# Patient Record
Sex: Male | Born: 1950 | Race: White | Hispanic: No | Smoking: Current every day smoker
Health system: Southern US, Community
[De-identification: ages and names within clinical notes are randomized; demographics above are authoritative.]

## PROBLEM LIST (undated history)

## (undated) DIAGNOSIS — R569 Unspecified convulsions: Secondary | ICD-10-CM

## (undated) DIAGNOSIS — I639 Cerebral infarction, unspecified: Secondary | ICD-10-CM

## (undated) DIAGNOSIS — I10 Essential (primary) hypertension: Secondary | ICD-10-CM

## (undated) DIAGNOSIS — Z9889 Other specified postprocedural states: Secondary | ICD-10-CM

## (undated) DIAGNOSIS — Z87442 Personal history of urinary calculi: Secondary | ICD-10-CM

## (undated) DIAGNOSIS — E119 Type 2 diabetes mellitus without complications: Secondary | ICD-10-CM

## (undated) DIAGNOSIS — E039 Hypothyroidism, unspecified: Secondary | ICD-10-CM

## (undated) DIAGNOSIS — A419 Sepsis, unspecified organism: Secondary | ICD-10-CM

## (undated) DIAGNOSIS — C801 Malignant (primary) neoplasm, unspecified: Secondary | ICD-10-CM

## (undated) DIAGNOSIS — J189 Pneumonia, unspecified organism: Secondary | ICD-10-CM

## (undated) DIAGNOSIS — E7800 Pure hypercholesterolemia, unspecified: Secondary | ICD-10-CM

## (undated) DIAGNOSIS — J449 Chronic obstructive pulmonary disease, unspecified: Secondary | ICD-10-CM

## (undated) DIAGNOSIS — K219 Gastro-esophageal reflux disease without esophagitis: Secondary | ICD-10-CM

## (undated) DIAGNOSIS — Z6841 Body Mass Index (BMI) 40.0 and over, adult: Secondary | ICD-10-CM

## (undated) DIAGNOSIS — F419 Anxiety disorder, unspecified: Secondary | ICD-10-CM

## (undated) HISTORY — PX: TONSILLECTOMY: SUR1361

## (undated) HISTORY — PX: TRACHEOSTOMY: SUR1362

## (undated) HISTORY — PX: GASTROSTOMY TUBE PLACEMENT: SHX655

## (undated) HISTORY — DX: Chronic obstructive pulmonary disease, unspecified: J44.9

## (undated) HISTORY — PX: REMOVAL OF GASTROSTOMY TUBE: SHX6058

## (undated) MED FILL — Dexamethasone Sodium Phosphate Inj 100 MG/10ML: INTRAMUSCULAR | Qty: 1 | Status: AC

---

## 2004-08-10 ENCOUNTER — Emergency Department: Payer: Self-pay | Admitting: Emergency Medicine

## 2004-08-11 ENCOUNTER — Ambulatory Visit: Payer: Self-pay | Admitting: Emergency Medicine

## 2005-05-02 DIAGNOSIS — I639 Cerebral infarction, unspecified: Secondary | ICD-10-CM

## 2005-05-02 HISTORY — DX: Cerebral infarction, unspecified: I63.9

## 2007-03-24 ENCOUNTER — Other Ambulatory Visit: Payer: Self-pay

## 2007-03-24 ENCOUNTER — Emergency Department: Payer: Self-pay | Admitting: Emergency Medicine

## 2009-07-19 ENCOUNTER — Inpatient Hospital Stay: Payer: Self-pay | Admitting: Internal Medicine

## 2013-05-02 DIAGNOSIS — C801 Malignant (primary) neoplasm, unspecified: Secondary | ICD-10-CM

## 2013-05-02 DIAGNOSIS — R569 Unspecified convulsions: Secondary | ICD-10-CM

## 2013-05-02 HISTORY — DX: Malignant (primary) neoplasm, unspecified: C80.1

## 2013-05-02 HISTORY — DX: Unspecified convulsions: R56.9

## 2013-05-02 HISTORY — PX: TONSILLECTOMY: SUR1361

## 2013-12-05 ENCOUNTER — Emergency Department: Payer: Self-pay | Admitting: Emergency Medicine

## 2013-12-05 LAB — CBC
HCT: 46.5 % (ref 40.0–52.0)
HGB: 15.2 g/dL (ref 13.0–18.0)
MCH: 31.6 pg (ref 26.0–34.0)
MCHC: 32.7 g/dL (ref 32.0–36.0)
MCV: 97 fL (ref 80–100)
Platelet: 208 10*3/uL (ref 150–440)
RBC: 4.82 10*6/uL (ref 4.40–5.90)
RDW: 13.4 % (ref 11.5–14.5)
WBC: 14.6 10*3/uL — ABNORMAL HIGH (ref 3.8–10.6)

## 2013-12-05 LAB — COMPREHENSIVE METABOLIC PANEL
Albumin: 3.4 g/dL (ref 3.4–5.0)
Alkaline Phosphatase: 80 U/L
Anion Gap: 4 — ABNORMAL LOW (ref 7–16)
BUN: 18 mg/dL (ref 7–18)
Bilirubin,Total: 0.5 mg/dL (ref 0.2–1.0)
Calcium, Total: 8.9 mg/dL (ref 8.5–10.1)
Chloride: 107 mmol/L (ref 98–107)
Co2: 28 mmol/L (ref 21–32)
Creatinine: 1.32 mg/dL — ABNORMAL HIGH (ref 0.60–1.30)
EGFR (African American): >60
EGFR (Non-African Amer.): 57 — ABNORMAL LOW
Glucose: 98 mg/dL (ref 65–99)
Osmolality: 279 (ref 275–301)
Potassium: 4.3 mmol/L (ref 3.5–5.1)
SGOT(AST): 24 U/L (ref 15–37)
SGPT (ALT): 27 U/L
Sodium: 139 mmol/L (ref 136–145)
Total Protein: 7.4 g/dL (ref 6.4–8.2)

## 2013-12-05 LAB — TROPONIN I: Troponin-I: <0.02 ng/mL

## 2014-03-14 ENCOUNTER — Ambulatory Visit: Payer: Self-pay | Admitting: Otolaryngology

## 2014-03-14 LAB — CREATININE, SERUM
Creatinine: 1.38 mg/dL — ABNORMAL HIGH (ref 0.60–1.30)
EGFR (African American): >60
EGFR (Non-African Amer.): 55 — ABNORMAL LOW

## 2014-03-21 ENCOUNTER — Ambulatory Visit: Payer: Self-pay | Admitting: Oncology

## 2014-04-01 ENCOUNTER — Ambulatory Visit: Payer: Self-pay | Admitting: Oncology

## 2014-04-07 ENCOUNTER — Ambulatory Visit: Payer: Self-pay | Admitting: Otolaryngology

## 2014-04-07 LAB — CBC WITH DIFFERENTIAL/PLATELET
Basophil #: 0 10*3/uL (ref 0.0–0.1)
Basophil %: 0.5 %
Eosinophil #: 0.3 10*3/uL (ref 0.0–0.7)
Eosinophil %: 3.2 %
HCT: 47.2 % (ref 40.0–52.0)
HGB: 16.1 g/dL (ref 13.0–18.0)
Lymphocyte #: 2.4 10*3/uL (ref 1.0–3.6)
Lymphocyte %: 23.4 %
MCH: 32.7 pg (ref 26.0–34.0)
MCHC: 34.1 g/dL (ref 32.0–36.0)
MCV: 96 fL (ref 80–100)
Monocyte #: 0.7 x10 3/mm (ref 0.2–1.0)
Monocyte %: 7 %
Neutrophil #: 6.7 10*3/uL — ABNORMAL HIGH (ref 1.4–6.5)
Neutrophil %: 65.9 %
Platelet: 210 10*3/uL (ref 150–440)
RBC: 4.91 10*6/uL (ref 4.40–5.90)
RDW: 13.4 % (ref 11.5–14.5)
WBC: 10.2 10*3/uL (ref 3.8–10.6)

## 2014-04-07 LAB — BASIC METABOLIC PANEL
Anion Gap: 8 (ref 7–16)
BUN: 17 mg/dL (ref 7–18)
Calcium, Total: 8.9 mg/dL (ref 8.5–10.1)
Chloride: 107 mmol/L (ref 98–107)
Co2: 27 mmol/L (ref 21–32)
Creatinine: 1.36 mg/dL — ABNORMAL HIGH (ref 0.60–1.30)
EGFR (African American): >60
EGFR (Non-African Amer.): 56 — ABNORMAL LOW
Glucose: 122 mg/dL — ABNORMAL HIGH (ref 65–99)
Osmolality: 286 (ref 275–301)
Potassium: 3.9 mmol/L (ref 3.5–5.1)
Sodium: 142 mmol/L (ref 136–145)

## 2014-04-09 ENCOUNTER — Ambulatory Visit: Payer: Self-pay | Admitting: Otolaryngology

## 2014-05-02 ENCOUNTER — Ambulatory Visit: Payer: Self-pay | Admitting: Oncology

## 2014-06-02 ENCOUNTER — Ambulatory Visit: Payer: Self-pay | Admitting: Oncology

## 2014-07-01 ENCOUNTER — Ambulatory Visit
Admit: 2014-07-01 | Disposition: A | Discharge status: Home / Self Care | Payer: Self-pay | Attending: Oncology | Admitting: Oncology

## 2014-07-24 LAB — COMPREHENSIVE METABOLIC PANEL
Albumin: 4 g/dL
Alkaline Phosphatase: 79 U/L
Anion Gap: 6 — ABNORMAL LOW (ref 7–16)
BUN: 14 mg/dL
Bilirubin,Total: 0.9 mg/dL
Calcium, Total: 9.2 mg/dL
Chloride: 102 mmol/L
Co2: 28 mmol/L
Creatinine: 1.1 mg/dL
EGFR (African American): >60
EGFR (Non-African Amer.): >60
Glucose: 111 mg/dL — ABNORMAL HIGH
Potassium: 4.3 mmol/L
SGOT(AST): 18 U/L
SGPT (ALT): 19 U/L
Sodium: 136 mmol/L
Total Protein: 6.9 g/dL

## 2014-07-24 LAB — CBC CANCER CENTER
Basophil #: 0.1 x10 3/mm (ref 0.0–0.1)
Basophil %: 0.6 %
Eosinophil #: 0.2 x10 3/mm (ref 0.0–0.7)
Eosinophil %: 2 %
HCT: 44.1 % (ref 40.0–52.0)
HGB: 15.2 g/dL (ref 13.0–18.0)
Lymphocyte #: 1 x10 3/mm (ref 1.0–3.6)
Lymphocyte %: 8 %
MCH: 32.3 pg (ref 26.0–34.0)
MCHC: 34.4 g/dL (ref 32.0–36.0)
MCV: 94 fL (ref 80–100)
Monocyte #: 1.2 x10 3/mm — ABNORMAL HIGH (ref 0.2–1.0)
Monocyte %: 9.6 %
Neutrophil #: 9.6 x10 3/mm — ABNORMAL HIGH (ref 1.4–6.5)
Neutrophil %: 79.8 %
Platelet: 181 x10 3/mm (ref 150–440)
RBC: 4.7 10*6/uL (ref 4.40–5.90)
RDW: 13.8 % (ref 11.5–14.5)
WBC: 12 x10 3/mm — ABNORMAL HIGH (ref 3.8–10.6)

## 2014-07-31 LAB — CBC CANCER CENTER
Basophil #: 0.1 x10 3/mm (ref 0.0–0.1)
Basophil %: 0.6 %
Eosinophil #: 0.2 x10 3/mm (ref 0.0–0.7)
Eosinophil %: 1.8 %
HCT: 43.2 % (ref 40.0–52.0)
HGB: 14.9 g/dL (ref 13.0–18.0)
Lymphocyte #: 0.9 x10 3/mm — ABNORMAL LOW (ref 1.0–3.6)
Lymphocyte %: 8.2 %
MCH: 32.8 pg (ref 26.0–34.0)
MCHC: 34.6 g/dL (ref 32.0–36.0)
MCV: 95 fL (ref 80–100)
Monocyte #: 1 x10 3/mm (ref 0.2–1.0)
Monocyte %: 9.1 %
Neutrophil #: 8.9 x10 3/mm — ABNORMAL HIGH (ref 1.4–6.5)
Neutrophil %: 80.3 %
Platelet: 159 x10 3/mm (ref 150–440)
RBC: 4.55 10*6/uL (ref 4.40–5.90)
RDW: 14.9 % — ABNORMAL HIGH (ref 11.5–14.5)
WBC: 11.1 x10 3/mm — ABNORMAL HIGH (ref 3.8–10.6)

## 2014-07-31 LAB — COMPREHENSIVE METABOLIC PANEL
Albumin: 3.7 g/dL
Alkaline Phosphatase: 78 U/L
Anion Gap: 5 — ABNORMAL LOW (ref 7–16)
BUN: 18 mg/dL
Bilirubin,Total: 0.9 mg/dL
Calcium, Total: 8.9 mg/dL
Chloride: 102 mmol/L
Co2: 28 mmol/L
Creatinine: 1.1 mg/dL
EGFR (African American): >60
EGFR (Non-African Amer.): >60
Glucose: 117 mg/dL — ABNORMAL HIGH
Potassium: 4.1 mmol/L
SGOT(AST): 16 U/L
SGPT (ALT): 17 U/L
Sodium: 135 mmol/L
Total Protein: 6.6 g/dL

## 2014-07-31 LAB — CREATININE, SERUM: Creatine, Serum: 1.1

## 2014-08-01 ENCOUNTER — Ambulatory Visit
Admit: 2014-08-01 | Disposition: A | Discharge status: Home / Self Care | Payer: Self-pay | Attending: Oncology | Admitting: Oncology

## 2014-08-05 DIAGNOSIS — I159 Secondary hypertension, unspecified: Secondary | ICD-10-CM

## 2014-08-05 DIAGNOSIS — E785 Hyperlipidemia, unspecified: Secondary | ICD-10-CM | POA: Insufficient documentation

## 2014-08-05 DIAGNOSIS — J449 Chronic obstructive pulmonary disease, unspecified: Secondary | ICD-10-CM

## 2014-08-05 DIAGNOSIS — I10 Essential (primary) hypertension: Secondary | ICD-10-CM | POA: Insufficient documentation

## 2014-08-05 DIAGNOSIS — C099 Malignant neoplasm of tonsil, unspecified: Secondary | ICD-10-CM

## 2014-08-07 LAB — COMPREHENSIVE METABOLIC PANEL
Albumin: 3.7 g/dL
Alkaline Phosphatase: 83 U/L
Anion Gap: 8 (ref 7–16)
BUN: 18 mg/dL
Bilirubin,Total: 0.8 mg/dL
Calcium, Total: 9.1 mg/dL
Chloride: 102 mmol/L
Co2: 26 mmol/L
Creatinine: 1.09 mg/dL
EGFR (African American): >60
EGFR (Non-African Amer.): >60
Glucose: 105 mg/dL — ABNORMAL HIGH
Potassium: 4.5 mmol/L
SGOT(AST): 17 U/L
SGPT (ALT): 16 U/L — ABNORMAL LOW
Sodium: 136 mmol/L
Total Protein: 6.9 g/dL

## 2014-08-07 LAB — CBC CANCER CENTER
Basophil #: 0 x10 3/mm (ref 0.0–0.1)
Basophil %: 0.5 %
Eosinophil #: 0.1 x10 3/mm (ref 0.0–0.7)
Eosinophil %: 1.2 %
HCT: 42.4 % (ref 40.0–52.0)
HGB: 14.9 g/dL (ref 13.0–18.0)
Lymphocyte #: 0.8 x10 3/mm — ABNORMAL LOW (ref 1.0–3.6)
Lymphocyte %: 8.2 %
MCH: 33.2 pg (ref 26.0–34.0)
MCHC: 35.2 g/dL (ref 32.0–36.0)
MCV: 95 fL (ref 80–100)
Monocyte #: 0.9 x10 3/mm (ref 0.2–1.0)
Monocyte %: 9.2 %
Neutrophil #: 7.8 x10 3/mm — ABNORMAL HIGH (ref 1.4–6.5)
Neutrophil %: 80.9 %
Platelet: 157 x10 3/mm (ref 150–440)
RBC: 4.49 10*6/uL (ref 4.40–5.90)
RDW: 15.4 % — ABNORMAL HIGH (ref 11.5–14.5)
WBC: 9.7 x10 3/mm (ref 3.8–10.6)

## 2014-08-14 LAB — COMPREHENSIVE METABOLIC PANEL
Albumin: 4 g/dL
Alkaline Phosphatase: 93 U/L
Anion Gap: 7 (ref 7–16)
BUN: 16 mg/dL
Bilirubin,Total: 1.1 mg/dL
Calcium, Total: 9.3 mg/dL
Chloride: 101 mmol/L
Co2: 29 mmol/L
Creatinine: 1.05 mg/dL
EGFR (African American): >60
EGFR (Non-African Amer.): >60
Glucose: 114 mg/dL — ABNORMAL HIGH
Potassium: 4.1 mmol/L
SGOT(AST): 19 U/L
SGPT (ALT): 16 U/L — ABNORMAL LOW
Sodium: 137 mmol/L
Total Protein: 7.1 g/dL

## 2014-08-14 LAB — CBC CANCER CENTER
Basophil #: 0 x10 3/mm (ref 0.0–0.1)
Basophil %: 0.4 %
Eosinophil #: 0.1 x10 3/mm (ref 0.0–0.7)
Eosinophil %: 1.6 %
HCT: 44.7 % (ref 40.0–52.0)
HGB: 15.6 g/dL (ref 13.0–18.0)
Lymphocyte #: 0.6 x10 3/mm — ABNORMAL LOW (ref 1.0–3.6)
Lymphocyte %: 8.7 %
MCH: 33.7 pg (ref 26.0–34.0)
MCHC: 35 g/dL (ref 32.0–36.0)
MCV: 96 fL (ref 80–100)
Monocyte #: 0.6 x10 3/mm (ref 0.2–1.0)
Monocyte %: 7.8 %
Neutrophil #: 6 x10 3/mm (ref 1.4–6.5)
Neutrophil %: 81.5 %
Platelet: 127 x10 3/mm — ABNORMAL LOW (ref 150–440)
RBC: 4.63 10*6/uL (ref 4.40–5.90)
RDW: 16.4 % — ABNORMAL HIGH (ref 11.5–14.5)
WBC: 7.3 x10 3/mm (ref 3.8–10.6)

## 2014-08-21 LAB — CBC CANCER CENTER
Basophil #: 0 x10 3/mm (ref 0.0–0.1)
Basophil %: 0.6 %
Eosinophil #: 0.1 x10 3/mm (ref 0.0–0.7)
Eosinophil %: 2.1 %
HCT: 42.1 % (ref 40.0–52.0)
HGB: 14.7 g/dL (ref 13.0–18.0)
Lymphocyte #: 0.6 x10 3/mm — ABNORMAL LOW (ref 1.0–3.6)
Lymphocyte %: 8.4 %
MCH: 33.7 pg (ref 26.0–34.0)
MCHC: 35 g/dL (ref 32.0–36.0)
MCV: 96 fL (ref 80–100)
Monocyte #: 0.5 x10 3/mm (ref 0.2–1.0)
Monocyte %: 6.8 %
Neutrophil #: 5.5 x10 3/mm (ref 1.4–6.5)
Neutrophil %: 82.1 %
Platelet: 130 x10 3/mm — ABNORMAL LOW (ref 150–440)
RBC: 4.37 10*6/uL — ABNORMAL LOW (ref 4.40–5.90)
RDW: 17 % — ABNORMAL HIGH (ref 11.5–14.5)
WBC: 6.7 x10 3/mm (ref 3.8–10.6)

## 2014-08-21 LAB — COMPREHENSIVE METABOLIC PANEL
Albumin: 3.7 g/dL
Alkaline Phosphatase: 79 U/L
Anion Gap: 8 (ref 7–16)
BUN: 17 mg/dL
Bilirubin,Total: 1 mg/dL
Calcium, Total: 9.5 mg/dL
Chloride: 102 mmol/L
Co2: 27 mmol/L
Creatinine: 1.07 mg/dL
EGFR (African American): >60
EGFR (Non-African Amer.): >60
Glucose: 153 mg/dL — ABNORMAL HIGH
Potassium: 3.8 mmol/L
SGOT(AST): 20 U/L
SGPT (ALT): 16 U/L — ABNORMAL LOW
Sodium: 137 mmol/L
Total Protein: 6.9 g/dL

## 2014-08-23 NOTE — Op Note (Signed)
PATIENT NAME:  Shawn Lindsey, Shawn Lindsey MR#:  [id number] DATE OF BIRTH:  1950-10-11  DATE OF PROCEDURE:  04/09/2014  PREOPERATIVE DIAGNOSIS: Left tonsillar neoplasm.   POSTOPERATIVE DIAGNOSIS:  Left tonsillar neoplasm.  PROCEDURE: Left tonsillectomy.   SURGEON: Malon Kindle, M.D.   ANESTHESIA: General endotracheal.   INDICATIONS: A 64 year old male with an exophytic mass involving the left tonsillar region.   FINDINGS: This was a nearly 3 cm exophytic papillary-appearing mass involving the left anterior tonsillar pillar extending down towards the base of the tongue with possible involvement of the associated tonsil itself. The lesion was excised with a cuff of anterior tonsillar pillar musculature. The tonsil was excised separately. No other abnormalities were seen. He had some  enhancement in the right tonsillar region on PET scan, but the tonsil was normal on that side, just evidence of tonsillolithiasis and cryptic tonsils.   COMPLICATIONS: None.   DESCRIPTION OF PROCEDURE: After obtaining informed consent, the patient was taken to the operating room and placed in the supine position. After induction of general endotracheal anesthesia, the patient was turned 90 degrees. The head was draped with the eyes protected. A McIvor retractor was used to open the mouth. The oropharynx was inspected. The patient had an obvious lesion involving predominantly left anterior tonsillar pillar, although it extended down to the left tongue base region. The tonsils themselves were cryptic with some tonsillolith debris noted but no mucosal lesions were seen on the right.   The lesion was grasped and actually excised away from the tonsil itself separately as it appeared to be attached more to the anterior tonsillar pillar, although it did have some attachment inferiorly down towards the base of the tongue, which was fairly vascular. Bleeding was stopped with the suction cautery. This lesion of the anterior tonsillar  pillar was sent separately. The left tonsil was then resected with the Bovie with minimal bleeding encountered and sent as a separate specimen.   The throat was suctioned to remove any blood clot. Marcaine 0.25% with epinephrine 1:200,000 was injected into the left tonsillar fossa. The patient was then returned to the anesthesiologist for awakening. He was awakened and taken to the recovery room in good condition postoperatively.     ____________________________ Sammuel Hines. Richardson Landry, MD psb:kl D: 04/09/2014 11:13:42 ET T: 04/09/2014 13:33:04 ET JOB#: [id number]  cc: Sammuel Hines. Richardson Landry, MD, <Dictator> Riley Nearing MD ELECTRONICALLY SIGNED 04/20/2014 13:57

## 2014-08-25 LAB — SURGICAL PATHOLOGY

## 2014-08-28 ENCOUNTER — Other Ambulatory Visit: Payer: Self-pay | Admitting: Oncology

## 2014-08-28 DIAGNOSIS — C099 Malignant neoplasm of tonsil, unspecified: Secondary | ICD-10-CM

## 2014-08-28 LAB — COMPREHENSIVE METABOLIC PANEL
Albumin: 4 g/dL
Alkaline Phosphatase: 80 U/L
Anion Gap: 8 (ref 7–16)
BUN: 21 mg/dL — ABNORMAL HIGH
Bilirubin,Total: 0.9 mg/dL
Calcium, Total: 9.6 mg/dL
Chloride: 101 mmol/L
Co2: 28 mmol/L
Creatinine: 1.3 mg/dL — ABNORMAL HIGH
EGFR (African American): >60
EGFR (Non-African Amer.): 58 — ABNORMAL LOW
Glucose: 121 mg/dL — ABNORMAL HIGH
Potassium: 3.5 mmol/L
SGOT(AST): 18 U/L
SGPT (ALT): 17 U/L
Sodium: 137 mmol/L
Total Protein: 7.1 g/dL

## 2014-08-28 LAB — CBC CANCER CENTER
Basophil #: 0 x10 3/mm (ref 0.0–0.1)
Basophil %: 0.3 %
Eosinophil #: 0.1 x10 3/mm (ref 0.0–0.7)
Eosinophil %: 1.6 %
HCT: 42.3 % (ref 40.0–52.0)
HGB: 14.9 g/dL (ref 13.0–18.0)
Lymphocyte #: 0.8 x10 3/mm — ABNORMAL LOW (ref 1.0–3.6)
Lymphocyte %: 11.7 %
MCH: 34.2 pg — ABNORMAL HIGH (ref 26.0–34.0)
MCHC: 35.3 g/dL (ref 32.0–36.0)
MCV: 97 fL (ref 80–100)
Monocyte #: 0.6 x10 3/mm (ref 0.2–1.0)
Monocyte %: 8.8 %
Neutrophil #: 5.4 x10 3/mm (ref 1.4–6.5)
Neutrophil %: 77.6 %
Platelet: 141 x10 3/mm — ABNORMAL LOW (ref 150–440)
RBC: 4.36 10*6/uL — ABNORMAL LOW (ref 4.40–5.90)
RDW: 17.4 % — ABNORMAL HIGH (ref 11.5–14.5)
WBC: 7 x10 3/mm (ref 3.8–10.6)

## 2014-08-31 NOTE — Consult Note (Signed)
Reason for Visit: This 64 year old Male patient presents to the clinic for initial evaluation of  head and neck cancer .   Referred by Dr. Grayland Ormond.  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  Pathology Report pathology report reviewed   Imaging Report CT scans and PET/CT scans reviewed   Referral Report clinical notes reviewed   Planned Treatment Regimen concurrent chemoradiation   HPI   patient is a 64 year old male noted to have a sore throat and enlarging left tonsillar lesion over the past several months. He had a biopsy which showed dysplasia and no malignancy. Part of his workup included a CT scan showing a right lower lobe lung nodule suspicious. He eventually underwent a left tonsillectomy showing papillary squamous cell carcinoma with mucosal margin positive with noninvasive component deep margin negative. PET/CT scan demonstrated asymmetric hypermetabolic activity in both tonsillar pillars left greater than the right with several hypermetabolic lymph nodes at level II measuring up to 1 cm with SUV maximum of 7.9. Patient also had hypermetabolic activity in the right lower lobe lung lesion.after antibiotic therapy and repeat CT scan there was marked resolution of the right lower lobe lung lesion. Patient has been seen by medical oncology and recommendation for concurrent chemoradiation based on stage II disease was made. He is seen today for radiation oncology evaluation. He is having no head and neck pain or dysphagia at this time.  Past Hx:    high cholesterol:    htn:    denies:   Past, Family and Social History:  Past Medical History positive   Cardiovascular hyperlipidemia; hypertension   Respiratory COPD   Family History positive   Family History Comments family history  positive for congestive heart failure, hypertension and hyperlipidemia   Social History positive   Social History Comments greater than 50-pack-year smoking history quit in August 2015 note EtOH use history   Additional Past Medical and Surgical History accompanied by family member today   Allergies:   No Known Allergies:   Home Meds:  Home Medications: Medication Instructions Status  omeprazole 20 mg oral delayed release capsule 1 cap(s) orally once a day Active  Synthroid 125 mcg (0.125 mg) oral tablet 1 tab(s) orally once a day Active  Spiriva 18 mcg inhalation capsule 1 each inhaled once a day Active  Keppra 500 mg oral tablet 1 tab(s) orally 2 times a day Active  amLODIPine 10 mg oral tablet 0.5 tab(s) orally once a day Active  losartan 100 mg oral tablet 1 tab(s) orally once a day (at bedtime) Active  Aspirin Low Dose 81 mg oral delayed release tablet 1 tab(s) orally once a day Active  ibuprofen 200 mg oral tablet 2 tab(s) orally once a day (in the morning) Active  magnesium oxide 1 tab(s) orally 2 times a day Active  Vitamin B-12 1 tab(s) orally once a day Active  gabapentin 100 mg oral capsule 4 cap(s) orally once a day (in the evening) Active  Lipitor 80 mg oral tablet 1 tab(s) orally once a day (in the morning) Active  cholecalciferol 2000 intl units oral capsule 1 cap(s) orally once a day Active  FLUoxetine 10 mg oral capsule 1 cap(s) orally once a day Active   Review of Systems:  General negative   Performance Status (ECOG) 0   Skin negative   Breast negative  Ophthalmologic negative   ENMT see HPI   Respiratory and Thorax negative   Cardiovascular negative   Gastrointestinal negative   Genitourinary negative   Musculoskeletal negative   Neurological negative   Psychiatric negative   Hematology/Lymphatics negative   Endocrine negative   Allergic/Immunologic negative   Review of Systems   denies any weight loss, fatigue, weakness, fever,  chills or night sweats. Patient denies any loss of vision, blurred vision. Patient denies any ringing  of the ears or hearing loss. No irregular heartbeat. Patient denies heart murmur or history of fainting. Patient denies any chest pain or pain radiating to her upper extremities. Patient denies any shortness of breath, difficulty breathing at night, cough or hemoptysis. Patient denies any swelling in the lower legs. Patient denies any nausea vomiting, vomiting of blood, or coffee ground material in the vomitus. Patient denies any stomach pain. Patient states has had normal bowel movements no significant constipation or diarrhea. Patient denies any dysuria, hematuria or significant nocturia. Patient denies any problems walking, swelling in the joints or loss of balance. Patient denies any skin changes, loss of hair or loss of weight. Patient denies any excessive worrying or anxiety or significant depression. Patient denies any problems with insomnia. Patient denies excessive thirst, polyuria, polydipsia. Patient denies any swollen glands, patient denies easy bruising or easy bleeding. Patient denies any recent infections, allergies or URI. Patient "s visual fields have not changed significantly in recent time.   Nursing Notes:  Nursing Vital Signs and Chemo Nursing Nursing Notes: *CC Vital Signs Flowsheet:   20-Jan-16 14:21  Temp Temperature 96.9  Pulse Pulse 68  Respirations Respirations 18  SBP SBP 130  DBP DBP 81  Pain Scale (0-10)  0  Current Weight (kg) (kg) 129.8  Height (cm) centimeters 174  BSA (m2) 2.3   Physical Exam:  General/Skin/HEENT:  General normal   Skin normal   Eyes normal   Additional PE well-developed obese male uses a walker for assistance. Teeth are in a poor state of repair. Oral cavity shows recent left tonsillectomy healing well. No oral mucosal lesions are identified. Indirect mirror examination shows upper airway clear vallecula and base of tongue within normal  limits. Neck is clear without evidence of sub-digastric cervical or supraclavicular adenopathy. Lungs are clear to A&P cardiac examination shows regular rate and rhythm.   Breasts/Resp/CV/GI/GU:  Respiratory and Thorax normal   Cardiovascular normal   Gastrointestinal normal   Genitourinary normal   MS/Neuro/Psych/Lymph:  Musculoskeletal normal   Neurological normal   Lymphatics normal   Other Results:  Radiology Results: CT:    11-Jan-16 09:27, CT Chest With Contrast  CT Chest With Contrast   REASON FOR EXAM:    LABS FIRST   Assess Interval Change of Pulmonary   Nodule  COMMENTS:       PROCEDURE: KCT - KCT CHEST WITH CONTRAST  - May 12 2014  9:27AM     CLINICAL DATA:  Left tonsillar cancer with resection in December,  2015. Tobacco use. There is a right upper lobe opacity on prior  imaging, thought to potentially be inflammatory or neoplastic, for  which short-term followup imaging was suggested.    EXAM:  CT CHEST WITH CONTRAST    TECHNIQUE:  Multidetector CT imaging of the chest wasperformed during  intravenous contrast administration.    CONTRAST:  75 cc Isovue 300    COMPARISON:  04/01/2014    FINDINGS:  Atherosclerosis of the aortic arch and branch vessels. Coronary  atherosclerosis.  No cardiomegaly.    Small type 1 hiatal hernia. Hepatic steatosis. 1.2 cm gallstone in  the gallbladder.    Severe emphysema.  Posteriorly in the left upper lobe, the prior airspace opacities  considerably improved, with interval re-aeration of a significant  part of the previously opacifiedregion. The continues to be  irregular bandlike opacity in this vicinity with some areas of  nodularity including a 0.8 by 1.1 cm area of nodularity superiorly  and a 0.7 cm wide region of nodularity along the lower portion which  was previously about 1.3 cm in thickness.    The left lung appears clear.    Thoracic spondylosis. Trace fluid in the superior pericardial  recesses.      IMPRESSION:  1. Improved but not resolved right upper lobe opacity with residual  irregular bandlike opacity and some residual nodularity. This may  reflect underlying neoplasm with prior postobstructive pneumonitis  which has cleared, or simply residual scarring/inflammation which  might be slow to clear due to the underlying severe emphysema.  Region was previously hypermetabolic, and there has been abnormality  in this vicinity for at least 2 months. Despite the increased risk  of pneumothorax related to the emphysema, biopsy may be required for  definitive characterization.  2. Chololithiasis.  3. Atherosclerosis.  4. Severe emphysema.      Electronically Signed    By: Sherryl Barters M.D.    On: 05/12/2014 09:45         Verified By: Carron Curie, M.D.,  Nuclear Med:    01-Dec-15 11:05, PET/CT Scan Head/Neck CA Diagnosis  PET/CT Scan Head/Neck CA Diagnosis   REASON FOR EXAM:    Suspicious pulmonary nodules LT tonsillar lesion  COMMENTS:       PROCEDURE: PET - PET/CT DX HEAD/NECK CA  - Apr 01 2014 11:05AM     CLINICAL DATA:  Initial treatment strategy for left tonsillar lesion  and right lung nodule.    EXAM:  NUCLEAR MEDICINE PET SKULL BASE TO THIGH    TECHNIQUE:  12.3 mCi F-18 FDG was injected intravenously. Full-ring PET imaging  was performed from the skull base to thigh after the radiotracer. CT  data was obtained and used for attenuation correction and anatomic  localization.    FASTING BLOOD GLUCOSE:  Value: 103 mg/dl    COMPARISON:  Neck CT on 03/14/2014    FINDINGS:  NECK    Asymmetric hypermetabolic activity is seen in both palatine tonsils,  with SUV max of 9.8 on the left and 6.3 on the right. This  Corresponds with asymmetric soft tissue prominence in the left  tonsillar region on CT.    Several left-sided level 2 cervical lymph nodes measuring up to 10  mm are seen which are hypermetabolic, with SUV max of 7.9. No  other  hypermetabolic cervical lymph nodes identified.    CHEST    No hypermetabolic mediastinal or hilar nodes.    A bilobed subpleural masslike opacity with irregular margins is seen  in the posterior right upper lobe, which contains a few central  cystic foci, and measures 4.1 cm in maximum diameter on image 124 of  series 4. This is also hypermetabolic with SUV max of 8.5. This is  indeterminate and could be inflammatory or neoplastic in etiology.  Mild emphysema again noted.    ABDOMEN/PELVIS  No abnormal hypermetabolic activity within the liver, pancreas,  adrenal glands, or spleen. No hypermetabolic lymph nodes in the  abdomen or pelvis. Small calcified  gallstone incidentally noted.    SKELETON    No focal hypermetabolic activity to suggest skeletal metastasis.     IMPRESSION:  Asymmetric hypermetabolic activity in both tonsillar pillars, left  side greater than right, corresponding with asymmetric soft tissue  prominence. Direct visualization is recommended.    Several hypermetabolic left level 2cervical lymph nodes measuring  up to 10 mm. This is also nonspecific and could be inflammatory or  metastatic in etiology.    4 cm subpleural masslike opacity in the posterior right upper lobe,  which is hypermetabolic. This is indeterminate and could be  inflammatory or neoplastic in etiology. Consider percutaneous needle  biopsy versus short-term followup by CT.    No evidence of malignancy within the abdomen or pelvis.    Cholelithiasis incidentally noted.      Electronically Signed    By: Earle Gell M.D.    On: 04/01/2014 13:10     Verified By: Marlaine Hind, M.D.,   Relevent Results:   Relevant Scans and Labs PET/CT scans and CT scans reviewed   Assessment and Plan: Impression:   stage II (T2 N1 M0), cell carcinoma left tonsillar pillar status post resection in 64 year old male Plan:   this time like to present his case at her weekly tumor conference.  Certainly the initial pathology of his tonsillar lesion would be unusual for a neck node metastasis although it is pretty convincing by his PET CT scan he does have hypermetabolic most likely positive cervical nodes. I believe we should start planning forward for concurrent chemoradiation with curative intent. I would like him to see 8 oral surgeon prior to treatment for teeth extraction based on her poor condition. I will then plan on delivering 7000 cGy using IM RT treatment planning and delivery to his head and neck region treating the remainder of his neck nodes to 5400 cGy using IM RT dose painting technique. Risks and benefits of treatment including possible xerostomia, dysphasia, alteration of blood counts, oral mucositis, skin reaction, alteration of blood counts, all were discussed in detail with the patient. He seems to copperhead my treatment plan well.  I would like to take this opportunity for allowing me to participate in the care of your patient..  Fax to Physician:  Physicians To Recieve Fax: Riley Nearing, MD - [phone].  Electronic Signatures: Armstead Peaks (MD)  (Signed 20-Jan-16 15:24)  Authored: HPI, Diagnosis, Past Hx, PFSH, Allergies, Home Meds, ROS, Nursing Notes, Physical Exam, Other Results, Relevent Results, Encounter Assessment and Plan, Fax to Physician   Last Updated: 20-Jan-16 15:24 by Armstead Peaks (MD)

## 2014-10-08 ENCOUNTER — Encounter: Payer: Self-pay | Admitting: Radiation Oncology

## 2014-10-08 ENCOUNTER — Ambulatory Visit
Admission: RE | Admit: 2014-10-08 | Discharge: 2014-10-08 | Disposition: A | Discharge status: Home / Self Care | Payer: Medicare HMO | Source: Ambulatory Visit | Attending: Radiation Oncology | Admitting: Radiation Oncology

## 2014-10-08 ENCOUNTER — Inpatient Hospital Stay: Payer: Medicare HMO | Attending: Oncology

## 2014-10-08 VITALS — BP 123/82 | HR 79 | Temp 98.0°F | Resp 18 | Wt 267.7 lb

## 2014-10-08 DIAGNOSIS — C099 Malignant neoplasm of tonsil, unspecified: Secondary | ICD-10-CM

## 2014-10-08 DIAGNOSIS — Z8589 Personal history of malignant neoplasm of other organs and systems: Secondary | ICD-10-CM | POA: Insufficient documentation

## 2014-10-08 NOTE — Progress Notes (Signed)
Radiation Oncology Follow up Note  Name: Shawn Lindsey   Date:   10/08/2014 MRN:  [id number] DOB: Oct 25, 1950    This 64 y.o. male presents to the clinic today for follow-up head and neck cancer stage II (T2 N1 M0) squamous cell carcinoma the left tonsillar fossa.  REFERRING PROVIDER: Dr. Richardson Landry HPI: Patient is a 64 year old male now out over 6 months having completed combined modality treatment for a stage II squamous cell carcinoma of the left tonsillar fossa who underwent left tonsillectomy showing papillary squamous cell carcinoma with mucosal margin positive. PET CT demonstrated hypermetabolic activity in both tonsillar pillars also had hypermetabolic activity in the right lower lobe lung lesion with resolution of that lesion after antibiotically therapy. Also had cervical hypermetabolic nodes on PET CT criteria. He was treated with concurrent chemoradiation is now seen out 6 months and is doing extremely well. He specifically denies dysphagia head and neck pain or any new adenopathy in the head and neck region..  COMPLICATIONS OF TREATMENT: none  FOLLOW UP COMPLIANCE: keeps appointments   PHYSICAL EXAM:  BP 123/82 mmHg  Pulse 79  Temp(Src) 98 F (36.7 C)  Resp 18  Wt 267 lb 12 oz (121.45 kg) Oral cavity is clear with no oral mucosal lesions noted. He status post tonsillectomy. Indirect mirror examination shows vallecula and base of tongue within normal limits upper airway clear. Neck is clear without evidence of subject gastric cervical or supraclavicular adenopathy. Well-developed well-nourished patient in NAD. HEENT reveals PERLA, EOMI, discs not visualized.  Oral cavity is clear. No oral mucosal lesions are identified. Neck is clear without evidence of cervical or supraclavicular adenopathy. Lungs are clear to A&P. Cardiac examination is essentially unremarkable with regular rate and rhythm without murmur rub or thrill. Abdomen is benign with no organomegaly or masses noted. Motor  sensory and DTR levels are equal and symmetric in the upper and lower extremities. Cranial nerves II through XII are grossly intact. Proprioception is intact. No peripheral adenopathy or edema is identified. No motor or sensory levels are noted. Crude visual fields are within normal range.   RADIOLOGY RESULTS: Patient is scheduled for a PET CT scan later this month which I will review when available.  PLAN: At the present time he continues to do well with no evidence of disease. I've asked him to make a follow-up appointment with ENT for continued follow-up care. I have asked to see him back in 6 months for follow-up. He continues close follow-up care with medical oncology. Patient knows to call with any concerns.  I would like to take this opportunity for allowing me to participate in the care of your patient.Armstead Peaks., MD

## 2014-10-15 ENCOUNTER — Other Ambulatory Visit: Payer: Self-pay | Admitting: *Deleted

## 2014-10-15 DIAGNOSIS — C099 Malignant neoplasm of tonsil, unspecified: Secondary | ICD-10-CM

## 2014-10-21 ENCOUNTER — Ambulatory Visit
Admission: RE | Admit: 2014-10-21 | Discharge: 2014-10-21 | Disposition: A | Discharge status: Home / Self Care | Payer: Medicare HMO | Source: Ambulatory Visit | Attending: Oncology | Admitting: Oncology

## 2014-10-21 ENCOUNTER — Inpatient Hospital Stay: Payer: Medicare HMO

## 2014-10-21 DIAGNOSIS — Z8589 Personal history of malignant neoplasm of other organs and systems: Secondary | ICD-10-CM | POA: Diagnosis not present

## 2014-10-21 DIAGNOSIS — C099 Malignant neoplasm of tonsil, unspecified: Secondary | ICD-10-CM

## 2014-10-21 HISTORY — DX: Malignant (primary) neoplasm, unspecified: C80.1

## 2014-10-21 HISTORY — DX: Type 2 diabetes mellitus without complications: E11.9

## 2014-10-21 LAB — CBC WITH DIFFERENTIAL/PLATELET
Basophils Absolute: 0.1 10*3/uL (ref 0–0.1)
Basophils Relative: 1 %
Eosinophils Absolute: 0.2 10*3/uL (ref 0–0.7)
Eosinophils Relative: 2 %
HCT: 43.4 % (ref 40.0–52.0)
Hemoglobin: 15 g/dL (ref 13.0–18.0)
Lymphocytes Relative: 7 %
Lymphs Abs: 0.9 10*3/uL — ABNORMAL LOW (ref 1.0–3.6)
MCH: 34.2 pg — ABNORMAL HIGH (ref 26.0–34.0)
MCHC: 34.6 g/dL (ref 32.0–36.0)
MCV: 98.8 fL (ref 80.0–100.0)
Monocytes Absolute: 1 10*3/uL (ref 0.2–1.0)
Monocytes Relative: 9 %
Neutro Abs: 9.4 10*3/uL — ABNORMAL HIGH (ref 1.4–6.5)
Neutrophils Relative %: 81 %
Platelets: 164 10*3/uL (ref 150–440)
RBC: 4.4 MIL/uL (ref 4.40–5.90)
RDW: 13.5 % (ref 11.5–14.5)
WBC: 11.5 10*3/uL — ABNORMAL HIGH (ref 3.8–10.6)

## 2014-10-21 LAB — COMPREHENSIVE METABOLIC PANEL
ALT: 18 U/L (ref 17–63)
AST: 19 U/L (ref 15–41)
Albumin: 4 g/dL (ref 3.5–5.0)
Alkaline Phosphatase: 90 U/L (ref 38–126)
Anion gap: 9 (ref 5–15)
BUN: 21 mg/dL — ABNORMAL HIGH (ref 6–20)
CO2: 23 mmol/L (ref 22–32)
Calcium: 9.1 mg/dL (ref 8.9–10.3)
Chloride: 102 mmol/L (ref 101–111)
Creatinine, Ser: 1.13 mg/dL (ref 0.61–1.24)
GFR calc Af Amer: >60 mL/min (ref >60)
GFR calc non Af Amer: >60 mL/min (ref >60)
Glucose, Bld: 107 mg/dL — ABNORMAL HIGH (ref 65–99)
Potassium: 4.1 mmol/L (ref 3.5–5.1)
Sodium: 134 mmol/L — ABNORMAL LOW (ref 135–145)
Total Bilirubin: 0.5 mg/dL (ref 0.3–1.2)
Total Protein: 7.1 g/dL (ref 6.5–8.1)

## 2014-10-21 LAB — GLUCOSE, CAPILLARY: Glucose-Capillary: 102 mg/dL — ABNORMAL HIGH (ref 65–99)

## 2014-10-21 MED ORDER — FLUDEOXYGLUCOSE F - 18 (FDG) INJECTION
12.1900 | Freq: Once | INTRAVENOUS | Status: AC | PRN
Start: 2014-10-21 — End: 2014-10-21
  Administered 2014-10-21: 12.19 via INTRAVENOUS

## 2014-10-23 ENCOUNTER — Ambulatory Visit: Payer: Self-pay

## 2014-10-23 ENCOUNTER — Ambulatory Visit: Payer: Self-pay | Admitting: Oncology

## 2014-10-28 ENCOUNTER — Inpatient Hospital Stay (HOSPITAL_BASED_OUTPATIENT_CLINIC_OR_DEPARTMENT_OTHER): Payer: Medicare HMO | Admitting: Oncology

## 2014-10-28 VITALS — BP 111/75 | HR 81 | Temp 97.2°F | Resp 20 | Wt 263.7 lb

## 2014-10-28 DIAGNOSIS — C760 Malignant neoplasm of head, face and neck: Secondary | ICD-10-CM

## 2014-10-28 DIAGNOSIS — Z8589 Personal history of malignant neoplasm of other organs and systems: Secondary | ICD-10-CM

## 2014-10-28 NOTE — Progress Notes (Signed)
Patient here today for follow up regarding PET results.

## 2014-11-03 NOTE — Progress Notes (Signed)
[place]  [phone] [phone]  ID: Shawn Lindsey OB: Sep 26, 1950  MR#: [id number]  IRW#:[id number]  Patient Care Team: Pcp Not In System as PCP - General  CHIEF COMPLAINT:  Chief Complaint  Patient presents with  . Follow-up    PET results    INTERVAL HISTORY: Patient returns to clinic today for further evaluation and discussion of his imaging results. He currently feels well and is asymptomatic.  He denies any fevers. He has no neurologic complaints. He denies any chest pain or shortness of breath. He denies any nausea, vomiting, constipation, or diarrhea. He has no urinary complaints. Patient offers no specific complaints today.  REVIEW OF SYSTEMS:   Review of Systems  Constitutional: Negative.   HENT:       Patient denies dysphagia.  Respiratory: Negative.     As per HPI. Otherwise, a complete review of systems is negatve.  PAST MEDICAL HISTORY: Past Medical History  Diagnosis Date  . Cancer     tonsil  . Diabetes mellitus without complication     PAST SURGICAL HISTORY: No past surgical history on file.  FAMILY HISTORY No family history on file.     ADVANCED DIRECTIVES:    HEALTH MAINTENANCE: History  Substance Use Topics  . Smoking status: Not on file  . Smokeless tobacco: Not on file  . Alcohol Use: Not on file     Colonoscopy:  PAP:  Bone density:  Lipid panel:  Allergies  Allergen Reactions  . No Known Allergies     Current Outpatient Prescriptions  Medication Sig Dispense Refill  . amLODipine (NORVASC) 10 MG tablet Take 10 mg by mouth daily.    Marland Kitchen aspirin 81 MG tablet Take 81 mg by mouth daily.    Marland Kitchen atorvastatin (LIPITOR) 80 MG tablet Take 80 mg by mouth daily. In the morning    . Cholecalciferol 2000 UNITS CAPS Take by mouth daily.    Marland Kitchen FLUoxetine (PROZAC) 10 MG capsule Take 10 mg by mouth daily.    Marland Kitchen gabapentin (NEURONTIN) 100 MG capsule Take 400 mg by mouth daily. In the evening    .  ibuprofen (ADVIL,MOTRIN) 200 MG tablet Take 400 mg by mouth daily. In the morning    . levETIRAcetam (KEPPRA) 500 MG tablet Take 500 mg by mouth 2 (two) times daily.    Marland Kitchen levothyroxine (SYNTHROID, LEVOTHROID) 125 MCG tablet Take 125 mcg by mouth daily.    Marland Kitchen losartan (COZAAR) 100 MG tablet Take 100 mg by mouth at bedtime.    . magnesium oxide (MAG-OX) 400 MG tablet Take 400 mg by mouth 2 (two) times daily.    Marland Kitchen omeprazole (PRILOSEC) 20 MG capsule Take 20 mg by mouth daily.    . prochlorperazine (COMPAZINE) 10 MG tablet Take 10 mg by mouth every 6 (six) hours as needed for nausea or vomiting.    . tiotropium (SPIRIVA) 18 MCG inhalation capsule Place 18 mcg into inhaler and inhale daily.     No current facility-administered medications for this visit.    OBJECTIVE: Filed Vitals:   10/28/14 0920  BP: 111/75  Pulse: 81  Temp: 97.2 F (36.2 C)  Resp: 20     Body mass index is 39.5 kg/(m^2).    ECOG FS:0 - Asymptomatic  General: Well-developed, well-nourished, no acute distress. Eyes: Anicteric sclera. HEENT: Normocephalic, moist mucous membranes, clear oropharnyx. No palpable lymphadenopathy, thyroid midline without nodules. Lungs: Clear to auscultation bilaterally. Heart: Regular rate and rhythm. No rubs, murmurs, or  gallops. Abdomen: Soft, nontender, nondistended. No organomegaly noted, normoactive bowel sounds. Musculoskeletal: No edema, cyanosis, or clubbing. Neuro: Alert, answering all questions appropriately. Cranial nerves grossly intact. Skin: No rashes or petechiae noted. Psych: Normal affect.   LAB RESULTS:  Lab Results  Component Value Date   NA 134* 10/21/2014   K 4.1 10/21/2014   CL 102 10/21/2014   CO2 23 10/21/2014   GLUCOSE 107* 10/21/2014   BUN 21* 10/21/2014   CREATININE 1.13 10/21/2014   CALCIUM 9.1 10/21/2014   PROT 7.1 10/21/2014   ALBUMIN 4.0 10/21/2014   AST 19 10/21/2014   ALT 18 10/21/2014   ALKPHOS 90 10/21/2014   BILITOT 0.5 10/21/2014    GFRNONAA >60 10/21/2014   GFRAA >60 10/21/2014    Lab Results  Component Value Date   WBC 11.5* 10/21/2014   NEUTROABS 9.4* 10/21/2014   HGB 15.0 10/21/2014   HCT 43.4 10/21/2014   MCV 98.8 10/21/2014   PLT 164 10/21/2014     STUDIES: Nm Pet Image Restag (ps) Skull Base To Thigh  10/21/2014   CLINICAL DATA:  Subsequent treatment strategy for tonsillar cancer. Chemotherapy and radiation therapy completed in May 2016.  EXAM: NUCLEAR MEDICINE PET SKULL BASE TO THIGH  TECHNIQUE: 12.19 MCi F-18 FDG was injected intravenously. Full-ring PET imaging was performed from the skull base to thigh after the radiotracer. CT data was obtained and used for attenuation correction and anatomic localization.  FASTING BLOOD GLUCOSE:  Value: 102 mg/dl  COMPARISON:  PET-CT 04/01/2014.  FINDINGS: NECK  Previously noted hypermetabolism in the region of the tonsillar pillars is no longer identified. However, there is an 8 mm short axis level IIa lymph node on the left (image 69 of series 3) which is hypermetabolic (SUVmax = 5.6), concerning for local nodal metastases. Asymmetric volume loss in the right posterior temporal and occipital regions, likely related to prior cerebral infarction. Hypermetabolism diffusely throughout the thyroid gland (SUVmax = 5.9- 9.1), without dominant nodule.  CHEST  Previously described band-like area of architectural distortion in the posterior aspect of the right upper lobe appears very similar to the most recent prior examination, and demonstrates a central area of low-level hypermetabolism (SUVmax = 3.2). No other new suspicious appearing hypermetabolic pulmonary nodules or masses are noted. No hypermetabolic mediastinal or hilar nodes. Mild diffuse bronchial wall thickening with moderate centrilobular and mild paraseptal emphysema. Heart size is normal. There is no significant pericardial fluid, thickening or pericardial calcification. There is atherosclerosis of the thoracic aorta, the  great vessels of the mediastinum and the coronary arteries, including calcified atherosclerotic plaque in the left main, left anterior descending and right coronary arteries. No pathologically enlarged mediastinal or hilar lymph nodes. Please note that accurate exclusion of hilar adenopathy is limited on noncontrast CT scans. Esophagus is unremarkable in appearance.  ABDOMEN/PELVIS  No abnormal hypermetabolic activity within the liver, pancreas, adrenal glands, or spleen. No hypermetabolic lymph nodes in the abdomen or pelvis. Tiny focus of hypermetabolism (SUVmax = 5.9) at the anorectal junction. 1.3 cm calcified gallstone lying dependently in the gallbladder. No current findings to suggest an acute cholecystitis at this time. Atherosclerosis throughout the abdominal and pelvic vasculature, without evidence of aneurysm.  SKELETON  No focal hypermetabolic activity to suggest skeletal metastasis.  IMPRESSION: 1. Previously noted tonsillar hypermetabolism is no longer identified. However, there is a hypermetabolic borderline enlarged left level II lymph node, suspicious for a local nodal metastasis. 2. Band-like area of architectural distortion in the right upper lobe posteriorly again  noted, with some internal nodular hypermetabolism. Given the partial regression of this region following antibiotic therapy, this is favored to reflect an area of resolving infection, however, continued attention on followup studies is recommended to ensure complete resolution. 3. Nonspecific hypermetabolism at the anorectal junction. This could simply reflect proctitis or could be physiologic, however, correlation with physical examination and/or sigmoidoscopy is suggested in the near future to exclude the possibility of neoplasm. 4. Diffuse hypermetabolism throughout the thyroid gland. This could suggest underlying thyroiditis. 5. Additional incidental findings, as above.   Electronically Signed   By: Vinnie Langton M.D.   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 Head and neck cancer:  Imaging results as above reviewed independently and no obvious evidence of recurrence. Patient does have a new 8 mm lymph node on the left. After discussion at cancer conference, it was determined to monitor and repeat imaging in 3 months. Return to clinic in 3 months with laboratory work, repeat imaging, and further evaluation. 2. Dysphasia: Resolved. Continue Carafate as needed. 3. Pulmonary nodule: Imaging as above with improvement. Continue to monitor closely and repeat imaging in 4-6 months.  4. Thrombocytopenia: Resolved. 5. Elevated creatinine: Patient's creatinine is now within normal limits.    Patient expressed understanding and was in agreement with this plan. He also understands that He can call clinic at any time with any questions, concerns, or complaints.   Cancer of tonsil   Staging form: Pharynx - Oropharynx, AJCC 7th Edition     Clinical stage from 11/03/2014: Stage II (T2, N0, M0) - Signed by Lloyd Huger, MD on 11/03/2014   Lloyd Huger, MD   11/03/2014 3:07 PM

## 2014-11-12 ENCOUNTER — Inpatient Hospital Stay: Payer: Medicare HMO

## 2014-11-13 ENCOUNTER — Inpatient Hospital Stay: Payer: Medicare HMO | Attending: Oncology | Admitting: Oncology

## 2014-11-13 ENCOUNTER — Ambulatory Visit: Payer: Medicare HMO

## 2014-11-13 ENCOUNTER — Inpatient Hospital Stay: Payer: Medicare HMO | Admitting: Oncology

## 2014-11-13 VITALS — BP 104/72 | HR 83 | Temp 97.1°F | Resp 16 | Wt 263.0 lb

## 2014-11-13 DIAGNOSIS — Z79899 Other long term (current) drug therapy: Secondary | ICD-10-CM

## 2014-11-13 DIAGNOSIS — C760 Malignant neoplasm of head, face and neck: Secondary | ICD-10-CM

## 2014-11-13 DIAGNOSIS — E119 Type 2 diabetes mellitus without complications: Secondary | ICD-10-CM | POA: Diagnosis not present

## 2014-11-13 DIAGNOSIS — R911 Solitary pulmonary nodule: Secondary | ICD-10-CM | POA: Insufficient documentation

## 2014-11-13 DIAGNOSIS — C099 Malignant neoplasm of tonsil, unspecified: Secondary | ICD-10-CM | POA: Insufficient documentation

## 2014-11-13 NOTE — Progress Notes (Unsigned)
11/13/14- Survivorship visit completed.  Survivorship Care Plan reviewed and given to patient.  ASCO questions and Survivorship reviewed and given to patient along with resources offered at [facility] and encourage patient to utilized these services.  Patient verbalized understanding of SCP.

## 2014-11-29 NOTE — Progress Notes (Signed)
[place]  Telephone:(336) [phone] Fax:(336) [phone]  ID: Shawn Lindsey OB: 09/29/1950  MR#: [id number]  OEU#:[id number]  Patient Care Team: Pcp Not In System as PCP - General  CHIEF COMPLAINT:  Chief Complaint  Patient presents with  . Follow-up    survivorship     INTERVAL HISTORY: Patient returns to clinic today for further evaluation and his survivorship visit.  He currently feels well and is asymptomatic.  He denies any fevers. He has no neurologic complaints. He denies any chest pain or shortness of breath. He denies any nausea, vomiting, constipation, or diarrhea. He has no urinary complaints. Patient offers no specific complaints today.  REVIEW OF SYSTEMS:   Review of Systems  Constitutional: Negative.   HENT:       Patient denies dysphagia.  Respiratory: Negative.     As per HPI. Otherwise, a complete review of systems is negatve.  PAST MEDICAL HISTORY: Past Medical History  Diagnosis Date  . Cancer     tonsil  . Diabetes mellitus without complication     PAST SURGICAL HISTORY: No past surgical history on file.  FAMILY HISTORY No family history on file.     ADVANCED DIRECTIVES:    HEALTH MAINTENANCE: History  Substance Use Topics  . Smoking status: Not on file  . Smokeless tobacco: Not on file  . Alcohol Use: Not on file     Colonoscopy:  PAP:  Bone density:  Lipid panel:  Allergies  Allergen Reactions  . No Known Allergies     Current Outpatient Prescriptions  Medication Sig Dispense Refill  . amLODipine (NORVASC) 10 MG tablet Take 10 mg by mouth daily.    Marland Kitchen aspirin 81 MG tablet Take 81 mg by mouth daily.    Marland Kitchen atorvastatin (LIPITOR) 80 MG tablet Take 80 mg by mouth daily. In the morning    . Cholecalciferol 2000 UNITS CAPS Take by mouth daily.    Marland Kitchen FLUoxetine (PROZAC) 10 MG capsule Take 10 mg by mouth daily.    Marland Kitchen gabapentin (NEURONTIN) 100 MG capsule Take 400 mg by mouth daily. In the evening    . ibuprofen  (ADVIL,MOTRIN) 200 MG tablet Take 400 mg by mouth daily. In the morning    . levETIRAcetam (KEPPRA) 500 MG tablet Take 500 mg by mouth 2 (two) times daily.    Marland Kitchen levothyroxine (SYNTHROID, LEVOTHROID) 125 MCG tablet Take 125 mcg by mouth daily.    Marland Kitchen losartan (COZAAR) 100 MG tablet Take 100 mg by mouth at bedtime.    . magnesium oxide (MAG-OX) 400 MG tablet Take 400 mg by mouth 2 (two) times daily.    Marland Kitchen omeprazole (PRILOSEC) 20 MG capsule Take 20 mg by mouth daily.    . prochlorperazine (COMPAZINE) 10 MG tablet Take 10 mg by mouth every 6 (six) hours as needed for nausea or vomiting.    . tiotropium (SPIRIVA) 18 MCG inhalation capsule Place 18 mcg into inhaler and inhale daily.     No current facility-administered medications for this visit.    OBJECTIVE: Filed Vitals:   11/13/14 1734  BP: 104/72  Pulse: 83  Temp: 97.1 F (36.2 C)  Resp: 16     Body mass index is 39.4 kg/(m^2).    ECOG FS:0 - Asymptomatic  General: Well-developed, well-nourished, no acute distress. Eyes: Anicteric sclera. HEENT: Normocephalic, moist mucous membranes, clear oropharnyx. No palpable lymphadenopathy, thyroid midline without nodules. Lungs: Clear to auscultation bilaterally. Heart: Regular rate and rhythm. No rubs, murmurs, or gallops.  Abdomen: Soft, nontender, nondistended. No organomegaly noted, normoactive bowel sounds. Musculoskeletal: No edema, cyanosis, or clubbing. Neuro: Alert, answering all questions appropriately. Cranial nerves grossly intact. Skin: No rashes or petechiae noted. Psych: Normal affect.   LAB RESULTS:  Lab Results  Component Value Date   NA 134* 10/21/2014   K 4.1 10/21/2014   CL 102 10/21/2014   CO2 23 10/21/2014   GLUCOSE 107* 10/21/2014   BUN 21* 10/21/2014   CREATININE 1.13 10/21/2014   CALCIUM 9.1 10/21/2014   PROT 7.1 10/21/2014   ALBUMIN 4.0 10/21/2014   AST 19 10/21/2014   ALT 18 10/21/2014   ALKPHOS 90 10/21/2014   BILITOT 0.5 10/21/2014   GFRNONAA >60  10/21/2014   GFRAA >60 10/21/2014    Lab Results  Component Value Date   WBC 11.5* 10/21/2014   NEUTROABS 9.4* 10/21/2014   HGB 15.0 10/21/2014   HCT 43.4 10/21/2014   MCV 98.8 10/21/2014   PLT 164 10/21/2014     STUDIES: No results found.  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 Head and neck cancer:  Imaging results previously reviewed independently and no obvious evidence of recurrence. Patient does have a new 8 mm lymph node on the left. After discussion at cancer conference, it was determined to monitor and repeat imaging in 3 months. Return to clinic in as previously scheduled for laboratory work, repeat imaging, and further evaluation. 2. Dysphasia: Resolved. Continue Carafate as needed. 3. Pulmonary nodule: Imaging as above with improvement. Continue to monitor closely and repeat imaging in 4-6 months.  4.  Thrombocytopenia: Resolved. 5.  Elevated creatinine: Patient's creatinine is now within normal limits. 6.  Survivorship: Patient's long-term followup, potential side effects, as well as laboratory work and chemotherapy were all discussed at length and in detail with the patient.   Patient expressed understanding and was in agreement with this plan. He also understands that He can call clinic at any time with any questions, concerns, or complaints.   Cancer of tonsil   Staging form: Pharynx - Oropharynx, AJCC 7th Edition     Clinical stage from 11/03/2014: Stage II (T2, N0, M0) - Signed by Lloyd Huger, MD on 11/03/2014   Lloyd Huger, MD   11/29/2014 3:16 PM

## 2015-01-29 ENCOUNTER — Inpatient Hospital Stay: Payer: Medicare HMO

## 2015-01-29 ENCOUNTER — Ambulatory Visit: Payer: Medicare HMO

## 2015-02-03 ENCOUNTER — Ambulatory Visit: Payer: Medicare HMO | Admitting: Oncology

## 2015-03-10 ENCOUNTER — Other Ambulatory Visit: Payer: Self-pay | Admitting: Oncology

## 2015-03-12 ENCOUNTER — Ambulatory Visit
Admission: RE | Admit: 2015-03-12 | Discharge: 2015-03-12 | Disposition: A | Discharge status: Home / Self Care | Payer: Medicare HMO | Source: Ambulatory Visit | Attending: Oncology | Admitting: Oncology

## 2015-03-12 ENCOUNTER — Inpatient Hospital Stay: Payer: Medicare HMO | Attending: Oncology

## 2015-03-12 DIAGNOSIS — R918 Other nonspecific abnormal finding of lung field: Secondary | ICD-10-CM | POA: Insufficient documentation

## 2015-03-12 DIAGNOSIS — Z7982 Long term (current) use of aspirin: Secondary | ICD-10-CM | POA: Diagnosis not present

## 2015-03-12 DIAGNOSIS — C099 Malignant neoplasm of tonsil, unspecified: Secondary | ICD-10-CM | POA: Diagnosis not present

## 2015-03-12 DIAGNOSIS — I700 Atherosclerosis of aorta: Secondary | ICD-10-CM | POA: Insufficient documentation

## 2015-03-12 DIAGNOSIS — Z79899 Other long term (current) drug therapy: Secondary | ICD-10-CM | POA: Insufficient documentation

## 2015-03-12 DIAGNOSIS — R590 Localized enlarged lymph nodes: Secondary | ICD-10-CM | POA: Diagnosis not present

## 2015-03-12 DIAGNOSIS — J432 Centrilobular emphysema: Secondary | ICD-10-CM | POA: Insufficient documentation

## 2015-03-12 DIAGNOSIS — I2510 Atherosclerotic heart disease of native coronary artery without angina pectoris: Secondary | ICD-10-CM | POA: Insufficient documentation

## 2015-03-12 DIAGNOSIS — C760 Malignant neoplasm of head, face and neck: Secondary | ICD-10-CM | POA: Diagnosis present

## 2015-03-12 DIAGNOSIS — E119 Type 2 diabetes mellitus without complications: Secondary | ICD-10-CM | POA: Diagnosis not present

## 2015-03-12 DIAGNOSIS — I10 Essential (primary) hypertension: Secondary | ICD-10-CM | POA: Insufficient documentation

## 2015-03-12 DIAGNOSIS — R911 Solitary pulmonary nodule: Secondary | ICD-10-CM | POA: Diagnosis not present

## 2015-03-12 DIAGNOSIS — Z08 Encounter for follow-up examination after completed treatment for malignant neoplasm: Secondary | ICD-10-CM | POA: Diagnosis not present

## 2015-03-12 HISTORY — DX: Essential (primary) hypertension: I10

## 2015-03-12 LAB — CBC WITH DIFFERENTIAL/PLATELET
Basophils Absolute: 0.1 10*3/uL (ref 0–0.1)
Basophils Relative: 1 %
Eosinophils Absolute: 0.2 10*3/uL (ref 0–0.7)
Eosinophils Relative: 3 %
HCT: 44.3 % (ref 40.0–52.0)
Hemoglobin: 15.5 g/dL (ref 13.0–18.0)
Lymphocytes Relative: 12 %
Lymphs Abs: 0.9 10*3/uL — ABNORMAL LOW (ref 1.0–3.6)
MCH: 33.1 pg (ref 26.0–34.0)
MCHC: 35.1 g/dL (ref 32.0–36.0)
MCV: 94.4 fL (ref 80.0–100.0)
Monocytes Absolute: 0.7 10*3/uL (ref 0.2–1.0)
Monocytes Relative: 8 %
Neutro Abs: 6.3 10*3/uL (ref 1.4–6.5)
Neutrophils Relative %: 76 %
Platelets: 166 10*3/uL (ref 150–440)
RBC: 4.69 MIL/uL (ref 4.40–5.90)
RDW: 12.6 % (ref 11.5–14.5)
WBC: 8.2 10*3/uL (ref 3.8–10.6)

## 2015-03-12 LAB — BASIC METABOLIC PANEL
Anion gap: 10 (ref 5–15)
BUN: 13 mg/dL (ref 6–20)
CO2: 25 mmol/L (ref 22–32)
Calcium: 9.4 mg/dL (ref 8.9–10.3)
Chloride: 102 mmol/L (ref 101–111)
Creatinine, Ser: 1.21 mg/dL (ref 0.61–1.24)
GFR calc Af Amer: >60 mL/min (ref >60)
GFR calc non Af Amer: >60 mL/min (ref >60)
Glucose, Bld: 150 mg/dL — ABNORMAL HIGH (ref 65–99)
Potassium: 3.8 mmol/L (ref 3.5–5.1)
Sodium: 137 mmol/L (ref 135–145)

## 2015-03-12 LAB — TSH: TSH: 3.551 u[IU]/mL (ref 0.350–4.500)

## 2015-03-12 MED ORDER — IOHEXOL 300 MG/ML  SOLN
85.0000 mL | Freq: Once | INTRAMUSCULAR | Status: AC | PRN
  Administered 2015-03-12: 85 mL via INTRAVENOUS

## 2015-03-13 LAB — T4: T4, Total: 9.4 ug/dL (ref 4.5–12.0)

## 2015-03-18 ENCOUNTER — Encounter: Payer: Self-pay | Admitting: Radiation Oncology

## 2015-03-18 ENCOUNTER — Inpatient Hospital Stay (HOSPITAL_BASED_OUTPATIENT_CLINIC_OR_DEPARTMENT_OTHER): Payer: Medicare HMO | Admitting: Oncology

## 2015-03-18 ENCOUNTER — Other Ambulatory Visit: Payer: Self-pay | Admitting: *Deleted

## 2015-03-18 ENCOUNTER — Ambulatory Visit
Admission: RE | Admit: 2015-03-18 | Discharge: 2015-03-18 | Disposition: A | Discharge status: Home / Self Care | Payer: Medicare HMO | Source: Ambulatory Visit | Attending: Radiation Oncology | Admitting: Radiation Oncology

## 2015-03-18 VITALS — BP 141/87 | HR 69 | Temp 96.3°F | Resp 18 | Wt 269.8 lb

## 2015-03-18 DIAGNOSIS — Z79899 Other long term (current) drug therapy: Secondary | ICD-10-CM

## 2015-03-18 DIAGNOSIS — C099 Malignant neoplasm of tonsil, unspecified: Secondary | ICD-10-CM

## 2015-03-18 DIAGNOSIS — R911 Solitary pulmonary nodule: Secondary | ICD-10-CM | POA: Diagnosis not present

## 2015-03-18 DIAGNOSIS — R9389 Abnormal findings on diagnostic imaging of other specified body structures: Secondary | ICD-10-CM

## 2015-03-18 DIAGNOSIS — C760 Malignant neoplasm of head, face and neck: Secondary | ICD-10-CM

## 2015-03-18 NOTE — Progress Notes (Signed)
[place]  [phone] [phone]  ID: Shawn Lindsey OB: Sep 03, 1950  MR#: [id number]  AOZ#:[id number]  Patient Care Team: Pcp Not In System as PCP - General  CHIEF COMPLAINT:  No chief complaint on file.   INTERVAL HISTORY: Patient returns to clinic today for further evaluation and discussion of his imaging results.  He continues to feel well and is asymptomatic. He denies any fevers. He has no neurologic complaints. He denies any chest pain or shortness of breath. He denies any nausea, vomiting, constipation, or diarrhea. He has no urinary complaints. Patient offers no specific complaints today.  REVIEW OF SYSTEMS:   Review of Systems  Constitutional: Negative.   HENT: Negative.        Patient denies dysphagia.  Respiratory: Negative.   Cardiovascular: Negative.   Gastrointestinal: Negative.   Musculoskeletal: Negative.   Neurological: Negative.     As per HPI. Otherwise, a complete review of systems is negatve.  PAST MEDICAL HISTORY: Past Medical History  Diagnosis Date  . Cancer (HCC)     tonsil  . Diabetes mellitus without complication ([place])   . Hypertension     PAST SURGICAL HISTORY: No past surgical history on file.  FAMILY HISTORY: Reviewed and unchanged.  No reported history of malignancy or chronic disease.     ADVANCED DIRECTIVES:    HEALTH MAINTENANCE: Social History  Substance Use Topics  . Smoking status: Not on file  . Smokeless tobacco: Not on file  . Alcohol Use: Not on file     Colonoscopy:  PAP:  Bone density:  Lipid panel:  Allergies  Allergen Reactions  . No Known Allergies     Current Outpatient Prescriptions  Medication Sig Dispense Refill  . amLODipine (NORVASC) 10 MG tablet Take 10 mg by mouth daily.    Marland Kitchen aspirin 81 MG tablet Take 81 mg by mouth daily.    Marland Kitchen atorvastatin (LIPITOR) 80 MG tablet Take 80 mg by mouth daily. In the morning    . Cholecalciferol 2000 UNITS CAPS Take by  mouth daily.    Marland Kitchen FLUoxetine (PROZAC) 10 MG capsule Take 10 mg by mouth daily.    Marland Kitchen gabapentin (NEURONTIN) 100 MG capsule Take 400 mg by mouth daily. In the evening    . ibuprofen (ADVIL,MOTRIN) 200 MG tablet Take 400 mg by mouth daily. In the morning    . levETIRAcetam (KEPPRA) 500 MG tablet Take 500 mg by mouth 2 (two) times daily.    Marland Kitchen levothyroxine (SYNTHROID, LEVOTHROID) 125 MCG tablet Take 125 mcg by mouth daily.    Marland Kitchen losartan (COZAAR) 100 MG tablet Take 100 mg by mouth at bedtime.    . magnesium oxide (MAG-OX) 400 MG tablet Take 400 mg by mouth 2 (two) times daily.    Marland Kitchen omeprazole (PRILOSEC) 20 MG capsule Take 20 mg by mouth daily.    . prochlorperazine (COMPAZINE) 10 MG tablet Take 10 mg by mouth every 6 (six) hours as needed for nausea or vomiting.    . ranitidine (ZANTAC) 150 MG tablet take 1 tablet by mouth twice a day    . tiotropium (SPIRIVA) 18 MCG inhalation capsule Place 18 mcg into inhaler and inhale daily.     No current facility-administered medications for this visit.    OBJECTIVE: There were no vitals filed for this visit.   There is no weight on file to calculate BMI.    ECOG FS:0 - Asymptomatic  General: Well-developed, well-nourished, no acute distress. Eyes: Anicteric sclera.  HEENT: Normocephalic, moist mucous membranes, clear oropharnyx. No palpable lymphadenopathy, thyroid midline without nodules. Lungs: Clear to auscultation bilaterally. Heart: Regular rate and rhythm. No rubs, murmurs, or gallops. Abdomen: Soft, nontender, nondistended. No organomegaly noted, normoactive bowel sounds. Musculoskeletal: No edema, cyanosis, or clubbing. Neuro: Alert, answering all questions appropriately. Cranial nerves grossly intact. Skin: No rashes or petechiae noted. Psych: Normal affect.   LAB RESULTS:  Lab Results  Component Value Date   NA 137 03/12/2015   K 3.8 03/12/2015   CL 102 03/12/2015   CO2 25 03/12/2015   GLUCOSE 150* 03/12/2015   BUN 13 03/12/2015    CREATININE 1.21 03/12/2015   CALCIUM 9.4 03/12/2015   PROT 7.1 10/21/2014   ALBUMIN 4.0 10/21/2014   AST 19 10/21/2014   ALT 18 10/21/2014   ALKPHOS 90 10/21/2014   BILITOT 0.5 10/21/2014   GFRNONAA >60 03/12/2015   GFRAA >60 03/12/2015    Lab Results  Component Value Date   WBC 8.2 03/12/2015   NEUTROABS 6.3 03/12/2015   HGB 15.5 03/12/2015   HCT 44.3 03/12/2015   MCV 94.4 03/12/2015   PLT 166 03/12/2015     STUDIES: Ct Soft Tissue Neck W Contrast  03/12/2015  CLINICAL DATA:  History of left tonsillar cancer status post surgery, radiation, and chemotherapy. Restaging. EXAM: CT NECK WITH CONTRAST TECHNIQUE: Multidetector CT imaging of the neck was performed using the standard protocol following the bolus administration of intravenous contrast. CONTRAST:  14m OMNIPAQUE IOHEXOL 300 MG/ML  SOLN COMPARISON:  PET-CT 10/21/2014.  Neck CT 03/14/2014. FINDINGS: Pharynx and larynx: Slight asymmetry of the posterior nasopharyngeal soft tissues on the left without a discrete mass. No residual left palatine tonsillar mass is identified. Larynx is unremarkable. Salivary glands: Small enhancing soft tissue nodule at the inferior aspect of the left parotid gland has decreased in size from the prior neck CT, now measuring 9 x 8 mm (series 2, image 45, previously 13 x 9 mm). This also appears minimally smaller than on the more recent PET-CT. Submandibular and parotid glands are otherwise unremarkable. Thyroid: 14 mm calcified nodule in the region of the thyroid isthmus is unchanged. Lymph nodes: An 8 mm short axis left level II lymph node is smaller than on the prior neck CT (series 2, image 55, previously 10 mm) and was not hypermetabolic on the most recent PET-CT. There is also a 4 mm short axis left level III lymph node which is smaller than on the prior neck CT (series 2, image 67, previously 6 mm). Vascular: Prominent atherosclerotic calcification at the carotid bifurcations and involving the  visualized thoracic aorta as well as vertebral arteries. 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 Visualized orbits: Unremarkable. Mastoids and visualized paranasal sinuses: Prominent circumferential left maxillary sinus mucosal thickening. Clear mastoid air cells. Skeleton: Multilevel cervical disc and facet degeneration. No suspicious lytic or blastic osseous lesions are identified. Upper chest: Evaluated on concurrent dedicated chest CT. IMPRESSION: 1. 9 mm soft tissue nodule/lymph node at the inferior aspect of the left parotid gland, unchanged from prior PET-CT and mildly decreased in size from the 2015 neck CT. 2. Small left level II and III lymph nodes have decreased from the prior neck CT and were not hypermetabolic on the interval PET-CT. 3. No evidence of recurrent tonsillar mass. Electronically Signed   By: ALogan BoresM.D.   On: 03/12/2015 12:15   Ct Chest W Contrast  03/12/2015  CLINICAL DATA:  Restaging head neck carcinoma  EXAM: CT CHEST WITH CONTRAST TECHNIQUE: Multidetector CT imaging of the chest was performed during intravenous contrast administration. CONTRAST:  75m OMNIPAQUE IOHEXOL 300 MG/ML  SOLN COMPARISON:  08/13/2014 FINDINGS: Mediastinum: Heart size is normal. There is no pericardial effusion. Aortic atherosclerosis noted. Calcification within the LAD and left circumflex coronary artery identified. The trachea appears patent and is midline. Normal appearance of the esophagus. No mediastinal adenopathy. No hilar adenopathy identified. No axillary or supraclavicular adenopathy. Lungs/Pleura: No pleural effusions. Moderate to advanced changes of centrilobular emphysema identified. Cavitary process within the right upper lobe is again identified. On today's examination this measures 5.0 x 2.2 x 3.6 cm. Previously 5.4 x 2.5 x 3.5 cm. New adjacent nodular density measures 9 mm, image 15 of series 3. There is also a new solid nodule measuring  5 mm, image 26/series 3. The left upper lobe lung nodule measure 4 mm, image 34 of series 3. This is new previous exam. Adjacent solid nodule is also new from previous exam measuring 4 mm, image number 36 of series 3. Upper Abdomen: There is no suspicious liver abnormality. Stone is identified within the gallbladder measuring 1.2 cm. The adrenal glands are unremarkable. Musculoskeletal: Multi level spondylosis identified within the thoracic spine. No aggressive lytic or sclerotic bone lesion. IMPRESSION: 1. Results of today's findings are suspicious. There is a cavitary process within the right upper lobe which demonstrates mild increase in size in the interval. Additionally, there are several new nodular densities within the right upper lobe and left upper lobe. Metastatic disease or primary pulmonary neoplasm cannot be excluded. Consider further evaluation with biopsy or surgical resection. 2. Aortic atherosclerosis including coronary artery calcification. Electronically Signed   By: TKerby MoorsM.D.   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 Head and neck cancer:  Ct scan results reviewed independently with no obvious evidence of recurrence. Previously noted lymph node on the left is improving. No intervention is needed at this time.  Continue follow up with ENT as scheduled. Will reimage in 6 months.  If there is continue improved of lymphadenopathy, can discontinue imaging at that time. Return to clinic in 3 months for repeat laboratory work and further evaluation. 2. Dysphasia: Resolved. Continue Carafate as needed. 3. Pulmonary nodule: Imaging as above with slight worsening of disease.  Will send a referral to pulmonology for further evaluation and consideration of navigational biopsy.  4.  Thrombocytopenia: Resolved. 5.  Elevated creatinine: Patient's creatinine is now within normal limits.    Patient expressed understanding and  was in agreement with this plan. He also understands that He can call clinic at any time with any questions, concerns, or complaints.   Cancer of tonsil   Staging form: Pharynx - Oropharynx, AJCC 7th Edition     Clinical stage from 11/03/2014: Stage II (T2, N0, M0) - Signed by TLloyd Huger MD on 11/03/2014   TLloyd Huger MD   03/18/2015 11:25 PM

## 2015-03-18 NOTE — Progress Notes (Signed)
Radiation Oncology Follow up Note  Name: Shawn Lindsey   Date:   03/18/2015 MRN:  [id number] DOB: 1950-10-05    This 64 y.o. male presents to the clinic today for follow-up for stage II (T2 N1 M0 screw cell carcinoma the tonsillar fossa status post tonsillar resection and adjuvant chemoradiation.  REFERRING PROVIDER: No ref. provider found  HPI: Patient is a 64 year old male now out 6 months having completed combined without modality treatment with chemotherapy and radiation for stage II squamous cell carcinoma of the tonsillar fossa. He is seen today in routine follow-up and is doing well. He specifically denies head and neck pain or dysphagia. Recently had a CT scan. Showing no evidence of recurrent tonsillar mass does have some small level II and 3 nodes unchanged from prior examination. Patient also had last week CT scan of his chest showing cavitary process in the right upper lobe with mild increase in size over the past 6 months. Results of several new nodular densities in the right upper lobe and left upper lobe with metastatic disease cannot be excluded. He is really having no pulmonary symptoms he's had multiple episodes of pneumonia in the past. Have reviewed those CT scans at this time.  COMPLICATIONS OF TREATMENT: none  FOLLOW UP COMPLIANCE: keeps appointments   PHYSICAL EXAM:  BP 141/87 mmHg  Pulse 69  Temp(Src) 96.3 F (35.7 C)  Resp 18  Wt 269 lb 13.5 oz (122.4 kg) Oral cavity is clear no oral mucosal lesions are identified tonsillar fossae laterally are clear without evidence of nodularity or mass. Indirect mirror examination shows upper airway clear vallecula and base of tongue within normal limits. Neck is clear without evidence of subject gastric cervical or supraclavicular adenopathy. Well-developed well-nourished patient in NAD. HEENT reveals PERLA, EOMI, discs not visualized.  Oral cavity is clear. No oral mucosal lesions are identified. Neck is clear without  evidence of cervical or supraclavicular adenopathy. Lungs are clear to A&P. Cardiac examination is essentially unremarkable with regular rate and rhythm without murmur rub or thrill. Abdomen is benign with no organomegaly or masses noted. Motor sensory and DTR levels are equal and symmetric in the upper and lower extremities. Cranial nerves II through XII are grossly intact. Proprioception is intact. No peripheral adenopathy or edema is identified. No motor or sensory levels are noted. Crude visual fields are within normal range.  RADIOLOGY RESULTS: CT scans of chest and head and neck region are reviewed and compatible with the above-stated findings  PLAN: Present time I'll will discuss the case with medical oncology. I believe we can reevaluate his CT scan in 6 months time. I compared those lesions to his prior PET/CT they had mild hypermetabolic activity it may be a resolving pneumonia. Other options would be to repeat PET repeat PET CT scan. We'll try to present his case at our weekly tumor conference and discuss the case personally with medical oncology. Otherwise I've asked to see the patient back in 6 months for follow-up.  I would like to take this opportunity for allowing me to participate in the care of your patient.Armstead Peaks., MD

## 2015-04-06 ENCOUNTER — Ambulatory Visit (INDEPENDENT_AMBULATORY_CARE_PROVIDER_SITE_OTHER): Payer: Medicare HMO | Admitting: Internal Medicine

## 2015-04-06 ENCOUNTER — Encounter: Payer: Self-pay | Admitting: Internal Medicine

## 2015-04-06 VITALS — BP 132/74 | HR 59 | Ht 70.0 in | Wt 269.2 lb

## 2015-04-06 DIAGNOSIS — R918 Other nonspecific abnormal finding of lung field: Secondary | ICD-10-CM | POA: Diagnosis not present

## 2015-04-06 MED ORDER — UMECLIDINIUM BROMIDE 62.5 MCG/INH IN AEPB: 1.0000 | INHALATION_SPRAY | Freq: Every day | RESPIRATORY_TRACT | Status: AC

## 2015-04-06 MED ORDER — UMECLIDINIUM BROMIDE 62.5 MCG/INH IN AEPB: 1.0000 | INHALATION_SPRAY | Freq: Every day | RESPIRATORY_TRACT | Status: DC

## 2015-04-06 NOTE — Progress Notes (Signed)
Shawn [place]      Date: 04/06/2015,   MRN# [id number] Shawn [place] Aug 06, 1950 Code Status:  Hosp day:'@LENGTHOFSTAYDAYS'$ @ Referring MD: '@ATDPROV'$ @     PCP:      AdmissionWeight: 269 lb 3.2 oz (122.108 kg)                 CurrentWeight: 269 lb 3.2 oz (122.108 kg) Shawn [place] is a 64 y.o. old male seen in [place] for RUL nodule and opacity with COPD at the request of Dr. Alyssa Grove.     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 Grayland Ormond for cancer care  Patient has had no significant weight loss, denies fevers, chills night sweats Patient has COPD and seems to be doing Ok on spiriva-still with occassional wheezing Patient stopped advair 8 months ago and states that not helping Patient has no active wheezing today, denies CP,SOB today. He has no leg swelling Patient still continues to smoke 1 ppd Has 40 pack year tobacco use  RUL opacity seen over the past 1 year Most recent CT chest as noted below  CT chest on 03/12/15 reviewed with patient and family on 04/06/2015 Lungs/Pleura: No pleural effusions. Moderate to advanced changes of centrilobular emphysema identified. Cavitary process within the right upper lobe is again identified. On today's examination this measures 5.0 x 2.2 x 3.6 cm. Previously 5.4 x 2.5 x 3.5 cm.  This is approx the same size over past 1 year      PAST MEDICAL HISTORY   Past Medical History  Diagnosis Date  . Cancer (HCC)     tonsil  . Diabetes mellitus without  complication ([place])   . Hypertension   . COPD (chronic obstructive pulmonary disease) ([place])      SURGICAL HISTORY   Past Surgical History  Procedure Laterality Date  . Tonsillectomy    . Tracheostomy       FAMILY HISTORY   Family History  Problem Relation Age of Onset  . Diabetes Brother   . Dementia Mother      SOCIAL HISTORY   Social History  Substance Use Topics  . Smoking status: Current Every Day Smoker -- 1.00 packs/day for 40 years  . Smokeless tobacco: None  . Alcohol Use: No     MEDICATIONS    Home Medication:  Current Outpatient Rx  Name  Route  Sig  Dispense  Refill  . amLODipine (NORVASC) 10 MG tablet   Oral   Take 10 mg by mouth daily.         Marland Kitchen aspirin 81 MG tablet   Oral   Take 81 mg by mouth daily.         Marland Kitchen atorvastatin (LIPITOR) 80 MG tablet   Oral   Take 80 mg by mouth daily. In the morning         . Cholecalciferol 2000 UNITS CAPS   Oral   Take by mouth daily.         Marland Kitchen FLUoxetine (PROZAC) 10 MG capsule  Oral   Take 10 mg by mouth daily.         Marland Kitchen gabapentin (NEURONTIN) 100 MG capsule   Oral   Take 400 mg by mouth daily. In the evening         . ibuprofen (ADVIL,MOTRIN) 200 MG tablet   Oral   Take 400 mg by mouth daily. In the morning         . levETIRAcetam (KEPPRA) 500 MG tablet   Oral   Take 500 mg by mouth 2 (two) times daily.         Marland Kitchen levothyroxine (SYNTHROID, LEVOTHROID) 125 MCG tablet   Oral   Take 125 mcg by mouth daily.         Marland Kitchen losartan (COZAAR) 100 MG tablet   Oral   Take 100 mg by mouth at bedtime.         . magnesium oxide (MAG-OX) 400 MG tablet   Oral   Take 400 mg by mouth 2 (two) times daily.         Marland Kitchen omeprazole (PRILOSEC) 20 MG capsule   Oral   Take 20 mg by mouth daily.         . prochlorperazine (COMPAZINE) 10 MG tablet   Oral   Take 10 mg by mouth every 6 (six) hours as needed for nausea or vomiting.         . ranitidine (ZANTAC) 150 MG tablet      take 1  tablet by mouth twice a day         . tiotropium (SPIRIVA) 18 MCG inhalation capsule   Inhalation   Place 18 mcg into inhaler and inhale daily.           Current Medication:  Current outpatient prescriptions:  .  amLODipine (NORVASC) 10 MG tablet, Take 10 mg by mouth daily., Disp: , Rfl:  .  aspirin 81 MG tablet, Take 81 mg by mouth daily., Disp: , Rfl:  .  atorvastatin (LIPITOR) 80 MG tablet, Take 80 mg by mouth daily. In the morning, Disp: , Rfl:  .  Cholecalciferol 2000 UNITS CAPS, Take by mouth daily., Disp: , Rfl:  .  FLUoxetine (PROZAC) 10 MG capsule, Take 10 mg by mouth daily., Disp: , Rfl:  .  gabapentin (NEURONTIN) 100 MG capsule, Take 400 mg by mouth daily. In the evening, Disp: , Rfl:  .  ibuprofen (ADVIL,MOTRIN) 200 MG tablet, Take 400 mg by mouth daily. In the morning, Disp: , Rfl:  .  levETIRAcetam (KEPPRA) 500 MG tablet, Take 500 mg by mouth 2 (two) times daily., Disp: , Rfl:  .  levothyroxine (SYNTHROID, LEVOTHROID) 125 MCG tablet, Take 125 mcg by mouth daily., Disp: , Rfl:  .  losartan (COZAAR) 100 MG tablet, Take 100 mg by mouth at bedtime., Disp: , Rfl:  .  magnesium oxide (MAG-OX) 400 MG tablet, Take 400 mg by mouth 2 (two) times daily., Disp: , Rfl:  .  omeprazole (PRILOSEC) 20 MG capsule, Take 20 mg by mouth daily., Disp: , Rfl:  .  prochlorperazine (COMPAZINE) 10 MG tablet, Take 10 mg by mouth every 6 (six) hours as needed for nausea or vomiting., Disp: , Rfl:  .  ranitidine (ZANTAC) 150 MG tablet, take 1 tablet by mouth twice a day, Disp: , Rfl:  .  tiotropium (SPIRIVA) 18 MCG inhalation capsule, Place 18 mcg into inhaler and inhale daily., Disp: , Rfl:     ALLERGIES   No known allergies  REVIEW OF SYSTEMS   Review of Systems  Constitutional: Negative for fever, chills, weight loss, malaise/fatigue and diaphoresis.  HENT: Negative.  Negative for congestion.   Eyes: Negative for blurred vision and double vision.  Respiratory: Positive for  wheezing. Negative for cough, hemoptysis, sputum production and shortness of breath.   Cardiovascular: Negative for chest pain, palpitations and orthopnea.  Gastrointestinal: Negative for heartburn, nausea, vomiting, abdominal pain, diarrhea, constipation and blood in stool.  Genitourinary: Negative for dysuria and urgency.  Musculoskeletal: Negative for myalgias, back pain and neck pain.  Skin: Negative for rash.  Neurological: Negative for dizziness, tingling, tremors and weakness.  Endo/Heme/Allergies: Does not bruise/bleed easily.  Psychiatric/Behavioral: The patient is not nervous/anxious.   All other systems reviewed and are negative.    VS: BP 132/74 mmHg  Pulse 59  Ht '5\' 10"'$  (1.778 m)  Wt 269 lb 3.2 oz (122.108 kg)  BMI 38.63 kg/m2  SpO2 98%     PHYSICAL EXAM  Physical Exam  Constitutional: He is oriented to person, place, and time. He appears well-developed and well-nourished. No distress.  HENT:  Head: Normocephalic and atraumatic.  Mouth/Throat: No oropharyngeal exudate.  Eyes: EOM are normal. Pupils are equal, round, and reactive to light.  Neck: Normal range of motion. Neck supple.  Cardiovascular: Normal rate, regular rhythm and normal heart sounds.   No murmur heard. Pulmonary/Chest: No stridor. No respiratory distress. He has no wheezes.  Abdominal: Soft. Bowel sounds are normal. He exhibits no distension.  Musculoskeletal: Normal range of motion. He exhibits no edema.  Neurological: He is alert and oriented to person, place, and time. No cranial nerve deficit.  Skin: Skin is warm. He is not diaphoretic.  Psychiatric: He has a normal mood and affect.        LABS    No results for input(s): HGB, HCT, MCV, WBC, POTASSIUM, CHLORIDE, BUN, CREATININE, GLUCOSE, CALCIUM, INR, PTT in the last 72 hours.  Invalid input(s): PLATELET, BANDS, NEUTROPHIL, LYMPHOCYTE, MONOCYTE, EOSINOPHILS, BASOPHIL, SODIUM, BICARBONATE, MAGNESIUM, PHOSPHORUS, PT, SGPT, SGOT,    No  results for input(s): PH in the last 72 hours.  Invalid input(s): PCO2, PO2, BASEEXCESS, BASEDEFICITE, TFT    CULTURE RESULTS   No results found for this or any previous visit (from the past 240 hour(s)).        IMAGING    Ct Soft Tissue Neck W Contrast  03/12/2015  CLINICAL DATA:  History of left tonsillar cancer status post surgery, radiation, and chemotherapy. Restaging. EXAM: CT NECK WITH CONTRAST TECHNIQUE: Multidetector CT imaging of the neck was performed using the standard protocol following the bolus administration of intravenous contrast. CONTRAST:  52m OMNIPAQUE IOHEXOL 300 MG/ML  SOLN COMPARISON:  PET-CT 10/21/2014.  Neck CT 03/14/2014. FINDINGS: Pharynx and larynx: Slight asymmetry of the posterior nasopharyngeal soft tissues on the left without a discrete mass. No residual left palatine tonsillar mass is identified. Larynx is unremarkable. Salivary glands: Small enhancing soft tissue nodule at the inferior aspect of the left parotid gland has decreased in size from the prior neck CT, now measuring 9 x 8 mm (series 2, image 45, previously 13 x 9 mm). This also appears minimally smaller than on the more recent PET-CT. Submandibular and parotid glands are otherwise unremarkable. Thyroid: 14 mm calcified nodule in the region of the thyroid isthmus is unchanged. Lymph nodes: An 8 mm short axis left level II lymph node is smaller than on the prior neck CT (series 2, image 55, previously 10 mm) and was  not hypermetabolic on the most recent PET-CT. There is also a 4 mm short axis left level III lymph node which is smaller than on the prior neck CT (series 2, image 67, previously 6 mm). Vascular: Prominent atherosclerotic calcification at the carotid bifurcations and involving the visualized thoracic aorta as well as vertebral arteries. 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 Visualized orbits: Unremarkable. Mastoids and visualized paranasal  sinuses: Prominent circumferential left maxillary sinus mucosal thickening. Clear mastoid air cells. Skeleton: Multilevel cervical disc and facet degeneration. No suspicious lytic or blastic osseous lesions are identified. Upper chest: Evaluated on concurrent dedicated chest CT. IMPRESSION: 1. 9 mm soft tissue nodule/lymph node at the inferior aspect of the left parotid gland, unchanged from prior PET-CT and mildly decreased in size from the 2015 neck CT. 2. Small left level II and III lymph nodes have decreased from the prior neck CT and were not hypermetabolic on the interval PET-CT. 3. No evidence of recurrent tonsillar mass. Electronically Signed   By: Logan Bores M.D.   On: 03/12/2015 12:15   Ct Chest W Contrast  03/12/2015  CLINICAL DATA:  Restaging head neck carcinoma EXAM: CT CHEST WITH CONTRAST TECHNIQUE: Multidetector CT imaging of the chest was performed during intravenous contrast administration. CONTRAST:  82m OMNIPAQUE IOHEXOL 300 MG/ML  SOLN COMPARISON:  08/13/2014 FINDINGS: Mediastinum: Heart size is normal. There is no pericardial effusion. Aortic atherosclerosis noted. Calcification within the LAD and left circumflex coronary artery identified. The trachea appears patent and is midline. Normal appearance of the esophagus. No mediastinal adenopathy. No hilar adenopathy identified. No axillary or supraclavicular adenopathy. Lungs/Pleura: No pleural effusions. Moderate to advanced changes of centrilobular emphysema identified. Cavitary process within the right upper lobe is again identified. On today's examination this measures 5.0 x 2.2 x 3.6 cm. Previously 5.4 x 2.5 x 3.5 cm. New adjacent nodular density measures 9 mm, image 15 of series 3. There is also a new solid nodule measuring 5 mm, image 26/series 3. The left upper lobe lung nodule measure 4 mm, image 34 of series 3. This is new previous exam. Adjacent solid nodule is also new from previous exam measuring 4 mm, image number 36 of series  3. Upper Abdomen: There is no suspicious liver abnormality. Stone is identified within the gallbladder measuring 1.2 cm. The adrenal glands are unremarkable. Musculoskeletal: Multi level spondylosis identified within the thoracic spine. No aggressive lytic or sclerotic bone lesion. IMPRESSION: 1. Results of today's findings are suspicious. There is a cavitary process within the right upper lobe which demonstrates mild increase in size in the interval. Additionally, there are several new nodular densities within the right upper lobe and left upper lobe. Metastatic disease or primary pulmonary neoplasm cannot be excluded. Consider further evaluation with biopsy or surgical resection. 2. Aortic atherosclerosis including coronary artery calcification. Electronically Signed   By: TKerby MoorsM.D.   On: 03/12/2015 11:59        ASSESSMENT/PLAN   64yo white male with COPD Stage A with persistent RUL nodular opacity  1.COPD -stop spiriva and start Incruse -albuterol as needed  2.Will repeat CT chest in 3 months and will assess for Bronch and ENB if needed  The Risks and Benefits of the Bronchoscopy with ENB were explained to patient/family and I have discussed the risk for acute bleeding, increased chance of infection, increased chance of respiratory failure and cardiac arrest and death. The patient/family understand the risks and benefits. Will re-evaluate this  procedure after assessment at next visit.  Follow up in 3 months after Ct chest  The Patient requires high complexity decision making for assessment and support, frequent evaluation and titration of therapies, application of advanced monitoring technologies and extensive interpretation of multiple databases.  Patient/Family are satisfied with Plan of action and management. All questions answered  Corrin Parker, M.D.  [facility]  Medical Director [place] Director [facility]  Cardio-Pulmonary Department

## 2015-04-06 NOTE — Patient Instructions (Signed)
Chronic Obstructive Pulmonary Disease Chronic obstructive pulmonary disease (COPD) is a common lung condition in which airflow from the lungs is limited. COPD is a general term that can be used to describe many different lung problems that limit airflow, including both chronic bronchitis and emphysema. If you have COPD, your lung function will probably never return to normal, but there are measures you can take to improve lung function and make yourself feel better. CAUSES   Smoking (common).  Exposure to secondhand smoke.  Genetic problems.  Chronic inflammatory lung diseases or recurrent infections. SYMPTOMS  Shortness of breath, especially with physical activity.  Deep, persistent (chronic) cough with a large amount of thick mucus.  Wheezing.  Rapid breaths (tachypnea).  Gray or bluish discoloration (cyanosis) of the skin, especially in your fingers, toes, or lips.  Fatigue.  Weight loss.  Frequent infections or episodes when breathing symptoms become much worse (exacerbations).  Chest tightness. DIAGNOSIS Your health care provider will take a medical history and perform a physical examination to diagnose COPD. Additional tests for COPD may include:  Lung (pulmonary) function tests.  Chest X-ray.  CT scan.  Blood tests. TREATMENT  Treatment for COPD may include:  Inhaler and nebulizer medicines. These help manage the symptoms of COPD and make your breathing more comfortable.  Supplemental oxygen. Supplemental oxygen is only helpful if you have a low oxygen level in your blood.  Exercise and physical activity. These are beneficial for nearly all people with COPD.  Lung surgery or transplant.  Nutrition therapy to gain weight, if you are underweight.  Pulmonary rehabilitation. This may involve working with a team of health care providers and specialists, such as respiratory, occupational, and physical therapists. HOME CARE INSTRUCTIONS  Take all medicines  (inhaled or pills) as directed by your health care provider.  Avoid over-the-counter medicines or cough syrups that dry up your airway (such as antihistamines) and slow down the elimination of secretions unless instructed otherwise by your health care provider.  If you are a smoker, the most important thing that you can do is stop smoking. Continuing to smoke will cause further lung damage and breathing trouble. Ask your health care provider for help with quitting smoking. He or she can direct you to community resources or hospitals that provide support.  Avoid exposure to irritants such as smoke, chemicals, and fumes that aggravate your breathing.  Use oxygen therapy and pulmonary rehabilitation if directed by your health care provider. If you require home oxygen therapy, ask your health care provider whether you should purchase a pulse oximeter to measure your oxygen level at home.  Avoid contact with individuals who have a contagious illness.  Avoid extreme temperature and humidity changes.  Eat healthy foods. Eating smaller, more frequent meals and resting before meals may help you maintain your strength.  Stay active, but balance activity with periods of rest. Exercise and physical activity will help you maintain your ability to do things you want to do.  Preventing infection and hospitalization is very important when you have COPD. Make sure to receive all the vaccines your health care provider recommends, especially the pneumococcal and influenza vaccines. Ask your health care provider whether you need a pneumonia vaccine.  Learn and use relaxation techniques to manage stress.  Learn and use controlled breathing techniques as directed by your health care provider. Controlled breathing techniques include:  Pursed lip breathing. Start by breathing in (inhaling) through your nose for 1 second. Then, purse your lips as if you were   going to whistle and breathe out (exhale) through the  pursed lips for 2 seconds.  Diaphragmatic breathing. Start by putting one hand on your abdomen just above your waist. Inhale slowly through your nose. The hand on your abdomen should move out. Then purse your lips and exhale slowly. You should be able to feel the hand on your abdomen moving in as you exhale.  Learn and use controlled coughing to clear mucus from your lungs. Controlled coughing is a series of short, progressive coughs. The steps of controlled coughing are: 1. Lean your head slightly forward. 2. Breathe in deeply using diaphragmatic breathing. 3. Try to hold your breath for 3 seconds. 4. Keep your mouth slightly open while coughing twice. 5. Spit any mucus out into a tissue. 6. Rest and repeat the steps once or twice as needed. SEEK MEDICAL CARE IF:  You are coughing up more mucus than usual.  There is a change in the color or thickness of your mucus.  Your breathing is more labored than usual.  Your breathing is faster than usual. SEEK IMMEDIATE MEDICAL CARE IF:  You have shortness of breath while you are resting.  You have shortness of breath that prevents you from:  Being able to talk.  Performing your usual physical activities.  You have chest pain lasting longer than 5 minutes.  Your skin color is more cyanotic than usual.  You measure low oxygen saturations for longer than 5 minutes with a pulse oximeter. MAKE SURE YOU:  Understand these instructions.  Will watch your condition.  Will get help right away if you are not doing well or get worse.   This information is not intended to replace advice given to you by your health care provider. Make sure you discuss any questions you have with your health care provider.   Document Released: 01/26/2005 Document Revised: 05/09/2014 Document Reviewed: 12/13/2012 [employer] Interactive Patient Education 2016 [employer]  

## 2015-06-18 ENCOUNTER — Inpatient Hospital Stay: Payer: Medicare HMO | Attending: Oncology

## 2015-06-18 ENCOUNTER — Inpatient Hospital Stay (HOSPITAL_BASED_OUTPATIENT_CLINIC_OR_DEPARTMENT_OTHER): Payer: Medicare HMO | Admitting: Oncology

## 2015-06-18 VITALS — BP 114/75 | HR 57 | Temp 97.6°F | Resp 18 | Wt 276.9 lb

## 2015-06-18 DIAGNOSIS — Z85818 Personal history of malignant neoplasm of other sites of lip, oral cavity, and pharynx: Secondary | ICD-10-CM

## 2015-06-18 DIAGNOSIS — R911 Solitary pulmonary nodule: Secondary | ICD-10-CM | POA: Diagnosis not present

## 2015-06-18 DIAGNOSIS — Z7982 Long term (current) use of aspirin: Secondary | ICD-10-CM | POA: Insufficient documentation

## 2015-06-18 DIAGNOSIS — I10 Essential (primary) hypertension: Secondary | ICD-10-CM | POA: Insufficient documentation

## 2015-06-18 DIAGNOSIS — E119 Type 2 diabetes mellitus without complications: Secondary | ICD-10-CM | POA: Diagnosis not present

## 2015-06-18 DIAGNOSIS — F17210 Nicotine dependence, cigarettes, uncomplicated: Secondary | ICD-10-CM | POA: Insufficient documentation

## 2015-06-18 DIAGNOSIS — Z79899 Other long term (current) drug therapy: Secondary | ICD-10-CM | POA: Insufficient documentation

## 2015-06-18 DIAGNOSIS — J449 Chronic obstructive pulmonary disease, unspecified: Secondary | ICD-10-CM | POA: Insufficient documentation

## 2015-06-18 DIAGNOSIS — R944 Abnormal results of kidney function studies: Secondary | ICD-10-CM | POA: Diagnosis not present

## 2015-06-18 DIAGNOSIS — C760 Malignant neoplasm of head, face and neck: Secondary | ICD-10-CM

## 2015-06-18 DIAGNOSIS — C099 Malignant neoplasm of tonsil, unspecified: Secondary | ICD-10-CM

## 2015-06-18 LAB — COMPREHENSIVE METABOLIC PANEL
ALT: 21 U/L (ref 17–63)
AST: 19 U/L (ref 15–41)
Albumin: 4.3 g/dL (ref 3.5–5.0)
Alkaline Phosphatase: 78 U/L (ref 38–126)
Anion gap: 8 (ref 5–15)
BUN: 18 mg/dL (ref 6–20)
CO2: 24 mmol/L (ref 22–32)
Calcium: 9 mg/dL (ref 8.9–10.3)
Chloride: 102 mmol/L (ref 101–111)
Creatinine, Ser: 1.31 mg/dL — ABNORMAL HIGH (ref 0.61–1.24)
GFR calc Af Amer: >60 mL/min (ref >60)
GFR calc non Af Amer: 56 mL/min — ABNORMAL LOW (ref >60)
Glucose, Bld: 137 mg/dL — ABNORMAL HIGH (ref 65–99)
Potassium: 3.8 mmol/L (ref 3.5–5.1)
Sodium: 134 mmol/L — ABNORMAL LOW (ref 135–145)
Total Bilirubin: 0.8 mg/dL (ref 0.3–1.2)
Total Protein: 7.4 g/dL (ref 6.5–8.1)

## 2015-06-18 LAB — CBC WITH DIFFERENTIAL/PLATELET
Basophils Absolute: 0.1 10*3/uL (ref 0–0.1)
Basophils Relative: 1 %
Eosinophils Absolute: 0.2 10*3/uL (ref 0–0.7)
Eosinophils Relative: 2 %
HCT: 47.3 % (ref 40.0–52.0)
Hemoglobin: 16.7 g/dL (ref 13.0–18.0)
Lymphocytes Relative: 10 %
Lymphs Abs: 0.9 10*3/uL — ABNORMAL LOW (ref 1.0–3.6)
MCH: 33.3 pg (ref 26.0–34.0)
MCHC: 35.3 g/dL (ref 32.0–36.0)
MCV: 94.3 fL (ref 80.0–100.0)
Monocytes Absolute: 0.5 10*3/uL (ref 0.2–1.0)
Monocytes Relative: 6 %
Neutro Abs: 6.8 10*3/uL — ABNORMAL HIGH (ref 1.4–6.5)
Neutrophils Relative %: 81 %
Platelets: 155 10*3/uL (ref 150–440)
RBC: 5.01 MIL/uL (ref 4.40–5.90)
RDW: 13.3 % (ref 11.5–14.5)
WBC: 8.4 10*3/uL (ref 3.8–10.6)

## 2015-06-18 NOTE — Progress Notes (Signed)
[facility]  [phone]) [phone] Fax:(336) [phone]  ID: Shawn Lindsey OB: 08-06-1950  MR#: [id number]  JGG#:[id number]  Patient Care Team: Laurell Josephs, MD as PCP - General (Family Medicine)  CHIEF COMPLAINT:  Chief Complaint  Patient presents with  . head and neck cancer    INTERVAL HISTORY: Patient returns to clinic today for routine 3 month evaluation.  He continues to feel well and is asymptomatic. He denies any fevers. He has no neurologic complaints. He denies any chest pain or shortness of breath. He denies any nausea, vomiting, constipation, or diarrhea. He has no urinary complaints. Patient offers no specific complaints today.  REVIEW OF SYSTEMS:   Review of Systems  Constitutional: Negative.   HENT: Negative.        Patient denies dysphagia.  Respiratory: Negative.   Cardiovascular: Negative.   Gastrointestinal: Negative.   Musculoskeletal: Negative.   Neurological: Negative.     As per HPI. Otherwise, a complete review of systems is negatve.  PAST MEDICAL HISTORY: Past Medical History  Diagnosis Date  . Cancer (HCC)     tonsil  . Diabetes mellitus without complication ([place])   . Hypertension   . COPD (chronic obstructive pulmonary disease) ([place])     PAST SURGICAL HISTORY: Past Surgical History  Procedure Laterality Date  . Tonsillectomy    . Tracheostomy      FAMILY HISTORY: Reviewed and unchanged.  No reported history of malignancy or chronic disease.     ADVANCED DIRECTIVES:    HEALTH MAINTENANCE: Social History  Substance Use Topics  . Smoking status: Current Every Day Smoker -- 1.00 packs/day for 40 years  . Smokeless tobacco: Not on file  . Alcohol Use: No     Colonoscopy:  PAP:  Bone density:  Lipid panel:  Allergies  Allergen Reactions  . No Known Allergies     Current Outpatient Prescriptions  Medication Sig Dispense Refill  . amLODipine (NORVASC) 10 MG tablet Take 10 mg by mouth daily.    Marland Kitchen  aspirin 81 MG tablet Take 81 mg by mouth daily.    Marland Kitchen atorvastatin (LIPITOR) 80 MG tablet Take 80 mg by mouth daily. In the morning    . Cholecalciferol 2000 UNITS CAPS Take by mouth daily.    Marland Kitchen FLUoxetine (PROZAC) 10 MG capsule Take 10 mg by mouth daily.    Marland Kitchen gabapentin (NEURONTIN) 100 MG capsule Take 400 mg by mouth daily. In the evening    . ibuprofen (ADVIL,MOTRIN) 200 MG tablet Take 400 mg by mouth daily. In the morning    . levETIRAcetam (KEPPRA) 500 MG tablet Take 500 mg by mouth 2 (two) times daily.    Marland Kitchen levothyroxine (SYNTHROID, LEVOTHROID) 125 MCG tablet Take 125 mcg by mouth daily.    Marland Kitchen losartan (COZAAR) 100 MG tablet Take 100 mg by mouth at bedtime.    . magnesium oxide (MAG-OX) 400 MG tablet Take 400 mg by mouth 2 (two) times daily.    Marland Kitchen omeprazole (PRILOSEC) 20 MG capsule Take 20 mg by mouth daily.    . prochlorperazine (COMPAZINE) 10 MG tablet Take 10 mg by mouth every 6 (six) hours as needed for nausea or vomiting.    . ranitidine (ZANTAC) 150 MG tablet take 1 tablet by mouth twice a day    . Umeclidinium Bromide (INCRUSE ELLIPTA) 62.5 MCG/INH AEPB Inhale 1 puff into the lungs daily. 30 each 5   No current facility-administered medications for this visit.    OBJECTIVE: Filed Vitals:  06/18/15 1210  BP: 114/75  Pulse: 57  Temp: 97.6 F (36.4 C)  Resp: 18     Body mass index is 39.73 kg/(m^2).    ECOG FS:0 - Asymptomatic  General: Well-developed, well-nourished, no acute distress. Eyes: Anicteric sclera. HEENT: Normocephalic, moist mucous membranes, clear oropharnyx. No palpable lymphadenopathy, thyroid midline without nodules. Lungs: Clear to auscultation bilaterally. Heart: Regular rate and rhythm. No rubs, murmurs, or gallops. Abdomen: Soft, nontender, nondistended. No organomegaly noted, normoactive bowel sounds. Musculoskeletal: No edema, cyanosis, or clubbing. Neuro: Alert, answering all questions appropriately. Cranial nerves grossly intact. Skin: No rashes or  petechiae noted. Psych: Normal affect.   LAB RESULTS:  Lab Results  Component Value Date   NA 134* 06/18/2015   K 3.8 06/18/2015   CL 102 06/18/2015   CO2 24 06/18/2015   GLUCOSE 137* 06/18/2015   BUN 18 06/18/2015   CREATININE 1.31* 06/18/2015   CALCIUM 9.0 06/18/2015   PROT 7.4 06/18/2015   ALBUMIN 4.3 06/18/2015   AST 19 06/18/2015   ALT 21 06/18/2015   ALKPHOS 78 06/18/2015   BILITOT 0.8 06/18/2015   GFRNONAA 56* 06/18/2015   GFRAA >60 06/18/2015    Lab Results  Component Value Date   WBC 8.4 06/18/2015   NEUTROABS 6.8* 06/18/2015   HGB 16.7 06/18/2015   HCT 47.3 06/18/2015   MCV 94.3 06/18/2015   PLT 155 06/18/2015     STUDIES: No results found.  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 Head and neck cancer:  Ct scan results reviewed independently with no obvious evidence of recurrence. Previously noted lymph node on the left is improving. No intervention is needed at this time.  Continue follow up with ENT as scheduled.  Return to clinic in 3 months for repeat laboratory work and further evaluation. Will set up a neck CT at that time. If there is continue improved of lymphadenopathy, can discontinue imaging at that time. 2. Dysphasia: Resolved. Continue Carafate as needed. 3. Pulmonary nodule: Per pulmonology, scan 3 months from last scan. Scan scheduled for the first week of March. Patient has appt with pulmonology on July 20, 2015. 4.  Thrombocytopenia: Resolved. 5.  Elevated creatinine: Mild. Monitor.   Patient expressed understanding and was in agreement with this plan. He also understands that He can call clinic at any time with any questions, concerns, or complaints.   Cancer of tonsil   Staging form: Pharynx - Oropharynx, AJCC 7th Edition     Clinical stage from 11/03/2014: Stage II (T2, N0, M0) - Signed by Lloyd Huger, MD on 11/03/2014   Mayra Reel, NP   06/18/2015 2:04 PM   Patient seen and  evaluated independently and I agree with the assessment and plan as dictated above.  Lloyd Huger, MD 06/21/2015 8:03 AM

## 2015-06-18 NOTE — Progress Notes (Signed)
Patient does not offer any problems today.  

## 2015-07-02 ENCOUNTER — Ambulatory Visit
Admission: RE | Admit: 2015-07-02 | Discharge: 2015-07-02 | Disposition: A | Discharge status: Home / Self Care | Payer: Medicare HMO | Source: Ambulatory Visit | Attending: Oncology | Admitting: Oncology

## 2015-07-02 DIAGNOSIS — I2510 Atherosclerotic heart disease of native coronary artery without angina pectoris: Secondary | ICD-10-CM | POA: Diagnosis not present

## 2015-07-02 DIAGNOSIS — C099 Malignant neoplasm of tonsil, unspecified: Secondary | ICD-10-CM | POA: Diagnosis present

## 2015-07-02 DIAGNOSIS — J432 Centrilobular emphysema: Secondary | ICD-10-CM | POA: Diagnosis not present

## 2015-07-02 DIAGNOSIS — K8020 Calculus of gallbladder without cholecystitis without obstruction: Secondary | ICD-10-CM | POA: Insufficient documentation

## 2015-07-02 MED ORDER — IOHEXOL 300 MG/ML  SOLN
75.0000 mL | Freq: Once | INTRAMUSCULAR | Status: AC | PRN
  Administered 2015-07-02: 75 mL via INTRAVENOUS

## 2015-07-20 ENCOUNTER — Encounter: Payer: Self-pay | Admitting: Internal Medicine

## 2015-07-20 ENCOUNTER — Ambulatory Visit (INDEPENDENT_AMBULATORY_CARE_PROVIDER_SITE_OTHER): Payer: Medicare HMO | Admitting: Internal Medicine

## 2015-07-20 VITALS — BP 120/64 | HR 74 | Ht 70.0 in | Wt 282.0 lb

## 2015-07-20 DIAGNOSIS — R911 Solitary pulmonary nodule: Secondary | ICD-10-CM

## 2015-07-20 NOTE — Progress Notes (Signed)
[facility] Consultation      Date: 07/20/2015,   MRN# [id number] [facility] April 09, 1951 Code Status:  Hosp day:'@LENGTHOFSTAYDAYS'$ @ Referring MD: '@ATDPROV'$ @     PCP:      AdmissionWeight: 282 lb (127.914 kg)                 CurrentWeight: 282 lb (127.914 kg) Shawn Lindsey is a 65 y.o. old male seen in consultation for RUL nodule and opacity with COPD at the request of Dr. Alyssa Grove.     CHIEF COMPLAINT:   I have abnormal CT scans   HISTORY OF PRESENT ILLNESS   65 yo white male seen today for h/o RUL nodular opacity previos opacity seen approx 1 year ago on CT chest Repeat CT chest on 07/02/2015  Previous history entails Patient has been hospitalized approx 5  Years ago for pneumonia and resp failure requiring ICU care s/p trach Patient had full recovery since then Patient subsequently had dx of stage 2 tonsillar cancer s/p tonsillectomy and s/p chemo and RXT this past year.patient sees Dr. Grayland Ormond for cancer care  Patient has had no significant weight loss, denies fevers, chills night sweats Patient has COPD and seems to be doing Ok on incruse Patient has no active wheezing today, denies CP,SOB today. Patient still continues to smoke 1 ppd Has 40 pack year tobacco use  RUL opacity seen over the past 1 year Most recent CT chest as noted below  CT chest on 03/12/15 reviewed with patient and family on 07/20/2015 Lungs/Pleura: No pleural effusions. Moderate to advanced changes of centrilobular emphysema identified. Cavitary process within the right upper lobe is again identified. On today's examination this measures 5.0 x 2.2 x 3.6 cm. Previously 5.4 x 2.5 x 3.5 cm.  This is approx the same size over past 1 year  Ct Chest W Contrast  07/02/2015   IMPRESSION: 1. Further enlargement of the cavitary process within the posterior right upper lobe. Increase in size of adjacent noncalcified nodules. Although this lesion could be neoplastic in origin,  I would favor an infectious or inflammatory process. Bronchoscopy may be helpful. 2. Diffuse centrilobular emphysema. 3. Diffuse coronary artery calcifications. 4. Incidental 14 mm gallstone within the gallbladder. \   Current Medication:  Current outpatient prescriptions:  .  amLODipine (NORVASC) 10 MG tablet, Take 10 mg by mouth daily., Disp: , Rfl:  .  aspirin 81 MG tablet, Take 81 mg by mouth daily., Disp: , Rfl:  .  atorvastatin (LIPITOR) 80 MG tablet, Take 80 mg by mouth daily. In the morning, Disp: , Rfl:  .  Cholecalciferol 2000 UNITS CAPS, Take by mouth daily., Disp: , Rfl:  .  FLUoxetine (PROZAC) 10 MG capsule, Take 10 mg by mouth daily., Disp: , Rfl:  .  gabapentin (NEURONTIN) 100 MG capsule, Take 400 mg by mouth daily. In the evening, Disp: , Rfl:  .  ibuprofen (ADVIL,MOTRIN) 200 MG tablet, Take 400 mg by mouth daily. In the morning, Disp: , Rfl:  .  levETIRAcetam (KEPPRA) 500 MG tablet, Take 500 mg by mouth 2 (two) times daily., Disp: , Rfl:  .  levothyroxine (SYNTHROID, LEVOTHROID) 125 MCG tablet, Take 125 mcg by mouth daily., Disp: , Rfl:  .  losartan (COZAAR) 100 MG tablet, Take 100 mg by mouth at bedtime., Disp: , Rfl:  .  magnesium oxide (MAG-OX) 400 MG tablet, Take 400 mg by mouth 2 (two) times daily., Disp: , Rfl:  .  omeprazole (PRILOSEC) 20  MG capsule, Take 20 mg by mouth daily., Disp: , Rfl:  .  prochlorperazine (COMPAZINE) 10 MG tablet, Take 10 mg by mouth every 6 (six) hours as needed for nausea or vomiting., Disp: , Rfl:  .  ranitidine (ZANTAC) 150 MG tablet, take 1 tablet by mouth twice a day, Disp: , Rfl:  .  Umeclidinium Bromide (INCRUSE ELLIPTA) 62.5 MCG/INH AEPB, Inhale 1 puff into the lungs daily., Disp: 30 each, Rfl: 5 .  VENTOLIN HFA 108 (90 Base) MCG/ACT inhaler, , Disp: , Rfl: 1    ALLERGIES   No known allergies     REVIEW OF SYSTEMS   Review of Systems  Constitutional: Negative for fever, chills and weight loss.  HENT: Negative.  Negative for  congestion.   Respiratory: Negative for cough, hemoptysis, sputum production, shortness of breath and wheezing.   Cardiovascular: Negative for chest pain, palpitations and orthopnea.  Gastrointestinal: Negative for heartburn, nausea and vomiting.  Skin: Negative for rash.  Psychiatric/Behavioral: The patient is not nervous/anxious.   All other systems reviewed and are negative.    VS: BP 120/64 mmHg  Pulse 74  Ht '5\' 10"'$  (1.778 m)  Wt 282 lb (127.914 kg)  BMI 40.46 kg/m2  SpO2 96%     PHYSICAL EXAM  Physical Exam  Constitutional: He is oriented to person, place, and time. He appears well-developed and well-nourished. No distress.  Eyes: EOM are normal. Pupils are equal, round, and reactive to light.  Neck: Neck supple.  Cardiovascular: Normal rate, regular rhythm and normal heart sounds.   No murmur heard. Pulmonary/Chest: Effort normal and breath sounds normal. No stridor. No respiratory distress. He has no wheezes.  Musculoskeletal: Normal range of motion. He exhibits no edema.  Neurological: He is alert and oriented to person, place, and time.  Skin: Skin is warm. He is not diaphoretic.  Psychiatric: He has a normal mood and affect.            IMAGING    Ct Chest W Contrast  07/02/2015  CLINICAL DATA:  Followup lung nodule, history of left tonsillar carcinoma with resection in December of 2015, tobacco use EXAM: CT CHEST WITH CONTRAST TECHNIQUE: Multidetector CT imaging of the chest was performed during intravenous contrast administration. CONTRAST:  58m OMNIPAQUE IOHEXOL 300 MG/ML  SOLN COMPARISON:  CT chest of 03/12/2015 and 05/12/2014 FINDINGS: Diffuse changes of centrilobular emphysema again are noted. The cavitary process within the periphery of the right upper lobe is now slightly more extensive with more cavitary portions noted inferiorly and medially. No soft tissue mass is associated with this cavitary lesion, and therefore an infectious or inflammatory etiology  would be the primary consideration. There is a nodular lesion anterior to this cavitary process measuring 8 mm in diameter on image 26, previously measuring 5 mm on CT of 03/12/2015. A vague nodular opacity peripherally in the right upper lobe on image 16 appears more prominent measuring 16 mm compared to 9 mm previously. In view of the lack of a solid lesion, bronchoscopy may be helpful for further assessment. On the left, the previously noted 4 mm nodule within the anterior left lower lobe is not as well seen on the current study. No pleural effusion is seen. The central airway is patent. On soft tissue window images, the thyroid gland is unremarkable. The thoracic aorta opacifies and there is moderate atherosclerotic change of the aortic arch and descending thoracic aorta. Diffuse coronary artery calcifications are noted. No mediastinal or hilar adenopathy is seen with  only small mediastinal lymph nodes remaining stable. Imaging of the upper abdomen shows incidental gallstone of 14 mm in diameter there are diffuse degenerative changes throughout the thoracic spine. IMPRESSION: 1. Further enlargement of the cavitary process within the posterior right upper lobe. Increase in size of adjacent noncalcified nodules. Although this lesion could be neoplastic in origin, I would favor an infectious or inflammatory process. Bronchoscopy may be helpful. 2. Diffuse centrilobular emphysema. 3. Diffuse coronary artery calcifications. 4. Incidental 14 mm gallstone within the gallbladder. Electronically Signed   By: Ivar Drape M.D.   On: 07/02/2015 13:23        ASSESSMENT/PLAN   65 yo white male with COPD Stage A with persistent RUL nodular opacity  1.COPD -will hold incurse and assess resp statsu, will need PFT's at next visit -albuterol as needed -smoking cessation strongly advised  2.Will repeat CT chest in 2 months and will assess for Bronch and ENB if needed, Patient was satisfied with this plan  The  Risks and Benefits of the Bronchoscopy with ENB were explained to patient/family and I have discussed the risk for acute bleeding, increased chance of infection, increased chance of respiratory failure and cardiac arrest and death. The patient/family understand the risks and benefits. Will re-evaluate this procedure after assessment at next visit.  Follow up in 2 months after Ct chest  The Patient requires high complexity decision making for assessment and support, frequent evaluation and titration of therapies, application of advanced monitoring technologies and extensive interpretation of multiple databases.  Patient/Family are satisfied with Plan of action and management. All questions answered  Corrin Parker, M.D.  [facility]  Medical Director [place] Director [facility] Cardio-Pulmonary Department

## 2015-07-20 NOTE — Patient Instructions (Signed)
Pulmonary Nodule A pulmonary nodule is a small, round growth of tissue in the lung. Pulmonary nodules can range in size from less than 1/5 inch (4 mm) to a little bigger than an inch (25 mm). Most pulmonary nodules are detected when imaging tests of the lung are being performed for a different problem. Pulmonary nodules are usually not cancerous (benign). However, some pulmonary nodules are cancerous (malignant). Follow-up treatment or testing is based on the size of the pulmonary nodule and your risk of getting lung cancer.  CAUSES Benign pulmonary nodules can be caused by various things. Some of the causes include:   Bacterial, fungal, or viral infections. This is usually an old infection that is no longer active, but it can sometimes be a current, active infection.  A benign mass of tissue.  Inflammation from conditions such as rheumatoid arthritis.   Abnormal blood vessels in the lungs. Malignant pulmonary nodules can result from lung cancer or from cancers that spread to the lung from other places in the body. SIGNS AND SYMPTOMS Pulmonary nodules usually do not cause symptoms. DIAGNOSIS Most often, pulmonary nodules are found incidentally when an X-ray or CT scan is performed to look for some other problem in the lung area. To help determine whether a pulmonary nodule is benign or malignant, your health care provider will take a medical history and order a variety of tests. Tests done may include:   Blood tests.  A skin test called a tuberculin test. This test is used to determine if you have been exposed to the germ that causes tuberculosis.   Chest X-rays. If possible, a new X-ray may be compared with X-rays you have had in the past.   CT scan. This test shows smaller pulmonary nodules more clearly than an X-ray.   Positron emission tomography (PET) scan. In this test, a safe amount of a radioactive substance is injected into the bloodstream. Then, the scan takes a picture of  the pulmonary nodule. The radioactive substance is eliminated from your body in your urine.   Biopsy. A tiny piece of the pulmonary nodule is removed so it can be checked under a microscope. TREATMENT  Pulmonary nodules that are benign normally do not require any treatment because they usually do not cause symptoms or breathing problems. Your health care provider may want to monitor the pulmonary nodule through follow-up CT scans. The frequency of these CT scans will vary based on the size of the nodule and the risk factors for lung cancer. For example, CT scans will need to be done more frequently if the pulmonary nodule is larger and if you have a history of smoking and a family history of cancer. Further testing or biopsies may be done if any follow-up CT scan shows that the size of the pulmonary nodule has increased. HOME CARE INSTRUCTIONS  Only take over-the-counter or prescription medicines as directed by your health care provider.  Keep all follow-up appointments with your health care provider. SEEK MEDICAL CARE IF:  You have trouble breathing when you are active.   You feel sick or unusually tired.   You do not feel like eating.   You lose weight without trying to.   You develop chills or night sweats.  SEEK IMMEDIATE MEDICAL CARE IF:  You cannot catch your breath, or you begin wheezing.   You cannot stop coughing.   You cough up blood.   You become dizzy or feel like you are going to pass out.   You   have sudden chest pain.   You have a fever or persistent symptoms for more than 2-3 days.   You have a fever and your symptoms suddenly get worse. MAKE SURE YOU:  Understand these instructions.  Will watch your condition.  Will get help right away if you are not doing well or get worse.   This information is not intended to replace advice given to you by your health care provider. Make sure you discuss any questions you have with your health care  provider.   Document Released: 02/13/2009 Document Revised: 12/19/2012 Document Reviewed: 10/08/2012 Elsevier Interactive Patient Education 2016 [employer]  

## 2015-09-17 ENCOUNTER — Ambulatory Visit: Payer: Medicare HMO | Admitting: Family Medicine

## 2015-09-17 ENCOUNTER — Other Ambulatory Visit: Payer: Medicare HMO

## 2015-09-17 ENCOUNTER — Ambulatory Visit: Payer: Medicare HMO | Admitting: Oncology

## 2015-09-17 ENCOUNTER — Ambulatory Visit
Admission: RE | Admit: 2015-09-17 | Discharge: 2015-09-17 | Disposition: A | Discharge status: Home / Self Care | Payer: Medicare HMO | Source: Ambulatory Visit | Attending: Radiation Oncology | Admitting: Radiation Oncology

## 2015-09-17 ENCOUNTER — Encounter: Payer: Self-pay | Admitting: Radiation Oncology

## 2015-09-17 VITALS — BP 138/78 | HR 65 | Temp 96.4°F | Ht 70.0 in | Wt 286.0 lb

## 2015-09-17 DIAGNOSIS — C099 Malignant neoplasm of tonsil, unspecified: Secondary | ICD-10-CM

## 2015-09-17 NOTE — Progress Notes (Signed)
Radiation Oncology Follow up Note  Name: Shawn Lindsey   Date:   09/17/2015 MRN:  [id number] DOB: [birth year]    This 65 y.o. male presents to the clinic today for follow-up for stage II (T2 N1 M0) squamous cell carcinoma the tonsillar fossa status post resection and adjuvant chemoradiation.  REFERRING PROVIDER: No ref. provider found  HPI: Patient is a 65 year old male now out 1 year having completed combined modality treatment with chemoradiation for stage II (T2 N1 M0) squamous cell carcinoma the tonsillar fossa status post resection. Seen today in routine follow-up he is doing well. Specifically denies any headache neck pain or dysphagia. Has not really been regularly followed with ENT not sure the reason for that.Marland Kitchen He is being followed by pulmonology for enlargement of the cavitary process in the posterior right upper lobe. This appears to be inflammatory in nature although repeat CT scan is scheduled for the end of this month. He specifically denies cough hemoptysis or chest tightness. Patient had a head and neck CT scan performed back in November 2016 sewing no evidence of disease  COMPLICATIONS OF TREATMENT: none  FOLLOW UP COMPLIANCE: keeps appointments   PHYSICAL EXAM:  BP 138/78 mmHg  Pulse 65  Temp(Src) 96.4 F (35.8 C) (Tympanic)  Ht '5\' 10"'$  (1.778 m)  Wt 286 lb 0.8 oz (129.75 kg)  BMI 41.04 kg/m2 Well-developed obese male in NAD oral cavity is clear no oral mucosal lesions are identified tonsillar fossa was are within normal limits. Indirect mirror examination shows upper airway clear vallecula and base of tongue within normal limits. Neck is clear without evidence of subject gastric cervical or supraclavicular adenopathy. Well-developed well-nourished patient in NAD. HEENT reveals PERLA, EOMI, discs not visualized.  Oral cavity is clear. No oral mucosal lesions are identified. Neck is clear without evidence of cervical or supraclavicular adenopathy. Lungs are clear to A&P.  Cardiac examination is essentially unremarkable with regular rate and rhythm without murmur rub or thrill. Abdomen is benign with no organomegaly or masses noted. Motor sensory and DTR levels are equal and symmetric in the upper and lower extremities. Cranial nerves II through XII are grossly intact. Proprioception is intact. No peripheral adenopathy or edema is identified. No motor or sensory levels are noted. Crude visual fields are within normal range.  RADIOLOGY RESULTS: Previous CT scan of chest is reviewed will review his CT scan being performed at the end of this month.  PLAN: Present time from head and neck standpoint he is doing well with no evidence of disease. Will follow-up and review his next CT scan of the chest for this right upper lobe lesion which is being followed by pulmonology. I did discuss with the patient if this turns out to be a early-stage lung cancer it is possible to do SB RT. I've asked to see him back in 6 months for follow-up. Patient knows to call sooner with any concerns.  I would like to take this opportunity for allowing me to participate in the care of your patient.Armstead Peaks., MD

## 2015-09-17 NOTE — Progress Notes (Signed)
Patient here for follow up no complaints today.

## 2015-09-25 ENCOUNTER — Ambulatory Visit
Admission: RE | Admit: 2015-09-25 | Discharge: 2015-09-25 | Disposition: A | Discharge status: Home / Self Care | Payer: Medicare HMO | Source: Ambulatory Visit | Attending: Internal Medicine | Admitting: Internal Medicine

## 2015-09-25 DIAGNOSIS — R918 Other nonspecific abnormal finding of lung field: Secondary | ICD-10-CM | POA: Diagnosis not present

## 2015-09-25 DIAGNOSIS — R911 Solitary pulmonary nodule: Secondary | ICD-10-CM

## 2015-09-29 ENCOUNTER — Ambulatory Visit (INDEPENDENT_AMBULATORY_CARE_PROVIDER_SITE_OTHER): Payer: Medicare HMO | Admitting: Internal Medicine

## 2015-09-29 ENCOUNTER — Encounter: Payer: Self-pay | Admitting: Internal Medicine

## 2015-09-29 ENCOUNTER — Telehealth: Payer: Self-pay | Admitting: Internal Medicine

## 2015-09-29 VITALS — BP 138/86 | HR 62 | Ht 70.0 in | Wt 287.0 lb

## 2015-09-29 DIAGNOSIS — J449 Chronic obstructive pulmonary disease, unspecified: Secondary | ICD-10-CM

## 2015-09-29 DIAGNOSIS — J984 Other disorders of lung: Secondary | ICD-10-CM

## 2015-09-29 DIAGNOSIS — R918 Other nonspecific abnormal finding of lung field: Secondary | ICD-10-CM | POA: Insufficient documentation

## 2015-09-29 NOTE — Progress Notes (Signed)
[facility] Consultation      Date: 09/29/2015,   MRN# [id number] Shawn Lindsey [birth year] Code Status:  Hosp day:'@LENGTHOFSTAYDAYS'$ @ Referring MD: '@ATDPROV'$ @     PCP:      Admission                  Current  Shawn Lindsey is a 65 y.o. old male seen in consultation for RUL nodule and opacity with COPD at the request of Dr. Alyssa Grove.     CHIEF COMPLAINT:   I have abnormal CT scans   HISTORY OF PRESENT ILLNESS   65 yo white male seen today for h/o RUL nodular opacity previos opacity seen approx 1 year ago on CT chest Repeat CT chest on 07/02/2015 RUL cavitary lesions measures 5.0 x 2.2 x 3.6 cm. Previously 5.4 x 2.5 x 3.5 cm. Repeat CT chest 09/25/2015 Cavitary process within the right upper lobe is again identified. 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 patient sees Dr. Grayland Ormond for cancer care  Patient has had no significant weight loss, denies fevers, chills night sweats Patient has COPD and seems to be doing Ok on incruse Patient has no active wheezing today, denies CP,SOB today.   CT chest on 03/12/15 reviewed with patient and family Lungs/Pleura: No pleural effusions. Moderate to advanced changes of centrilobular emphysema identified. Cavitary process within the right upper lobe is again identified.  measures 5.0 x 2.2 x 3.6 cm. Previously 5.4 x 2.5 x 3.5 cm.  This is approx the same size over past 1 year  CT chest 09/25/2015 Cavitary process within the right upper lobe is again identified. Is has increased in size to 7.2 x 2.6 cm       Current Medication:  Current outpatient prescriptions:  .  amLODipine (NORVASC) 10 MG tablet, Take 10 mg by mouth daily., Disp: , Rfl:  .   aspirin 81 MG tablet, Take 81 mg by mouth daily., Disp: , Rfl:  .  atorvastatin (LIPITOR) 80 MG tablet, Take 80 mg by mouth daily. In the morning, Disp: , Rfl:  .  Cholecalciferol 2000 UNITS CAPS, Take by mouth daily., Disp: , Rfl:  .  FLUoxetine (PROZAC) 10 MG capsule, Take 10 mg by mouth daily., Disp: , Rfl:  .  gabapentin (NEURONTIN) 100 MG capsule, Take 400 mg by mouth daily. In the evening, Disp: , Rfl:  .  ibuprofen (ADVIL,MOTRIN) 200 MG tablet, Take 400 mg by mouth daily. In the morning, Disp: , Rfl:  .  levETIRAcetam (KEPPRA) 500 MG tablet, Take 500 mg by mouth 2 (two) times daily., Disp: , Rfl:  .  levothyroxine (SYNTHROID, LEVOTHROID) 125 MCG tablet, Take 125 mcg by mouth daily., Disp: , Rfl:  .  losartan (COZAAR) 100 MG tablet, Take 100 mg by mouth at bedtime., Disp: , Rfl:  .  magnesium oxide (MAG-OX) 400 MG tablet, Take 400 mg by mouth 2 (two) times daily., Disp: , Rfl:  .  omeprazole (PRILOSEC) 20 MG capsule, Take 20 mg by mouth daily., Disp: , Rfl:  .  prochlorperazine (COMPAZINE) 10 MG tablet, Take 10 mg by mouth every 6 (six) hours as needed for nausea or vomiting., Disp: , Rfl:  .  Umeclidinium  Bromide (INCRUSE ELLIPTA) 62.5 MCG/INH AEPB, Inhale 1 puff into the lungs daily., Disp: 30 each, Rfl: 5 .  VENTOLIN HFA 108 (90 Base) MCG/ACT inhaler, , Disp: , Rfl: 1    ALLERGIES   No known allergies     REVIEW OF SYSTEMS   Review of Systems  Constitutional: Negative for fever, chills and weight loss.  HENT: Negative.  Negative for congestion.   Respiratory: Negative for cough, hemoptysis, sputum production, shortness of breath and wheezing.   Cardiovascular: Negative for chest pain, palpitations and orthopnea.  Gastrointestinal: Negative for heartburn, nausea and vomiting.  Skin: Negative for rash.  Psychiatric/Behavioral: The patient is not nervous/anxious.   All other systems reviewed and are negative.   BP 138/86 mmHg  Pulse 62  Ht '5\' 10"'$  (1.778 m)  Wt 287 lb  (130.182 kg)  BMI 41.18 kg/m2  SpO2 96%     PHYSICAL EXAM  Physical Exam  Constitutional: He is oriented to person, place, and time. He appears well-developed and well-nourished. No distress.  Eyes: EOM are normal. Pupils are equal, round, and reactive to light.  Neck: Neck supple.  Cardiovascular: Normal rate, regular rhythm and normal heart sounds.   No murmur heard. Pulmonary/Chest: Effort normal and breath sounds normal. No stridor. No respiratory distress. He has no wheezes.  Musculoskeletal: Normal range of motion. He exhibits no edema.  Neurological: He is alert and oriented to person, place, and time.  Skin: Skin is warm. He is not diaphoretic.  Psychiatric: He has a normal mood and affect.            IMAGING    Ct Chest Wo Contrast  09/25/2015  CLINICAL DATA:  Follow-up pulmonary nodule. History of tonsillar carcinoma with resection 04/20/2014. EXAM: CT CHEST WITHOUT CONTRAST TECHNIQUE: Multidetector CT imaging of the chest was performed following the standard protocol without IV contrast. COMPARISON:  Chest CT 07/02/2015.  Chest CT 05/12/2014 FINDINGS: Mediastinum/Nodes: Normal heart size. No pericardial effusion. Aorta main pulmonary artery normal in caliber. Dense coronary arterial calcifications. Small hiatal hernia. Lungs/Pleura: Central airways are patent. Re- demonstrated extensive centrilobular and paraseptal emphysematous change. Interval increase in size of irregular cavitary consolidative opacity within the subpleural right upper lobe of measuring 7.2 x 2.6 cm (image 47; series 3). Additional irregular cavitary nodular opacity within the right upper lobe (image 57; series 3) measures 10 mm, slightly increased from prior were it measured 8 mm. Grossly unchanged 1.6 cm irregular cavitary nodular opacity within the subpleural right upper lobe (image 34; series 3). Dependent ground-glass opacities within the bilateral lower lobes. No pleural effusion or pneumothorax.  Persistent 7 mm left upper lobe nodule (image 63; series 5). Upper abdomen: Liver is low in attenuation compatible with steatosis. Small stone within the gallbladder lumen. Musculoskeletal: Thoracic spine degenerative changes. No aggressive or acute appearing osseous lesions. IMPRESSION: Interval increase in size of cavitary opacities within the right upper lobe which are worrisome for malignancy (potentially metastatic disease) given the interval progression and central cavitation. Additionally there is a persistent 7 mm subpleural left upper lobe nodule which may be infectious/inflammatory or neoplastic in etiology. These results will be called to the ordering clinician or representative by the Radiologist Assistant, and communication documented in the PACS or zVision Dashboard. Electronically Signed   By: Lovey Newcomer M.D.   On: 09/25/2015 15:35    Images reviewed 09/29/2015     ASSESSMENT/PLAN   65 yo white male with COPD Stage A with persistent RUL nodular cavitary opacity that  seems to have increased in size Will need to discuss his case and review findings in Thoracic Oncology Conference on Thursday afternoon and will contact patient regarging plan of care and management  1.COPD --continue incruse -albuterol as needed -smoking cessation strongly advised -check PFT's and 6MWT -check overnight pulse oximetry  2. RUL cavitary lesion -will discuss further plan of action with Thoracic Oncology Team   Will need to discuss and review case with Oncologist, [place], CT surgeon and Radiologist  Will plan for ENB or PET scan.  The Patient requires high complexity decision making for assessment and support, frequent evaluation and titration of therapies, application of advanced monitoring technologies and extensive interpretation of multiple databases.  Patient/Family are satisfied with Plan of action and management. All questions answered  Corrin Parker, M.D.  [facility]  Medical Director [place] Director [facility] Cardio-Pulmonary Department

## 2015-09-29 NOTE — Telephone Encounter (Signed)
Called the number listed and that is [facility] Radiology. Shawn Lindsey wants to make sure DK sees ct results. Pt has appt this am. Nothing further needed.

## 2015-09-29 NOTE — Patient Instructions (Signed)
Chronic Obstructive Pulmonary Disease Chronic obstructive pulmonary disease (COPD) is a common lung condition in which airflow from the lungs is limited. COPD is a general term that can be used to describe many different lung problems that limit airflow, including both chronic bronchitis and emphysema. If you have COPD, your lung function will probably never return to normal, but there are measures you can take to improve lung function and make yourself feel better. CAUSES   Smoking (common).  Exposure to secondhand smoke.  Genetic problems.  Chronic inflammatory lung diseases or recurrent infections. SYMPTOMS  Shortness of breath, especially with physical activity.  Deep, persistent (chronic) cough with a large amount of thick mucus.  Wheezing.  Rapid breaths (tachypnea).  Gray or bluish discoloration (cyanosis) of the skin, especially in your fingers, toes, or lips.  Fatigue.  Weight loss.  Frequent infections or episodes when breathing symptoms become much worse (exacerbations).  Chest tightness. DIAGNOSIS Your health care provider will take a medical history and perform a physical examination to diagnose COPD. Additional tests for COPD may include:  Lung (pulmonary) function tests.  Chest X-ray.  CT scan.  Blood tests. TREATMENT  Treatment for COPD may include:  Inhaler and nebulizer medicines. These help manage the symptoms of COPD and make your breathing more comfortable.  Supplemental oxygen. Supplemental oxygen is only helpful if you have a low oxygen level in your blood.  Exercise and physical activity. These are beneficial for nearly all people with COPD.  Lung surgery or transplant.  Nutrition therapy to gain weight, if you are underweight.  Pulmonary rehabilitation. This may involve working with a team of health care providers and specialists, such as respiratory, occupational, and physical therapists. HOME CARE INSTRUCTIONS  Take all medicines  (inhaled or pills) as directed by your health care provider.  Avoid over-the-counter medicines or cough syrups that dry up your airway (such as antihistamines) and slow down the elimination of secretions unless instructed otherwise by your health care provider.  If you are a smoker, the most important thing that you can do is stop smoking. Continuing to smoke will cause further lung damage and breathing trouble. Ask your health care provider for help with quitting smoking. He or she can direct you to community resources or hospitals that provide support.  Avoid exposure to irritants such as smoke, chemicals, and fumes that aggravate your breathing.  Use oxygen therapy and pulmonary rehabilitation if directed by your health care provider. If you require home oxygen therapy, ask your health care provider whether you should purchase a pulse oximeter to measure your oxygen level at home.  Avoid contact with individuals who have a contagious illness.  Avoid extreme temperature and humidity changes.  Eat healthy foods. Eating smaller, more frequent meals and resting before meals may help you maintain your strength.  Stay active, but balance activity with periods of rest. Exercise and physical activity will help you maintain your ability to do things you want to do.  Preventing infection and hospitalization is very important when you have COPD. Make sure to receive all the vaccines your health care provider recommends, especially the pneumococcal and influenza vaccines. Ask your health care provider whether you need a pneumonia vaccine.  Learn and use relaxation techniques to manage stress.  Learn and use controlled breathing techniques as directed by your health care provider. Controlled breathing techniques include:  Pursed lip breathing. Start by breathing in (inhaling) through your nose for 1 second. Then, purse your lips as if you were   going to whistle and breathe out (exhale) through the  pursed lips for 2 seconds.  Diaphragmatic breathing. Start by putting one hand on your abdomen just above your waist. Inhale slowly through your nose. The hand on your abdomen should move out. Then purse your lips and exhale slowly. You should be able to feel the hand on your abdomen moving in as you exhale.  Learn and use controlled coughing to clear mucus from your lungs. Controlled coughing is a series of short, progressive coughs. The steps of controlled coughing are: 1. Lean your head slightly forward. 2. Breathe in deeply using diaphragmatic breathing. 3. Try to hold your breath for 3 seconds. 4. Keep your mouth slightly open while coughing twice. 5. Spit any mucus out into a tissue. 6. Rest and repeat the steps once or twice as needed. SEEK MEDICAL CARE IF:  You are coughing up more mucus than usual.  There is a change in the color or thickness of your mucus.  Your breathing is more labored than usual.  Your breathing is faster than usual. SEEK IMMEDIATE MEDICAL CARE IF:  You have shortness of breath while you are resting.  You have shortness of breath that prevents you from:  Being able to talk.  Performing your usual physical activities.  You have chest pain lasting longer than 5 minutes.  Your skin color is more cyanotic than usual.  You measure low oxygen saturations for longer than 5 minutes with a pulse oximeter. MAKE SURE YOU:  Understand these instructions.  Will watch your condition.  Will get help right away if you are not doing well or get worse.   This information is not intended to replace advice given to you by your health care provider. Make sure you discuss any questions you have with your health care provider.   Document Released: 01/26/2005 Document Revised: 05/09/2014 Document Reviewed: 12/13/2012 [employer] Interactive Patient Education 2016 [employer]  

## 2015-09-29 NOTE — Telephone Encounter (Signed)
Radiology calling from [facility] asking if Dr Mortimer Fries can read the Chest X-Ray today  It is a urgent.

## 2015-09-30 ENCOUNTER — Inpatient Hospital Stay: Payer: Medicare HMO

## 2015-09-30 ENCOUNTER — Inpatient Hospital Stay: Payer: Medicare HMO | Admitting: Oncology

## 2015-10-01 ENCOUNTER — Telehealth: Payer: Self-pay | Admitting: *Deleted

## 2015-10-01 DIAGNOSIS — R911 Solitary pulmonary nodule: Secondary | ICD-10-CM

## 2015-10-01 NOTE — Telephone Encounter (Signed)
Spoke with pt and informed him of DK response. Order placed for CT chest.

## 2015-10-01 NOTE — Telephone Encounter (Signed)
Per DK we will not proceed at this time with the bronch or PET. He would like for the patient to repeat CT chest w/o in 3 months.  LMOM for pt to return call.

## 2015-10-05 ENCOUNTER — Inpatient Hospital Stay: Payer: Medicare HMO | Attending: Oncology

## 2015-10-05 ENCOUNTER — Inpatient Hospital Stay (HOSPITAL_BASED_OUTPATIENT_CLINIC_OR_DEPARTMENT_OTHER): Payer: Medicare HMO | Admitting: Oncology

## 2015-10-05 VITALS — BP 143/78 | HR 62 | Temp 97.5°F | Resp 18 | Wt 287.7 lb

## 2015-10-05 DIAGNOSIS — Z7982 Long term (current) use of aspirin: Secondary | ICD-10-CM | POA: Insufficient documentation

## 2015-10-05 DIAGNOSIS — J449 Chronic obstructive pulmonary disease, unspecified: Secondary | ICD-10-CM | POA: Insufficient documentation

## 2015-10-05 DIAGNOSIS — Z85818 Personal history of malignant neoplasm of other sites of lip, oral cavity, and pharynx: Secondary | ICD-10-CM | POA: Insufficient documentation

## 2015-10-05 DIAGNOSIS — I10 Essential (primary) hypertension: Secondary | ICD-10-CM | POA: Insufficient documentation

## 2015-10-05 DIAGNOSIS — R944 Abnormal results of kidney function studies: Secondary | ICD-10-CM

## 2015-10-05 DIAGNOSIS — Z79899 Other long term (current) drug therapy: Secondary | ICD-10-CM

## 2015-10-05 DIAGNOSIS — J984 Other disorders of lung: Secondary | ICD-10-CM

## 2015-10-05 DIAGNOSIS — K8020 Calculus of gallbladder without cholecystitis without obstruction: Secondary | ICD-10-CM | POA: Diagnosis not present

## 2015-10-05 DIAGNOSIS — E119 Type 2 diabetes mellitus without complications: Secondary | ICD-10-CM | POA: Diagnosis not present

## 2015-10-05 DIAGNOSIS — C099 Malignant neoplasm of tonsil, unspecified: Secondary | ICD-10-CM

## 2015-10-05 DIAGNOSIS — F17210 Nicotine dependence, cigarettes, uncomplicated: Secondary | ICD-10-CM | POA: Insufficient documentation

## 2015-10-05 DIAGNOSIS — K449 Diaphragmatic hernia without obstruction or gangrene: Secondary | ICD-10-CM | POA: Insufficient documentation

## 2015-10-05 DIAGNOSIS — R911 Solitary pulmonary nodule: Secondary | ICD-10-CM | POA: Insufficient documentation

## 2015-10-05 LAB — BASIC METABOLIC PANEL
Anion gap: 8 (ref 5–15)
BUN: 23 mg/dL — ABNORMAL HIGH (ref 6–20)
CO2: 27 mmol/L (ref 22–32)
Calcium: 9.5 mg/dL (ref 8.9–10.3)
Chloride: 102 mmol/L (ref 101–111)
Creatinine, Ser: 1.42 mg/dL — ABNORMAL HIGH (ref 0.61–1.24)
GFR calc Af Amer: 58 mL/min — ABNORMAL LOW (ref >60)
GFR calc non Af Amer: 50 mL/min — ABNORMAL LOW (ref >60)
Glucose, Bld: 115 mg/dL — ABNORMAL HIGH (ref 65–99)
Potassium: 3.9 mmol/L (ref 3.5–5.1)
Sodium: 137 mmol/L (ref 135–145)

## 2015-10-05 LAB — CBC WITH DIFFERENTIAL/PLATELET
Basophils Absolute: 0.1 10*3/uL (ref 0–0.1)
Basophils Relative: 1 %
Eosinophils Absolute: 0.2 10*3/uL (ref 0–0.7)
Eosinophils Relative: 2 %
HCT: 48 % (ref 40.0–52.0)
Hemoglobin: 16.8 g/dL (ref 13.0–18.0)
Lymphocytes Relative: 11 %
Lymphs Abs: 1.1 10*3/uL (ref 1.0–3.6)
MCH: 33.4 pg (ref 26.0–34.0)
MCHC: 35.1 g/dL (ref 32.0–36.0)
MCV: 95.1 fL (ref 80.0–100.0)
Monocytes Absolute: 0.8 10*3/uL (ref 0.2–1.0)
Monocytes Relative: 8 %
Neutro Abs: 7.8 10*3/uL — ABNORMAL HIGH (ref 1.4–6.5)
Neutrophils Relative %: 78 %
Platelets: 171 10*3/uL (ref 150–440)
RBC: 5.05 MIL/uL (ref 4.40–5.90)
RDW: 13.1 % (ref 11.5–14.5)
WBC: 10 10*3/uL (ref 3.8–10.6)

## 2015-10-05 LAB — TSH: TSH: 4.504 u[IU]/mL — ABNORMAL HIGH (ref 0.350–4.500)

## 2015-10-06 LAB — T4: T4, Total: 8.9 ug/dL (ref 4.5–12.0)

## 2015-10-10 NOTE — Progress Notes (Signed)
[employer]  [phone] [phone]  ID: Shawn Lindsey OB: 02-21-1951  MR#: [id number]  BFX#:[id number]  Patient Care Team: Laurell Josephs, MD as PCP - General (Family Medicine)  CHIEF COMPLAINT:  Chief Complaint  Patient presents with  . Follow-up    INTERVAL HISTORY: Patient returns to clinic today for routine 3 month evaluation and discussion of his imaging results.  He continues to feel well and is asymptomatic. He denies any fevers or recent illnesses. He has no neurologic complaints. He denies any chest pain, cough, hemoptysis, or shortness of breath. He denies any nausea, vomiting, constipation, or diarrhea. He has no urinary complaints. Patient offers no specific complaints today.  REVIEW OF SYSTEMS:   Review of Systems  Constitutional: Negative.   HENT: Negative.        Patient denies dysphagia.  Respiratory: Negative.  Negative for cough, hemoptysis and shortness of breath.   Cardiovascular: Negative.  Negative for chest pain.  Gastrointestinal: Negative.  Negative for abdominal pain.  Genitourinary: Negative.   Musculoskeletal: Negative.   Neurological: Negative.   Psychiatric/Behavioral: Negative.     As per HPI. Otherwise, a complete review of systems is negatve.  PAST MEDICAL HISTORY: Past Medical History  Diagnosis Date  . Cancer (HCC)     tonsil  . Diabetes mellitus without complication ([place])   . Hypertension   . COPD (chronic obstructive pulmonary disease) ([place])     PAST SURGICAL HISTORY: Past Surgical History  Procedure Laterality Date  . Tonsillectomy    . Tracheostomy      FAMILY HISTORY: Reviewed and unchanged.  No reported history of malignancy or chronic disease.     ADVANCED DIRECTIVES:    HEALTH MAINTENANCE: Social History  Substance Use Topics  . Smoking status: Current Every Day Smoker -- 1.00 packs/day for 40 years  . Smokeless tobacco: Not on file  . Alcohol Use: No      Colonoscopy:  PAP:  Bone density:  Lipid panel:  Allergies  Allergen Reactions  . No Known Allergies     Current Outpatient Prescriptions  Medication Sig Dispense Refill  . amLODipine (NORVASC) 10 MG tablet Take 10 mg by mouth daily.    Marland Kitchen aspirin 81 MG tablet Take 81 mg by mouth daily.    Marland Kitchen atorvastatin (LIPITOR) 80 MG tablet Take 80 mg by mouth daily. In the morning    . Cholecalciferol 2000 UNITS CAPS Take by mouth daily.    Marland Kitchen FLUoxetine (PROZAC) 10 MG capsule Take 10 mg by mouth daily.    Marland Kitchen gabapentin (NEURONTIN) 100 MG capsule Take 400 mg by mouth daily. In the evening    . ibuprofen (ADVIL,MOTRIN) 200 MG tablet Take 400 mg by mouth daily. In the morning    . levETIRAcetam (KEPPRA) 500 MG tablet Take 500 mg by mouth 2 (two) times daily.    Marland Kitchen levothyroxine (SYNTHROID, LEVOTHROID) 125 MCG tablet Take 125 mcg by mouth daily.    Marland Kitchen losartan (COZAAR) 100 MG tablet Take 100 mg by mouth at bedtime.    . magnesium oxide (MAG-OX) 400 MG tablet Take 400 mg by mouth 2 (two) times daily.    . Omega-3 Fatty Acids (FISH OIL) 1000 MG CAPS Take by mouth daily.    Marland Kitchen omeprazole (PRILOSEC) 20 MG capsule Take 20 mg by mouth daily.    . prochlorperazine (COMPAZINE) 10 MG tablet Take 10 mg by mouth every 6 (six) hours as needed for nausea or vomiting.    Marland Kitchen Umeclidinium  Bromide (INCRUSE ELLIPTA) 62.5 MCG/INH AEPB Inhale 1 puff into the lungs daily. 30 each 5  . VENTOLIN HFA 108 (90 Base) MCG/ACT inhaler   1   No current facility-administered medications for this visit.    OBJECTIVE: Filed Vitals:   10/05/15 1202  BP: 143/78  Pulse: 62  Temp: 97.5 F (36.4 C)  Resp: 18     Body mass index is 41.28 kg/(m^2).    ECOG FS:0 - Asymptomatic  General: Well-developed, well-nourished, no acute distress. Eyes: Anicteric sclera. HEENT: Normocephalic, moist mucous membranes, clear oropharnyx. No palpable lymphadenopathy, thyroid midline without nodules. Lungs: Clear to auscultation  bilaterally. Heart: Regular rate and rhythm. No rubs, murmurs, or gallops. Abdomen: Soft, nontender, nondistended. No organomegaly noted, normoactive bowel sounds. Musculoskeletal: No edema, cyanosis, or clubbing. Neuro: Alert, answering all questions appropriately. Cranial nerves grossly intact. Skin: No rashes or petechiae noted. Psych: Normal affect.   LAB RESULTS:  Lab Results  Component Value Date   NA 137 10/05/2015   K 3.9 10/05/2015   CL 102 10/05/2015   CO2 27 10/05/2015   GLUCOSE 115* 10/05/2015   BUN 23* 10/05/2015   CREATININE 1.42* 10/05/2015   CALCIUM 9.5 10/05/2015   PROT 7.4 06/18/2015   ALBUMIN 4.3 06/18/2015   AST 19 06/18/2015   ALT 21 06/18/2015   ALKPHOS 78 06/18/2015   BILITOT 0.8 06/18/2015   GFRNONAA 50* 10/05/2015   GFRAA 58* 10/05/2015    Lab Results  Component Value Date   WBC 10.0 10/05/2015   NEUTROABS 7.8* 10/05/2015   HGB 16.8 10/05/2015   HCT 48.0 10/05/2015   MCV 95.1 10/05/2015   PLT 171 10/05/2015     STUDIES: Ct Chest Wo Contrast  09/25/2015  CLINICAL DATA:  Follow-up pulmonary nodule. History of tonsillar carcinoma with resection 04/20/2014. EXAM: CT CHEST WITHOUT CONTRAST TECHNIQUE: Multidetector CT imaging of the chest was performed following the standard protocol without IV contrast. COMPARISON:  Chest CT 07/02/2015.  Chest CT 05/12/2014 FINDINGS: Mediastinum/Nodes: Normal heart size. No pericardial effusion. Aorta main pulmonary artery normal in caliber. Dense coronary arterial calcifications. Small hiatal hernia. Lungs/Pleura: Central airways are patent. Re- demonstrated extensive centrilobular and paraseptal emphysematous change. Interval increase in size of irregular cavitary consolidative opacity within the subpleural right upper lobe of measuring 7.2 x 2.6 cm (image 47; series 3). Additional irregular cavitary nodular opacity within the right upper lobe (image 57; series 3) measures 10 mm, slightly increased from prior were it  measured 8 mm. Grossly unchanged 1.6 cm irregular cavitary nodular opacity within the subpleural right upper lobe (image 34; series 3). Dependent ground-glass opacities within the bilateral lower lobes. No pleural effusion or pneumothorax. Persistent 7 mm left upper lobe nodule (image 63; series 5). Upper abdomen: Liver is low in attenuation compatible with steatosis. Small stone within the gallbladder lumen. Musculoskeletal: Thoracic spine degenerative changes. No aggressive or acute appearing osseous lesions. IMPRESSION: Interval increase in size of cavitary opacities within the right upper lobe which are worrisome for malignancy (potentially metastatic disease) given the interval progression and central cavitation. Additionally there is a persistent 7 mm subpleural left upper lobe nodule which may be infectious/inflammatory or neoplastic in etiology. These results will be called to the ordering clinician or representative by the Radiologist Assistant, and communication documented in the PACS or zVision Dashboard. Electronically Signed   By: Lovey Newcomer M.D.   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 Head and neck cancer:  CTscan results reviewed independently with no obvious evidence of recurrence. No intervention is needed at this time.  Patient states he no longer follows up with ENT.  Return to clinic in 3 months for repeat laboratory work and further evaluation.  2. Dysphasia: Resolved. Continue Carafate as needed. 3. Pulmonary nodule: CT scan results as above with slightly worsening nodules. Patient was discussed at length at cancer conference and it was determined to continue to monitor and repeat CT scan in 3 months. If nodules continued to enlarge, we will consider biopsy at that time. Appreciate pulmonology input. 4.  Thrombocytopenia: Resolved. 5.  Elevated creatinine: Mild. Monitor.  Approximately 30 minutes was spent in  discussion of which greater than 50% was consultation.  Patient expressed understanding and was in agreement with this plan. He also understands that He can call clinic at any time with any questions, concerns, or complaints.   Cancer of tonsil   Staging form: Pharynx - Oropharynx, AJCC 7th Edition     Clinical stage from 11/03/2014: Stage II (T2, N0, M0) - Signed by Lloyd Huger, MD on 11/03/2014   Lloyd Huger, MD   10/10/2015 9:29 AM

## 2015-10-12 ENCOUNTER — Other Ambulatory Visit: Payer: Self-pay | Admitting: *Deleted

## 2015-10-12 ENCOUNTER — Telehealth: Payer: Self-pay | Admitting: *Deleted

## 2015-10-12 DIAGNOSIS — J449 Chronic obstructive pulmonary disease, unspecified: Secondary | ICD-10-CM

## 2015-10-12 MED ORDER — UMECLIDINIUM BROMIDE 62.5 MCG/INH IN AEPB: 1.0000 | INHALATION_SPRAY | Freq: Every day | RESPIRATORY_TRACT | Status: DC

## 2015-10-12 NOTE — Telephone Encounter (Signed)
Pt informed of ONO results. Order placed for home O2.

## 2015-12-31 ENCOUNTER — Ambulatory Visit (INDEPENDENT_AMBULATORY_CARE_PROVIDER_SITE_OTHER): Payer: Medicare HMO | Admitting: *Deleted

## 2015-12-31 DIAGNOSIS — J449 Chronic obstructive pulmonary disease, unspecified: Secondary | ICD-10-CM

## 2015-12-31 LAB — PULMONARY FUNCTION TEST
DL/VA % pred: 60 %
DL/VA: 2.79 ml/min/mmHg/L
DLCO unc % pred: 57 %
DLCO unc: 18.52 ml/min/mmHg
FEF 25-75 Post: 1.75 L/sec
FEF 25-75 Pre: 2.21 L/sec
FEF2575-%Change-Post: -20 %
FEF2575-%Pred-Post: 65 %
FEF2575-%Pred-Pre: 82 %
FEV1-%Change-Post: -4 %
FEV1-%Pred-Post: 85 %
FEV1-%Pred-Pre: 90 %
FEV1-Post: 2.93 L
FEV1-Pre: 3.08 L
FEV1FVC-%Change-Post: -3 %
FEV1FVC-%Pred-Pre: 97 %
FEV6-%Change-Post: -2 %
FEV6-%Pred-Post: 94 %
FEV6-%Pred-Pre: 97 %
FEV6-Post: 4.12 L
FEV6-Pre: 4.24 L
FEV6FVC-%Change-Post: 0 %
FEV6FVC-%Pred-Post: 105 %
FEV6FVC-%Pred-Pre: 104 %
FVC-%Change-Post: -1 %
FVC-%Pred-Post: 91 %
FVC-%Pred-Pre: 92 %
FVC-Post: 4.21 L
FVC-Pre: 4.26 L
Post FEV1/FVC ratio: 70 %
Post FEV6/FVC ratio: 100 %
Pre FEV1/FVC ratio: 72 %
Pre FEV6/FVC Ratio: 100 %

## 2015-12-31 NOTE — Progress Notes (Signed)
SMW performed today. 

## 2015-12-31 NOTE — Progress Notes (Signed)
PFT performed today with Nitrogen washout. 

## 2016-01-01 ENCOUNTER — Ambulatory Visit: Payer: Medicare HMO

## 2016-01-05 ENCOUNTER — Ambulatory Visit
Admission: RE | Admit: 2016-01-05 | Discharge: 2016-01-05 | Disposition: A | Discharge status: Home / Self Care | Payer: Medicare HMO | Source: Ambulatory Visit | Attending: Oncology | Admitting: Oncology

## 2016-01-05 ENCOUNTER — Encounter: Payer: Self-pay | Admitting: Internal Medicine

## 2016-01-05 ENCOUNTER — Ambulatory Visit (INDEPENDENT_AMBULATORY_CARE_PROVIDER_SITE_OTHER): Payer: Medicare HMO | Admitting: Internal Medicine

## 2016-01-05 VITALS — BP 136/72 | HR 72 | Ht 70.5 in | Wt 297.0 lb

## 2016-01-05 DIAGNOSIS — J439 Emphysema, unspecified: Secondary | ICD-10-CM | POA: Diagnosis not present

## 2016-01-05 DIAGNOSIS — J984 Other disorders of lung: Secondary | ICD-10-CM

## 2016-01-05 DIAGNOSIS — K760 Fatty (change of) liver, not elsewhere classified: Secondary | ICD-10-CM | POA: Insufficient documentation

## 2016-01-05 DIAGNOSIS — I700 Atherosclerosis of aorta: Secondary | ICD-10-CM | POA: Insufficient documentation

## 2016-01-05 DIAGNOSIS — Z8709 Personal history of other diseases of the respiratory system: Secondary | ICD-10-CM | POA: Diagnosis not present

## 2016-01-05 DIAGNOSIS — K8020 Calculus of gallbladder without cholecystitis without obstruction: Secondary | ICD-10-CM | POA: Insufficient documentation

## 2016-01-05 DIAGNOSIS — R590 Localized enlarged lymph nodes: Secondary | ICD-10-CM | POA: Diagnosis not present

## 2016-01-05 DIAGNOSIS — R918 Other nonspecific abnormal finding of lung field: Secondary | ICD-10-CM | POA: Diagnosis not present

## 2016-01-05 DIAGNOSIS — I2510 Atherosclerotic heart disease of native coronary artery without angina pectoris: Secondary | ICD-10-CM | POA: Insufficient documentation

## 2016-01-05 NOTE — Progress Notes (Signed)
[facility] Consultation      Date: 01/05/2016,   MRN# [id number] Shawn Lindsey Sep 29, 1950 Code Status:  Hosp day:'@LENGTHOFSTAYDAYS'$ @ Referring MD: '@ATDPROV'$ @     PCP:      AdmissionWeight: 297 lb (134.7 kg)                 CurrentWeight: 297 lb (134.7 kg) Shawn Lindsey is a 65 y.o. old male seen in consultation for RUL nodule and opacity with COPD at the request of Dr. Alyssa Grove.     CHIEF COMPLAINT:   I have abnormal CT scans   HISTORY OF PRESENT ILLNESS   65 yo white male seen today for h/o RUL nodular opacity previos opacity seen approx 1 year ago on CT chest Repeat CT chest on 07/02/2015 RUL cavitary lesions measures 5.0 x 2.2 x 3.6 cm.  Previously 5.4 x 2.5 x 3.5 cm.  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 patient sees Dr. Grayland Ormond for cancer care  Patient has had no significant weight loss, denies fevers, chills night sweats Patient has COPD and seems to be doing Ok on incruse Patient has no active wheezing today, denies CP,SOB today.  PFT 12/2015   ratio 72%, FEv1 90% DLCo 57% 6MWT WNL     Current Medication:  Current Outpatient Prescriptions:  .  amLODipine (NORVASC) 10 MG tablet, Take 10 mg by mouth daily., Disp: , Rfl:  .  aspirin 81 MG tablet, Take 81 mg by mouth daily., Disp: , Rfl:  .  atorvastatin (LIPITOR) 80 MG tablet, Take 80 mg by mouth daily. In the morning, Disp: , Rfl:  .  Cholecalciferol 2000 UNITS CAPS, Take by mouth daily., Disp: , Rfl:  .  FLUoxetine (PROZAC) 10 MG capsule, Take 10 mg by mouth daily., Disp: , Rfl:  .  gabapentin (NEURONTIN) 100 MG capsule, Take 400 mg by mouth daily. In the evening, Disp: , Rfl:    .  ibuprofen (ADVIL,MOTRIN) 200 MG tablet, Take 400 mg by mouth daily. In the morning, Disp: , Rfl:  .  levETIRAcetam (KEPPRA) 500 MG tablet, Take 500 mg by mouth 2 (two) times daily., Disp: , Rfl:  .  levothyroxine (SYNTHROID, LEVOTHROID) 125 MCG tablet, Take 125 mcg by mouth daily., Disp: , Rfl:  .  losartan (COZAAR) 100 MG tablet, Take 100 mg by mouth at bedtime., Disp: , Rfl:  .  magnesium oxide (MAG-OX) 400 MG tablet, Take 400 mg by mouth 2 (two) times daily., Disp: , Rfl:  .  Omega-3 Fatty Acids (FISH OIL) 1000 MG CAPS, Take by mouth daily., Disp: , Rfl:  .  omeprazole (PRILOSEC) 20 MG capsule, Take 20 mg by mouth daily., Disp: , Rfl:  .  prochlorperazine (COMPAZINE) 10 MG tablet, Take 10 mg by mouth every 6 (six) hours as needed for nausea or vomiting., Disp: , Rfl:  .  umeclidinium bromide (INCRUSE ELLIPTA) 62.5 MCG/INH AEPB, Inhale 1 puff into the lungs daily., Disp: 30 each, Rfl: 5 .  VENTOLIN HFA 108 (90 Base) MCG/ACT inhaler, , Disp: , Rfl: 1    ALLERGIES   No known allergies     REVIEW OF SYSTEMS   Review of Systems  Constitutional: Negative for chills, fever and weight loss.  HENT: Negative.  Negative for congestion.   Respiratory: Negative for cough, hemoptysis, sputum production, shortness of breath and wheezing.   Cardiovascular: Negative for chest pain, palpitations and orthopnea.  Gastrointestinal: Negative for heartburn, nausea and vomiting.  Skin: Negative for rash.  Psychiatric/Behavioral: The patient is not nervous/anxious.   All other systems reviewed and are negative.   BP 136/72 (BP Location: Left Wrist, Cuff Size: Normal)   Pulse 72   Ht 5' 10.5" (1.791 m)   Wt 297 lb (134.7 kg)   SpO2 93%   BMI 42.01 kg/m      PHYSICAL EXAM  Physical Exam  Constitutional: He is oriented to person, place, and time. He appears well-developed and well-nourished. No distress.  Eyes: EOM are normal. Pupils are equal, round, and reactive to light.  Neck: Neck  supple.  Cardiovascular: Normal rate, regular rhythm and normal heart sounds.   No murmur heard. Pulmonary/Chest: Effort normal and breath sounds normal. No stridor. No respiratory distress. He has no wheezes.  Musculoskeletal: Normal range of motion. He exhibits no edema.  Neurological: He is alert and oriented to person, place, and time.  Skin: Skin is warm. He is not diaphoretic.  Psychiatric: He has a normal mood and affect.            IMAGING    Ct Chest Wo Contrast  Result Date: 01/05/2016 CLINICAL DATA:  History of left tonsil cancer resected in 2015. Current smoker. Follow-up pulmonary nodules. EXAM: CT CHEST WITHOUT CONTRAST TECHNIQUE: Multidetector CT imaging of the chest was performed following the standard protocol without IV contrast. COMPARISON:  09/25/2015 chest CT. FINDINGS: Mediastinum/Nodes: Normal heart size. No significant pericardial fluid/thickening. Left main, left anterior descending and left circumflex coronary atherosclerosis. Atherosclerotic nonaneurysmal thoracic aorta. Normal caliber pulmonary arteries. No discrete thyroid nodules. Unremarkable esophagus. No axillary adenopathy. Stable mildly enlarged 1.1 cm right lower paratracheal node (series 2/image 60). No additional pathologically enlarged mediastinal or gross hilar nodes on this noncontrast study. Lungs/Pleura: No pneumothorax. No pleural effusion. Moderate centrilobular and paraseptal emphysema with mild diffuse bronchial wall thickening. There is a highly irregular cavitary lung mass in the posterior right upper lobe measuring 4.8 x 4.1 cm (series 5/ image 123), previously 4.6 x 3.8 cm on 09/25/2015 using similar measurement technique, mildly increased in size, with increased thickening of the walls of the cavitary portions of the mass. Several similar-appearing irregular adjacent right upper lobe pulmonary nodule as of increased in size. For example a 1.8 x 1.2 cm subpleural right upper lobe irregular nodule  (series 3/image 42), previously 1.5 x 1.0 cm. A cavitary 1.2 x 1.0 cm basilar right upper pulmonary nodule (series 3/image 63) is mildly increased from 1.0 x 0.8 cm. No additional significant pulmonary nodules. No new sites of consolidative airspace disease. Upper abdomen: Diffuse hepatic steatosis.  Cholelithiasis. Musculoskeletal: No aggressive appearing focal osseous lesions. Moderate thoracic spondylosis. IMPRESSION: 1. Continued growth of irregular cavitary posterior right upper lobe lung mass. Continued growth of adjacent irregular right upper lobe pulmonary nodules, one of which is cavitary. Findings are most worrisome for malignancy (primary bronchogenic carcinoma versus metastatic disease), with progressive cavitary infection also on the differential. Tissue sampling is warranted. 2. Stable nonspecific mild right paratracheal lymphadenopathy. 3. Moderate emphysema with mild diffuse bronchial wall thickening, suggesting COPD. 4. Additional findings include aortic atherosclerosis, left main and two-vessel coronary atherosclerosis, diffuse hepatic steatosis and cholelithiasis. Electronically Signed   By: Janina Mayo.D.  On: 01/05/2016 11:01    Images reviewed 01/05/2016     ASSESSMENT/PLAN   65 yo white male with COPD Stage A with persistent RUL nodular cavitary opacity that seems to have increased in size Will need to discuss his case and review findings in Thoracic Oncology Conference on Thursday afternoon and will contact patient regarging plan of care and management, patient also to seen Dr. Grayland Ormond for assessment  1.COPD --continue incruse -albuterol as needed -smoking cessation strongly advised  2. RUL cavitary lesion -will discuss further plan of action with Thoracic Oncology Team   Will need to discuss and review case with Oncologist, [place], CT surgeon and Radiologist  Will plan for ENB or PET scan.  The Patient requires high complexity decision making for assessment and  support, frequent evaluation and titration of therapies, application of advanced monitoring technologies and extensive interpretation of multiple databases.  Patient/Family are satisfied with Plan of action and management. All questions answered  Corrin Parker, M.D.  [facility]  Medical Director [place] Director [facility] Cardio-Pulmonary Department

## 2016-01-05 NOTE — Patient Instructions (Signed)
Follow up with Dr. Marcos Eke Please call back when you decide to proceed with Procedure  Chronic Obstructive Pulmonary Disease Chronic obstructive pulmonary disease (COPD) is a common lung condition in which airflow from the lungs is limited. COPD is a general term that can be used to describe many different lung problems that limit airflow, including both chronic bronchitis and emphysema. If you have COPD, your lung function will probably never return to normal, but there are measures you can take to improve lung function and make yourself feel better. CAUSES   Smoking (common).  Exposure to secondhand smoke.  Genetic problems.  Chronic inflammatory lung diseases or recurrent infections. SYMPTOMS  Shortness of breath, especially with physical activity.  Deep, persistent (chronic) cough with a large amount of thick mucus.  Wheezing.  Rapid breaths (tachypnea).  Gray or bluish discoloration (cyanosis) of the skin, especially in your fingers, toes, or lips.  Fatigue.  Weight loss.  Frequent infections or episodes when breathing symptoms become much worse (exacerbations).  Chest tightness. DIAGNOSIS Your health care provider will take a medical history and perform a physical examination to diagnose COPD. Additional tests for COPD may include:  Lung (pulmonary) function tests.  Chest X-ray.  CT scan.  Blood tests. TREATMENT  Treatment for COPD may include:  Inhaler and nebulizer medicines. These help manage the symptoms of COPD and make your breathing more comfortable.  Supplemental oxygen. Supplemental oxygen is only helpful if you have a low oxygen level in your blood.  Exercise and physical activity. These are beneficial for nearly all people with COPD.  Lung surgery or transplant.  Nutrition therapy to gain weight, if you are underweight.  Pulmonary rehabilitation. This may involve working with a team of health care providers and specialists, such as  respiratory, occupational, and physical therapists. HOME CARE INSTRUCTIONS  Take all medicines (inhaled or pills) as directed by your health care provider.  Avoid over-the-counter medicines or cough syrups that dry up your airway (such as antihistamines) and slow down the elimination of secretions unless instructed otherwise by your health care provider.  If you are a smoker, the most important thing that you can do is stop smoking. Continuing to smoke will cause further lung damage and breathing trouble. Ask your health care provider for help with quitting smoking. He or she can direct you to community resources or hospitals that provide support.  Avoid exposure to irritants such as smoke, chemicals, and fumes that aggravate your breathing.  Use oxygen therapy and pulmonary rehabilitation if directed by your health care provider. If you require home oxygen therapy, ask your health care provider whether you should purchase a pulse oximeter to measure your oxygen level at home.  Avoid contact with individuals who have a contagious illness.  Avoid extreme temperature and humidity changes.  Eat healthy foods. Eating smaller, more frequent meals and resting before meals may help you maintain your strength.  Stay active, but balance activity with periods of rest. Exercise and physical activity will help you maintain your ability to do things you want to do.  Preventing infection and hospitalization is very important when you have COPD. Make sure to receive all the vaccines your health care provider recommends, especially the pneumococcal and influenza vaccines. Ask your health care provider whether you need a pneumonia vaccine.  Learn and use relaxation techniques to manage stress.  Learn and use controlled breathing techniques as directed by your health care provider. Controlled breathing techniques include:  Pursed lip breathing. Start by breathing  in (inhaling) through your nose for 1  second. Then, purse your lips as if you were going to whistle and breathe out (exhale) through the pursed lips for 2 seconds.  Diaphragmatic breathing. Start by putting one hand on your abdomen just above your waist. Inhale slowly through your nose. The hand on your abdomen should move out. Then purse your lips and exhale slowly. You should be able to feel the hand on your abdomen moving in as you exhale.  Learn and use controlled coughing to clear mucus from your lungs. Controlled coughing is a series of short, progressive coughs. The steps of controlled coughing are: 1. Lean your head slightly forward. 2. Breathe in deeply using diaphragmatic breathing. 3. Try to hold your breath for 3 seconds. 4. Keep your mouth slightly open while coughing twice. 5. Spit any mucus out into a tissue. 6. Rest and repeat the steps once or twice as needed. SEEK MEDICAL CARE IF:  You are coughing up more mucus than usual.  There is a change in the color or thickness of your mucus.  Your breathing is more labored than usual.  Your breathing is faster than usual. SEEK IMMEDIATE MEDICAL CARE IF:  You have shortness of breath while you are resting.  You have shortness of breath that prevents you from:  Being able to talk.  Performing your usual physical activities.  You have chest pain lasting longer than 5 minutes.  Your skin color is more cyanotic than usual.  You measure low oxygen saturations for longer than 5 minutes with a pulse oximeter. MAKE SURE YOU:  Understand these instructions.  Will watch your condition.  Will get help right away if you are not doing well or get worse.   This information is not intended to replace advice given to you by your health care provider. Make sure you discuss any questions you have with your health care provider.   Document Released: 01/26/2005 Document Revised: 05/09/2014 Document Reviewed: 12/13/2012 Elsevier Interactive Patient Education [employer]

## 2016-01-06 NOTE — Progress Notes (Signed)
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  INTERVAL HISTORY: Patient returns to clinic today for routine 3 month evaluation and discussion of his imaging results.  He continues to feel well and is asymptomatic. He denies any fevers or recent illnesses. He has no neurologic complaints. He denies any chest pain, cough, hemoptysis, or shortness of breath. He denies any nausea, vomiting, constipation, or diarrhea. He has no urinary complaints. Patient offers no specific complaints today.  REVIEW OF SYSTEMS:   Review of Systems  Constitutional: Negative.   HENT: Negative.        Patient denies dysphagia.  Respiratory: Negative.  Negative for cough, hemoptysis and shortness of breath.   Cardiovascular: Negative.  Negative for chest pain.  Gastrointestinal: Negative.  Negative for abdominal pain.  Genitourinary: Negative.   Musculoskeletal: Negative.   Neurological: Negative.   Psychiatric/Behavioral: Negative.     As per HPI. Otherwise, a complete review of systems is negative.  PAST MEDICAL HISTORY: Past Medical History:  Diagnosis Date  . Cancer (HCC)    tonsil  . COPD (chronic obstructive pulmonary disease) ([place])   . Diabetes mellitus without complication ([place])   . Hypertension     PAST SURGICAL HISTORY: Past Surgical History:  Procedure Laterality Date  . TONSILLECTOMY    . TRACHEOSTOMY      FAMILY HISTORY: Reviewed and unchanged.  No reported history of malignancy or chronic disease.     ADVANCED DIRECTIVES:    HEALTH MAINTENANCE: Social History  Substance Use Topics  . Smoking status: Current Every Day Smoker    Packs/day: 0.50    Years: 40.00  . Smokeless tobacco: Not on file  . Alcohol  use No     Colonoscopy:  PAP:  Bone density:  Lipid panel:  Allergies  Allergen Reactions  . No Known Allergies     Current Outpatient Prescriptions  Medication Sig Dispense Refill  . amLODipine (NORVASC) 10 MG tablet Take 10 mg by mouth daily.    Marland Kitchen aspirin 81 MG tablet Take 81 mg by mouth daily.    Marland Kitchen atorvastatin (LIPITOR) 80 MG tablet Take 80 mg by mouth daily. In the morning    . Cholecalciferol 2000 UNITS CAPS Take by mouth daily.    Marland Kitchen FLUoxetine (PROZAC) 10 MG capsule Take 10 mg by mouth daily.    Marland Kitchen gabapentin (NEURONTIN) 100 MG capsule Take 400 mg by mouth daily. In the evening    . ibuprofen (ADVIL,MOTRIN) 200 MG tablet Take 400 mg by mouth daily. In the morning    . levETIRAcetam (KEPPRA) 500 MG tablet Take 500 mg by mouth 2 (two) times daily.    Marland Kitchen levothyroxine (SYNTHROID, LEVOTHROID) 125 MCG tablet Take 125 mcg by mouth daily.    Marland Kitchen losartan (COZAAR) 100 MG tablet Take 100 mg by mouth at bedtime.    . magnesium oxide (MAG-OX) 400 MG tablet Take 400 mg by mouth 2 (two) times daily.    . Omega-3 Fatty Acids (FISH OIL) 1000 MG CAPS Take by mouth daily.    Marland Kitchen omeprazole (PRILOSEC) 20 MG capsule Take 20 mg by mouth daily.    . prochlorperazine (COMPAZINE) 10 MG tablet Take 10 mg by mouth every 6 (six) hours as needed for nausea or vomiting.    Marland Kitchen  umeclidinium bromide (INCRUSE ELLIPTA) 62.5 MCG/INH AEPB Inhale 1 puff into the lungs daily. 30 each 5  . VENTOLIN HFA 108 (90 Base) MCG/ACT inhaler   1   No current facility-administered medications for this visit.     OBJECTIVE: Vitals:   01/07/16 1410  BP: 130/80  Pulse: 74  Resp: 18  Temp: 97.7 F (36.5 C)     Body mass index is 42.02 kg/m.    ECOG FS:0 - Asymptomatic  General: Well-developed, well-nourished, no acute distress. Eyes: Anicteric sclera. HEENT: Normocephalic, moist mucous membranes, clear oropharnyx. No palpable lymphadenopathy, thyroid midline without nodules. Lungs: Clear to auscultation  bilaterally. Heart: Regular rate and rhythm. No rubs, murmurs, or gallops. Abdomen: Soft, nontender, nondistended. No organomegaly noted, normoactive bowel sounds. Musculoskeletal: No edema, cyanosis, or clubbing. Neuro: Alert, answering all questions appropriately. Cranial nerves grossly intact. Skin: No rashes or petechiae noted. Psych: Normal affect.   LAB RESULTS:  Lab Results  Component Value Date   NA 137 10/05/2015   K 3.9 10/05/2015   CL 102 10/05/2015   CO2 27 10/05/2015   GLUCOSE 115 (H) 10/05/2015   BUN 23 (H) 10/05/2015   CREATININE 1.42 (H) 10/05/2015   CALCIUM 9.5 10/05/2015   PROT 7.4 06/18/2015   ALBUMIN 4.3 06/18/2015   AST 19 06/18/2015   ALT 21 06/18/2015   ALKPHOS 78 06/18/2015   BILITOT 0.8 06/18/2015   GFRNONAA 50 (L) 10/05/2015   GFRAA 58 (L) 10/05/2015    Lab Results  Component Value Date   WBC 10.0 10/05/2015   NEUTROABS 7.8 (H) 10/05/2015   HGB 16.8 10/05/2015   HCT 48.0 10/05/2015   MCV 95.1 10/05/2015   PLT 171 10/05/2015     STUDIES: Ct Chest Wo Contrast  Result Date: 01/05/2016 CLINICAL DATA:  History of left tonsil cancer resected in 2015. Current smoker. Follow-up pulmonary nodules. EXAM: CT CHEST WITHOUT CONTRAST TECHNIQUE: Multidetector CT imaging of the chest was performed following the standard protocol without IV contrast. COMPARISON:  09/25/2015 chest CT. FINDINGS: Mediastinum/Nodes: Normal heart size. No significant pericardial fluid/thickening. Left main, left anterior descending and left circumflex coronary atherosclerosis. Atherosclerotic nonaneurysmal thoracic aorta. Normal caliber pulmonary arteries. No discrete thyroid nodules. Unremarkable esophagus. No axillary adenopathy. Stable mildly enlarged 1.1 cm right lower paratracheal node (series 2/image 60). No additional pathologically enlarged mediastinal or gross hilar nodes on this noncontrast study. Lungs/Pleura: No pneumothorax. No pleural effusion. Moderate centrilobular and  paraseptal emphysema with mild diffuse bronchial wall thickening. There is a highly irregular cavitary lung mass in the posterior right upper lobe measuring 4.8 x 4.1 cm (series 5/ image 123), previously 4.6 x 3.8 cm on 09/25/2015 using similar measurement technique, mildly increased in size, with increased thickening of the walls of the cavitary portions of the mass. Several similar-appearing irregular adjacent right upper lobe pulmonary nodule as of increased in size. For example a 1.8 x 1.2 cm subpleural right upper lobe irregular nodule (series 3/image 42), previously 1.5 x 1.0 cm. A cavitary 1.2 x 1.0 cm basilar right upper pulmonary nodule (series 3/image 63) is mildly increased from 1.0 x 0.8 cm. No additional significant pulmonary nodules. No new sites of consolidative airspace disease. Upper abdomen: Diffuse hepatic steatosis.  Cholelithiasis. Musculoskeletal: No aggressive appearing focal osseous lesions. Moderate thoracic spondylosis. IMPRESSION: 1. Continued growth of irregular cavitary posterior right upper lobe lung mass. Continued growth of adjacent irregular right upper lobe pulmonary nodules, one of which is cavitary. Findings are most worrisome for malignancy (primary bronchogenic carcinoma versus  metastatic disease), with progressive cavitary infection also on the differential. Tissue sampling is warranted. 2. Stable nonspecific mild right paratracheal lymphadenopathy. 3. Moderate emphysema with mild diffuse bronchial wall thickening, suggesting COPD. 4. Additional findings include aortic atherosclerosis, left main and two-vessel coronary atherosclerosis, diffuse hepatic steatosis and cholelithiasis. Electronically Signed   By: Ilona Sorrel M.D.   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 No intervention is needed at this time.  Patient states he no longer follows up with ENT.   2. Dysphasia: Resolved. Continue Carafate as needed. 3. Left pulmonary nodule: CT scan results as above with slightly worsening nodules. Patient was discussed at length with pulmonology and it was agreed to repeat PET scan and then either EBUS or navigational bronchoscopy for repeat biopsy. Return to clinic approximately one week after the biopsy to discuss the results. 4.  Thrombocytopenia: Resolved. 5.  Elevated creatinine: Mild. Monitor.  Approximately 30 minutes was spent in discussion of which greater than 50% was consultation.  Patient expressed understanding and was in agreement with this plan. He also understands that He can call clinic at any time with any questions, concerns, or complaints.   Cancer of tonsil   Staging form: Pharynx - Oropharynx, AJCC 7th Edition     Clinical stage from 11/03/2014: Stage II (T2, N0, M0) - Signed by Lloyd Huger, MD on 11/03/2014   Lloyd Huger, MD   01/10/2016 9:50 PM

## 2016-01-07 ENCOUNTER — Telehealth: Payer: Self-pay | Admitting: Internal Medicine

## 2016-01-07 ENCOUNTER — Inpatient Hospital Stay: Payer: Medicare HMO | Attending: Oncology | Admitting: Oncology

## 2016-01-07 VITALS — BP 130/80 | HR 74 | Temp 97.7°F | Resp 18 | Wt 297.1 lb

## 2016-01-07 DIAGNOSIS — C099 Malignant neoplasm of tonsil, unspecified: Secondary | ICD-10-CM | POA: Insufficient documentation

## 2016-01-07 DIAGNOSIS — R918 Other nonspecific abnormal finding of lung field: Secondary | ICD-10-CM

## 2016-01-07 DIAGNOSIS — I10 Essential (primary) hypertension: Secondary | ICD-10-CM | POA: Diagnosis not present

## 2016-01-07 DIAGNOSIS — R911 Solitary pulmonary nodule: Secondary | ICD-10-CM | POA: Insufficient documentation

## 2016-01-07 DIAGNOSIS — J449 Chronic obstructive pulmonary disease, unspecified: Secondary | ICD-10-CM | POA: Insufficient documentation

## 2016-01-07 DIAGNOSIS — Z79899 Other long term (current) drug therapy: Secondary | ICD-10-CM | POA: Diagnosis not present

## 2016-01-07 DIAGNOSIS — E119 Type 2 diabetes mellitus without complications: Secondary | ICD-10-CM | POA: Insufficient documentation

## 2016-01-07 DIAGNOSIS — R7989 Other specified abnormal findings of blood chemistry: Secondary | ICD-10-CM

## 2016-01-07 NOTE — Telephone Encounter (Signed)
[facility] placed a new referral for patient but he was seen on 9/5 .  Referral says he may need a bronch.  Can you look at the chart an let me know if there is anything else I can do.

## 2016-01-07 NOTE — Telephone Encounter (Signed)
Spoke with DK and we will schedule pt for bronch. Will call pt once scheduled. Will hold in basket till done.

## 2016-01-07 NOTE — Progress Notes (Signed)
States is feeling well. Offers no complaints. 

## 2016-01-08 NOTE — Telephone Encounter (Signed)
Working with DK on scheduling date. With schedule and call pt. Nothing further needed.

## 2016-01-13 ENCOUNTER — Ambulatory Visit
Admission: RE | Admit: 2016-01-13 | Discharge: 2016-01-13 | Disposition: A | Discharge status: Home / Self Care | Payer: Medicare HMO | Source: Ambulatory Visit | Attending: Oncology | Admitting: Oncology

## 2016-01-13 DIAGNOSIS — R918 Other nonspecific abnormal finding of lung field: Secondary | ICD-10-CM | POA: Diagnosis not present

## 2016-01-13 DIAGNOSIS — R933 Abnormal findings on diagnostic imaging of other parts of digestive tract: Secondary | ICD-10-CM | POA: Diagnosis not present

## 2016-01-13 DIAGNOSIS — E079 Disorder of thyroid, unspecified: Secondary | ICD-10-CM | POA: Insufficient documentation

## 2016-01-13 DIAGNOSIS — J439 Emphysema, unspecified: Secondary | ICD-10-CM | POA: Diagnosis not present

## 2016-01-13 DIAGNOSIS — K449 Diaphragmatic hernia without obstruction or gangrene: Secondary | ICD-10-CM | POA: Insufficient documentation

## 2016-01-13 DIAGNOSIS — K8020 Calculus of gallbladder without cholecystitis without obstruction: Secondary | ICD-10-CM | POA: Insufficient documentation

## 2016-01-13 DIAGNOSIS — I700 Atherosclerosis of aorta: Secondary | ICD-10-CM | POA: Insufficient documentation

## 2016-01-13 DIAGNOSIS — I2510 Atherosclerotic heart disease of native coronary artery without angina pectoris: Secondary | ICD-10-CM | POA: Diagnosis not present

## 2016-01-13 LAB — GLUCOSE, CAPILLARY: Glucose-Capillary: 102 mg/dL — ABNORMAL HIGH (ref 65–99)

## 2016-01-13 MED ORDER — FLUDEOXYGLUCOSE F - 18 (FDG) INJECTION
12.0000 | Freq: Once | INTRAVENOUS | Status: AC | PRN
  Administered 2016-01-13: 12.51 via INTRAVENOUS

## 2016-01-20 NOTE — Telephone Encounter (Signed)
Spoke with pt previously and sent message to DK informing pt doesn't want to proceed with bronch at this time. Will call when he does.

## 2016-01-22 ENCOUNTER — Telehealth: Payer: Self-pay | Admitting: Pulmonary Disease

## 2016-01-22 NOTE — Telephone Encounter (Signed)
Pt called back inquiring about how and when we could get the bronch scheduled. Pt's feels he may go ahead and schedule bronch. States he will call me back next week and let me know. Nothing further needed at this time.

## 2016-01-22 NOTE — Telephone Encounter (Signed)
Pt calling asking when he is to be off his medication so we can do the biopsy on his lungs. Would like a call back on Monday He said it was not urgent.  Please advise.

## 2016-01-25 ENCOUNTER — Ambulatory Visit: Payer: Medicare HMO | Admitting: Internal Medicine

## 2016-02-09 ENCOUNTER — Telehealth: Payer: Self-pay | Admitting: *Deleted

## 2016-02-09 NOTE — Telephone Encounter (Signed)
Pt has called back and says he is ready to proceed with his bronch. Please advise if it will still be an ENB and I will get pt scheduled. Do you want to see him in office before procedure also? Once I hear back from you I will get scheduled. Informed pt I would get it scheduled and then I would call him back.

## 2016-02-09 NOTE — Telephone Encounter (Signed)
Received VM from pt asking me to give him a call back. LMOM for pt to return call.

## 2016-02-16 NOTE — Telephone Encounter (Signed)
Set up for ENB please

## 2016-02-17 NOTE — Telephone Encounter (Signed)
Will get ENB scheduled and inform pt once scheduled. Nothing further needed.

## 2016-03-01 ENCOUNTER — Encounter
Admission: RE | Admit: 2016-03-01 | Discharge: 2016-03-01 | Disposition: A | Discharge status: Home / Self Care | Payer: Medicare HMO | Source: Ambulatory Visit | Attending: Internal Medicine | Admitting: Internal Medicine

## 2016-03-01 DIAGNOSIS — E119 Type 2 diabetes mellitus without complications: Secondary | ICD-10-CM | POA: Insufficient documentation

## 2016-03-01 DIAGNOSIS — I10 Essential (primary) hypertension: Secondary | ICD-10-CM | POA: Insufficient documentation

## 2016-03-01 DIAGNOSIS — Z01818 Encounter for other preprocedural examination: Secondary | ICD-10-CM | POA: Insufficient documentation

## 2016-03-01 HISTORY — DX: Unspecified convulsions: R56.9

## 2016-03-01 HISTORY — DX: Pure hypercholesterolemia, unspecified: E78.00

## 2016-03-01 HISTORY — DX: Hypothyroidism, unspecified: E03.9

## 2016-03-01 HISTORY — DX: Gastro-esophageal reflux disease without esophagitis: K21.9

## 2016-03-01 HISTORY — DX: Cerebral infarction, unspecified: I63.9

## 2016-03-01 HISTORY — DX: Anxiety disorder, unspecified: F41.9

## 2016-03-01 LAB — CBC
HCT: 45.8 % (ref 40.0–52.0)
Hemoglobin: 15.8 g/dL (ref 13.0–18.0)
MCH: 33.3 pg (ref 26.0–34.0)
MCHC: 34.5 g/dL (ref 32.0–36.0)
MCV: 96.7 fL (ref 80.0–100.0)
Platelets: 162 10*3/uL (ref 150–440)
RBC: 4.73 MIL/uL (ref 4.40–5.90)
RDW: 13.7 % (ref 11.5–14.5)
WBC: 9.3 10*3/uL (ref 3.8–10.6)

## 2016-03-01 LAB — DIFFERENTIAL
Basophils Absolute: 0.1 10*3/uL (ref 0–0.1)
Basophils Relative: 1 %
Eosinophils Absolute: 0.2 10*3/uL (ref 0–0.7)
Eosinophils Relative: 2 %
Lymphocytes Relative: 12 %
Lymphs Abs: 1.1 10*3/uL (ref 1.0–3.6)
Monocytes Absolute: 0.8 10*3/uL (ref 0.2–1.0)
Monocytes Relative: 8 %
Neutro Abs: 7.1 10*3/uL — ABNORMAL HIGH (ref 1.4–6.5)
Neutrophils Relative %: 77 %

## 2016-03-01 NOTE — Patient Instructions (Signed)
  Your procedure is scheduled on: Wednesday Nov. 8, 2017. Report to Same Day Surgery. To find out your arrival time please call [phone] between 1PM - 3PM on Tuesday Nov. 7, 2017.  Remember: Instructions that are not followed completely may result in serious medical risk, up to and including death, or upon the discretion of your surgeon and anesthesiologist your surgery may need to be rescheduled.    _x___ 1. Do not eat food or drink liquids after midnight. No gum chewing or hard candies.     ____ 2. No Alcohol for 24 hours before or after surgery.   ____ 3. Bring all medications with you on the day of surgery if instructed.    __x__ 4. Notify your doctor if there is any change in your medical condition     (cold, fever, infections).    __x___ 5. No smoking 24 hours prior to surgery.     Do not wear jewelry, make-up, hairpins, clips or nail polish.  Do not wear lotions, powders, or perfumes.   Do not shave 48 hours prior to surgery. Men may shave face and neck.  Do not bring valuables to the hospital.    [facility] is not responsible for any belongings or valuables.               Contacts, dentures or bridgework may not be worn into surgery.  Leave your suitcase in the car. After surgery it may be brought to your room.  For patients admitted to the hospital, discharge time is determined by your treatment team.   Patients discharged the day of surgery will not be allowed to drive home.    Please read over the following fact sheets that you were given:   [facility] Preparing for Surgery  _x___ Take these medicines the morning of surgery with A SIP OF WATER:    1. amLODipine (NORVASC)  2. atorvastatin (LIPITOR)  3. FLUoxetine (PROZAC)  4. levETIRAcetam (KEPPRA)  5. levothyroxine (SYNTHROID, LEVOTHROID)  6. omeprazole (PRILOSEC)   ____ Fleet Enema (as directed)   ____ Use CHG Soap as directed on instruction sheet  __x__ Use ventolin inhalers on the day of surgery and  bring to hospital day of surgery  ____ Stop metformin 2 days prior to surgery    ____ Take 1/2 of usual insulin dose the night before surgery and none on the morning of surgery.   _x___ Stop aspirin as directed by Dr. Mortimer Fries 7 days prior to surgery.  __x__ Stop Anti-inflammatories such as Advil, Aleve, Ibuprofen, Motrin, Naproxen, Naprosyn, Goodies powders or aspirin products. OK to take Tylenol.   _x___ Stop supplements:Omega-3 Fatty Acids (FISH OIL) until after surgery.    ____ Bring C-Pap to the hospital.

## 2016-03-01 NOTE — Pre-Procedure Instructions (Signed)
Today's EKG compared to prior EKG:   SINUS RHYTHM WITH 1ST DEGREE AV BLOCK OTHERWISE NORMAL ECG WHEN COMPARED WITH ECG OF 15-Oct-2008 04:00, NONSPECIFIC T WAVE ABNORMALITY NO LONGER EVIDENT IN LATERAL LEADS Confirmed by DUPREEMD, CARLA ([id number]) on 09/24/2009 3:49:06 PM   No change from today's EKG.

## 2016-03-09 ENCOUNTER — Encounter
Admission: RE | Disposition: A | Discharge status: Home / Self Care | Payer: Self-pay | Source: Ambulatory Visit | Attending: Internal Medicine

## 2016-03-09 ENCOUNTER — Encounter: Payer: Self-pay | Admitting: *Deleted

## 2016-03-09 ENCOUNTER — Ambulatory Visit
Admission: RE | Admit: 2016-03-09 | Discharge: 2016-03-09 | Disposition: A | Discharge status: Home / Self Care | Payer: Medicare HMO | Source: Ambulatory Visit | Attending: Internal Medicine | Admitting: Internal Medicine

## 2016-03-09 ENCOUNTER — Ambulatory Visit: Payer: Medicare HMO | Admitting: Certified Registered Nurse Anesthetist

## 2016-03-09 ENCOUNTER — Ambulatory Visit: Payer: Medicare HMO

## 2016-03-09 DIAGNOSIS — J449 Chronic obstructive pulmonary disease, unspecified: Secondary | ICD-10-CM | POA: Insufficient documentation

## 2016-03-09 DIAGNOSIS — Z8520 Personal history of malignant neoplasm of unspecified respiratory organ: Secondary | ICD-10-CM | POA: Insufficient documentation

## 2016-03-09 DIAGNOSIS — I10 Essential (primary) hypertension: Secondary | ICD-10-CM | POA: Insufficient documentation

## 2016-03-09 DIAGNOSIS — R918 Other nonspecific abnormal finding of lung field: Secondary | ICD-10-CM | POA: Diagnosis not present

## 2016-03-09 DIAGNOSIS — Z79899 Other long term (current) drug therapy: Secondary | ICD-10-CM | POA: Diagnosis not present

## 2016-03-09 DIAGNOSIS — Z6841 Body Mass Index (BMI) 40.0 and over, adult: Secondary | ICD-10-CM | POA: Diagnosis not present

## 2016-03-09 DIAGNOSIS — K219 Gastro-esophageal reflux disease without esophagitis: Secondary | ICD-10-CM | POA: Insufficient documentation

## 2016-03-09 DIAGNOSIS — Z7982 Long term (current) use of aspirin: Secondary | ICD-10-CM | POA: Diagnosis not present

## 2016-03-09 DIAGNOSIS — F17210 Nicotine dependence, cigarettes, uncomplicated: Secondary | ICD-10-CM | POA: Diagnosis not present

## 2016-03-09 DIAGNOSIS — Z8673 Personal history of transient ischemic attack (TIA), and cerebral infarction without residual deficits: Secondary | ICD-10-CM | POA: Diagnosis not present

## 2016-03-09 DIAGNOSIS — E119 Type 2 diabetes mellitus without complications: Secondary | ICD-10-CM | POA: Insufficient documentation

## 2016-03-09 HISTORY — PX: ELECTROMAGNETIC NAVIGATION BROCHOSCOPY: SHX5369

## 2016-03-09 LAB — GLUCOSE, CAPILLARY
Glucose-Capillary: 116 mg/dL — ABNORMAL HIGH (ref 65–99)
Glucose-Capillary: 89 mg/dL (ref 65–99)

## 2016-03-09 SURGERY — ELECTROMAGNETIC NAVIGATION BRONCHOSCOPY
Anesthesia: General

## 2016-03-09 MED ORDER — DEXAMETHASONE SODIUM PHOSPHATE 10 MG/ML IJ SOLN
INTRAMUSCULAR | Status: DC | PRN
  Administered 2016-03-09: 4 mg via INTRAVENOUS

## 2016-03-09 MED ORDER — SODIUM CHLORIDE 0.9 % IV SOLN
INTRAVENOUS | Status: DC
  Administered 2016-03-09: 13:00:00 via INTRAVENOUS

## 2016-03-09 MED ORDER — ONDANSETRON HCL 4 MG/2ML IJ SOLN: 4.0000 mg | Freq: Once | INTRAMUSCULAR | Status: DC | PRN

## 2016-03-09 MED ORDER — ONDANSETRON HCL 4 MG/2ML IJ SOLN
INTRAMUSCULAR | Status: DC | PRN
  Administered 2016-03-09: 4 mg via INTRAVENOUS

## 2016-03-09 MED ORDER — ROCURONIUM BROMIDE 100 MG/10ML IV SOLN
INTRAVENOUS | Status: DC | PRN
  Administered 2016-03-09 (×2): 10 mg via INTRAVENOUS
  Administered 2016-03-09: 30 mg via INTRAVENOUS

## 2016-03-09 MED ORDER — LACTATED RINGERS IV SOLN
INTRAVENOUS | Status: DC | PRN
  Administered 2016-03-09: 13:00:00 via INTRAVENOUS

## 2016-03-09 MED ORDER — SUGAMMADEX SODIUM 200 MG/2ML IV SOLN
INTRAVENOUS | Status: DC | PRN
  Administered 2016-03-09: 300 mg via INTRAVENOUS

## 2016-03-09 MED ORDER — SUCCINYLCHOLINE CHLORIDE 20 MG/ML IJ SOLN
INTRAMUSCULAR | Status: DC | PRN
  Administered 2016-03-09: 100 mg via INTRAVENOUS

## 2016-03-09 MED ORDER — EPHEDRINE SULFATE 50 MG/ML IJ SOLN
INTRAMUSCULAR | Status: DC | PRN
  Administered 2016-03-09 (×2): 10 mg via INTRAVENOUS
  Administered 2016-03-09: 15 mg via INTRAVENOUS

## 2016-03-09 MED ORDER — PROPOFOL 10 MG/ML IV BOLUS
INTRAVENOUS | Status: DC | PRN
  Administered 2016-03-09: 200 mg via INTRAVENOUS

## 2016-03-09 MED ORDER — FENTANYL CITRATE (PF) 100 MCG/2ML IJ SOLN: 25.0000 ug | INTRAMUSCULAR | Status: DC | PRN

## 2016-03-09 MED ORDER — FENTANYL CITRATE (PF) 100 MCG/2ML IJ SOLN
INTRAMUSCULAR | Status: DC | PRN
  Administered 2016-03-09 (×2): 50 ug via INTRAVENOUS
  Administered 2016-03-09: 100 ug via INTRAVENOUS

## 2016-03-09 MED ORDER — MIDAZOLAM HCL 2 MG/2ML IJ SOLN
INTRAMUSCULAR | Status: DC | PRN
  Administered 2016-03-09: 2 mg via INTRAVENOUS

## 2016-03-09 NOTE — H&P (Signed)
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  INTERVAL HISTORY: Patient returns to clinic today for routine 3 month evaluation and discussion of his imaging results.  He continues to feel well and is asymptomatic. He denies any fevers or recent illnesses. He has no neurologic complaints. He denies any chest pain, cough, hemoptysis, or shortness of breath. He denies any nausea, vomiting, constipation, or diarrhea. He has no urinary complaints. Patient offers no specific complaints today.  REVIEW OF SYSTEMS:   Review of Systems  Constitutional: Negative.   HENT: Negative.        Patient denies dysphagia.  Respiratory: Negative.  Negative for cough, hemoptysis and shortness of breath.   Cardiovascular: Negative.  Negative for chest pain.  Gastrointestinal: Negative.  Negative for abdominal pain.  Genitourinary: Negative.   Musculoskeletal: Negative.   Neurological: Negative.   Psychiatric/Behavioral: Negative.     As per HPI. Otherwise, a complete review of systems is negative.  PAST MEDICAL HISTORY:     Past Medical History:  Diagnosis Date  . Cancer (HCC)    tonsil  . COPD (chronic obstructive pulmonary disease) ([place])   . Diabetes mellitus without complication ([place])   . Hypertension     PAST SURGICAL HISTORY: Past Surgical History:  Procedure Laterality Date  . TONSILLECTOMY    . TRACHEOSTOMY      FAMILY HISTORY: Reviewed and unchanged.  No reported history of malignancy or chronic disease.                           ADVANCED DIRECTIVES:    HEALTH MAINTENANCE:      Social History  Substance Use Topics  . Smoking status: Current Every Day Smoker    Packs/day: 0.50    Years: 40.00  . Smokeless tobacco: Not on file  . Alcohol use No                Colonoscopy:             PAP:             Bone density:             Lipid panel:      Allergies  Allergen Reactions  . No Known Allergies            Current Outpatient Prescriptions  Medication Sig Dispense Refill  . amLODipine (NORVASC) 10 MG tablet Take 10 mg by mouth daily.    Marland Kitchen aspirin 81 MG tablet Take 81 mg by mouth daily.    Marland Kitchen atorvastatin (LIPITOR) 80 MG tablet Take 80 mg by mouth daily. In the morning    . Cholecalciferol 2000 UNITS CAPS Take by mouth daily.    Marland Kitchen FLUoxetine (PROZAC) 10 MG capsule Take 10 mg by mouth daily.    Marland Kitchen gabapentin (NEURONTIN) 100 MG capsule Take 400 mg by mouth daily. In the evening    . ibuprofen (ADVIL,MOTRIN) 200 MG tablet Take 400 mg by mouth daily. In the morning    . levETIRAcetam (KEPPRA) 500 MG tablet Take 500 mg by mouth 2 (two) times daily.    Marland Kitchen levothyroxine (SYNTHROID, LEVOTHROID) 125 MCG tablet Take 125 mcg by mouth daily.    Marland Kitchen losartan (COZAAR) 100 MG tablet Take 100 mg by mouth at bedtime.    . magnesium oxide (MAG-OX) 400 MG tablet Take 400 mg by mouth 2 (two) times daily.    . Omega-3 Fatty Acids (  FISH OIL) 1000 MG CAPS Take by mouth daily.    Marland Kitchen omeprazole (PRILOSEC) 20 MG capsule Take 20 mg by mouth daily.    . prochlorperazine (COMPAZINE) 10 MG tablet Take 10 mg by mouth every 6 (six) hours as needed for nausea or vomiting.    . umeclidinium bromide (INCRUSE ELLIPTA) 62.5 MCG/INH AEPB Inhale 1 puff into the lungs daily. 30 each 5  . VENTOLIN HFA 108 (90 Base) MCG/ACT inhaler   1   No current facility-administered medications for this visit.     OBJECTIVE:    Vitals:   01/07/16 1410  BP: 130/80  Pulse: 74  Resp: 18  Temp: 97.7 F (36.5 C)     Body mass index is 42.02 kg/m.    ECOG FS:0 - Asymptomatic  General: Well-developed, well-nourished, no acute distress. Eyes: Anicteric sclera. HEENT: Normocephalic, moist mucous membranes, clear oropharnyx. No palpable lymphadenopathy, thyroid midline without nodules. Lungs: Clear to auscultation bilaterally. Heart: Regular rate and rhythm. No rubs, murmurs, or gallops. Abdomen: Soft,  nontender, nondistended. No organomegaly noted, normoactive bowel sounds. Musculoskeletal: No edema, cyanosis, or clubbing. Neuro: Alert, answering all questions appropriately. Cranial nerves grossly intact. Skin: No rashes or petechiae noted. Psych: Normal affect.   LAB RESULTS:  RecentLabs       Lab Results  Component Value Date   NA 137 10/05/2015   K 3.9 10/05/2015   CL 102 10/05/2015   CO2 27 10/05/2015   GLUCOSE 115 (H) 10/05/2015   BUN 23 (H) 10/05/2015   CREATININE 1.42 (H) 10/05/2015   CALCIUM 9.5 10/05/2015   PROT 7.4 06/18/2015   ALBUMIN 4.3 06/18/2015   AST 19 06/18/2015   ALT 21 06/18/2015   ALKPHOS 78 06/18/2015   BILITOT 0.8 06/18/2015   GFRNONAA 50 (L) 10/05/2015   GFRAA 58 (L) 10/05/2015      RecentLabs       Lab Results  Component Value Date   WBC 10.0 10/05/2015   NEUTROABS 7.8 (H) 10/05/2015   HGB 16.8 10/05/2015   HCT 48.0 10/05/2015   MCV 95.1 10/05/2015   PLT 171 10/05/2015       STUDIES:  ImagingResults  Ct Chest Wo Contrast  Result Date: 01/05/2016 CLINICAL DATA:  History of left tonsil cancer resected in 2015. Current smoker. Follow-up pulmonary nodules. EXAM: CT CHEST WITHOUT CONTRAST TECHNIQUE: Multidetector CT imaging of the chest was performed following the standard protocol without IV contrast. COMPARISON:  09/25/2015 chest CT. FINDINGS: Mediastinum/Nodes: Normal heart size. No significant pericardial fluid/thickening. Left main, left anterior descending and left circumflex coronary atherosclerosis. Atherosclerotic nonaneurysmal thoracic aorta. Normal caliber pulmonary arteries. No discrete thyroid nodules. Unremarkable esophagus. No axillary adenopathy. Stable mildly enlarged 1.1 cm right lower paratracheal node (series 2/image 60). No additional pathologically enlarged mediastinal or gross hilar nodes on this noncontrast study. Lungs/Pleura: No pneumothorax. No pleural effusion. Moderate centrilobular  and paraseptal emphysema with mild diffuse bronchial wall thickening. There is a highly irregular cavitary lung mass in the posterior right upper lobe measuring 4.8 x 4.1 cm (series 5/ image 123), previously 4.6 x 3.8 cm on 09/25/2015 using similar measurement technique, mildly increased in size, with increased thickening of the walls of the cavitary portions of the mass. Several similar-appearing irregular adjacent right upper lobe pulmonary nodule as of increased in size. For example a 1.8 x 1.2 cm subpleural right upper lobe irregular nodule (series 3/image 42), previously 1.5 x 1.0 cm. A cavitary 1.2 x 1.0 cm basilar right upper pulmonary nodule (series 3/image  63) is mildly increased from 1.0 x 0.8 cm. No additional significant pulmonary nodules. No new sites of consolidative airspace disease. Upper abdomen: Diffuse hepatic steatosis.  Cholelithiasis. Musculoskeletal: No aggressive appearing focal osseous lesions. Moderate thoracic spondylosis. IMPRESSION: 1. Continued growth of irregular cavitary posterior right upper lobe lung mass. Continued growth of adjacent irregular right upper lobe pulmonary nodules, one of which is cavitary. Findings are most worrisome for malignancy (primary bronchogenic carcinoma versus metastatic disease), with progressive cavitary infection also on the differential. Tissue sampling is warranted. 2. Stable nonspecific mild right paratracheal lymphadenopathy. 3. Moderate emphysema with mild diffuse bronchial wall thickening, suggesting COPD. 4. Additional findings include aortic atherosclerosis, left main and two-vessel coronary atherosclerosis, diffuse hepatic steatosis and cholelithiasis. Electronically Signed   By: Ilona Sorrel M.D.   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 I have also explained to avoid all types of NSAIDs to decrease chance of bleeding, and to avoid food and drinks the midnight prior to procedure.  The procedure consists of a video camera with a light source to be placed and inserted  into the lungs to  look for abnormal tissue and to obtain tissue samples by using needle and biopsy tools.  The patient/family understand the risks and benefits and have agreed to proceed with procedure.  Cancer of tonsil   Staging form: Pharynx - Oropharynx, AJCC 7th Edition     Clinical stage from 11/03/2014: Stage II (T2, N0, M0) - Signed by Lloyd Huger, MD on 11/03/2014   Patient/Family are satisfied with Plan of action and management. All questions answered  Corrin Parker, M.D.  [facility]  Medical Director [place] Director [facility] Cardio-Pulmonary Department

## 2016-03-09 NOTE — Anesthesia Preprocedure Evaluation (Addendum)
Anesthesia Evaluation  Patient identified by MRN, date of birth, ID band Patient awake    Reviewed: Allergy & Precautions, NPO status , Patient's Chart, lab work & pertinent test results  Airway Mallampati: II       Dental  (+) Edentulous Upper, Edentulous Lower   Pulmonary COPD, Current Smoker,  Previous tracheostomy    + decreased breath sounds      Cardiovascular hypertension, Pt. on medications  Rhythm:Regular Rate:Normal     Neuro/Psych Seizures -,  Anxiety CVA    GI/Hepatic Neg liver ROS, GERD  Medicated,  Endo/Other  diabetes, Type 2Hypothyroidism Morbid obesity  Renal/GU      Musculoskeletal   Abdominal (+) + obese,   Peds  Hematology   Anesthesia Other Findings   Reproductive/Obstetrics                            Anesthesia Physical Anesthesia Plan  ASA: III  Anesthesia Plan: General   Post-op Pain Management:    Induction:   Airway Management Planned: Oral ETT  Additional Equipment:   Intra-op Plan:   Post-operative Plan: Extubation in OR  Informed Consent: I have reviewed the patients History and Physical, chart, labs and discussed the procedure including the risks, benefits and alternatives for the proposed anesthesia with the patient or authorized representative who has indicated his/her understanding and acceptance.     Plan Discussed with: CRNA  Anesthesia Plan Comments:         Anesthesia Quick Evaluation

## 2016-03-09 NOTE — Anesthesia Postprocedure Evaluation (Signed)
Anesthesia Post Note  Patient: Shawn Lindsey  Procedure(s) Performed: Procedure(s) (LRB): ELECTROMAGNETIC NAVIGATION BRONCHOSCOPY (N/A)  Patient location during evaluation: PACU Level of consciousness: awake Pain management: pain level controlled Vital Signs Assessment: post-procedure vital signs reviewed and stable Respiratory status: spontaneous breathing Cardiovascular status: stable Anesthetic complications: no    Last Vitals:  Vitals:   03/09/16 1435 03/09/16 1450  BP: 100/79 112/67  Pulse: 61 (!) 59  Resp: 12 11  Temp:  36.2 C    Last Pain:  Vitals:   03/09/16 1206  TempSrc: Oral                 VAN STAVEREN,Ryden Wainer

## 2016-03-09 NOTE — Anesthesia Procedure Notes (Signed)
Procedure Name: Intubation Date/Time: 03/09/2016 1:27 PM Performed by: Darlyne Russian Pre-anesthesia Checklist: Patient identified, Emergency Drugs available, Suction available, Patient being monitored and Timeout performed Patient Re-evaluated:Patient Re-evaluated prior to inductionOxygen Delivery Method: Circle system utilized Preoxygenation: Pre-oxygenation with 100% oxygen Intubation Type: IV induction Ventilation: Mask ventilation without difficulty Laryngoscope Size: Mac and 4 Grade View: Grade II Tube type: Oral Tube size: 8.5 mm Number of attempts: 1 Airway Equipment and Method: Stylet Placement Confirmation: ETT inserted through vocal cords under direct vision,  positive ETCO2 and breath sounds checked- equal and bilateral Secured at: 24 cm Tube secured with: Tape

## 2016-03-09 NOTE — Op Note (Signed)
Electromagnetic Navigation Bronchoscopy: Indication: RUL mass  Preoperative Diagnosis:lung mass Consent: Verbal Risks and benefits explained in detail including risk of infection, bleeding, respiratory failure and death.   Hand washing performed prior to starting the procedure.   Type of Anesthesia: see Anesthesiology records .   Procedure Performed:  Virtual Bronchoscopy with Multi-planar Image analysis, 3-D reconstruction of coronal, sagittal and multi-planar images for the purposes of planning real-time bronchoscopy using the i[employer] Electromagnetic Navigation Bronchoscopy System (superDimension)..   Description of Procedure: After obtaining informed consent from the patient, the above sedative and anesthetic measures were carried out, flexible fiberoptic bronchoscope was inserted via an oral bite block. Posterior pharynx was clear. The 2 vocal cords were easily traversed after application of local anesthetic.  The virtual camera was then placed into the central portion of the trachea. The trachea itself was inspected.  The main carina, right and left midstem bronchus and all the segmental and subsegmental airways by virtual bronchoscopy were brieftly inspected.  The camera was directed to standard registration points at the following centers: main carina, right upper lobe bronchus, right lower lobe bronchus, right middle lobe bronchus, left upper lobe bronchus, and the left lower lobe bronchus. This data was transferred to the i-Log[employer] ENB system for real-time bronchoscopy.   I advance the Bronchoscope into the RUL posterior segment and used the navigational system to advance approx 1 cm from RUL mass.  I was able to visualize the tissues sampling under flouroscopy  Specimans Obtained:  Fine Needle Aspirations 21G times:1  Forceps Biopsy times:3  Triple Needle Brush:2    Fluoroscopy:  Fluoroscopy was utilized during the course of this procedure to assure that biopsies were taken in a  safe manner under fluoroscopic guidance with no spot films required.   Complications:none  Estimated Blood Loss: none  Monitoring:  The patient was monitored with continuous oximetry and received supplemental nasal cannula oxygen throughout the procedure. In addition, serial blood pressure measurements and continuous electrocardiography showed these physiologic parameters to remain tolerable throughout the procedure.   Assessment and Plan/Additional Comments:follow up pathology reports   Corrin Parker, M.D.  [facility]  Medical Director [place] Director [facility] Cardio-Pulmonary Department

## 2016-03-09 NOTE — Transfer of Care (Signed)
Immediate Anesthesia Transfer of Care Note  Patient: Shawn Lindsey  Procedure(s) Performed: Procedure(s): ELECTROMAGNETIC NAVIGATION BRONCHOSCOPY (N/A)  Patient Location: PACU  Anesthesia Type:General  Level of Consciousness: awake, alert  and oriented  Airway & Oxygen Therapy: Patient Spontanous Breathing and Patient connected to nasal cannula oxygen  Post-op Assessment: Report given to RN and Post -op Vital signs reviewed and stable  Post vital signs: Reviewed and stable  Last Vitals:  Vitals:   03/09/16 1206 03/09/16 1420  BP: 128/68 112/73  Pulse: 66 70  Resp: 18 11  Temp: 36.8 C 36.1 C    Last Pain:  Vitals:   03/09/16 1206  TempSrc: Oral         Complications: No apparent anesthesia complications

## 2016-03-10 ENCOUNTER — Telehealth: Payer: Self-pay | Admitting: Internal Medicine

## 2016-03-10 NOTE — Telephone Encounter (Signed)
Per DK verbally--This could be caused by irritation from tubes that are inserted through pt nose or saline that is used during this procedure. Pt is made aware of this and advise to give [place] a call back if it does not improve in the next few days. Pt voiced understanding. Nothing further needed.

## 2016-03-10 NOTE — Telephone Encounter (Signed)
Pt states in the recovery room yesterday, he started to have a nasal drip, states it is constant, not just a normal drip. He is asking if this is normal after the procedure he had. Please call and advise.

## 2016-03-11 LAB — CYTOLOGY - NON PAP

## 2016-03-11 LAB — SURGICAL PATHOLOGY

## 2016-03-15 ENCOUNTER — Encounter: Payer: Self-pay | Admitting: Internal Medicine

## 2016-03-15 NOTE — Progress Notes (Signed)
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  INTERVAL HISTORY: Patient returns to clinic today for further evaluation and discussion of his recent biopsy results.  He continues to feel well and is asymptomatic. He denies any fevers or recent illnesses. He has no neurologic complaints. He denies any chest pain, cough, hemoptysis, or shortness of breath. He denies any nausea, vomiting, constipation, or diarrhea. He has no urinary complaints. Patient offers no specific complaints today.  REVIEW OF SYSTEMS:   Review of Systems  Constitutional: Negative.   HENT: Negative.        Patient denies dysphagia.  Respiratory: Negative.  Negative for cough, hemoptysis and shortness of breath.   Cardiovascular: Negative.  Negative for chest pain.  Gastrointestinal: Negative.  Negative for abdominal pain.  Genitourinary: Negative.   Musculoskeletal: Negative.   Neurological: Negative.   Psychiatric/Behavioral: Negative.     As per HPI. Otherwise, a complete review of systems is negative.  PAST MEDICAL HISTORY: Past Medical History:  Diagnosis Date  . Anxiety    mild  . Cancer ([place]) 2015   tonsil  . COPD (chronic obstructive pulmonary disease) ([place])   . Diabetes mellitus without complication ([place])   . Elevated cholesterol   . GERD (gastroesophageal reflux disease)   . Hypertension   . Hypothyroidism   . Seizures ([place]) 2015   related to sepsis  . Stroke [facility]) 2007   no peripheral vision on left side of both eyes & left arm gets numb under stress    PAST SURGICAL HISTORY: Past Surgical History:  Procedure Laterality Date  . ELECTROMAGNETIC NAVIGATION BROCHOSCOPY N/A 03/09/2016   Procedure: ELECTROMAGNETIC NAVIGATION  BRONCHOSCOPY;  Surgeon: Flora Lipps, MD;  Location: [facility] ORS;  Service: Cardiopulmonary;  Laterality: N/A;  . TONSILLECTOMY    . TRACHEOSTOMY      FAMILY HISTORY: Reviewed and unchanged.  No reported history of malignancy or chronic disease.     ADVANCED DIRECTIVES:    HEALTH MAINTENANCE: Social History  Substance Use Topics  . Smoking status: Current Every Day Smoker    Packs/day: 0.50    Years: 40.00  . Smokeless tobacco: Never Used  . Alcohol use No     Colonoscopy:  PAP:  Bone density:  Lipid panel:  No Known Allergies  Current Outpatient Prescriptions  Medication Sig Dispense Refill  . amLODipine (NORVASC) 5 MG tablet Take 5 mg by mouth daily.    Marland Kitchen aspirin EC 81 MG tablet Take 81 mg by mouth daily.  0  . atorvastatin (LIPITOR) 80 MG tablet Take 80 mg by mouth daily after breakfast.     . Cholecalciferol 2000 UNITS CAPS Take 2,000 Units by mouth daily.     Marland Kitchen FLUoxetine (PROZAC) 10 MG capsule Take 10 mg by mouth daily.    Marland Kitchen gabapentin (NEURONTIN) 100 MG capsule Take 400 mg by mouth at bedtime.     Marland Kitchen ibuprofen (ADVIL,MOTRIN) 200 MG tablet Take 400 mg by mouth daily after breakfast. In the morning     . levETIRAcetam (KEPPRA) 500 MG tablet Take 500 mg by mouth 2 (two) times daily.    Marland Kitchen levothyroxine (SYNTHROID, LEVOTHROID) 125 MCG tablet Take 125 mcg by mouth daily before breakfast.     . losartan (COZAAR) 100 MG  tablet Take 100 mg by mouth at bedtime.    . magnesium oxide (MAG-OX) 400 MG tablet Take 400 mg by mouth 2 (two) times daily.    . Omega-3 Fatty Acids (FISH OIL) 1000 MG CAPS Take 1,000 mg by mouth daily.     Marland Kitchen omeprazole (PRILOSEC) 20 MG capsule Take 20 mg by mouth daily.    . prochlorperazine (COMPAZINE) 10 MG tablet Take 10 mg by mouth every 6 (six) hours as needed for nausea or vomiting.    . ranitidine (ZANTAC) 150 MG tablet     . umeclidinium bromide (INCRUSE ELLIPTA) 62.5 MCG/INH AEPB Inhale 1 puff into the lungs daily. (Patient taking differently: Inhale  1 puff into the lungs daily after supper. ) 30 each 5  . VENTOLIN HFA 108 (90 Base) MCG/ACT inhaler Inhale 1-2 puffs into the lungs every 6 (six) hours as needed (for shortness of breath/wheezing.).   1   No current facility-administered medications for this visit.     OBJECTIVE: There were no vitals filed for this visit.   There is no height or weight on file to calculate BMI.    ECOG FS:0 - Asymptomatic  General: Well-developed, well-nourished, no acute distress. Eyes: Anicteric sclera. HEENT: Normocephalic, moist mucous membranes, clear oropharnyx. No palpable lymphadenopathy, thyroid midline without nodules. Lungs: Clear to auscultation bilaterally. Heart: Regular rate and rhythm. No rubs, murmurs, or gallops. Abdomen: Soft, nontender, nondistended. No organomegaly noted, normoactive bowel sounds. Musculoskeletal: No edema, cyanosis, or clubbing. Neuro: Alert, answering all questions appropriately. Cranial nerves grossly intact. Skin: No rashes or petechiae noted. Psych: Normal affect.   LAB RESULTS:  Lab Results  Component Value Date   NA 137 10/05/2015   K 3.9 10/05/2015   CL 102 10/05/2015   CO2 27 10/05/2015   GLUCOSE 115 (H) 10/05/2015   BUN 23 (H) 10/05/2015   CREATININE 1.42 (H) 10/05/2015   CALCIUM 9.5 10/05/2015   PROT 7.4 06/18/2015   ALBUMIN 4.3 06/18/2015   AST 19 06/18/2015   ALT 21 06/18/2015   ALKPHOS 78 06/18/2015   BILITOT 0.8 06/18/2015   GFRNONAA 50 (L) 10/05/2015   GFRAA 58 (L) 10/05/2015    Lab Results  Component Value Date   WBC 9.3 03/01/2016   NEUTROABS 7.1 (H) 03/01/2016   HGB 15.8 03/01/2016   HCT 45.8 03/01/2016   MCV 96.7 03/01/2016   PLT 162 03/01/2016     STUDIES: Dg C-arm 1-60 Min-no Report  Result Date: 03/10/2016 Fluoroscopy was utilized by the requesting physician.  No radiographic interpretation.    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 Stage II  squamous cell carcinoma of the left tonsil:  CT scan results reviewed independently with no obvious evidence of recurrence. No intervention is needed at this time. No further imaging is necessary unless there is suspicion for recurrence. Patient states he no longer follows up with ENT.  Return to clinic in March 2018 for routine evaluation. 2. Dysphasia: Resolved. Continue Carafate as needed. 3. Right upper lobe lung mass: CT scan results with slightly worsening nodules. Patient underwent navigational bronchoscopy with repeat biopsy that was negative for malignancy. No further intervention is needed at this time. Given patient is asymptomatic, we will not prescribe antibiotics. Repeat imaging in March 2018 which will be 6 months after his most recent PET scan. Return to clinic one to 2 days later as above. 4.  Thrombocytopenia: Resolved. 5.  Elevated creatinine: Mild. Monitor.  Approximately 30 minutes was spent in discussion of which greater than 50% was consultation.  Patient expressed understanding and was in agreement with this plan. He also understands that He can call clinic at any time with any questions, concerns, or complaints.   Cancer of tonsil   Staging form: Pharynx - Oropharynx, AJCC 7th Edition     Clinical stage from 11/03/2014: Stage II (T2, N0, M0) - Signed by Lloyd Huger, MD on 11/03/2014   Lloyd Huger, MD   03/16/2016 5:21 PM

## 2016-03-16 ENCOUNTER — Encounter: Payer: Self-pay | Admitting: Radiation Oncology

## 2016-03-16 ENCOUNTER — Inpatient Hospital Stay: Payer: Medicare HMO | Attending: Oncology | Admitting: Oncology

## 2016-03-16 ENCOUNTER — Ambulatory Visit
Admission: RE | Admit: 2016-03-16 | Discharge: 2016-03-16 | Disposition: A | Discharge status: Home / Self Care | Payer: Medicare HMO | Source: Ambulatory Visit | Attending: Radiation Oncology | Admitting: Radiation Oncology

## 2016-03-16 VITALS — BP 142/83 | HR 80 | Temp 97.8°F | Resp 20 | Wt 298.1 lb

## 2016-03-16 DIAGNOSIS — Z8673 Personal history of transient ischemic attack (TIA), and cerebral infarction without residual deficits: Secondary | ICD-10-CM | POA: Diagnosis not present

## 2016-03-16 DIAGNOSIS — Z7982 Long term (current) use of aspirin: Secondary | ICD-10-CM | POA: Insufficient documentation

## 2016-03-16 DIAGNOSIS — R918 Other nonspecific abnormal finding of lung field: Secondary | ICD-10-CM | POA: Insufficient documentation

## 2016-03-16 DIAGNOSIS — F17210 Nicotine dependence, cigarettes, uncomplicated: Secondary | ICD-10-CM | POA: Insufficient documentation

## 2016-03-16 DIAGNOSIS — J449 Chronic obstructive pulmonary disease, unspecified: Secondary | ICD-10-CM | POA: Diagnosis not present

## 2016-03-16 DIAGNOSIS — C099 Malignant neoplasm of tonsil, unspecified: Secondary | ICD-10-CM | POA: Insufficient documentation

## 2016-03-16 DIAGNOSIS — Z923 Personal history of irradiation: Secondary | ICD-10-CM | POA: Insufficient documentation

## 2016-03-16 DIAGNOSIS — Z85819 Personal history of malignant neoplasm of unspecified site of lip, oral cavity, and pharynx: Secondary | ICD-10-CM | POA: Diagnosis not present

## 2016-03-16 DIAGNOSIS — Z79899 Other long term (current) drug therapy: Secondary | ICD-10-CM

## 2016-03-16 DIAGNOSIS — E039 Hypothyroidism, unspecified: Secondary | ICD-10-CM | POA: Diagnosis not present

## 2016-03-16 DIAGNOSIS — F419 Anxiety disorder, unspecified: Secondary | ICD-10-CM | POA: Insufficient documentation

## 2016-03-16 DIAGNOSIS — I10 Essential (primary) hypertension: Secondary | ICD-10-CM | POA: Diagnosis not present

## 2016-03-16 DIAGNOSIS — E119 Type 2 diabetes mellitus without complications: Secondary | ICD-10-CM | POA: Diagnosis not present

## 2016-03-16 DIAGNOSIS — Z9221 Personal history of antineoplastic chemotherapy: Secondary | ICD-10-CM | POA: Insufficient documentation

## 2016-03-16 DIAGNOSIS — R944 Abnormal results of kidney function studies: Secondary | ICD-10-CM | POA: Diagnosis not present

## 2016-03-16 DIAGNOSIS — E7800 Pure hypercholesterolemia, unspecified: Secondary | ICD-10-CM | POA: Insufficient documentation

## 2016-03-16 DIAGNOSIS — K219 Gastro-esophageal reflux disease without esophagitis: Secondary | ICD-10-CM | POA: Diagnosis not present

## 2016-03-16 NOTE — Progress Notes (Signed)
Radiation Oncology Follow up Note  Name: Shawn Lindsey   Date:   03/16/2016 MRN:  [id number] DOB: [birth year]    This 65 y.o. male presents to the clinic today for 18 month follow-up status post concurrent chemoradiation for squamous cell carcinoma the tonsil.  REFERRING PROVIDER: Laurell Josephs, MD  HPI: Patient is a 65 year old male now out 18 months having completed concurrent chemoradiation therapy for stage II (T2 N1 M0) squamous cell carcinoma the tonsillar fossa status post resection and adjuvant chemotherapy. He is seen today in routine follow-up. They have been following a. Right upper lobe lung mass which is mildly hypermetabolic and seems to be increasing in size and metabolism since June 2016. He had navigational bronchoscopy last week with cytology and pathology negative. He specifically denies cough hemoptysis or chest tightness. He also has 2 smaller right upper lobe pulmonary nodules one of which is cavitary in both new since June 2016. Side note no evidence of local tumor recurrence in the head and neck region is noted by PET CT criteria. He specifically denies dysphagia or head and neck pain.  COMPLICATIONS OF TREATMENT: none  FOLLOW UP COMPLIANCE: keeps appointments   PHYSICAL EXAM:  BP (!) 142/83   Pulse 80   Temp 97.8 F (36.6 C)   Resp 20   Wt 298 lb 1 oz (135.2 kg)   BMI 42.16 kg/m  Oral cavity is clear with no oral mucosal lesions identified. Indirect mirror examination shows upper or a clear vallecula and base of tongue within normal limits. Neck is clear without evidence of subject gastric cervical or supraclavicular adenopathy. Well-developed well-nourished patient in NAD. HEENT reveals PERLA, EOMI, discs not visualized.  Oral cavity is clear. No oral mucosal lesions are identified. Neck is clear without evidence of cervical or supraclavicular adenopathy. Lungs are clear to A&P. Cardiac examination is essentially unremarkable with regular rate and rhythm without  murmur rub or thrill. Abdomen is benign with no organomegaly or masses noted. Motor sensory and DTR levels are equal and symmetric in the upper and lower extremities. Cranial nerves II through XII are grossly intact. Proprioception is intact. No peripheral adenopathy or edema is identified. No motor or sensory levels are noted. Crude visual fields are within normal range.  RADIOLOGY RESULTS: PET CT and CT scans reviewed and compatible with the above-stated findings  PLAN: At this time I still think we need to closely follow his lung lesions is I am sure these are probably lung cancer non-small cell. Probably would order a CT-guided biopsy of these lesions in lieu of the navigational bronchoscopy at this point. Patient has appointment this afternoon with medical oncology and that will be discussed. From head and neck standpoint he has no evidence of disease. Certainly if this is lung cancer will discuss treatment options at that point with medical oncology. I have otherwise set the patient for six-month follow-up. Will see the patient immediately should this be biopsy-proven lung cancer.  I would like to take this opportunity to thank you for allowing me to participate in the care of your patient.Armstead Peaks., MD

## 2016-04-27 ENCOUNTER — Other Ambulatory Visit: Payer: Self-pay | Admitting: Internal Medicine

## 2016-09-13 ENCOUNTER — Ambulatory Visit
Admission: RE | Admit: 2016-09-13 | Discharge: 2016-09-13 | Disposition: A | Discharge status: Home / Self Care | Payer: Medicare HMO | Source: Ambulatory Visit | Attending: Oncology | Admitting: Oncology

## 2016-09-13 DIAGNOSIS — I288 Other diseases of pulmonary vessels: Secondary | ICD-10-CM | POA: Diagnosis not present

## 2016-09-13 DIAGNOSIS — I700 Atherosclerosis of aorta: Secondary | ICD-10-CM | POA: Insufficient documentation

## 2016-09-13 DIAGNOSIS — C099 Malignant neoplasm of tonsil, unspecified: Secondary | ICD-10-CM | POA: Diagnosis not present

## 2016-09-13 DIAGNOSIS — K8020 Calculus of gallbladder without cholecystitis without obstruction: Secondary | ICD-10-CM | POA: Diagnosis not present

## 2016-09-13 DIAGNOSIS — R918 Other nonspecific abnormal finding of lung field: Secondary | ICD-10-CM

## 2016-09-13 DIAGNOSIS — K769 Liver disease, unspecified: Secondary | ICD-10-CM | POA: Diagnosis not present

## 2016-09-13 DIAGNOSIS — I2510 Atherosclerotic heart disease of native coronary artery without angina pectoris: Secondary | ICD-10-CM | POA: Insufficient documentation

## 2016-09-13 NOTE — Progress Notes (Signed)
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  INTERVAL HISTORY: Patient returns to clinic today for further evaluation and discussion of his imaging results. He continues to feel well and is asymptomatic. He denies any fevers or recent illnesses. He has no neurologic complaints. He denies any chest pain, cough, hemoptysis, or shortness of breath. He denies any nausea, vomiting, constipation, or diarrhea. He has no urinary complaints. Patient offers no specific complaints today.  REVIEW OF SYSTEMS:   Review of Systems  Constitutional: Negative.  Negative for fever, malaise/fatigue and weight loss.  HENT: Negative.  Negative for sore throat.        Patient denies dysphagia.  Respiratory: Negative.  Negative for cough, hemoptysis, shortness of breath and stridor.   Cardiovascular: Negative.  Negative for chest pain and leg swelling.  Gastrointestinal: Negative.  Negative for abdominal pain.  Genitourinary: Negative.   Musculoskeletal: Negative.   Skin: Negative.  Negative for rash.  Neurological: Negative.  Negative for sensory change and weakness.  Psychiatric/Behavioral: Negative.  The patient is not nervous/anxious.     As per HPI. Otherwise, a complete review of systems is negative.  PAST MEDICAL HISTORY: Past Medical History:  Diagnosis Date  . Anxiety    mild  . Cancer ([place]) 2015   tonsil  . COPD (chronic obstructive pulmonary disease) ([place])   . Diabetes mellitus without complication ([place])   . Elevated cholesterol   . GERD (gastroesophageal reflux disease)   . Hypertension   . Hypothyroidism   . Seizures ([place]) 2015   related to sepsis  . Stroke [facility]) 2007   no peripheral vision on left side of both  eyes & left arm gets numb under stress    PAST SURGICAL HISTORY: Past Surgical History:  Procedure Laterality Date  . ELECTROMAGNETIC NAVIGATION BROCHOSCOPY N/A 03/09/2016   Procedure: ELECTROMAGNETIC NAVIGATION BRONCHOSCOPY;  Surgeon: Flora Lipps, MD;  Location: [facility] ORS;  Service: Cardiopulmonary;  Laterality: N/A;  . TONSILLECTOMY    . TRACHEOSTOMY      FAMILY HISTORY: Reviewed and unchanged.  No reported history of malignancy or chronic disease.     ADVANCED DIRECTIVES:    HEALTH MAINTENANCE: Social History  Substance Use Topics  . Smoking status: Current Every Day Smoker    Packs/day: 0.50    Years: 40.00  . Smokeless tobacco: Never Used  . Alcohol use No     Colonoscopy:  PAP:  Bone density:  Lipid panel:  No Known Allergies  Current Outpatient Prescriptions  Medication Sig Dispense Refill  . amLODipine (NORVASC) 5 MG tablet Take 5 mg by mouth daily.    Marland Kitchen aspirin EC 81 MG tablet Take 81 mg by mouth daily.  0  . atorvastatin (LIPITOR) 80 MG tablet Take 80 mg by mouth daily after breakfast.     . Cholecalciferol 2000 UNITS CAPS Take 2,000 Units by mouth daily.     Marland Kitchen FLUoxetine (PROZAC) 10 MG capsule Take 10 mg by mouth daily.    Marland Kitchen gabapentin (NEURONTIN) 100 MG capsule Take 400 mg by mouth at bedtime.     Marland Kitchen ibuprofen (ADVIL,MOTRIN) 200 MG tablet Take 400 mg by mouth daily after breakfast. In the morning     . INCRUSE ELLIPTA 62.5 MCG/INH AEPB inhale 1 puff  by mouth once daily 30 each 5  . levETIRAcetam (KEPPRA) 500 MG tablet Take 500 mg by mouth 2 (two) times daily.    Marland Kitchen levothyroxine (SYNTHROID, LEVOTHROID) 125 MCG tablet Take 125 mcg by mouth daily before breakfast.     . losartan (COZAAR) 100 MG tablet Take 100 mg by mouth at bedtime.    . magnesium oxide (MAG-OX) 400 MG tablet Take 400 mg by mouth 2 (two) times daily.    . Omega-3 Fatty Acids (FISH OIL) 1000 MG CAPS Take 1,000 mg by mouth daily.     Marland Kitchen omeprazole (PRILOSEC) 20 MG capsule Take 20 mg by mouth  daily.    . prochlorperazine (COMPAZINE) 10 MG tablet Take 10 mg by mouth every 6 (six) hours as needed for nausea or vomiting.    . ranitidine (ZANTAC) 150 MG tablet     . VENTOLIN HFA 108 (90 Base) MCG/ACT inhaler Inhale 1-2 puffs into the lungs every 6 (six) hours as needed (for shortness of breath/wheezing.).   1   No current facility-administered medications for this visit.     OBJECTIVE: Vitals:   09/15/16 1046  BP: 132/78  Pulse: 72  Resp: 20  Temp: 98.3 F (36.8 C)     Body mass index is 42.64 kg/m.    ECOG FS:0 - Asymptomatic  General: Well-developed, well-nourished, no acute distress. Eyes: Anicteric sclera. HEENT: Normocephalic, moist mucous membranes, clear oropharnyx. No palpable lymphadenopathy, thyroid midline without nodules. Lungs: Clear to auscultation bilaterally. Heart: Regular rate and rhythm. No rubs, murmurs, or gallops. Abdomen: Soft, nontender, nondistended. No organomegaly noted, normoactive bowel sounds. Musculoskeletal: No edema, cyanosis, or clubbing. Neuro: Alert, answering all questions appropriately. Cranial nerves grossly intact. Skin: No rashes or petechiae noted. Psych: Normal affect.   LAB RESULTS:  Lab Results  Component Value Date   NA 137 10/05/2015   K 3.9 10/05/2015   CL 102 10/05/2015   CO2 27 10/05/2015   GLUCOSE 115 (H) 10/05/2015   BUN 23 (H) 10/05/2015   CREATININE 1.42 (H) 10/05/2015   CALCIUM 9.5 10/05/2015   PROT 7.4 06/18/2015   ALBUMIN 4.3 06/18/2015   AST 19 06/18/2015   ALT 21 06/18/2015   ALKPHOS 78 06/18/2015   BILITOT 0.8 06/18/2015   GFRNONAA 50 (L) 10/05/2015   GFRAA 58 (L) 10/05/2015    Lab Results  Component Value Date   WBC 9.3 03/01/2016   NEUTROABS 7.1 (H) 03/01/2016   HGB 15.8 03/01/2016   HCT 45.8 03/01/2016   MCV 96.7 03/01/2016   PLT 162 03/01/2016     STUDIES: Ct Chest Wo Contrast  Result Date: 09/13/2016 CLINICAL DATA:  Tonsil cancer. Lung mass. Prior negative biopsy of the right  upper lobe. EXAM: CT CHEST WITHOUT CONTRAST TECHNIQUE: Multidetector CT imaging of the chest was performed following the standard protocol without IV contrast. COMPARISON:  PET 01/13/2016.  Chest CT 01/05/2016. FINDINGS: Cardiovascular: Aortic and branch vessel atherosclerosis. Tortuous thoracic aorta. Normal heart size, without pericardial effusion. Multivessel coronary artery atherosclerosis. Pulmonary artery enlargement, outflow tract 3.5 cm. Mediastinum/Nodes: No supraclavicular adenopathy. Similar 11 mm precarinal node, favoring a reactive etiology. Hilar regions poorly evaluated without intravenous contrast. Tiny hiatal hernia. Lungs/Pleura: No pleural fluid.  Moderate centrilobular emphysema. Anterior right upper lobe nodular opacity measures 1.7 x 1.1 cm on image 39/ series 3. Compare 1.8 x 1.2 cm on the prior, similar. More posterior right upper lobe area of consolidation with air bronchograms measures 5.1 x 3.7 cm on image 57/series 3. Compare 4.4  x 3.6 cm on the prior exam (when remeasured). Immediately anterior partially cavitary nodule measures 1.4 x 1.5 cm on image 62/ series 3. Compare 1.2 x 1.0 cm on the prior. Upper Abdomen: 11 mm gallstone. Nonspecific caudate lobe enlargement. Vague area of hypoattenuation in the right hepatic lobe measures 10 mm on image 166/series 2. present on the prior PET and back on 03/12/2015, favoring a benign etiology. Normal imaged portions of the spleen, pancreas, adrenal glands, kidneys. Musculoskeletal: No acute osseous abnormality. IMPRESSION: 1. Mild interval enlargement of right upper lobe lung lesions. Relative slow gross since 01/05/2016 favors an infectious or inflammatory process. Indolent neoplasm cannot be excluded. 2.  Coronary artery atherosclerosis. Aortic atherosclerosis. 3. Pulmonary artery enlargement suggests pulmonary arterial hypertension. 4. Subtle hypoattenuating right hepatic lobe lesion was likely present back to 03/12/2015, favoring a benign  process. Recommend attention on follow-up. 5. Cholelithiasis. 6. Caudate lobe enlargement is nonspecific. Correlate with any risk factors for liver disease. Electronically Signed   By: Abigail Miyamoto M.D.   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 No intervention is needed at this time. No further imaging is necessary unless there is suspicion for recurrence. Patient states he no longer follows up with ENT.  Return to clinic in 6 months for routine evaluation. 2. Dysphasia: Resolved. Continue Carafate as needed. 3. Right upper lobe lung mass: CT scan results from Sep 13, 2016 reviewed independently and reported as above with mild interval enlargement of right upper lobe lung lesion. Patient underwent navigational bronchoscopy with repeat biopsy on March 09, 2016 that was negative for malignancy. No further intervention is needed at this time. Given patient is asymptomatic, we will not prescribe antibiotics. Repeat iin 6 months. Return to clinic 1 to 2 days later for further evaluation and discussion of the results. 4.  Thrombocytopenia: Resolved. 5.  Elevated creatinine: Mild. Monitor.  Approximately 30 minutes was spent in discussion of which greater than 50% was consultation.  Patient expressed understanding and was in agreement with this plan. He also understands that He can call clinic at any time with any questions, concerns, or complaints.   Cancer of tonsil   Staging form: Pharynx - Oropharynx, AJCC 7th Edition     Clinical stage from 11/03/2014: Stage II (T2, N0, M0) - Signed by Lloyd Huger, MD on 11/03/2014   Lloyd Huger, MD   09/17/2016 10:44 AM

## 2016-09-15 ENCOUNTER — Inpatient Hospital Stay: Payer: Medicare HMO | Attending: Oncology | Admitting: Oncology

## 2016-09-15 VITALS — BP 132/78 | HR 72 | Temp 98.3°F | Resp 20 | Wt 301.4 lb

## 2016-09-15 DIAGNOSIS — E7800 Pure hypercholesterolemia, unspecified: Secondary | ICD-10-CM | POA: Diagnosis not present

## 2016-09-15 DIAGNOSIS — C099 Malignant neoplasm of tonsil, unspecified: Secondary | ICD-10-CM

## 2016-09-15 DIAGNOSIS — K8020 Calculus of gallbladder without cholecystitis without obstruction: Secondary | ICD-10-CM | POA: Insufficient documentation

## 2016-09-15 DIAGNOSIS — F17210 Nicotine dependence, cigarettes, uncomplicated: Secondary | ICD-10-CM | POA: Insufficient documentation

## 2016-09-15 DIAGNOSIS — F419 Anxiety disorder, unspecified: Secondary | ICD-10-CM | POA: Diagnosis not present

## 2016-09-15 DIAGNOSIS — R918 Other nonspecific abnormal finding of lung field: Secondary | ICD-10-CM

## 2016-09-15 DIAGNOSIS — K219 Gastro-esophageal reflux disease without esophagitis: Secondary | ICD-10-CM | POA: Diagnosis not present

## 2016-09-15 DIAGNOSIS — I700 Atherosclerosis of aorta: Secondary | ICD-10-CM | POA: Diagnosis not present

## 2016-09-15 DIAGNOSIS — E039 Hypothyroidism, unspecified: Secondary | ICD-10-CM | POA: Insufficient documentation

## 2016-09-15 DIAGNOSIS — E119 Type 2 diabetes mellitus without complications: Secondary | ICD-10-CM | POA: Diagnosis not present

## 2016-09-15 DIAGNOSIS — Z8673 Personal history of transient ischemic attack (TIA), and cerebral infarction without residual deficits: Secondary | ICD-10-CM | POA: Diagnosis not present

## 2016-09-15 DIAGNOSIS — J449 Chronic obstructive pulmonary disease, unspecified: Secondary | ICD-10-CM | POA: Diagnosis not present

## 2016-09-15 DIAGNOSIS — Z7982 Long term (current) use of aspirin: Secondary | ICD-10-CM | POA: Diagnosis not present

## 2016-09-15 DIAGNOSIS — Z79899 Other long term (current) drug therapy: Secondary | ICD-10-CM | POA: Diagnosis not present

## 2016-09-15 DIAGNOSIS — Z85818 Personal history of malignant neoplasm of other sites of lip, oral cavity, and pharynx: Secondary | ICD-10-CM | POA: Diagnosis not present

## 2016-09-15 DIAGNOSIS — I10 Essential (primary) hypertension: Secondary | ICD-10-CM | POA: Diagnosis not present

## 2016-09-15 NOTE — Progress Notes (Signed)
Patient denies any concerns today.  

## 2016-09-23 ENCOUNTER — Other Ambulatory Visit: Payer: Self-pay | Admitting: Family Medicine

## 2016-09-23 DIAGNOSIS — Z0000 Encounter for general adult medical examination without abnormal findings: Secondary | ICD-10-CM

## 2016-09-23 DIAGNOSIS — M5416 Radiculopathy, lumbar region: Secondary | ICD-10-CM

## 2016-10-05 ENCOUNTER — Ambulatory Visit: Payer: Medicare HMO

## 2016-10-05 ENCOUNTER — Ambulatory Visit
Admission: RE | Admit: 2016-10-05 | Discharge: 2016-10-05 | Disposition: A | Discharge status: Home / Self Care | Payer: Medicare HMO | Source: Ambulatory Visit | Attending: Family Medicine | Admitting: Family Medicine

## 2016-10-12 ENCOUNTER — Ambulatory Visit: Payer: Medicare HMO | Admitting: Radiation Oncology

## 2016-10-17 ENCOUNTER — Ambulatory Visit: Payer: Medicare HMO

## 2016-10-24 ENCOUNTER — Other Ambulatory Visit: Payer: Self-pay | Admitting: Internal Medicine

## 2016-11-01 ENCOUNTER — Ambulatory Visit
Admission: RE | Admit: 2016-11-01 | Discharge: 2016-11-01 | Disposition: A | Discharge status: Home / Self Care | Payer: Medicare HMO | Source: Ambulatory Visit | Attending: Family Medicine | Admitting: Family Medicine

## 2016-11-01 DIAGNOSIS — M5416 Radiculopathy, lumbar region: Secondary | ICD-10-CM

## 2016-11-01 DIAGNOSIS — M5136 Other intervertebral disc degeneration, lumbar region: Secondary | ICD-10-CM | POA: Insufficient documentation

## 2016-11-01 DIAGNOSIS — M48061 Spinal stenosis, lumbar region without neurogenic claudication: Secondary | ICD-10-CM | POA: Insufficient documentation

## 2016-11-01 MED ORDER — GADOBENATE DIMEGLUMINE 529 MG/ML IV SOLN
20.0000 mL | Freq: Once | INTRAVENOUS | Status: AC | PRN
  Administered 2016-11-01: 20 mL via INTRAVENOUS

## 2016-11-30 ENCOUNTER — Telehealth: Payer: Self-pay | Admitting: Internal Medicine

## 2016-11-30 MED ORDER — UMECLIDINIUM BROMIDE 62.5 MCG/INH IN AEPB: 1.0000 | INHALATION_SPRAY | Freq: Every day | RESPIRATORY_TRACT | 1 refills | Status: DC

## 2016-11-30 NOTE — Telephone Encounter (Signed)
Returned call to patient. Explained why Incruse had been denied He does have appt scheduled 9/17. Incruse refilled x1.

## 2016-11-30 NOTE — Telephone Encounter (Signed)
Pt calling stating we prescribed him Incruse   It is time for a renewal from [place]  They told patient that we denied the refill  He is confused as to why we did not refill this If we can please look into this and send it to [facility] in [place]   Please advise.

## 2016-12-14 ENCOUNTER — Ambulatory Visit: Payer: Medicare HMO | Attending: Physical Medicine & Rehabilitation

## 2016-12-14 DIAGNOSIS — M2569 Stiffness of other specified joint, not elsewhere classified: Secondary | ICD-10-CM

## 2016-12-14 DIAGNOSIS — R29898 Other symptoms and signs involving the musculoskeletal system: Secondary | ICD-10-CM | POA: Insufficient documentation

## 2016-12-14 DIAGNOSIS — M5441 Lumbago with sciatica, right side: Secondary | ICD-10-CM | POA: Diagnosis present

## 2016-12-14 DIAGNOSIS — M2560 Stiffness of unspecified joint, not elsewhere classified: Secondary | ICD-10-CM

## 2016-12-14 DIAGNOSIS — G8929 Other chronic pain: Secondary | ICD-10-CM

## 2016-12-14 DIAGNOSIS — R2689 Other abnormalities of gait and mobility: Secondary | ICD-10-CM | POA: Diagnosis present

## 2016-12-14 NOTE — Therapy (Signed)
[place] MAIN [facility] SERVICES [address] [place], [place], [address] Phone: [phone]   Fax:  [phone]  Physical Therapy Evaluation  Patient Details  Name: Shawn Lindsey MRN: [id number] Date of Birth: April 25, 1951 Referring Provider: Waneta Martins, MD  Encounter Date: 12/14/2016      PT End of Session - 12/14/16 2201    Visit Number 1   Number of Visits 8   Date for PT Re-Evaluation 01/25/17   Authorization - Visit Number 1   Authorization - Number of Visits 10   PT Start Time 1055   PT Stop Time 1152   PT Time Calculation (min) 57 min   Activity Tolerance Patient tolerated treatment well;Patient limited by pain   Behavior During Therapy [facility] for tasks assessed/performed      Past Medical History:  Diagnosis Date  . Anxiety    mild  . 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 COPD (chronic obstructive pulmonary disease) ([place])   . Diabetes mellitus without complication ([place])   . Elevated cholesterol   . GERD (gastroesophageal reflux disease)   . Hypertension   . Hypothyroidism   . Seizures ([place]) 2015   related to sepsis  . Stroke [facility]) 2007   no peripheral vision on left side of both eyes & left arm gets numb under stress    Past Surgical History:  Procedure Laterality Date  . ELECTROMAGNETIC NAVIGATION BROCHOSCOPY N/A 03/09/2016   Procedure: ELECTROMAGNETIC NAVIGATION BRONCHOSCOPY;  Surgeon: Flora Lipps, MD;  Location: [facility] ORS;  Service: Cardiopulmonary;  Laterality: N/A;  . TONSILLECTOMY    . TRACHEOSTOMY      There were no vitals filed for this visit.        Subjective Assessment - 12/14/16 1103    Subjective Patient is a pleasant 66 year old male who presents with back pain.    Patient is accompained by: Family member   Pertinent History Patient is a pleasant 66 year old male who presents with back pain. He reports having a stroke 10 years ago, and had physical therapy at [facility]. 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 Back pain has been chronic throughout lifespan. Was given exercises and would do them and would go away. Now is having a different pain in back. Was having pain going down R leg to top of foot, with pain in R side of back. Has not had recent radiating symptom. Has been taking Motrin for back pain. Can't sleep on back due to breathing, can't sleep on left side due to stroke due to numbness of that side secondary to stroke, sleep on right side hurts all the time. Doesn't use walker at home, furniture surfs.  Doing therapy and if doesn't help then will do steroids into back.    Limitations Sitting;Standing;Lifting;Walking;House hold activities;Other (comment)   How long can you sit comfortably? 15-25 minutes   How long can you stand comfortably? 15-25 minutes   How long can you walk comfortably? 10-20 minutes   Diagnostic tests MRI   Patient Stated Goals decrease back pain   Currently in Pain? Yes   Pain Score 4    Pain Location Back   Pain Orientation Right;Lower   Pain Descriptors / Indicators Aching;Shooting   Pain Type Chronic pain   Pain Onset More than a month ago   Pain Frequency Constant   Aggravating Factors  hurts all the time  Pain Relieving Factors motrin         PAIN: Worst: 7/10 Best 4/10 Current 4/10 Was radiating from R low back to R top of ankle, now no longer radiates  POSTURE: Forward head rounded shoulders, rounded back  PROM/AROM: Trunk Flexion Pain at end range  Trunk Extension WFL,decreases pain  Trunk R SB Increased pain  Trunk L SB Limited no pain  Trunk R rotation Limited no pain  Trunk L rotation Limited no pain   Grade I-II CPA mobilizations painful to lumbar spine L2-L5.  L UPAs nonpainful grade I-III, R UPAs tender to grade II mobilizations. Hypomobile, no change of pain  Knee rotation decreased pain TA contraction decreased pain,  Trunk extensions in seated with rolled up towel decreased  pain  STRENGTH:  Graded on a 0-5 scale Muscle Group Left Right  Hip Flex 4+/5 4+/5  Hip Abd 4+/5 4+/5  Hip Add 4+/5 4+/5  Hip Ext 4+/5 4+/5  Hip IR/ER 4+/5 4+/5  Knee Flex 4+/5 4+/5  Knee Ext 4/5 4/5  Ankle DF 4/5 4/5  Ankle PF 4/5 4/5   SENSATION: WFL  SPECIAL TESTS: SLR -  FUNCTIONAL MOBILITY: Independent transfers.   BALANCE: Requires single UE support to maintain balance   OUTCOME MEASURES: TEST Outcome Interpretation  5 times sit<>stand 14 sec >60 yo, >15 sec indicates increased risk for falls  10 meter walk test         12 =.83       m/s <1.0 m/s indicates increased risk for falls; limited community ambulator  MODI=50% =severe disability FABQ=13/30     Objective measurements completed on examination: See above findings.   Tx TA contraction  TA heel slides Windshield wiper Seated hamstring stretch Supine hip flexor stretch Trunk extension in seated        [facility] PT Assessment - 12/14/16 0001      Assessment   Medical Diagnosis lumbar radiculopathy   Referring Provider Waneta Martins, MD   Onset Date/Surgical Date 08/01/16   Hand Dominance Left   Next MD Visit uncertain   Prior Therapy yes but not for back     Precautions   Precautions Fall   Precaution Comments requires AD     Restrictions   Weight Bearing Restrictions No     Balance Screen   Has the patient fallen in the past 6 months No   Has the patient had a decrease in activity level because of a fear of falling?  No   Is the patient reluctant to leave their home because of a fear of falling?  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, music   Overall Cognitive Status Within Functional Limits for tasks assessed     Sensation   Light Touch Appears Intact     Coordination   Gross Motor Movements are Fluid and Coordinated Yes     Posture/Postural Control   Posture/Postural Control Postural limitations   Postural Limitations Rounded Shoulders;Forward head;Flexed  trunk     Palpation   Spinal mobility hypomobile L3-4, tenderness to Grade II CPA's     Transfers   Transfers Independent with all Transfers   Five time sit to stand comments  difficulty standing full height      Ambulation/Gait   Ambulation/Gait Yes   Ambulation/Gait Assistance 6: Modified independent (Device/Increase time)   Assistive device Standard walker   Gait Pattern Step-through pattern;Right flexed knee in stance;Shuffle;Trunk flexed;Narrow base of support   Ambulation Surface Level   Gait velocity .17                   PT Education - 12/14/16 2200    Education provided Yes   Education Details HEP, back anatomy and diagnosis, meaning of pain,    Person(s) Educated Patient;Child(ren)   Methods Explanation;Demonstration;Handout   Comprehension Verbalized understanding;Returned demonstration          PT Short Term Goals - 12/14/16 2214      PT SHORT TERM GOAL #1   Title Patient will tolerate grade III CPA mobilizations to lumbar spine to improve mobility for pain relief   Baseline does not tolerate grade III mobilizations   Time 2   Period Weeks   Status New   Target Date 12/28/16     PT SHORT TERM GOAL #2   Title Patient will demonstrate independence with HEP to progress towards more mobile pain free movement   Baseline HEP given   Time 2   Period Weeks   Status New   Target Date 12/28/16           PT Long Term Goals - 12/14/16 2216      PT LONG TERM GOAL #1   Title Patient will report a worst pain  of 3/10 on VAS  to improve tolerance with ADLs and reduced symptoms with activities.    Baseline worst pain 7/10   Time 6   Period Weeks   Status New   Target Date 01/25/17     PT LONG TERM GOAL #2   Title Patient will tolerate sitting unsupported demonstrating erect sitting posture with minimal thoracic kyphosis for 15+ minutes with maximum of 5/10 back pain to demonstrate improved back extensor strength and improved sitting tolerance.    Baseline forward head rounded shoulders   Time 6   Period Weeks   Status New   Target Date 01/25/17     PT LONG TERM GOAL #3   Title Patient will reduce modified Oswestry score to <20 as to demonstrate minimal disability with ADLs including improved sleeping tolerance, walking/sitting tolerance etc for better mobility with ADLs   Baseline 8/15: 50%   Time 6   Period Weeks   Status New   Target Date 01/25/17     PT LONG TERM GOAL #4   Title Patient will decrease FABQ score to > 9/30 for improved quality of life   Baseline 8/15: 13/30   Time 6   Period Weeks   Status New   Target Date 01/25/17                Plan - 12/14/16 2203    Clinical Impression Statement Patient is a pleasant 66 year old male who presents to physical therapy for low back pain. Prior to evaluation patient had radicular symptoms from R back to RLE around ankle level, however radicular symptoms were not noted prior to,throughout, or after evaluation. Extension relieved pt.'s pain and R sidebending and flexion exacerbated pain. Poor  abdominal strength and tight LE musculature exacerbate symptoms. Hypomobility of lumbar spine with pain during grade I-II CPAs and R UPAs noted. MODI=50%, FABQ=13/305x STS=14 seconds, 10MWT=.83 m/s. Patient is limited in ability to attend therapy due to reliance upon others for rides. Patient will benefit from skilled physical therapy to decrease pain and improve quality of life.     History and Personal Factors relevant to plan of care: This  patient presents with  3, personal factors/ comorbidities and 3, ,body elements including body structures and functions, activity limitations and or participation restrictions. Patient's condition is stable,    Clinical Presentation Stable   Clinical Presentation due to: chronicity of back pain   Clinical Decision Making Moderate   Rehab Potential Fair   Clinical Impairments Affecting Rehab Potential (-) chronicity of pain, history of cancer, anxiety, diabetes, blood pressure, stroke, seizures, and bronchitis, current smoker. (+) motivated   PT Frequency 1x / week   PT Duration 6 weeks   PT Treatment/Interventions ADLs/Self Care Home Management;Cryotherapy;Electrical Stimulation;Iontophoresis 4mg /ml Dexamethasone;Moist Heat;Therapeutic exercise;Therapeutic activities;Functional mobility training;Stair training;Balance training;Neuromuscular re-education;Gait training;Patient/family education;Manual techniques;Passive range of motion;Energy conservation;Dry needling   PT Next Visit Plan review HEP, mobilizations, stretch   PT Home Exercise Plan see sheet   Consulted and Agree with Plan of Care Patient      Patient will benefit from skilled therapeutic intervention in order to improve the following deficits and impairments:  Abnormal gait, Decreased activity tolerance, Decreased balance, Decreased range of motion, Decreased safety awareness, Decreased strength, Decreased mobility, Difficulty walking, Hypomobility, Impaired flexibility, Improper body mechanics, Postural dysfunction, Pain  Visit Diagnosis: Chronic right-sided low back pain with right-sided sciatica  Decreased range of motion of trunk and back  Other abnormalities of gait and mobility      G-Codes - January 07, 2017 08/04/2218    Functional Assessment Tool Used (Outpatient Only) VAS, MODI, FABQ, MMT, 5x STS, 10MWT, clinical judgement   Functional Limitation Mobility: Walking and moving around   Mobility: Walking and Moving Around Current  Status [phone]) At least 40 percent but less than 60 percent impaired, limited or restricted   Mobility: Walking and Moving Around Goal Status [phone]) At least 20 percent but less than 40 percent impaired, limited or restricted       Problem List Patient Active Problem List   Diagnosis Date Noted  . Mass of upper lobe of right lung 09/29/2015  . 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 HTN (hypertension) 08/05/2014  . COPD (chronic obstructive pulmonary disease) ([place]) 08/05/2014  . Hyperlipidemia 08/05/2014   Janna Arch, PT, DPT   Janna Arch 01/07/17, 10:22 PM  [place] MAIN [facility] SERVICES [address] [place], [place], [address] Phone: [phone]   Fax:  [phone]  Name: BLANE WORTHINGTON MRN: [id number] Date of Birth: 01/25/1951

## 2016-12-19 ENCOUNTER — Ambulatory Visit: Payer: Medicare HMO

## 2016-12-21 ENCOUNTER — Ambulatory Visit: Payer: Medicare HMO

## 2016-12-27 ENCOUNTER — Ambulatory Visit: Payer: Medicare HMO

## 2016-12-27 DIAGNOSIS — M2560 Stiffness of unspecified joint, not elsewhere classified: Secondary | ICD-10-CM

## 2016-12-27 DIAGNOSIS — R2689 Other abnormalities of gait and mobility: Secondary | ICD-10-CM

## 2016-12-27 DIAGNOSIS — M5441 Lumbago with sciatica, right side: Principal | ICD-10-CM

## 2016-12-27 DIAGNOSIS — M2569 Stiffness of other specified joint, not elsewhere classified: Secondary | ICD-10-CM

## 2016-12-27 DIAGNOSIS — G8929 Other chronic pain: Secondary | ICD-10-CM

## 2016-12-27 NOTE — Therapy (Signed)
[place] MAIN [facility] SERVICES [address] [place], [place], [address] Phone: [phone]   Fax:  [phone]  Physical Therapy Treatment  Patient Details  Name: Shawn Lindsey MRN: [id number] Date of Birth: 1950/06/23 Referring Provider: Waneta Martins, MD  Encounter Date: 12/27/2016      PT End of Session - 12/27/16 1042    Visit Number 2   Number of Visits 8   Date for PT Re-Evaluation 01/25/17   Authorization - Visit Number 2   Authorization - Number of Visits 10   PT Start Time 0945   PT Stop Time 1032   PT Time Calculation (min) 47 min   Activity Tolerance Patient tolerated treatment well;Patient limited by pain   Behavior During Therapy [name] for tasks assessed/performed      Past Medical History:  Diagnosis Date  . Anxiety    mild  . 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 COPD (chronic obstructive pulmonary disease) ([place])   . Diabetes mellitus without complication ([place])   . Elevated cholesterol   . GERD (gastroesophageal reflux disease)   . Hypertension   . Hypothyroidism   . Seizures ([place]) 2015   related to sepsis  . Stroke [facility]) 2007   no peripheral vision on left side of both eyes & left arm gets numb under stress    Past Surgical History:  Procedure Laterality Date  . ELECTROMAGNETIC NAVIGATION BROCHOSCOPY N/A 03/09/2016   Procedure: ELECTROMAGNETIC NAVIGATION BRONCHOSCOPY;  Surgeon: Flora Lipps, MD;  Location: [facility] ORS;  Service: Cardiopulmonary;  Laterality: N/A;  . TONSILLECTOMY    . TRACHEOSTOMY      There were no vitals filed for this visit.       Subjective Assessment - 12/27/16 1041    Subjective Patient reports compliance with HEP. Has been very social over the weekend. No noted back pain now, worst back pain noted in middle of night 5/10.    Patient is accompained by: Family member   Pertinent History Patient is a pleasant 66 year old male who presents with back pain. He reports having a stroke 10 years  ago, and had physical therapy at [facility]. 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 Back pain has been chronic throughout lifespan. Was given exercises and would do them and would go away. Now is having a different pain in back. Was having pain going down R leg to top of foot, with pain in R side of back. Has not had recent radiating symptom. Has been taking Motrin for back pain. Can't sleep on back due to breathing, can't sleep on left side due to stroke due to numbness of that side secondary to stroke, sleep on right side hurts all the time. Doesn't use walker at home, furniture surfs.  Doing therapy and if doesn't help then will do steroids into back.    Limitations Sitting;Standing;Lifting;Walking;House hold activities;Other (comment)   How long can you sit comfortably? 15-25 minutes   How long can you stand comfortably? 15-25 minutes   How long can you walk comfortably? 10-20 minutes   Diagnostic tests MRI   Patient Stated Goals decrease back pain   Currently in Pain? No/denies      0/10 pain Manual  CPAs UPAs to lumbar region , 7x15 seconds each level grade I-III Inferior R pelvic mobilization grade I-II, sacral inferior mobilization grade I-II  PROM hip flexors 2x60 seconds prone  TherEx Trunk extension in seated over towel x 10 Seated IT band stretch 2x30 seconds each leg, cues for breathing Seated heel shin slides for ROM and stretch 10x each leg Seated marching with tall posture x 20 cues for abdominal control Seated posture in mirror 2x2 minutes Seated scapular retractions 20x in mirror pinching PTs fingers with shoulder blades Purse lipped breathing Sit to stand transfers Supine to sit transfers, prone to sit tranfers  cramps when laying on back                           PT Education - 12/27/16 1042    Education provided Yes   Education Details HEP correction, posture   Person(s) Educated Patient    Methods Explanation;Demonstration;Verbal cues   Comprehension Verbalized understanding;Returned demonstration;Verbal cues required          PT Short Term Goals - 12/27/16 1044      PT SHORT TERM GOAL #1   Title Patient will tolerate grade III CPA mobilizations to lumbar spine to improve mobility for pain relief   Baseline tolerates grade III CPA mobiliations    Time 2   Period Weeks   Status Achieved     PT SHORT TERM GOAL #2   Title Patient will demonstrate independence with HEP to progress towards more mobile pain free movement   Baseline HEP require correction   Time 2   Period Weeks   Status Partially Met           PT Long Term Goals - 12/14/16 2216      PT LONG TERM GOAL #1   Title Patient will report a worst pain of 3/10 on VAS  to improve tolerance with ADLs and reduced symptoms with activities.    Baseline worst pain 7/10   Time 6   Period Weeks   Status New   Target Date 01/25/17     PT LONG TERM GOAL #2   Title Patient will tolerate sitting unsupported demonstrating erect sitting posture with minimal thoracic kyphosis for 15+ minutes with maximum of 5/10 back pain to demonstrate improved back extensor strength and improved sitting tolerance.    Baseline forward head rounded shoulders   Time 6   Period Weeks   Status New   Target Date 01/25/17     PT LONG TERM GOAL #3   Title Patient will reduce modified Oswestry score to <20 as to demonstrate minimal disability with ADLs including improved sleeping tolerance, walking/sitting tolerance etc for better mobility with ADLs   Baseline 8/15: 50%   Time 6   Period Weeks   Status New   Target Date 01/25/17     PT LONG TERM GOAL #4   Title Patient will decrease FABQ score to > 9/30 for improved quality of life   Baseline 8/15: 13/30   Time 6   Period Weeks   Status New   Target Date 01/25/17               Plan - 12/27/16 1043    Clinical Impression Statement Patient had frequent cramps in R  abdomen side when in supine, discontinued all supine motion for rest of therapy session. Tight musculature exacerbates patients R pelvic/lumbar pain. Hypomobility of lumbar spine relieved with repetitive mobilizations grade I-III. Patient will continue to benefit from skilled physical therapy to decrease pain and improve quality of life.    Rehab Potential Fair   Clinical Impairments Affecting Rehab Potential (-)  chronicity of pain, history of cancer, anxiety, diabetes, blood pressure, stroke, seizures, and bronchitis, current smoker. (+) motivated   PT Frequency 1x / week   PT Duration 6 weeks   PT Treatment/Interventions ADLs/Self Care Home Management;Cryotherapy;Electrical Stimulation;Iontophoresis 84m/ml Dexamethasone;Moist Heat;Therapeutic exercise;Therapeutic activities;Functional mobility training;Stair training;Balance training;Neuromuscular re-education;Gait training;Patient/family education;Manual techniques;Passive range of motion;Energy conservation;Dry needling   PT Next Visit Plan review HEP, mobilizations, stretch   PT Home Exercise Plan see sheet   Consulted and Agree with Plan of Care Patient      Patient will benefit from skilled therapeutic intervention in order to improve the following deficits and impairments:  Abnormal gait, Decreased activity tolerance, Decreased balance, Decreased range of motion, Decreased safety awareness, Decreased strength, Decreased mobility, Difficulty walking, Hypomobility, Impaired flexibility, Improper body mechanics, Postural dysfunction, Pain  Visit Diagnosis: Chronic right-sided low back pain with right-sided sciatica  Decreased range of motion of trunk and back  Other abnormalities of gait and mobility     Problem List Patient Active Problem List   Diagnosis Date Noted  . Mass of upper lobe of right lung 09/29/2015  . [place] of [place] 08/05/2014  . HTN (hypertension) 08/05/2014  . COPD (chronic obstructive pulmonary disease)  ([place] 08/05/2014  . Hyperlipidemia 08/05/2014   MJanna Arch PT, DPT   MJanna Arch8/28/2018, 10:45 AM  [place] [facility] [address] [address]Phone: [phone]  Fax:  [phone] Name: Shawn DACOSTAMRN: [id number] of Birth: [birth year]

## 2016-12-28 ENCOUNTER — Other Ambulatory Visit: Payer: Self-pay | Admitting: Internal Medicine

## 2017-01-03 ENCOUNTER — Ambulatory Visit: Payer: Medicare HMO | Attending: Family Medicine

## 2017-01-10 ENCOUNTER — Ambulatory Visit: Payer: Medicare HMO

## 2017-01-16 ENCOUNTER — Encounter: Payer: Self-pay | Admitting: Internal Medicine

## 2017-01-16 ENCOUNTER — Ambulatory Visit (INDEPENDENT_AMBULATORY_CARE_PROVIDER_SITE_OTHER): Payer: Medicare HMO | Admitting: Internal Medicine

## 2017-01-16 VITALS — BP 140/88 | HR 77 | Resp 16 | Ht 70.5 in | Wt 317.0 lb

## 2017-01-16 DIAGNOSIS — J441 Chronic obstructive pulmonary disease with (acute) exacerbation: Secondary | ICD-10-CM | POA: Diagnosis not present

## 2017-01-16 DIAGNOSIS — G4719 Other hypersomnia: Secondary | ICD-10-CM

## 2017-01-16 MED ORDER — UMECLIDINIUM BROMIDE 62.5 MCG/INH IN AEPB: 1.0000 | INHALATION_SPRAY | Freq: Every day | RESPIRATORY_TRACT | 12 refills | Status: DC

## 2017-01-16 NOTE — Patient Instructions (Signed)
Stop smoking Obtain sleep study Continue inhalers as prescribed

## 2017-01-16 NOTE — Progress Notes (Signed)
Shawn [facility]      Date: 01/16/2017,   MRN# [id number] Shawn [place] 22-Jun-1950       AdmissionWeight: (!) 317 lb (143.8 kg)                 CurrentWeight: (!) 317 lb (143.8 kg) Shawn [place] is a 66 y.o. old male seen in [place] for RUL nodule and opacity with COPD at the request of Dr. Alyssa Grove.     CHIEF COMPLAINT:   I have abnormal CT scans Follow up excessive daytime sleepiness Follow up COPD   HISTORY OF PRESENT ILLNESS   66 yo white male seen today for h/o RUL nodular opacity previos opacity seen approx 1 year ago on CT chest Repeat CT chest on 07/02/2015 RUL cavitary lesions measures 5.0 x 2.2 x 3.6 cm.  Previously 5.4 x 2.5 x 3.5 cm.  Repeat CT chest 01/05/16 shows RUL cavitary mass 4.8x4.1 CM, previous measurement 4.6 x 3.8CM Repeat CT chest 08/2016 no significant change in size of RUL mass    Previous history entails Patient has been hospitalized approx 5  Years ago for pneumonia and resp failure requiring ICU care s/p trach Patient subsequently 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 patient sees Dr. Grayland Ormond for cancer care  Patient has had no significant weight loss, denies fevers, chills night sweats Patient has COPD and seems to be doing Ok on incruse Patient has no active wheezing today, denies CP,SOB today. Still smokes, +intermittent wheezing  PFT 12/2015   ratio 72%, FEv1 90% DLCo 57% 6MWT WNL  Office Arlyce Harman 01/16/17 Ratio 74% Fev1 92% predicted  seen today for problems with sleep Patient  has been having sleep problems for over one year Patient has been having excessive daytime sleepiness Patient has been having extreme fatigue and tiredness, lack of energy +  very Loud snoring every night + struggling breathe at night and gasps for air Patient and I have discussed getting a sleep study in the past however with multiple oncology visits and scans patient  is now ready for sleep study assessment  Epworth score is 11    Current Medication:  Current Outpatient Prescriptions:  .  amLODipine (NORVASC) 5 MG tablet, Take 5 mg by mouth daily., Disp: , Rfl:  .  aspirin EC 81 MG tablet, Take 81 mg by mouth daily., Disp: , Rfl: 0 .  atorvastatin (LIPITOR) 80 MG tablet, Take 80 mg by mouth daily after breakfast. , Disp: , Rfl:  .  Cholecalciferol 2000 UNITS CAPS, Take 2,000 Units by mouth daily. , Disp: , Rfl:  .  FLUoxetine (PROZAC) 10 MG capsule, Take 10 mg by mouth daily., Disp: , Rfl:  .  gabapentin (NEURONTIN) 100 MG capsule, Take 400 mg by mouth at bedtime. , Disp: , Rfl:  .  ibuprofen (ADVIL,MOTRIN) 200 MG tablet, Take 400 mg by mouth daily after breakfast. In the morning , Disp: , Rfl:  .  INCRUSE ELLIPTA 62.5 MCG/INH AEPB, INHALE 1 PUFF INTO THE LUNGS DAILY, Disp: 1 each, Rfl: 0 .  levETIRAcetam (KEPPRA) 500 MG tablet, Take 500 mg by mouth 2 (two) times daily., Disp: , Rfl:  .  levothyroxine (SYNTHROID, LEVOTHROID) 125 MCG tablet, Take 125 mcg by mouth daily before breakfast. , Disp: , Rfl:  .  losartan (COZAAR) 100 MG tablet, Take 100 mg by mouth at bedtime., Disp: , Rfl:  .  magnesium oxide (MAG-OX)  400 MG tablet, Take 400 mg by mouth 2 (two) times daily., Disp: , Rfl:  .  Omega-3 Fatty Acids (FISH OIL) 1000 MG CAPS, Take 1,000 mg by mouth daily. , Disp: , Rfl:  .  omeprazole (PRILOSEC) 20 MG capsule, Take 20 mg by mouth daily., Disp: , Rfl:  .  prochlorperazine (COMPAZINE) 10 MG tablet, Take 10 mg by mouth every 6 (six) hours as needed for nausea or vomiting., Disp: , Rfl:  .  ranitidine (ZANTAC) 150 MG tablet, , Disp: , Rfl:  .  VENTOLIN HFA 108 (90 Base) MCG/ACT inhaler, Inhale 1-2 puffs into the lungs every 6 (six) hours as needed (for shortness of breath/wheezing.). , Disp: , Rfl: 1    ALLERGIES   Patient has no known allergies.     REVIEW OF SYSTEMS   Review of Systems  Constitutional: Negative for chills, fever and weight  loss.  HENT: Negative.  Negative for congestion.   Respiratory: Negative for cough, hemoptysis, sputum production, shortness of breath and wheezing.   Cardiovascular: Negative for chest pain, palpitations and orthopnea.  Gastrointestinal: Negative for heartburn, nausea and vomiting.  Skin: Negative for rash.  Psychiatric/Behavioral: The patient is not nervous/anxious.   All other systems reviewed and are negative.   BP (!) 148/88 (BP Location: Left Arm, Cuff Size: Large)   Pulse 77   Resp 16   Ht 5' 10.5" (1.791 m)   Wt (!) 317 lb (143.8 kg)   SpO2 96%   BMI 44.84 kg/m      PHYSICAL EXAM  Physical Exam  Constitutional: He is oriented to person, place, and time. He appears well-developed and well-nourished. No distress.  Eyes: Pupils are equal, round, and reactive to light. EOM are normal.  Neck: Neck supple.  Cardiovascular: Normal rate, regular rhythm and normal heart sounds.   No murmur heard. Pulmonary/Chest: Effort normal and breath sounds normal. No stridor. No respiratory distress. He has no wheezes.  Musculoskeletal: Normal range of motion. He exhibits no edema.  Neurological: He is alert and oriented to person, place, and time.  Skin: Skin is warm. He is not diaphoretic.  Psychiatric: He has a normal mood and affect.     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 RUL cavitary lesion/Right upper lobe lung mass:  will follow along with oncology CT scan results from Sep 13, 2016 reviewed independently and reported as above with mild interval  enlargement of right upper lobe lung lesion. Patient underwent navigational bronchoscopy with repeat biopsy on March 09, 2016 that was negative for malignancy. No further intervention is needed at this time. Given patient is asymptomatic, we will not prescribe antibiotics. Repeat iin 6 months.    3. 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 No intervention is needed at this time. No further imaging is necessary unless there is suspicion for recurrence as per onolocgy  4. Dysphasia: Resolved. Continue Carafate as needed.   5. Smoking cessation strongly advised Counseling for approx 5 minutes   6.Obesity -recommend  significant weight loss -recommend changing diet  7.Deconditioned state -Recommend increased daily activity and exercise  8. Excessive daytime sleepiness with fatigue We'll need obtain sleep study ASAP    Patient satisfied with Plan of action and management. All questions answered  Corrin Parker, M.D.  [facility]  Medical Director [place] Director [facility] Cardio-Pulmonary Department

## 2017-01-17 ENCOUNTER — Ambulatory Visit: Payer: Medicare HMO

## 2017-01-24 ENCOUNTER — Ambulatory Visit: Payer: Medicare HMO

## 2017-01-26 ENCOUNTER — Ambulatory Visit: Payer: Medicare HMO

## 2017-02-07 ENCOUNTER — Ambulatory Visit: Payer: Medicare HMO | Attending: Internal Medicine

## 2017-02-07 DIAGNOSIS — G4761 Periodic limb movement disorder: Secondary | ICD-10-CM | POA: Insufficient documentation

## 2017-02-07 DIAGNOSIS — G4733 Obstructive sleep apnea (adult) (pediatric): Secondary | ICD-10-CM | POA: Diagnosis not present

## 2017-02-07 DIAGNOSIS — G4710 Hypersomnia, unspecified: Secondary | ICD-10-CM | POA: Diagnosis present

## 2017-02-10 DIAGNOSIS — G4733 Obstructive sleep apnea (adult) (pediatric): Secondary | ICD-10-CM | POA: Diagnosis not present

## 2017-02-13 ENCOUNTER — Telehealth: Payer: Self-pay | Admitting: *Deleted

## 2017-02-13 DIAGNOSIS — G4733 Obstructive sleep apnea (adult) (pediatric): Secondary | ICD-10-CM

## 2017-02-13 NOTE — Telephone Encounter (Signed)
Patient aware of sleep study results Orders placed. Nothing further needed.

## 2017-02-13 NOTE — Telephone Encounter (Signed)
Patient returning call.

## 2017-02-13 NOTE — Telephone Encounter (Signed)
Patient returning call re: sleep study results

## 2017-02-13 NOTE — Telephone Encounter (Signed)
LMTCB

## 2017-02-13 NOTE — Telephone Encounter (Signed)
-----   Message from Laverle Hobby, MD sent at 02/10/2017  3:28 PM EDT ----- Regarding: sleep study results.  Recommend:  Auto CPAP with pressure range of 10-14 cm H2O.  The patient was fitted with a   FF Simplus Mask, Large

## 2017-02-23 ENCOUNTER — Telehealth: Payer: Self-pay | Admitting: Internal Medicine

## 2017-02-23 DIAGNOSIS — G4733 Obstructive sleep apnea (adult) (pediatric): Secondary | ICD-10-CM

## 2017-02-23 NOTE — Telephone Encounter (Signed)
Returned call and new order has been placed.

## 2017-02-23 NOTE — Telephone Encounter (Signed)
[facility]awn [place] calling from [employer]  They have an order for CPAP  Pt is on oxygen but they didn't have an order for that   Would like a call back to clarify there should be an order or if there is another option    Please call back

## 2017-03-08 ENCOUNTER — Telehealth: Payer: Self-pay | Admitting: Oncology

## 2017-03-08 NOTE — Telephone Encounter (Signed)
Left message about appt change due to Woodfin Ganja being out of office that week. Asked to call office for appt details. Mailed new appt letter.

## 2017-03-16 ENCOUNTER — Ambulatory Visit
Admission: RE | Admit: 2017-03-16 | Discharge: 2017-03-16 | Disposition: A | Discharge status: Home / Self Care | Payer: Medicare HMO | Source: Ambulatory Visit | Attending: Oncology | Admitting: Oncology

## 2017-03-16 DIAGNOSIS — K8020 Calculus of gallbladder without cholecystitis without obstruction: Secondary | ICD-10-CM | POA: Diagnosis not present

## 2017-03-16 DIAGNOSIS — I700 Atherosclerosis of aorta: Secondary | ICD-10-CM | POA: Diagnosis not present

## 2017-03-16 DIAGNOSIS — C099 Malignant neoplasm of tonsil, unspecified: Secondary | ICD-10-CM

## 2017-03-16 DIAGNOSIS — R918 Other nonspecific abnormal finding of lung field: Secondary | ICD-10-CM | POA: Diagnosis not present

## 2017-03-16 DIAGNOSIS — J432 Centrilobular emphysema: Secondary | ICD-10-CM | POA: Diagnosis not present

## 2017-03-18 NOTE — Progress Notes (Signed)
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  INTERVAL HISTORY: Patient returns to clinic today for further evaluation and discussion of his imaging results. He continues to feel well and is asymptomatic. He denies any fevers or recent illnesses. He has no neurologic complaints. He denies any chest pain, cough, hemoptysis, or shortness of breath. He denies any nausea, vomiting, constipation, or diarrhea. He has no urinary complaints. Patient offers no specific complaints today.  REVIEW OF SYSTEMS:   Review of Systems  Constitutional: Negative.  Negative for fever, malaise/fatigue and weight loss.  HENT: Negative.  Negative for sore throat.        Patient denies dysphagia.  Respiratory: Negative.  Negative for cough, hemoptysis, shortness of breath and stridor.   Cardiovascular: Negative.  Negative for chest pain and leg swelling.  Gastrointestinal: Negative.  Negative for abdominal pain.  Genitourinary: Negative.   Musculoskeletal: Negative.   Skin: Negative.  Negative for rash.  Neurological: Negative.  Negative for sensory change and weakness.  Psychiatric/Behavioral: Negative.  The patient is not nervous/anxious.     As per HPI. Otherwise, a complete review of systems is negative.  PAST MEDICAL HISTORY: Past Medical History:  Diagnosis Date  . Anxiety    mild  . Cancer ([place]) 2015   tonsil  . COPD (chronic obstructive pulmonary disease) ([place])   . Diabetes mellitus without complication ([place])   . Elevated cholesterol   . GERD (gastroesophageal reflux disease)   . Hypertension   . Hypothyroidism   . Seizures ([place]) 2015   related to sepsis  . Stroke [facility]) 2007   no peripheral vision on left side of both  eyes & left arm gets numb under stress    PAST SURGICAL HISTORY: Past Surgical History:  Procedure Laterality Date  . ELECTROMAGNETIC NAVIGATION BRONCHOSCOPY N/A 03/09/2016   Performed by Flora Lipps, MD at [facility] ORS  . TONSILLECTOMY    . TRACHEOSTOMY      FAMILY HISTORY: Reviewed and unchanged.  No reported history of malignancy or chronic disease.     ADVANCED DIRECTIVES:    HEALTH MAINTENANCE: Social History   Tobacco Use  . Smoking status: Current Every Day Smoker    Packs/day: 0.50    Years: 40.00    Pack years: 20.00  . Smokeless tobacco: Never Used  Substance Use Topics  . Alcohol use: No  . Drug use: No     Colonoscopy:  PAP:  Bone density:  Lipid panel:  No Known Allergies  Current Outpatient Medications  Medication Sig Dispense Refill  . amLODipine (NORVASC) 5 MG tablet Take 5 mg by mouth daily.    Marland Kitchen aspirin EC 81 MG tablet Take 81 mg by mouth daily.  0  . atorvastatin (LIPITOR) 80 MG tablet Take 80 mg by mouth daily after breakfast.     . Cholecalciferol 2000 UNITS CAPS Take 2,000 Units by mouth daily.     Marland Kitchen FLUoxetine (PROZAC) 10 MG capsule Take 10 mg by mouth daily.    Marland Kitchen gabapentin (NEURONTIN) 100 MG capsule Take 400 mg by mouth at bedtime.     Marland Kitchen ibuprofen (ADVIL,MOTRIN) 200 MG tablet Take 400 mg by mouth daily after breakfast. In the morning     . levETIRAcetam (KEPPRA) 500 MG  tablet Take 500 mg by mouth 2 (two) times daily.    Marland Kitchen levothyroxine (SYNTHROID, LEVOTHROID) 125 MCG tablet Take 125 mcg by mouth daily before breakfast.     . losartan (COZAAR) 100 MG tablet Take 100 mg by mouth at bedtime.    . magnesium oxide (MAG-OX) 400 MG tablet Take 400 mg by mouth 2 (two) times daily.    . Omega-3 Fatty Acids (FISH OIL) 1000 MG CAPS Take 1,000 mg by mouth daily.     Marland Kitchen omeprazole (PRILOSEC) 20 MG capsule Take 20 mg by mouth daily.    . prochlorperazine (COMPAZINE) 10 MG tablet Take 10 mg by mouth every 6 (six) hours as needed for nausea or vomiting.    .  ranitidine (ZANTAC) 150 MG tablet     . umeclidinium bromide (INCRUSE ELLIPTA) 62.5 MCG/INH AEPB Inhale 1 puff into the lungs daily. 1 each 12  . VENTOLIN HFA 108 (90 Base) MCG/ACT inhaler Inhale 1-2 puffs into the lungs every 6 (six) hours as needed (for shortness of breath/wheezing.).   1   No current facility-administered medications for this visit.     OBJECTIVE: Vitals:   03/20/17 1510  BP: (!) 152/81  Pulse: 70  Resp: 18  Temp: (!) 97.3 F (36.3 C)     Body mass index is 45.29 kg/m.    ECOG FS:0 - Asymptomatic  General: Well-developed, well-nourished, no acute distress. Eyes: Anicteric sclera. HEENT: Normocephalic, moist mucous membranes, clear oropharnyx. No palpable lymphadenopathy, thyroid midline without nodules. Lungs: Clear to auscultation bilaterally. Heart: Regular rate and rhythm. No rubs, murmurs, or gallops. Abdomen: Soft, nontender, nondistended. No organomegaly noted, normoactive bowel sounds. Musculoskeletal: No edema, cyanosis, or clubbing. Neuro: Alert, answering all questions appropriately. Cranial nerves grossly intact. Skin: No rashes or petechiae noted. Psych: Normal affect.   LAB RESULTS:  Lab Results  Component Value Date   NA 137 10/05/2015   K 3.9 10/05/2015   CL 102 10/05/2015   CO2 27 10/05/2015   GLUCOSE 115 (H) 10/05/2015   BUN 23 (H) 10/05/2015   CREATININE 1.42 (H) 10/05/2015   CALCIUM 9.5 10/05/2015   PROT 7.4 06/18/2015   ALBUMIN 4.3 06/18/2015   AST 19 06/18/2015   ALT 21 06/18/2015   ALKPHOS 78 06/18/2015   BILITOT 0.8 06/18/2015   GFRNONAA 50 (L) 10/05/2015   GFRAA 58 (L) 10/05/2015    Lab Results  Component Value Date   WBC 9.3 03/01/2016   NEUTROABS 7.1 (H) 03/01/2016   HGB 15.8 03/01/2016   HCT 45.8 03/01/2016   MCV 96.7 03/01/2016   PLT 162 03/01/2016     STUDIES: Ct Chest Wo Contrast  Result Date: 03/16/2017 CLINICAL DATA:  Follow up evaluation. History of lung mass and tonsillar carcinoma. EXAM: CT CHEST  WITHOUT CONTRAST TECHNIQUE: Multidetector CT imaging of the chest was performed following the standard protocol without IV contrast. COMPARISON:  Chest CT 09/13/2016 FINDINGS: Cardiovascular: Heart is mildly enlarged. Coronary arterial vascular calcifications. Trace fluid superior pericardial recess. Thoracic aortic vascular calcifications. Enlarged main pulmonary artery as can be seen with pulmonary arterial hypertension. Mediastinum/Nodes: Stable 11 mm precarinal lymph node (image 50; series 2). Small hiatal hernia. Lungs/Pleura: Suspect mucus within the right mainstem bronchus. Interval increase in size of right upper lobe nodule measuring 1.9 x 1.5 cm (image 53; series 3). Interval increase in size of adjacent right upper lobe mass measuring 2.9 x 4.5 cm (image 52; series 3), previously 4.5 x 2.2 cm when measured at the same level. Centrilobular  emphysematous change. Increased subpleural right upper lobe nodule measuring 2.7 x 1.4 cm (image 34; series 3), previously 1.7 x 1.1 cm. Upper Abdomen: Cholelithiasis. No secondary signs to suggest acute cholecystitis. No acute process. Musculoskeletal: Lumbar spine degenerative changes. No aggressive or acute appearing osseous lesions. IMPRESSION: 1. Continued interval increase in size of right upper lobe lesions when compared to prior exam. Neoplasm is not excluded. Infectious/inflammatory process is an additional consideration. 2. Cholelithiasis. 3. Aortic Atherosclerosis (ICD10-I70.0) and Emphysema (ICD10-J43.9). Electronically Signed   By: Lovey Newcomer M.D.   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 No intervention is needed at this time. No further imaging is necessary unless there is suspicion for recurrence. Patient states he no longer  follows up with ENT.  Return to clinic in 6 months for routine evaluation. 2. Dysphasia: Resolved. Continue Carafate as needed. 3. Right upper lobe lung mass: CT scan results from March 16, 2017 reviewed independently and reported as above with continued mild interval enlargement of right upper lobe lung lesion. Patient underwent navigational bronchoscopy with repeat biopsy on March 09, 2016 that was negative for malignancy. No further intervention is needed at this time, but patient expressed understanding that he would likely have to have a repeat biopsy in the future. Given patient is asymptomatic, will not prescribe antibiotics.  Return to clinic in 6 months with repeat imaging and further evaluation.   4.  Thrombocytopenia: Resolved. 5.  Elevated creatinine: Mild. Monitor.  Approximately 30 minutes was spent in discussion of which greater than 50% was consultation.  Patient expressed understanding and was in agreement with this plan. He also understands that He can call clinic at any time with any questions, concerns, or complaints.   Cancer of tonsil   Staging form: Pharynx - Oropharynx, AJCC 7th Edition     Clinical stage from 11/03/2014: Stage II (T2, N0, M0) - Signed by Lloyd Huger, MD on 11/03/2014   Lloyd Huger, MD   03/20/2017 4:37 PM

## 2017-03-20 ENCOUNTER — Inpatient Hospital Stay: Payer: Medicare HMO | Attending: Oncology | Admitting: Oncology

## 2017-03-20 VITALS — BP 152/81 | HR 70 | Temp 97.3°F | Resp 18 | Wt 320.2 lb

## 2017-03-20 DIAGNOSIS — I10 Essential (primary) hypertension: Secondary | ICD-10-CM | POA: Diagnosis not present

## 2017-03-20 DIAGNOSIS — Z8673 Personal history of transient ischemic attack (TIA), and cerebral infarction without residual deficits: Secondary | ICD-10-CM | POA: Diagnosis not present

## 2017-03-20 DIAGNOSIS — K219 Gastro-esophageal reflux disease without esophagitis: Secondary | ICD-10-CM | POA: Insufficient documentation

## 2017-03-20 DIAGNOSIS — F17210 Nicotine dependence, cigarettes, uncomplicated: Secondary | ICD-10-CM | POA: Diagnosis not present

## 2017-03-20 DIAGNOSIS — Z79899 Other long term (current) drug therapy: Secondary | ICD-10-CM | POA: Diagnosis not present

## 2017-03-20 DIAGNOSIS — K8020 Calculus of gallbladder without cholecystitis without obstruction: Secondary | ICD-10-CM | POA: Diagnosis not present

## 2017-03-20 DIAGNOSIS — E119 Type 2 diabetes mellitus without complications: Secondary | ICD-10-CM | POA: Diagnosis not present

## 2017-03-20 DIAGNOSIS — Z7982 Long term (current) use of aspirin: Secondary | ICD-10-CM | POA: Insufficient documentation

## 2017-03-20 DIAGNOSIS — C099 Malignant neoplasm of tonsil, unspecified: Secondary | ICD-10-CM

## 2017-03-20 DIAGNOSIS — R918 Other nonspecific abnormal finding of lung field: Secondary | ICD-10-CM | POA: Diagnosis not present

## 2017-03-20 DIAGNOSIS — I700 Atherosclerosis of aorta: Secondary | ICD-10-CM | POA: Diagnosis not present

## 2017-03-20 DIAGNOSIS — E039 Hypothyroidism, unspecified: Secondary | ICD-10-CM | POA: Insufficient documentation

## 2017-03-20 DIAGNOSIS — F419 Anxiety disorder, unspecified: Secondary | ICD-10-CM | POA: Diagnosis not present

## 2017-03-20 DIAGNOSIS — K449 Diaphragmatic hernia without obstruction or gangrene: Secondary | ICD-10-CM | POA: Insufficient documentation

## 2017-03-20 DIAGNOSIS — J449 Chronic obstructive pulmonary disease, unspecified: Secondary | ICD-10-CM | POA: Diagnosis not present

## 2017-03-20 DIAGNOSIS — E7800 Pure hypercholesterolemia, unspecified: Secondary | ICD-10-CM | POA: Insufficient documentation

## 2017-03-21 ENCOUNTER — Ambulatory Visit: Payer: Medicare HMO | Admitting: Oncology

## 2017-05-02 DIAGNOSIS — G4730 Sleep apnea, unspecified: Secondary | ICD-10-CM

## 2017-05-02 HISTORY — DX: Sleep apnea, unspecified: G47.30

## 2017-09-07 ENCOUNTER — Telehealth: Payer: Self-pay | Admitting: *Deleted

## 2017-09-07 NOTE — Telephone Encounter (Signed)
1- need to know if patient is set up on cpap? 2- patient due for over-due follow up. 3- need notes to send to [place] with recent 02 sats.

## 2017-09-08 NOTE — Telephone Encounter (Signed)
2nd message left for patient to call back.

## 2017-09-11 NOTE — Telephone Encounter (Signed)
appt scheduled 10/12/17.

## 2017-09-17 NOTE — Progress Notes (Deleted)
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  INTERVAL HISTORY: Patient returns to clinic today for further evaluation and discussion of his imaging results. He continues to feel well and is asymptomatic. He denies any fevers or recent illnesses. He has no neurologic complaints. He denies any chest pain, cough, hemoptysis, or shortness of breath. He denies any nausea, vomiting, constipation, or diarrhea. He has no urinary complaints. Patient offers no specific complaints today.  REVIEW OF SYSTEMS:   Review of Systems  Constitutional: Negative.  Negative for fever, malaise/fatigue and weight loss.  HENT: Negative.  Negative for sore throat.        Patient denies dysphagia.  Respiratory: Negative.  Negative for cough, hemoptysis, shortness of breath and stridor.   Cardiovascular: Negative.  Negative for chest pain and leg swelling.  Gastrointestinal: Negative.  Negative for abdominal pain.  Genitourinary: Negative.   Musculoskeletal: Negative.   Skin: Negative.  Negative for rash.  Neurological: Negative.  Negative for sensory change and weakness.  Psychiatric/Behavioral: Negative.  The patient is not nervous/anxious.     As per HPI. Otherwise, a complete review of systems is negative.  PAST MEDICAL HISTORY: Past Medical History:  Diagnosis Date  . Anxiety    mild  . Cancer ([place]) 2015   tonsil  . COPD (chronic obstructive pulmonary disease) ([place])   . Diabetes mellitus without complication ([place])   . Elevated cholesterol   . GERD (gastroesophageal reflux disease)   . Hypertension   . Hypothyroidism   . Seizures ([place]) 2015   related to sepsis  . Stroke [facility]) 2007   no peripheral vision on left side of both  eyes & left arm gets numb under stress    PAST SURGICAL HISTORY: Past Surgical History:  Procedure Laterality Date  . ELECTROMAGNETIC NAVIGATION BROCHOSCOPY N/A 03/09/2016   Procedure: ELECTROMAGNETIC NAVIGATION BRONCHOSCOPY;  Surgeon: Flora Lipps, MD;  Location: [facility] ORS;  Service: Cardiopulmonary;  Laterality: N/A;  . TONSILLECTOMY    . TRACHEOSTOMY      FAMILY HISTORY: Reviewed and unchanged.  No reported history of malignancy or chronic disease.     ADVANCED DIRECTIVES:    HEALTH MAINTENANCE: Social History   Tobacco Use  . Smoking status: Current Every Day Smoker    Packs/day: 0.50    Years: 40.00    Pack years: 20.00  . Smokeless tobacco: Never Used  Substance Use Topics  . Alcohol use: No  . Drug use: No     Colonoscopy:  PAP:  Bone density:  Lipid panel:  No Known Allergies  Current Outpatient Medications  Medication Sig Dispense Refill  . amLODipine (NORVASC) 5 MG tablet Take 5 mg by mouth daily.    Marland Kitchen aspirin EC 81 MG tablet Take 81 mg by mouth daily.  0  . atorvastatin (LIPITOR) 80 MG tablet Take 80 mg by mouth daily after breakfast.     . Cholecalciferol 2000 UNITS CAPS Take 2,000 Units by mouth daily.     Marland Kitchen FLUoxetine (PROZAC) 10 MG capsule Take 10 mg by mouth daily.    Marland Kitchen gabapentin (NEURONTIN) 100 MG capsule Take 400 mg by mouth at bedtime.     Marland Kitchen ibuprofen (ADVIL,MOTRIN) 200 MG tablet Take 400 mg by mouth daily after breakfast. In  the morning     . levETIRAcetam (KEPPRA) 500 MG tablet Take 500 mg by mouth 2 (two) times daily.    Marland Kitchen levothyroxine (SYNTHROID, LEVOTHROID) 125 MCG tablet Take 125 mcg by mouth daily before breakfast.     . losartan (COZAAR) 100 MG tablet Take 100 mg by mouth at bedtime.    . magnesium oxide (MAG-OX) 400 MG tablet Take 400 mg by mouth 2 (two) times daily.    . Omega-3 Fatty Acids (FISH OIL) 1000 MG CAPS Take 1,000 mg by mouth daily.     Marland Kitchen omeprazole (PRILOSEC) 20 MG capsule Take 20 mg by mouth daily.    . prochlorperazine  (COMPAZINE) 10 MG tablet Take 10 mg by mouth every 6 (six) hours as needed for nausea or vomiting.    . ranitidine (ZANTAC) 150 MG tablet     . umeclidinium bromide (INCRUSE ELLIPTA) 62.5 MCG/INH AEPB Inhale 1 puff into the lungs daily. 1 each 12  . VENTOLIN HFA 108 (90 Base) MCG/ACT inhaler Inhale 1-2 puffs into the lungs every 6 (six) hours as needed (for shortness of breath/wheezing.).   1   No current facility-administered medications for this visit.     OBJECTIVE: There were no vitals filed for this visit.   There is no height or weight on file to calculate BMI.    ECOG FS:0 - Asymptomatic  General: Well-developed, well-nourished, no acute distress. Eyes: Anicteric sclera. HEENT: Normocephalic, moist mucous membranes, clear oropharnyx. No palpable lymphadenopathy, thyroid midline without nodules. Lungs: Clear to auscultation bilaterally. Heart: Regular rate and rhythm. No rubs, murmurs, or gallops. Abdomen: Soft, nontender, nondistended. No organomegaly noted, normoactive bowel sounds. Musculoskeletal: No edema, cyanosis, or clubbing. Neuro: Alert, answering all questions appropriately. Cranial nerves grossly intact. Skin: No rashes or petechiae noted. Psych: Normal affect.   LAB RESULTS:  Lab Results  Component Value Date   NA 137 10/05/2015   K 3.9 10/05/2015   CL 102 10/05/2015   CO2 27 10/05/2015   GLUCOSE 115 (H) 10/05/2015   BUN 23 (H) 10/05/2015   CREATININE 1.42 (H) 10/05/2015   CALCIUM 9.5 10/05/2015   PROT 7.4 06/18/2015   ALBUMIN 4.3 06/18/2015   AST 19 06/18/2015   ALT 21 06/18/2015   ALKPHOS 78 06/18/2015   BILITOT 0.8 06/18/2015   GFRNONAA 50 (L) 10/05/2015   GFRAA 58 (L) 10/05/2015    Lab Results  Component Value Date   WBC 9.3 03/01/2016   NEUTROABS 7.1 (H) 03/01/2016   HGB 15.8 03/01/2016   HCT 45.8 03/01/2016   MCV 96.7 03/01/2016   PLT 162 03/01/2016     STUDIES: No results found.  ASSESSMENT: 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, right upper lobe lung mass   PLAN:    1. 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 No intervention is needed at this time. No further imaging is necessary unless there is suspicion for recurrence. Patient states he no longer follows up with ENT.  Return to clinic in 6 months for routine evaluation. 2. Dysphasia: Resolved. Continue Carafate as needed. 3. Right upper lobe lung mass: CT scan results from March 16, 2017 reviewed independently and reported as above with continued mild interval enlargement of right upper lobe lung lesion. Patient underwent navigational bronchoscopy with repeat biopsy on March 09, 2016 that was negative for malignancy. No further intervention is needed at this time, but patient expressed understanding  that he would likely have to have a repeat biopsy in the future. Given patient is asymptomatic, will not prescribe antibiotics.  Return to clinic in 6 months with repeat imaging and further evaluation.   4.  Thrombocytopenia: Resolved. 5.  Elevated creatinine: Mild. Monitor.  Approximately 30 minutes was spent in discussion of which greater than 50% was consultation.  Patient expressed understanding and was in agreement with this plan. He also understands that He can call clinic at any time with any questions, concerns, or complaints.   Cancer of tonsil   Staging form: Pharynx - Oropharynx, AJCC 7th Edition     Clinical stage from 11/03/2014: Stage II (T2, N0, M0) - Signed by Lloyd Huger, MD on 11/03/2014   Lloyd Huger, MD   09/17/2017 8:24 AM

## 2017-09-18 ENCOUNTER — Ambulatory Visit: Payer: Medicare HMO

## 2017-09-21 ENCOUNTER — Inpatient Hospital Stay: Payer: Medicare HMO | Admitting: Oncology

## 2017-10-12 ENCOUNTER — Ambulatory Visit: Payer: Medicare HMO | Admitting: Internal Medicine

## 2017-10-12 ENCOUNTER — Encounter: Payer: Self-pay | Admitting: Internal Medicine

## 2017-10-12 VITALS — BP 138/86 | HR 81 | Ht 70.5 in | Wt 314.0 lb

## 2017-10-12 DIAGNOSIS — F17210 Nicotine dependence, cigarettes, uncomplicated: Secondary | ICD-10-CM | POA: Diagnosis not present

## 2017-10-12 DIAGNOSIS — J449 Chronic obstructive pulmonary disease, unspecified: Secondary | ICD-10-CM | POA: Diagnosis not present

## 2017-10-12 DIAGNOSIS — G4733 Obstructive sleep apnea (adult) (pediatric): Secondary | ICD-10-CM

## 2017-10-12 DIAGNOSIS — Z720 Tobacco use: Secondary | ICD-10-CM

## 2017-10-12 MED ORDER — UMECLIDINIUM BROMIDE 62.5 MCG/INH IN AEPB: 1.0000 | INHALATION_SPRAY | Freq: Every day | RESPIRATORY_TRACT | 12 refills | Status: DC

## 2017-10-12 NOTE — Patient Instructions (Signed)
STOP Smoking Continue inhalers as prescribed

## 2017-10-12 NOTE — Progress Notes (Signed)
[facility] Consultation      Date: 10/12/2017,   MRN# [id number] Shawn Lindsey 05-09-1950       AdmissionWeight: (!) 314 lb (142.4 kg)                 CurrentWeight: (!) 314 lb (142.4 kg) Shawn Lindsey is a 67 y.o. old male seen in consultation for RUL nodule and opacity with COPD at the request of Dr. Alyssa Grove.     CHIEF COMPLAINT:   I have abnormal CT scans Follow up excessive daytime sleepiness DX sleep apnea Follow up COPD   HISTORY OF PRESENT ILLNESS  67 yo white male seen today for h/o RUL nodular opacity previos opacity seen approx 1 year ago on CT chest Repeat CT chest on 07/02/2015 RUL cavitary lesions measures 5.0 x 2.2 x 3.6 cm.  Previously 5.4 x 2.5 x 3.5 cm.  Repeat CT chest 01/05/16 shows RUL cavitary mass 4.8x4.1 CM, previous measurement 4.6 x 3.8CM Repeat CT chest 08/2016 no significant change in size of RUL mass    Previous history entails Patient has been hospitalized approx 5  Years ago for pneumonia and resp failure requiring ICU care s/p trach Patient subsequently 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 patient sees Dr. Grayland Ormond for cancer care  Patient has had no significant weight loss, denies fevers, chills night sweats Patient has COPD and seems to be doing Ok on incruse Patient has no active wheezing today, denies CP,SOB today. Still smokes, +intermittent wheezing  PFT 12/2015   ratio 72%, FEv1 90% DLCo 57% 6MWT WNL  Office Arlyce Harman 01/16/17 Ratio 74% Fev1 92% predicted  seen today for problems with sleep Dx with OSA AHI 17 Decreased o2 sats Patient prescribed CPAP Wants nasal pillows with chin strap Compliance report is pending  Smoking Assessment and Cessation Counseling   Upon further questioning, Patient smokes 1 PPD  I have advised patient to quit/stop smoking as soon as possible due to high risk for multiple medical problems  Patient  is NOT willing to  quit smoking  I have advised patient that we can assist and have options of Nicotine replacement therapy. I also advised patient on behavioral therapy and can provide oral medication therapy in conjunction with the other therapies  Follow up next Office visit  for assessment of smoking cessation  Smoking cessation counseling advised for 5 minutes     Current Medication:  Current Outpatient Medications:  .  amLODipine (NORVASC) 5 MG tablet, Take 5 mg by mouth daily., Disp: , Rfl:  .  aspirin EC 81 MG tablet, Take 81 mg by mouth daily., Disp: , Rfl: 0 .  atorvastatin (LIPITOR) 80 MG tablet, Take 80 mg by mouth daily after breakfast. , Disp: , Rfl:  .  Cholecalciferol 2000 UNITS CAPS, Take 2,000 Units by mouth daily. , Disp: , Rfl:  .  FLUoxetine (PROZAC) 10 MG capsule, Take 10 mg by mouth daily., Disp: , Rfl:  .  gabapentin (NEURONTIN) 100 MG capsule, Take 400 mg by mouth at bedtime. , Disp: , Rfl:  .  ibuprofen (ADVIL,MOTRIN) 200 MG tablet, Take 400 mg by mouth daily after breakfast. In the morning , Disp: , Rfl:  .  levETIRAcetam (KEPPRA) 500 MG tablet, Take 500 mg by mouth 2 (two) times daily., Disp: , Rfl:  .  levothyroxine (SYNTHROID, LEVOTHROID) 125 MCG tablet, Take 125 mcg by mouth daily before breakfast. ,  Disp: , Rfl:  .  losartan (COZAAR) 100 MG tablet, Take 100 mg by mouth at bedtime., Disp: , Rfl:  .  magnesium oxide (MAG-OX) 400 MG tablet, Take 400 mg by mouth 2 (two) times daily., Disp: , Rfl:  .  Omega-3 Fatty Acids (FISH OIL) 1000 MG CAPS, Take 1,000 mg by mouth daily. , Disp: , Rfl:  .  omeprazole (PRILOSEC) 20 MG capsule, Take 20 mg by mouth daily., Disp: , Rfl:  .  prochlorperazine (COMPAZINE) 10 MG tablet, Take 10 mg by mouth every 6 (six) hours as needed for nausea or vomiting., Disp: , Rfl:  .  ranitidine (ZANTAC) 150 MG tablet, , Disp: , Rfl:  .  umeclidinium bromide (INCRUSE ELLIPTA) 62.5 MCG/INH AEPB, Inhale 1 puff into the lungs daily., Disp: 1 each, Rfl: 12 .   VENTOLIN HFA 108 (90 Base) MCG/ACT inhaler, Inhale 1-2 puffs into the lungs every 6 (six) hours as needed (for shortness of breath/wheezing.). , Disp: , Rfl: 1    ALLERGIES   Patient has no known allergies.     REVIEW OF SYSTEMS   Review of Systems  Constitutional: Negative for chills, fever and weight loss.  HENT: Negative.  Negative for congestion.   Respiratory: Negative for cough, hemoptysis, sputum production, shortness of breath and wheezing.   Cardiovascular: Negative for chest pain, palpitations and orthopnea.  Gastrointestinal: Negative for heartburn, nausea and vomiting.  Skin: Negative for rash.  Psychiatric/Behavioral: The patient is not nervous/anxious.   All other systems reviewed and are negative.   Ht 5' 10.5" (1.791 m)   Wt (!) 314 lb (142.4 kg)   BMI 44.42 kg/m      PHYSICAL EXAM  Physical Exam  Constitutional: He is oriented to person, place, and time. He appears well-developed and well-nourished. No distress.  Eyes: Pupils are equal, round, and reactive to light. EOM are normal.  Neck: Neck supple.  Cardiovascular: Normal rate, regular rhythm and normal heart sounds.  No murmur heard. Pulmonary/Chest: Effort normal and breath sounds normal. No stridor. No respiratory distress. He has no wheezes.  Musculoskeletal: Normal range of motion. He exhibits no edema.  Neurological: He is alert and oriented to person, place, and time.  Skin: Skin is warm. He is not diaphoretic.  Psychiatric: He has a normal mood and affect.     ASSESSMENT/PLAN  67 yo white male with Mild COPD Stage A with persistent RUL nodular cavitary opacity that seems to have NOT increased in size since last scan in the setting of previous head and neck tonsillar cancer with ongoing tobacco abuse with morbid obesity and deconditioned state, patient with sings and symptoms of sleep apnea DX with AHI of 17 6 months ago and started on CPAP therapy  1.COPD with reactive airways disease from  smoking Stable at this time --continue incruse -albuterol as needed -smoking cessation strongly advised-see HPI    2. RUL cavitary lesion/Right upper lobe lung mass:  will follow along with oncology CT scan results from Sep 13, 2016 reviewed independently and reported as above with mild interval enlargement of right upper lobe lung lesion. Patient underwent navigational bronchoscopy with repeat biopsy on March 09, 2016 that was negative for malignancy. No further intervention is needed at this time. Given patient is asymptomatic, we will not prescribe antibiotics.  Follow up oncology    3. 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 No intervention is needed at  this time. No further imaging is necessary unless there is suspicion for recurrence as per onolocgy  4. Dysphasia: Resolved. Continue Carafate as needed.   5. Smoking cessation strongly advised Counseling for approx 5 minutes See HPI   6.Obesity -recommend significant weight loss -recommend changing diet  7.Deconditioned state -Recommend increased daily activity and exercise  8. 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 All questions answered Follow up in 6 months  Alenah Sarria Patricia Pesa, M.D.  [facility]  Medical Director [place] Director [facility] Cardio-Pulmonary Department

## 2017-10-19 ENCOUNTER — Ambulatory Visit
Admission: RE | Admit: 2017-10-19 | Discharge: 2017-10-19 | Disposition: A | Discharge status: Home / Self Care | Payer: Medicare HMO | Source: Ambulatory Visit | Attending: Oncology | Admitting: Oncology

## 2017-10-19 DIAGNOSIS — K8020 Calculus of gallbladder without cholecystitis without obstruction: Secondary | ICD-10-CM | POA: Diagnosis not present

## 2017-10-19 DIAGNOSIS — I700 Atherosclerosis of aorta: Secondary | ICD-10-CM | POA: Insufficient documentation

## 2017-10-19 DIAGNOSIS — C099 Malignant neoplasm of tonsil, unspecified: Secondary | ICD-10-CM | POA: Diagnosis not present

## 2017-10-19 DIAGNOSIS — R918 Other nonspecific abnormal finding of lung field: Secondary | ICD-10-CM | POA: Insufficient documentation

## 2017-10-19 LAB — POCT I-STAT CREATININE: Creatinine, Ser: 1.2 mg/dL (ref 0.61–1.24)

## 2017-10-19 MED ORDER — IOPAMIDOL (ISOVUE-300) INJECTION 61%
75.0000 mL | Freq: Once | INTRAVENOUS | Status: AC | PRN
  Administered 2017-10-19: 75 mL via INTRAVENOUS

## 2017-10-23 NOTE — Progress Notes (Signed)
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  INTERVAL HISTORY: Patient returns to clinic today for routine follow-up and discussion of his imaging results.  He continues to feel well and remains asymptomatic.  He denies any fevers or recent illnesses.  He has a good appetite and denies weight loss.  He has no neurologic complaints. He denies any chest pain, cough, hemoptysis, or shortness of breath. He denies any nausea, vomiting, constipation, or diarrhea. He has no urinary complaints.  Patient feels at his baseline and offers no specific complaints today.  REVIEW OF SYSTEMS:   Review of Systems  Constitutional: Negative.  Negative for fever, malaise/fatigue and weight loss.  HENT: Negative.  Negative for sore throat.        Patient denies dysphagia.  Respiratory: Negative.  Negative for cough, hemoptysis, shortness of breath and stridor.   Cardiovascular: Negative.  Negative for chest pain and leg swelling.  Gastrointestinal: Negative.  Negative for abdominal pain.  Genitourinary: Negative.  Negative for dysuria.  Musculoskeletal: Negative.  Negative for back pain.  Skin: Negative.  Negative for rash.  Neurological: Negative.  Negative for sensory change, focal weakness and weakness.  Psychiatric/Behavioral: Negative.  The patient is not nervous/anxious.     As per HPI. Otherwise, a complete review of systems is negative.  PAST MEDICAL HISTORY: Past Medical History:  Diagnosis Date  . Anxiety    mild  . Cancer ([place]) 2015   tonsil  . COPD (chronic obstructive pulmonary disease) ([place])   . Diabetes mellitus without complication ([place])   . Elevated cholesterol   . GERD (gastroesophageal reflux disease)   .  Hypertension   . Hypothyroidism   . Seizures ([place]) 2015   related to sepsis  . Stroke [facility]) 2007   no peripheral vision on left side of both eyes & left arm gets numb under stress    PAST SURGICAL HISTORY: Past Surgical History:  Procedure Laterality Date  . ELECTROMAGNETIC NAVIGATION BROCHOSCOPY N/A 03/09/2016   Procedure: ELECTROMAGNETIC NAVIGATION BRONCHOSCOPY;  Surgeon: Flora Lipps, MD;  Location: [facility] ORS;  Service: Cardiopulmonary;  Laterality: N/A;  . TONSILLECTOMY    . TRACHEOSTOMY      FAMILY HISTORY: Reviewed and unchanged.  No reported history of malignancy or chronic disease.     ADVANCED DIRECTIVES:    HEALTH MAINTENANCE: Social History   Tobacco Use  . Smoking status: Current Every Day Smoker    Packs/day: 1.00    Years: 40.00    Pack years: 40.00  . Smokeless tobacco: Never Used  Substance Use Topics  . Alcohol use: No  . Drug use: No     Colonoscopy:  PAP:  Bone density:  Lipid panel:  No Known Allergies  Current Outpatient Medications  Medication Sig Dispense Refill  . amLODipine (NORVASC) 5 MG tablet Take 5 mg by mouth daily.    Marland Kitchen aspirin EC 81 MG tablet Take 81 mg by mouth daily.  0  . atorvastatin (LIPITOR) 80 MG tablet Take 80 mg by mouth daily after breakfast.     . Cholecalciferol 2000 UNITS CAPS Take 2,000 Units by mouth daily.     Marland Kitchen FLUoxetine (PROZAC) 10 MG capsule Take 10 mg by mouth daily.    Marland Kitchen gabapentin (NEURONTIN) 100  MG capsule Take 600 mg by mouth at bedtime.     Marland Kitchen ibuprofen (ADVIL,MOTRIN) 200 MG tablet Take 400 mg by mouth daily after breakfast. In the morning     . levETIRAcetam (KEPPRA) 500 MG tablet Take 500 mg by mouth 2 (two) times daily.    Marland Kitchen levothyroxine (SYNTHROID, LEVOTHROID) 125 MCG tablet Take 125 mcg by mouth daily before breakfast.     . losartan (COZAAR) 100 MG tablet Take 100 mg by mouth at bedtime.    . magnesium oxide (MAG-OX) 400 MG tablet Take 400 mg by mouth 2 (two) times daily.    . Omega-3 Fatty Acids  (FISH OIL) 1000 MG CAPS Take 1,000 mg by mouth daily.     Marland Kitchen omeprazole (PRILOSEC) 20 MG capsule Take 20 mg by mouth daily.    . ranitidine (ZANTAC) 150 MG tablet     . umeclidinium bromide (INCRUSE ELLIPTA) 62.5 MCG/INH AEPB Inhale 1 puff into the lungs daily. 1 each 12  . VENTOLIN HFA 108 (90 Base) MCG/ACT inhaler Inhale 1-2 puffs into the lungs every 6 (six) hours as needed (for shortness of breath/wheezing.).   1  . prochlorperazine (COMPAZINE) 10 MG tablet Take 10 mg by mouth every 6 (six) hours as needed for nausea or vomiting.     No current facility-administered medications for this visit.     OBJECTIVE: Vitals:   10/24/17 1505  BP: 109/71  Pulse: (!) 120  Resp: 18  Temp: (!) 97.4 F (36.3 C)  SpO2: 96%     Body mass index is 44.44 kg/m.    ECOG FS:0 - Asymptomatic  Ge general: Well-developed, well-nourished, no acute distress. Eyes: Pink conjunctiva, anicteric sclera. HEENT: Normocephalic, moist mucous membranes, clear oropharnyx.  No palpable lymphadenopathy. Lungs: Clear to auscultation bilaterally. Heart: Regular rate and rhythm. No rubs, murmurs, or gallops. Abdomen: Soft, nontender, nondistended. No organomegaly noted, normoactive bowel sounds. Musculoskeletal: No edema, cyanosis, or clubbing. Neuro: Alert, answering all questions appropriately. Cranial nerves grossly intact. Skin: No rashes or petechiae noted. Psych: Normal affect.  LAB RESULTS:  Lab Results  Component Value Date   NA 137 10/05/2015   K 3.9 10/05/2015   CL 102 10/05/2015   CO2 27 10/05/2015   GLUCOSE 115 (H) 10/05/2015   BUN 23 (H) 10/05/2015   CREATININE 1.20 10/19/2017   CALCIUM 9.5 10/05/2015   PROT 7.4 06/18/2015   ALBUMIN 4.3 06/18/2015   AST 19 06/18/2015   ALT 21 06/18/2015   ALKPHOS 78 06/18/2015   BILITOT 0.8 06/18/2015   GFRNONAA 50 (L) 10/05/2015   GFRAA 58 (L) 10/05/2015    Lab Results  Component Value Date   WBC 9.3 03/01/2016   NEUTROABS 7.1 (H) 03/01/2016   HGB  15.8 03/01/2016   HCT 45.8 03/01/2016   MCV 96.7 03/01/2016   PLT 162 03/01/2016     STUDIES: Ct Chest W Contrast  Result Date: 10/19/2017 CLINICAL DATA:  Lung mass on right side. EXAM: CT CHEST WITH CONTRAST TECHNIQUE: Multidetector CT imaging of the chest was performed during intravenous contrast administration. CONTRAST:  81mL ISOVUE-300 IOPAMIDOL (ISOVUE-300) INJECTION 61% COMPARISON:  03/16/2017 FINDINGS: Cardiovascular: The heart size is normal. No substantial pericardial effusion. Coronary artery calcification is evident. Atherosclerotic calcification is noted in the wall of the thoracic aorta. Mediastinum/Nodes: Scattered small mediastinal lymph nodes are similar to prior. Index 15 mm short axis precarinal lymph node measured previously is 13 mm today. 9 mm short axis left hilar lymph node identified. Nodal tissue is seen  in the right hilum. Tiny hiatal hernia. The esophagus has normal imaging features. There is no axillary lymphadenopathy. Lungs/Pleura: The central tracheobronchial airways are patent. Centrilobular paraseptal emphysema again noted. Pleuroparenchymal opacity in the right lung persists. Anterior and cranial peripheral nodular component measured previously at 2.7 x 1.4 cm now measures 3.1 x 1.4 cm. Cavitary nodular component (image 56: Series 3) measured previously at 19 x 15 mm now measures 20 x 16 mm. 2nd adjacent nodular component measured previously at 4.5 x 2.9 cm now measures 3.9 x 2.9 cm. No suspicious pulmonary nodule or mass in the left lung. Upper Abdomen: Calcified gallstone evident.  Otherwise unremarkable. Musculoskeletal: No worrisome lytic or sclerotic osseous abnormality. IMPRESSION: 1. Multinodular irregular lesion in the posterior right upper lobe is not substantially changed in the interval. This lesion is more clearly progressive when comparing back to a study from 01/05/2016. 2. Cholelithiasis 3.  Aortic Atherosclerois (ICD10-170.0) Electronically Signed   By:  Misty Stanley M.D.   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  He continues to have no evidence of disease.  CT scan results reviewed independently and report as above with no obvious evidence of recurrent or progressive disease. No intervention is needed at this time. No further imaging of his neck is necessary unless there is suspicion for recurrence. Patient states he no longer follows up with ENT.  Return to clinic in 6 months for routine evaluation.   2. Right upper lobe lung mass:  Patient underwent navigational bronchoscopy with biopsy on March 09, 2016 that was negative for malignancy.  CT scan results from October 19, 2017 reviewed independently and report as above with mild progression of lesion.  Patient continues to be asymptomatic and does not have interest in pursuing another biopsy.  No intervention is needed at this time.  Patient does not require antibiotics.  Continue to monitor with routine imaging.  Return to clinic in 6 months as above.  3.  Elevated creatinine: Patient's most recent creatinine was within normal limits.   Patient expressed understanding and was in agreement with this plan. He also understands that He can call clinic at any time with any questions, concerns, or complaints.   Cancer of tonsil   Staging form: Pharynx - Oropharynx, AJCC 7th Edition     Clinical stage from 11/03/2014: Stage II (T2, N0, M0) - Signed by Lloyd Huger, MD on 11/03/2014   Lloyd Huger, MD   10/28/2017 4:48 PM

## 2017-10-24 ENCOUNTER — Inpatient Hospital Stay: Payer: Medicare HMO | Attending: Oncology | Admitting: Oncology

## 2017-10-24 VITALS — BP 109/71 | HR 120 | Temp 97.4°F | Resp 18 | Wt 314.2 lb

## 2017-10-24 DIAGNOSIS — Z85858 Personal history of malignant neoplasm of other endocrine glands: Secondary | ICD-10-CM | POA: Insufficient documentation

## 2017-10-24 DIAGNOSIS — Z923 Personal history of irradiation: Secondary | ICD-10-CM | POA: Insufficient documentation

## 2017-10-24 DIAGNOSIS — Z9221 Personal history of antineoplastic chemotherapy: Secondary | ICD-10-CM | POA: Insufficient documentation

## 2017-10-24 DIAGNOSIS — R918 Other nonspecific abnormal finding of lung field: Secondary | ICD-10-CM

## 2017-10-24 DIAGNOSIS — R911 Solitary pulmonary nodule: Secondary | ICD-10-CM | POA: Diagnosis not present

## 2017-10-24 DIAGNOSIS — C099 Malignant neoplasm of tonsil, unspecified: Secondary | ICD-10-CM

## 2017-10-24 NOTE — Progress Notes (Signed)
Pt in for follow up, denies any concerns.

## 2018-04-15 ENCOUNTER — Encounter: Payer: Self-pay | Admitting: Emergency Medicine

## 2018-04-15 ENCOUNTER — Other Ambulatory Visit: Payer: Self-pay

## 2018-04-15 ENCOUNTER — Emergency Department: Payer: Medicare HMO

## 2018-04-15 ENCOUNTER — Emergency Department
Admission: EM | Admit: 2018-04-15 | Discharge: 2018-04-15 | Disposition: A | Discharge status: Home / Self Care | Payer: Medicare HMO | Attending: Emergency Medicine | Admitting: Emergency Medicine

## 2018-04-15 DIAGNOSIS — J449 Chronic obstructive pulmonary disease, unspecified: Secondary | ICD-10-CM | POA: Diagnosis not present

## 2018-04-15 DIAGNOSIS — Y92009 Unspecified place in unspecified non-institutional (private) residence as the place of occurrence of the external cause: Secondary | ICD-10-CM | POA: Diagnosis not present

## 2018-04-15 DIAGNOSIS — Y999 Unspecified external cause status: Secondary | ICD-10-CM | POA: Insufficient documentation

## 2018-04-15 DIAGNOSIS — I10 Essential (primary) hypertension: Secondary | ICD-10-CM | POA: Insufficient documentation

## 2018-04-15 DIAGNOSIS — W19XXXA Unspecified fall, initial encounter: Secondary | ICD-10-CM | POA: Insufficient documentation

## 2018-04-15 DIAGNOSIS — M545 Low back pain: Secondary | ICD-10-CM | POA: Diagnosis not present

## 2018-04-15 DIAGNOSIS — M5441 Lumbago with sciatica, right side: Secondary | ICD-10-CM

## 2018-04-15 DIAGNOSIS — S6991XA Unspecified injury of right wrist, hand and finger(s), initial encounter: Secondary | ICD-10-CM | POA: Diagnosis present

## 2018-04-15 DIAGNOSIS — Y9389 Activity, other specified: Secondary | ICD-10-CM | POA: Diagnosis not present

## 2018-04-15 DIAGNOSIS — Z79899 Other long term (current) drug therapy: Secondary | ICD-10-CM | POA: Insufficient documentation

## 2018-04-15 DIAGNOSIS — S63501A Unspecified sprain of right wrist, initial encounter: Secondary | ICD-10-CM | POA: Diagnosis not present

## 2018-04-15 DIAGNOSIS — Z8673 Personal history of transient ischemic attack (TIA), and cerebral infarction without residual deficits: Secondary | ICD-10-CM | POA: Diagnosis not present

## 2018-04-15 DIAGNOSIS — E119 Type 2 diabetes mellitus without complications: Secondary | ICD-10-CM | POA: Diagnosis not present

## 2018-04-15 DIAGNOSIS — F17200 Nicotine dependence, unspecified, uncomplicated: Secondary | ICD-10-CM | POA: Insufficient documentation

## 2018-04-15 MED ORDER — HYDROCODONE-ACETAMINOPHEN 5-325 MG PO TABS
1.0000 | ORAL_TABLET | Freq: Once | ORAL | Status: AC
  Administered 2018-04-15: 1 via ORAL
  Filled 2018-04-15: qty 1

## 2018-04-15 MED ORDER — HYDROCODONE-ACETAMINOPHEN 5-325 MG PO TABS: 1.0000 | ORAL_TABLET | Freq: Four times a day (QID) | ORAL | 0 refills | Status: DC | PRN

## 2018-04-15 NOTE — ED Notes (Addendum)
See triage note  Presents with lower back pain which is moving into both buttocks  Was seen last week at urgent care  But since had an additional fall  But states this am the pain became more severe  Also having some pain to right wrist

## 2018-04-15 NOTE — Discharge Instructions (Addendum)
Follow-up with your primary care provider if any continued problems.  Ice and elevate your wrist as needed for discomfort and to reduce pain.  Wear wrist splint for added protection and support.  Continue your regular medications including the steroid as prescribed by your doctor.  You may take Norco which is hydrocodone as needed for pain.  This medication could cause drowsiness and increase your risk for falling.  Follow-up with your primary care provider if any continued problems.

## 2018-04-15 NOTE — ED Notes (Signed)
velcro splint applied to right wrist  States he is having concerns about trying to use walker with pain to right wrist area  States pain is moving up to right forearm  Provider aware

## 2018-04-15 NOTE — ED Triage Notes (Signed)
Patient in no acute distress asking about wait time. Patient given update on wait time. Patient verbalizes understanding.

## 2018-04-15 NOTE — ED Provider Notes (Signed)
[facility] Emergency Department Provider Note   ____________________________________________   First MD Initiated Contact with Patient 04/15/18 [phone]     (approximate)  I have reviewed the triage vital signs and the nursing notes.   HISTORY  Chief Complaint Back Pain and Fall   HPI Shawn Lindsey is a 67 y.o. male presents to the ED via EMS with complaint of right wrist pain.  Patient states that he was seen by his PCP and diagnosed recently with sciatica.  He states that he was up during the night to go the bathroom and because of his sciatic pain failed.  He denies any head injury or loss of consciousness.  He has continued to have right wrist pain since that time.  He denies any previous injury to his wrist.  Patient currently is taking prednisone.  He states that his sciatic pain travels into both legs with the right side worse than the left.  Currently he rates pain as a 6 /10.  Past Medical History:  Diagnosis Date  . Anxiety    mild  . Cancer ([place]) 2015   tonsil  . COPD (chronic obstructive pulmonary disease) ([place])   . Diabetes mellitus without complication ([place])   . Elevated cholesterol   . GERD (gastroesophageal reflux disease)   . Hypertension   . Hypothyroidism   . Seizures ([place]) 2015   related to sepsis  . Stroke [facility]) 2007   no peripheral vision on left side of both eyes & left arm gets numb under stress    Patient Active Problem List   Diagnosis Date Noted  . Mass of upper lobe of right lung 09/29/2015  . Cancer of tonsil ([place]) 08/05/2014  . HTN (hypertension) 08/05/2014  . COPD (chronic obstructive pulmonary disease) ([place]) 08/05/2014  . Hyperlipidemia 08/05/2014    Past Surgical History:  Procedure Laterality Date  . ELECTROMAGNETIC NAVIGATION BROCHOSCOPY N/A 03/09/2016   Procedure: ELECTROMAGNETIC NAVIGATION BRONCHOSCOPY;  Surgeon: Flora Lipps, MD;  Location: [facility] ORS;  Service: Cardiopulmonary;  Laterality: N/A;  .  TONSILLECTOMY    . TRACHEOSTOMY      Prior to Admission medications   Medication Sig Start Date End Date Taking? Authorizing Provider  naproxen (NAPROSYN) 500 MG tablet Take 500 mg by mouth 2 (two) times daily with a meal.   Yes [provider]  predniSONE (STERAPRED UNI-PAK 21 TAB) 10 MG (21) TBPK tablet Take by mouth daily.   Yes [provider]  amLODipine (NORVASC) 5 MG tablet Take 5 mg by mouth daily. 02/16/16   [provider]  atorvastatin (LIPITOR) 80 MG tablet Take 80 mg by mouth daily after breakfast.     [provider]  Cholecalciferol 2000 UNITS CAPS Take 2,000 Units by mouth daily.     [provider]  FLUoxetine (PROZAC) 10 MG capsule Take 10 mg by mouth daily.    [provider]  gabapentin (NEURONTIN) 100 MG capsule Take 600 mg by mouth at bedtime.     [provider]  HYDROcodone-acetaminophen (NORCO/VICODIN) 5-325 MG tablet Take 1 tablet by mouth every 6 (six) hours as needed for moderate pain. 04/15/18   Johnn Hai, PA-C  levETIRAcetam (KEPPRA) 500 MG tablet Take 500 mg by mouth 2 (two) times daily.    [provider]  levothyroxine (SYNTHROID, LEVOTHROID) 125 MCG tablet Take 125 mcg by mouth daily before breakfast.     [provider]  losartan (COZAAR) 100 MG tablet Take 100 mg by mouth  at bedtime.    [provider]  magnesium oxide (MAG-OX) 400 MG tablet Take 400 mg by mouth 2 (two) times daily.    [provider]  Omega-3 Fatty Acids (FISH OIL) 1000 MG CAPS Take 1,000 mg by mouth daily.     [provider]  omeprazole (PRILOSEC) 20 MG capsule Take 20 mg by mouth daily.    [provider]  ranitidine (ZANTAC) 150 MG tablet  03/07/16   [provider]  umeclidinium bromide (INCRUSE ELLIPTA) 62.5 MCG/INH AEPB Inhale 1 puff into the lungs daily. 10/12/17   Flora Lipps, MD  VENTOLIN HFA 108 (90 Base) MCG/ACT inhaler Inhale 1-2 puffs into the  lungs every 6 (six) hours as needed (for shortness of breath/wheezing.).  06/25/15   [provider]    Allergies Patient has no known allergies.  Family History  Problem Relation Age of Onset  . Diabetes Brother   . Dementia Mother     Social History Social History   Tobacco Use  . Smoking status: Current Every Day Smoker    Packs/day: 1.00    Years: 40.00    Pack years: 40.00  . Smokeless tobacco: Never Used  Substance Use Topics  . Alcohol use: No  . Drug use: No    Review of Systems Constitutional: No fever/chills Eyes: No visual changes. ENT: No trauma. Cardiovascular: Denies chest pain. Respiratory: Denies shortness of breath. Gastrointestinal: No abdominal pain.  No nausea, no vomiting.  No diarrhea.  No constipation. Genitourinary: Negative for dysuria. Musculoskeletal: Positive low back pain with sciatica.  Positive right wrist pain. Skin: Negative for rash. Neurological: Negative for headaches, focal weakness or numbness. ____________________________________________   PHYSICAL EXAM:  VITAL SIGNS: ED Triage Vitals  Enc Vitals Group     BP 04/15/18 0518 (!) 143/81     Pulse Rate 04/15/18 0518 64     Resp 04/15/18 0518 20     Temp 04/15/18 0518 97.8 F (36.6 C)     Temp Source 04/15/18 0518 Oral     SpO2 04/15/18 0518 96 %     Weight 04/15/18 0515 (!) 317 lb (143.8 kg)     Height 04/15/18 0515 5\' 10"  (1.778 m)     Head Circumference --      Peak Flow --      Pain Score 04/15/18 0515 6     Pain Loc --      Pain Edu? --      Excl. in [place]? --    Constitutional: Alert and oriented. Well appearing and in no acute distress. Eyes: Conjunctivae are normal. PERRL. EOMI. Head: Atraumatic. Nose: No trauma. Neck: No stridor.  No cervical tenderness on palpation posteriorly. Cardiovascular: Normal rate, regular rhythm. Grossly normal heart sounds.  Good peripheral circulation. Respiratory: Normal respiratory effort.  No retractions. Lungs  CTAB. Musculoskeletal: Examination of the back there is no gross deformity and there is minimal tenderness on palpation of the cervical or thoracic spine.  There is some tenderness noted on palpation of the lower lumbar spine with SI joint tenderness.  Right is more tender than the left on palpation.  Examination of the right wrist there is no gross deformity but moderately tender to palpation.  There is some minimal soft tissue swelling present.  No ecchymosis or abrasions were seen.  Pulses present.  Motor sensory function intact.  Range of motion is restricted secondary to discomfort. Neurologic:  Normal speech and language. No gross focal neurologic deficits are appreciated. Skin:  Skin is warm, dry and intact.  Psychiatric: Mood and affect are normal. Speech and behavior are normal.  ____________________________________________   LABS (all labs ordered are listed, but only abnormal results are displayed)  Labs Reviewed - No data to display  RADIOLOGY  Official radiology report(s): Dg Wrist Complete Right  Result Date: 04/15/2018 CLINICAL DATA:  Pain after fall this morning. EXAM: RIGHT WRIST - COMPLETE 3+ VIEW COMPARISON:  None. FINDINGS: There is no evidence of fracture or dislocation. There is no evidence of arthropathy or other focal bone abnormality. Soft tissues are unremarkable. IMPRESSION: Negative. Electronically Signed   By: Dorise Bullion III M.D   On: 04/15/2018 07:50    ____________________________________________   PROCEDURES  Procedure(s) performed:   .Splint Application Date/Time: 40/12/6759 4:33 PM Performed by: Archie Endo, NT Authorized by: Johnn Hai, PA-C   Consent:    Consent obtained:  Verbal   Consent given by:  Patient   Risks discussed:  Pain and swelling Pre-procedure details:    Sensation:  Normal Procedure details:    Laterality:  Right   Location:  Wrist   Wrist:  R wrist   Strapping: yes     Cast type:  Short arm   Supplies:   Prefabricated splint Post-procedure details:    Pain:  Improved   Sensation:  Normal   Patient tolerance of procedure:  Tolerated well, no immediate complications    Critical Care performed: No  ____________________________________________   INITIAL IMPRESSION / ASSESSMENT AND PLAN / ED COURSE  As part of my medical decision making, I reviewed the following data within the electronic MEDICAL RECORD NUMBER Notes from prior ED visits and [place] Controlled Substance Database  Patient presents to the ED via EMS after falling during the night secondary to his sciatic pain.  Patient complains of right wrist pain since his fall.  He denies any head injury or loss of consciousness.  Patient currently is taking prednisone for his pain and sciatica.  He states that this does not help with the pain.  Physical exam was positive for wrist pain and decreased range of motion secondary to his pain.  X-rays were reassuring and patient was made aware that there was no fracture.  Patient was given hydrocodone while in the ED for his pain.  At the time of discharge patient states that he cannot manage his walker due to his wrist injury.  EMS was then called after patient voiced that he understood that he would be responsible for the bill for EMS to return him home.  Patient was ambulatory to the stretcher for EMS.  He is encouraged to follow-up with his PCP if any continued problems.  ____________________________________________   FINAL CLINICAL IMPRESSION(S) / ED DIAGNOSES  Final diagnoses:  Acute right-sided low back pain with right-sided sciatica  Wrist sprain, right, initial encounter  Fall in home, initial encounter     ED Discharge Orders         Ordered    HYDROcodone-acetaminophen (NORCO/VICODIN) 5-325 MG tablet  Every 6 hours PRN     04/15/18 0817           Note:  This document was prepared using Dragon voice recognition software and may include unintentional dictation errors.    Johnn Hai, PA-C 04/15/18 [place], [place], MD 04/19/18 [phone]

## 2018-04-15 NOTE — ED Triage Notes (Addendum)
Patient to triage via wheelchair by EMS.  EMS reports patient fell approximately an hour prior to arrival.  Patient has skin tear on right buttock.  Reports back pain.  Patient with history of sciatica.  EMS vital signs:  hr 73, BP 152/94, pulse oxi on room air 93%, 12- lead showing normal sinus, cbg 125.  Patient reports several falls lately due to pain.

## 2018-04-15 NOTE — Progress Notes (Deleted)
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  INTERVAL HISTORY: Patient returns to clinic today for routine follow-up and discussion of his imaging results.  He continues to feel well and remains asymptomatic.  He denies any fevers or recent illnesses.  He has a good appetite and denies weight loss.  He has no neurologic complaints. He denies any chest pain, cough, hemoptysis, or shortness of breath. He denies any nausea, vomiting, constipation, or diarrhea. He has no urinary complaints.  Patient feels at his baseline and offers no specific complaints today.  REVIEW OF SYSTEMS:   Review of Systems  Constitutional: Negative.  Negative for fever, malaise/fatigue and weight loss.  HENT: Negative.  Negative for sore throat.        Patient denies dysphagia.  Respiratory: Negative.  Negative for cough, hemoptysis, shortness of breath and stridor.   Cardiovascular: Negative.  Negative for chest pain and leg swelling.  Gastrointestinal: Negative.  Negative for abdominal pain.  Genitourinary: Negative.  Negative for dysuria.  Musculoskeletal: Negative.  Negative for back pain.  Skin: Negative.  Negative for rash.  Neurological: Negative.  Negative for sensory change, focal weakness and weakness.  Psychiatric/Behavioral: Negative.  The patient is not nervous/anxious.     As per HPI. Otherwise, a complete review of systems is negative.  PAST MEDICAL HISTORY: Past Medical History:  Diagnosis Date  . Anxiety    mild  . Cancer ([place]) 2015   tonsil  . COPD (chronic obstructive pulmonary disease) ([place])   . Diabetes mellitus without complication ([place])   . Elevated cholesterol   . GERD (gastroesophageal reflux disease)   .  Hypertension   . Hypothyroidism   . Seizures ([place]) 2015   related to sepsis  . Stroke [facility]) 2007   no peripheral vision on left side of both eyes & left arm gets numb under stress    PAST SURGICAL HISTORY: Past Surgical History:  Procedure Laterality Date  . ELECTROMAGNETIC NAVIGATION BROCHOSCOPY N/A 03/09/2016   Procedure: ELECTROMAGNETIC NAVIGATION BRONCHOSCOPY;  Surgeon: Flora Lipps, MD;  Location: [facility] ORS;  Service: Cardiopulmonary;  Laterality: N/A;  . TONSILLECTOMY    . TRACHEOSTOMY      FAMILY HISTORY: Reviewed and unchanged.  No reported history of malignancy or chronic disease.     ADVANCED DIRECTIVES:    HEALTH MAINTENANCE: Social History   Tobacco Use  . Smoking status: Current Every Day Smoker    Packs/day: 1.00    Years: 40.00    Pack years: 40.00  . Smokeless tobacco: Never Used  Substance Use Topics  . Alcohol use: No  . Drug use: No     Colonoscopy:  PAP:  Bone density:  Lipid panel:  No Known Allergies  Current Outpatient Medications  Medication Sig Dispense Refill  . amLODipine (NORVASC) 5 MG tablet Take 5 mg by mouth daily.    Marland Kitchen aspirin EC 81 MG tablet Take 81 mg by mouth daily.  0  . atorvastatin (LIPITOR) 80 MG tablet Take 80 mg by mouth daily after breakfast.     . Cholecalciferol 2000 UNITS CAPS Take 2,000 Units by mouth daily.     Marland Kitchen FLUoxetine (PROZAC) 10 MG capsule Take 10 mg by mouth daily.    Marland Kitchen gabapentin (NEURONTIN) 100  MG capsule Take 600 mg by mouth at bedtime.     Marland Kitchen ibuprofen (ADVIL,MOTRIN) 200 MG tablet Take 400 mg by mouth daily after breakfast. In the morning     . levETIRAcetam (KEPPRA) 500 MG tablet Take 500 mg by mouth 2 (two) times daily.    Marland Kitchen levothyroxine (SYNTHROID, LEVOTHROID) 125 MCG tablet Take 125 mcg by mouth daily before breakfast.     . losartan (COZAAR) 100 MG tablet Take 100 mg by mouth at bedtime.    . magnesium oxide (MAG-OX) 400 MG tablet Take 400 mg by mouth 2 (two) times daily.    . Omega-3 Fatty Acids  (FISH OIL) 1000 MG CAPS Take 1,000 mg by mouth daily.     Marland Kitchen omeprazole (PRILOSEC) 20 MG capsule Take 20 mg by mouth daily.    . prochlorperazine (COMPAZINE) 10 MG tablet Take 10 mg by mouth every 6 (six) hours as needed for nausea or vomiting.    . ranitidine (ZANTAC) 150 MG tablet     . umeclidinium bromide (INCRUSE ELLIPTA) 62.5 MCG/INH AEPB Inhale 1 puff into the lungs daily. 1 each 12  . VENTOLIN HFA 108 (90 Base) MCG/ACT inhaler Inhale 1-2 puffs into the lungs every 6 (six) hours as needed (for shortness of breath/wheezing.).   1   No current facility-administered medications for this visit.     OBJECTIVE: There were no vitals filed for this visit.   There is no height or weight on file to calculate BMI.    ECOG FS:0 - Asymptomatic  Ge general: Well-developed, well-nourished, no acute distress. Eyes: Pink conjunctiva, anicteric sclera. HEENT: Normocephalic, moist mucous membranes, clear oropharnyx.  No palpable lymphadenopathy. Lungs: Clear to auscultation bilaterally. Heart: Regular rate and rhythm. No rubs, murmurs, or gallops. Abdomen: Soft, nontender, nondistended. No organomegaly noted, normoactive bowel sounds. Musculoskeletal: No edema, cyanosis, or clubbing. Neuro: Alert, answering all questions appropriately. Cranial nerves grossly intact. Skin: No rashes or petechiae noted. Psych: Normal affect.  LAB RESULTS:  Lab Results  Component Value Date   NA 137 10/05/2015   K 3.9 10/05/2015   CL 102 10/05/2015   CO2 27 10/05/2015   GLUCOSE 115 (H) 10/05/2015   BUN 23 (H) 10/05/2015   CREATININE 1.20 10/19/2017   CALCIUM 9.5 10/05/2015   PROT 7.4 06/18/2015   ALBUMIN 4.3 06/18/2015   AST 19 06/18/2015   ALT 21 06/18/2015   ALKPHOS 78 06/18/2015   BILITOT 0.8 06/18/2015   GFRNONAA 50 (L) 10/05/2015   GFRAA 58 (L) 10/05/2015    Lab Results  Component Value Date   WBC 9.3 03/01/2016   NEUTROABS 7.1 (H) 03/01/2016   HGB 15.8 03/01/2016   HCT 45.8 03/01/2016   MCV  96.7 03/01/2016   PLT 162 03/01/2016     STUDIES: Dg Wrist Complete Right  Result Date: 04/15/2018 CLINICAL DATA:  Pain after fall this morning. EXAM: RIGHT WRIST - COMPLETE 3+ VIEW COMPARISON:  None. FINDINGS: There is no evidence of fracture or dislocation. There is no evidence of arthropathy or other focal bone abnormality. Soft tissues are unremarkable. IMPRESSION: Negative. 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  He continues to have no evidence of disease.  CT scan results reviewed independently and report as above with no obvious evidence of recurrent or progressive disease. No intervention is needed at this time. No further imaging of his neck is necessary unless there is suspicion for recurrence. Patient states he no longer follows up with ENT.  Return to clinic in 6 months for routine evaluation.   2. Right upper lobe lung mass:  Patient underwent navigational bronchoscopy with biopsy on March 09, 2016 that was negative for malignancy.  CT scan results from October 19, 2017 reviewed independently and report as above with mild progression of lesion.  Patient continues to be asymptomatic and does not have interest in pursuing another biopsy.  No intervention is needed at this time.  Patient does not require antibiotics.  Continue to monitor with routine imaging.  Return to clinic in 6 months as above.  3.  Elevated creatinine: Patient's most recent creatinine was within normal limits.   Patient expressed understanding and was in agreement with this plan. He also understands that He can call clinic at any time with any questions, concerns, or complaints.   Cancer of tonsil   Staging form: Pharynx -  Oropharynx, AJCC 7th Edition     Clinical stage from 11/03/2014: Stage II (T2, N0, M0) - Signed by Lloyd Huger, MD on 11/03/2014   Lloyd Huger, MD   04/15/2018 8:03 AM

## 2018-04-18 ENCOUNTER — Ambulatory Visit: Admission: RE | Admit: 2018-04-18 | Payer: Medicare HMO | Source: Ambulatory Visit

## 2018-04-19 ENCOUNTER — Telehealth: Payer: Self-pay

## 2018-04-19 ENCOUNTER — Inpatient Hospital Stay: Payer: Medicare HMO | Admitting: Oncology

## 2018-04-19 NOTE — Telephone Encounter (Signed)
Called patient to let him know that we will cancel his appointment for today since he did not have his CT Scan done. Patient stated that he was recently in the hospital and therefore, he was not able to come in today. Patient state dthat that he would give [place] a call back once he felt better.

## 2018-05-04 ENCOUNTER — Other Ambulatory Visit: Payer: Self-pay | Admitting: Orthopedic Surgery

## 2018-05-04 DIAGNOSIS — M5442 Lumbago with sciatica, left side: Principal | ICD-10-CM

## 2018-05-04 DIAGNOSIS — M5441 Lumbago with sciatica, right side: Secondary | ICD-10-CM

## 2018-05-08 ENCOUNTER — Ambulatory Visit
Admission: RE | Admit: 2018-05-08 | Discharge: 2018-05-08 | Disposition: A | Discharge status: Home / Self Care | Payer: Medicare HMO | Source: Ambulatory Visit | Attending: Orthopedic Surgery | Admitting: Orthopedic Surgery

## 2018-05-08 ENCOUNTER — Ambulatory Visit: Admission: RE | Admit: 2018-05-08 | Payer: Medicare HMO | Source: Ambulatory Visit

## 2018-05-08 DIAGNOSIS — M5442 Lumbago with sciatica, left side: Secondary | ICD-10-CM | POA: Diagnosis present

## 2018-05-08 DIAGNOSIS — M5441 Lumbago with sciatica, right side: Secondary | ICD-10-CM | POA: Diagnosis present

## 2018-11-06 ENCOUNTER — Other Ambulatory Visit: Payer: Self-pay | Admitting: Internal Medicine

## 2018-12-03 ENCOUNTER — Other Ambulatory Visit: Payer: Self-pay | Admitting: Internal Medicine

## 2018-12-03 NOTE — Telephone Encounter (Signed)
Received Rx request from [facility] for incruse.  Pt last seen 09/2017 and was instructed to f/u in 78mo.  Rx has been denied, as appointment is needed.

## 2018-12-07 ENCOUNTER — Telehealth: Payer: Self-pay | Admitting: Internal Medicine

## 2018-12-07 MED ORDER — INCRUSE ELLIPTA 62.5 MCG/INH IN AEPB: 1.0000 | INHALATION_SPRAY | Freq: Every day | RESPIRATORY_TRACT | 0 refills | Status: DC

## 2018-12-07 NOTE — Telephone Encounter (Signed)
Rx sent x 1 only  Spoke with the pt and advised keep appt for refills  He verbalized understanding

## 2018-12-07 NOTE — Telephone Encounter (Signed)
Pt requesting refills for rx INCRUSE. Based on notes he is required to schedule appt. before any future refills. Pt called in and scheduled for 12/14/2018 (next available) and he is on his last dose and wants to know if a refill can be sent in sooner. Pharmacy- [facility] [place]

## 2018-12-14 ENCOUNTER — Ambulatory Visit (INDEPENDENT_AMBULATORY_CARE_PROVIDER_SITE_OTHER): Payer: Medicare HMO | Admitting: Internal Medicine

## 2018-12-14 ENCOUNTER — Encounter: Payer: Self-pay | Admitting: Internal Medicine

## 2018-12-14 ENCOUNTER — Other Ambulatory Visit: Payer: Self-pay

## 2018-12-14 DIAGNOSIS — C3490 Malignant neoplasm of unspecified part of unspecified bronchus or lung: Secondary | ICD-10-CM | POA: Diagnosis not present

## 2018-12-14 DIAGNOSIS — F17210 Nicotine dependence, cigarettes, uncomplicated: Secondary | ICD-10-CM | POA: Diagnosis not present

## 2018-12-14 DIAGNOSIS — J449 Chronic obstructive pulmonary disease, unspecified: Secondary | ICD-10-CM | POA: Diagnosis not present

## 2018-12-14 MED ORDER — VENTOLIN HFA 108 (90 BASE) MCG/ACT IN AERS: 1.0000 | INHALATION_SPRAY | Freq: Four times a day (QID) | RESPIRATORY_TRACT | 6 refills | Status: AC | PRN

## 2018-12-14 MED ORDER — INCRUSE ELLIPTA 62.5 MCG/INH IN AEPB: 1.0000 | INHALATION_SPRAY | Freq: Every day | RESPIRATORY_TRACT | 0 refills | Status: DC

## 2018-12-14 NOTE — Progress Notes (Signed)
[facility] Consultation     I connected with the patient by telephone enabled telemedicine visit and verified that I am speaking with the correct person using two identifiers.    I discussed the limitations, risks, security and privacy concerns of performing an evaluation and management service by telemedicine and the availability of in-person appointments. I also discussed with the patient that there may be a patient responsible charge related to this service. The patient expressed understanding and agreed to proceed.  PATIENT AGREES AND CONFIRMS -YES   Other persons participating in the visit and their role in the encounter: Patient, nursing   Patient's location: Home Provider's location: Clinic   I discussed the limitations, risks, security and privacy concerns of performing an evaluation and management service by telephone and the availability of in person appointments. I also discussed with the patient that there may be a patient responsible charge related to this service. The patient expressed understanding and agreed to proceed.  This visit type was conducted due to national recommendations for restrictions regarding the COVID-19 Pandemic (e.g. social distancing).  This format is felt to be most appropriate for this patient at this time.  All issues noted in this document were discussed and addressed.        Date: 12/14/2018,   MRN# [id number] Shawn Lindsey 04-11-1951       Admission                  Current  Shawn Lindsey is a 68 y.o. old male seen in consultation for RUL nodule and opacity with COPD at the request of Dr. Alyssa Grove.   SYNOPSIS Repeat CT chest on 07/02/2015 RUL cavitary lesions measures 5.0 x 2.2 x 3.6 cm.  Previously 5.4 x 2.5 x 3.5 cm.  Repeat CT chest 01/05/16 shows RUL cavitary mass 4.8x4.1 CM, previous measurement 4.6 x 3.8CM Repeat CT chest 08/2016 no significant change in size of RUL mass  PFT 12/2015   ratio 72%, FEv1 90% DLCo  57% 6MWT WNL  Office Shawn Lindsey 01/16/17 Ratio 74% Fev1 92% predicted  Previous history entails Patient has been hospitalized approx 5  Years ago for pneumonia and resp failure requiring ICU care s/p trach Patient subsequently 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 patient sees Dr. Grayland Ormond for cancer care SYNOPSIS  CHIEF COMPLAINT:   Follow up up COPD Follow up lung cancer  HISTORY OF PRESENT ILLNESS   No significant weight loss Denies fevers No chills no night sweats  COPD seems to be stable at this time despite ongoing smoking He is doing well with Incruse and albuterol as needed  Still has some intermittent wheezing No signs of infection No evidence of COPD exacerbation at this time  Patient has not had a recent CT chest due to his underlying malignancy Will obtain CT chest for interval changes and assessment   Patient also has a diagnosis of sleep apnea on CPAP machine however he does not have a chip Compliance report not available at this time   Patient diagnosed with severe sleep apnea AHI of 17 Patient states he is doing well on his nasal pillows with CPAP    Smoking Assessment and Cessation Counseling   Upon further questioning, Patient smokes 1/2 ppd  I have advised patient to quit/stop smoking as soon as possible due to high risk for multiple medical problems  Patient is NOT willing to quit smoking  I have advised patient that we can assist and have options of Nicotine replacement therapy. I also advised patient on behavioral therapy and can provide oral medication therapy in conjunction with the other therapies  Follow up next Office visit  for assessment of smoking cessation  Smoking cessation counseling advised for 4 minutes       Current Medication:  Current Outpatient Medications:  .  amLODipine (NORVASC) 5 MG tablet, Take 5 mg by mouth daily., Disp: , Rfl:  .  atorvastatin (LIPITOR) 80  MG tablet, Take 80 mg by mouth daily after breakfast. , Disp: , Rfl:  .  Cholecalciferol 2000 UNITS CAPS, Take 2,000 Units by mouth daily. , Disp: , Rfl:  .  FLUoxetine (PROZAC) 10 MG capsule, Take 10 mg by mouth daily., Disp: , Rfl:  .  gabapentin (NEURONTIN) 100 MG capsule, Take 600 mg by mouth at bedtime. , Disp: , Rfl:  .  HYDROcodone-acetaminophen (NORCO/VICODIN) 5-325 MG tablet, Take 1 tablet by mouth every 6 (six) hours as needed for moderate pain., Disp: 20 tablet, Rfl: 0 .  levETIRAcetam (KEPPRA) 500 MG tablet, Take 500 mg by mouth 2 (two) times daily., Disp: , Rfl:  .  levothyroxine (SYNTHROID, LEVOTHROID) 125 MCG tablet, Take 125 mcg by mouth daily before breakfast. , Disp: , Rfl:  .  losartan (COZAAR) 100 MG tablet, Take 100 mg by mouth at bedtime., Disp: , Rfl:  .  magnesium oxide (MAG-OX) 400 MG tablet, Take 400 mg by mouth 2 (two) times daily., Disp: , Rfl:  .  naproxen (NAPROSYN) 500 MG tablet, Take 500 mg by mouth 2 (two) times daily with a meal., Disp: , Rfl:  .  Omega-3 Fatty Acids (FISH OIL) 1000 MG CAPS, Take 1,000 mg by mouth daily. , Disp: , Rfl:  .  omeprazole (PRILOSEC) 20 MG capsule, Take 20 mg by mouth daily., Disp: , Rfl:  .  predniSONE (STERAPRED UNI-PAK 21 TAB) 10 MG (21) TBPK tablet, Take by mouth daily., Disp: , Rfl:  .  ranitidine (ZANTAC) 150 MG tablet, , Disp: , Rfl:  .  umeclidinium bromide (INCRUSE ELLIPTA) 62.5 MCG/INH AEPB, Inhale 1 puff into the lungs daily., Disp: 30 each, Rfl: 0 .  VENTOLIN HFA 108 (90 Base) MCG/ACT inhaler, Inhale 1-2 puffs into the lungs every 6 (six) hours as needed (for shortness of breath/wheezing.). , Disp: , Rfl: 1    ALLERGIES   Patient has no known allergies.     REVIEW OF SYSTEMS   Review of Systems  Constitutional: Negative for chills, fever and weight loss.  HENT: Negative.  Negative for congestion.   Respiratory: Negative for cough, hemoptysis, sputum production, shortness of breath and wheezing.   Cardiovascular:  Negative for chest pain, palpitations and orthopnea.  Gastrointestinal: Negative for heartburn, nausea and vomiting.  Skin: Negative for rash.  Psychiatric/Behavioral: The patient is not nervous/anxious.   All other systems reviewed and are negative.    ASSESSMENT/PLAN   68 year old white male with mild COPD Gold stage III with persistent right upper lobe nodular cavitary opacification that seems to have not increased in size based on the previous CT scans in the setting of previous head and neck tonsillar cancer with ongoing tobacco abuse with morbid obesity and deconditioned state with sleep apnea  COPD Gold stage a Intermittent reactive airways disease from smoking Seems to be stable at this time Continue Incruse as prescribed Albuterol as needed    Smoking cessation strongly advised   Right upper lobe cavitary lesion  right upper lobe lung mass Will obtain CT of his chest Patient went navigational bronchoscopy in March 09, 2016 and was negative for malignancy Patient had subsequent CAT scans the following year that shows no significant changes in the right upper lobe cavitary lesion However patient needs repeat CT chest at this time for interval assessment  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 Previous CT scan shows no obvious recurrence  Obesity -recommend significant weight loss -recommend changing diet  Deconditioned state -Recommend increased daily activity and exercise   OSA Continue CPAP as prescribed Patient will need to be plugged into the system in order for me to obtain a compliance report But patient uses a benefits from CPAP therapy  Chronic hypoxic respiratory failure from COPD Patient uses oxygen infusing to his CPAP machine Patient uses and benefits from oxygen therapy He needs this for survival    COVID-19 EDUCATION: The signs and symptoms of COVID-19 were discussed with the patient and how to seek care for testing.  The  importance of social distancing was discussed today. Hand Washing Techniques and avoid touching face was advised.  MEDICATION ADJUSTMENTS/LABS AND TESTS ORDERED: Continue Incruse Continue albuterol Continue oxygen Obtain CT of the chest    CURRENT MEDICATIONS REVIEWED AT LENGTH WITH PATIENT TODAY   Patient satisfied with Plan of action and management. All questions answered  Follow up in 6 months  Time spent 25 minutes  Tenea Sens Patricia Pesa, M.D.  [facility]  Medical Director [place] Director [facility] Cardio-Pulmonary Department

## 2018-12-14 NOTE — Patient Instructions (Signed)
Continue with inhaler therapy as prescribed  obtain CT chest

## 2018-12-24 ENCOUNTER — Ambulatory Visit
Admission: RE | Admit: 2018-12-24 | Discharge: 2018-12-24 | Disposition: A | Discharge status: Home / Self Care | Payer: Medicare HMO | Source: Ambulatory Visit | Attending: Internal Medicine | Admitting: Internal Medicine

## 2018-12-24 DIAGNOSIS — C3490 Malignant neoplasm of unspecified part of unspecified bronchus or lung: Secondary | ICD-10-CM

## 2018-12-27 ENCOUNTER — Telehealth: Payer: Self-pay | Admitting: Internal Medicine

## 2018-12-27 NOTE — Telephone Encounter (Signed)
Pt calling to get results of a CT @ [phone].Shawn Lindsey

## 2018-12-27 NOTE — Telephone Encounter (Signed)
Called and spoke to pt, who is requesting CT results. Pt is not scheduled for OV until 06/2019  DK please advise on results. Thanks

## 2018-12-27 NOTE — Telephone Encounter (Signed)
I will call and discuss results with patent

## 2018-12-27 NOTE — Telephone Encounter (Signed)
It appears that Dr. Mortimer Fries had attempted to contact pt.   DK please advise. Thanks

## 2018-12-28 NOTE — Telephone Encounter (Signed)
ATC pt- unable to leave voicemail, due to mailbox not being setup. Will call back

## 2018-12-28 NOTE — Telephone Encounter (Signed)
Patient is returning phone call.  Patient phone number is [phone].

## 2018-12-31 ENCOUNTER — Other Ambulatory Visit: Payer: Self-pay | Admitting: Oncology

## 2018-12-31 NOTE — Telephone Encounter (Signed)
Noted  

## 2018-12-31 NOTE — Telephone Encounter (Signed)
Pt called requesting update on results.  DK please advise. Thanks

## 2018-12-31 NOTE — Telephone Encounter (Signed)
Patient called and updated will review his case with cancer center and touch base with patient again later this week.

## 2019-01-03 ENCOUNTER — Other Ambulatory Visit: Payer: Medicare HMO

## 2019-01-03 NOTE — Progress Notes (Signed)
Tumor Board Documentation  Shawn Lindsey was presented by Dr Grayland Ormond at our Tumor Board on 01/03/2019, which included representatives from medical oncology, radiation oncology, pathology, radiology, surgical, navigation, internal medicine, pharmacy, pulmonology, palliative care, research.  Shawn Lindsey currently presents as a current patient, for discussion with history of the following treatments: neoadjuvant chemoradiation.  Additionally, we reviewed previous medical and familial history, history of present illness, and recent lab results along with all available histopathologic and imaging studies. The tumor board considered available treatment options and made the following recommendations: Additional screening, Biopsy of lung mass PET Scan, Brochoscopic biopsy  The following procedures/referrals were also placed: No orders of the defined types were placed in this encounter.   Clinical Trial Status: not discussed   Staging used:    National site-specific guidelines   were discussed with respect to the case.  Tumor board is a meeting of clinicians from various specialty areas who evaluate and discuss patients for whom a multidisciplinary approach is being considered. Final determinations in the plan of care are those of the provider(s). The responsibility for follow up of recommendations given during tumor board is that of the provider.   Today's extended care, comprehensive team conference, Shawn Lindsey was not present for the discussion and was not examined.   Multidisciplinary Tumor Board is a multidisciplinary case peer review process.  Decisions discussed in the Multidisciplinary Tumor Board reflect the opinions of the specialists present at the conference without having examined the patient.  Ultimately, treatment and diagnostic decisions rest with the primary provider(s) and the patient.

## 2019-01-31 ENCOUNTER — Other Ambulatory Visit: Payer: Self-pay | Admitting: Internal Medicine

## 2019-01-31 DIAGNOSIS — J449 Chronic obstructive pulmonary disease, unspecified: Secondary | ICD-10-CM

## 2019-01-31 MED ORDER — INCRUSE ELLIPTA 62.5 MCG/INH IN AEPB: 1.0000 | INHALATION_SPRAY | Freq: Every day | RESPIRATORY_TRACT | 1 refills | Status: DC

## 2019-04-01 ENCOUNTER — Other Ambulatory Visit: Payer: Self-pay

## 2019-04-01 DIAGNOSIS — J449 Chronic obstructive pulmonary disease, unspecified: Secondary | ICD-10-CM

## 2019-04-01 MED ORDER — INCRUSE ELLIPTA 62.5 MCG/INH IN AEPB: 1.0000 | INHALATION_SPRAY | Freq: Every day | RESPIRATORY_TRACT | 1 refills | Status: DC

## 2019-05-31 ENCOUNTER — Other Ambulatory Visit: Payer: Self-pay

## 2019-05-31 DIAGNOSIS — J449 Chronic obstructive pulmonary disease, unspecified: Secondary | ICD-10-CM

## 2019-05-31 MED ORDER — INCRUSE ELLIPTA 62.5 MCG/INH IN AEPB: 1.0000 | INHALATION_SPRAY | Freq: Every day | RESPIRATORY_TRACT | 1 refills | Status: DC

## 2019-05-31 NOTE — Telephone Encounter (Signed)
Rx for incruse has been sent to preferred pharmacy, as requested by [facility] Nothing further is needed.

## 2019-07-05 ENCOUNTER — Inpatient Hospital Stay: Payer: Medicare HMO

## 2019-07-05 ENCOUNTER — Inpatient Hospital Stay: Payer: Medicare HMO | Admitting: Certified Registered"

## 2019-07-05 ENCOUNTER — Other Ambulatory Visit: Payer: Self-pay

## 2019-07-05 ENCOUNTER — Encounter
Admission: EM | Disposition: A | Discharge status: Skilled Nursing Facility | Payer: Self-pay | Source: Home / Self Care | Attending: Internal Medicine

## 2019-07-05 ENCOUNTER — Inpatient Hospital Stay
Admission: EM | Admit: 2019-07-05 | Discharge: 2019-07-12 | DRG: 853 | Disposition: A | Discharge status: Skilled Nursing Facility | Payer: Medicare HMO | Attending: Internal Medicine | Admitting: Internal Medicine

## 2019-07-05 ENCOUNTER — Emergency Department: Payer: Medicare HMO

## 2019-07-05 ENCOUNTER — Encounter: Payer: Self-pay | Admitting: Emergency Medicine

## 2019-07-05 DIAGNOSIS — Z452 Encounter for adjustment and management of vascular access device: Secondary | ICD-10-CM

## 2019-07-05 DIAGNOSIS — R5381 Other malaise: Secondary | ICD-10-CM | POA: Diagnosis present

## 2019-07-05 DIAGNOSIS — Z7982 Long term (current) use of aspirin: Secondary | ICD-10-CM

## 2019-07-05 DIAGNOSIS — F419 Anxiety disorder, unspecified: Secondary | ICD-10-CM | POA: Diagnosis present

## 2019-07-05 DIAGNOSIS — Z8673 Personal history of transient ischemic attack (TIA), and cerebral infarction without residual deficits: Secondary | ICD-10-CM

## 2019-07-05 DIAGNOSIS — E039 Hypothyroidism, unspecified: Secondary | ICD-10-CM | POA: Diagnosis present

## 2019-07-05 DIAGNOSIS — K219 Gastro-esophageal reflux disease without esophagitis: Secondary | ICD-10-CM | POA: Diagnosis present

## 2019-07-05 DIAGNOSIS — J150 Pneumonia due to Klebsiella pneumoniae: Secondary | ICD-10-CM | POA: Diagnosis not present

## 2019-07-05 DIAGNOSIS — E1142 Type 2 diabetes mellitus with diabetic polyneuropathy: Secondary | ICD-10-CM | POA: Diagnosis present

## 2019-07-05 DIAGNOSIS — Z6838 Body mass index (BMI) 38.0-38.9, adult: Secondary | ICD-10-CM

## 2019-07-05 DIAGNOSIS — Z20822 Contact with and (suspected) exposure to covid-19: Secondary | ICD-10-CM | POA: Diagnosis present

## 2019-07-05 DIAGNOSIS — Z978 Presence of other specified devices: Secondary | ICD-10-CM

## 2019-07-05 DIAGNOSIS — F17210 Nicotine dependence, cigarettes, uncomplicated: Secondary | ICD-10-CM | POA: Diagnosis present

## 2019-07-05 DIAGNOSIS — Z9981 Dependence on supplemental oxygen: Secondary | ICD-10-CM | POA: Diagnosis not present

## 2019-07-05 DIAGNOSIS — Z794 Long term (current) use of insulin: Secondary | ICD-10-CM | POA: Diagnosis not present

## 2019-07-05 DIAGNOSIS — J9621 Acute and chronic respiratory failure with hypoxia: Secondary | ICD-10-CM | POA: Diagnosis present

## 2019-07-05 DIAGNOSIS — N170 Acute kidney failure with tubular necrosis: Secondary | ICD-10-CM | POA: Diagnosis present

## 2019-07-05 DIAGNOSIS — Z1635 Resistance to multiple antimicrobial drugs: Secondary | ICD-10-CM | POA: Diagnosis present

## 2019-07-05 DIAGNOSIS — R6521 Severe sepsis with septic shock: Secondary | ICD-10-CM | POA: Diagnosis present

## 2019-07-05 DIAGNOSIS — R197 Diarrhea, unspecified: Secondary | ICD-10-CM | POA: Diagnosis present

## 2019-07-05 DIAGNOSIS — Z79890 Hormone replacement therapy (postmenopausal): Secondary | ICD-10-CM

## 2019-07-05 DIAGNOSIS — A498 Other bacterial infections of unspecified site: Secondary | ICD-10-CM | POA: Diagnosis not present

## 2019-07-05 DIAGNOSIS — J9601 Acute respiratory failure with hypoxia: Secondary | ICD-10-CM | POA: Diagnosis not present

## 2019-07-05 DIAGNOSIS — R569 Unspecified convulsions: Secondary | ICD-10-CM | POA: Diagnosis present

## 2019-07-05 DIAGNOSIS — Z7952 Long term (current) use of systemic steroids: Secondary | ICD-10-CM

## 2019-07-05 DIAGNOSIS — E7800 Pure hypercholesterolemia, unspecified: Secondary | ICD-10-CM | POA: Diagnosis present

## 2019-07-05 DIAGNOSIS — A4159 Other Gram-negative sepsis: Principal | ICD-10-CM | POA: Diagnosis present

## 2019-07-05 DIAGNOSIS — N201 Calculus of ureter: Secondary | ICD-10-CM | POA: Diagnosis not present

## 2019-07-05 DIAGNOSIS — N200 Calculus of kidney: Secondary | ICD-10-CM | POA: Diagnosis not present

## 2019-07-05 DIAGNOSIS — Z9221 Personal history of antineoplastic chemotherapy: Secondary | ICD-10-CM

## 2019-07-05 DIAGNOSIS — I10 Essential (primary) hypertension: Secondary | ICD-10-CM | POA: Diagnosis present

## 2019-07-05 DIAGNOSIS — J441 Chronic obstructive pulmonary disease with (acute) exacerbation: Secondary | ICD-10-CM | POA: Diagnosis not present

## 2019-07-05 DIAGNOSIS — J440 Chronic obstructive pulmonary disease with acute lower respiratory infection: Secondary | ICD-10-CM | POA: Diagnosis not present

## 2019-07-05 DIAGNOSIS — C099 Malignant neoplasm of tonsil, unspecified: Secondary | ICD-10-CM | POA: Diagnosis present

## 2019-07-05 DIAGNOSIS — J9602 Acute respiratory failure with hypercapnia: Secondary | ICD-10-CM | POA: Diagnosis present

## 2019-07-05 DIAGNOSIS — Z79899 Other long term (current) drug therapy: Secondary | ICD-10-CM

## 2019-07-05 DIAGNOSIS — N136 Pyonephrosis: Secondary | ICD-10-CM | POA: Diagnosis present

## 2019-07-05 DIAGNOSIS — Z820 Family history of epilepsy and other diseases of the nervous system: Secondary | ICD-10-CM

## 2019-07-05 DIAGNOSIS — Z833 Family history of diabetes mellitus: Secondary | ICD-10-CM

## 2019-07-05 DIAGNOSIS — Z7901 Long term (current) use of anticoagulants: Secondary | ICD-10-CM

## 2019-07-05 DIAGNOSIS — G4730 Sleep apnea, unspecified: Secondary | ICD-10-CM | POA: Diagnosis present

## 2019-07-05 DIAGNOSIS — E876 Hypokalemia: Secondary | ICD-10-CM | POA: Diagnosis not present

## 2019-07-05 DIAGNOSIS — Z791 Long term (current) use of non-steroidal anti-inflammatories (NSAID): Secondary | ICD-10-CM

## 2019-07-05 DIAGNOSIS — B961 Klebsiella pneumoniae [K. pneumoniae] as the cause of diseases classified elsewhere: Secondary | ICD-10-CM | POA: Diagnosis present

## 2019-07-05 DIAGNOSIS — A419 Sepsis, unspecified organism: Secondary | ICD-10-CM | POA: Diagnosis present

## 2019-07-05 DIAGNOSIS — N132 Hydronephrosis with renal and ureteral calculous obstruction: Secondary | ICD-10-CM | POA: Diagnosis not present

## 2019-07-05 DIAGNOSIS — G9341 Metabolic encephalopathy: Secondary | ICD-10-CM | POA: Diagnosis present

## 2019-07-05 DIAGNOSIS — E669 Obesity, unspecified: Secondary | ICD-10-CM | POA: Diagnosis present

## 2019-07-05 DIAGNOSIS — E785 Hyperlipidemia, unspecified: Secondary | ICD-10-CM | POA: Diagnosis present

## 2019-07-05 HISTORY — PX: CYSTOSCOPY WITH STENT PLACEMENT: SHX5790

## 2019-07-05 LAB — URINALYSIS, COMPLETE (UACMP) WITH MICROSCOPIC
Bilirubin Urine: NEGATIVE
Glucose, UA: NEGATIVE mg/dL
Ketones, ur: 5 mg/dL — AB
Nitrite: POSITIVE — AB
Protein, ur: 100 mg/dL — AB
Specific Gravity, Urine: 1.023 (ref 1.005–1.030)
WBC, UA: >50 WBC/hpf — ABNORMAL HIGH (ref 0–5)
pH: 6 (ref 5.0–8.0)

## 2019-07-05 LAB — CBC WITH DIFFERENTIAL/PLATELET
Abs Immature Granulocytes: 0.04 10*3/uL (ref 0.00–0.07)
Basophils Absolute: 0 10*3/uL (ref 0.0–0.1)
Basophils Relative: 0 %
Eosinophils Absolute: 0 10*3/uL (ref 0.0–0.5)
Eosinophils Relative: 0 %
HCT: 45.4 % (ref 39.0–52.0)
Hemoglobin: 14.9 g/dL (ref 13.0–17.0)
Immature Granulocytes: 0 %
Lymphocytes Relative: 2 %
Lymphs Abs: 0.2 10*3/uL — ABNORMAL LOW (ref 0.7–4.0)
MCH: 30.3 pg (ref 26.0–34.0)
MCHC: 32.8 g/dL (ref 30.0–36.0)
MCV: 92.5 fL (ref 80.0–100.0)
Monocytes Absolute: 0.2 10*3/uL (ref 0.1–1.0)
Monocytes Relative: 2 %
Neutro Abs: 10.8 10*3/uL — ABNORMAL HIGH (ref 1.7–7.7)
Neutrophils Relative %: 96 %
Platelets: 139 10*3/uL — ABNORMAL LOW (ref 150–400)
RBC: 4.91 MIL/uL (ref 4.22–5.81)
RDW: 13.9 % (ref 11.5–15.5)
WBC: 11.3 10*3/uL — ABNORMAL HIGH (ref 4.0–10.5)
nRBC: 0 % (ref 0.0–0.2)

## 2019-07-05 LAB — PROCALCITONIN: Procalcitonin: 8.76 ng/mL

## 2019-07-05 LAB — COMPREHENSIVE METABOLIC PANEL
ALT: 13 U/L (ref 0–44)
AST: 30 U/L (ref 15–41)
Albumin: 3.5 g/dL (ref 3.5–5.0)
Alkaline Phosphatase: 93 U/L (ref 38–126)
Anion gap: 14 (ref 5–15)
BUN: 33 mg/dL — ABNORMAL HIGH (ref 8–23)
CO2: 21 mmol/L — ABNORMAL LOW (ref 22–32)
Calcium: 9 mg/dL (ref 8.9–10.3)
Chloride: 101 mmol/L (ref 98–111)
Creatinine, Ser: 2.91 mg/dL — ABNORMAL HIGH (ref 0.61–1.24)
GFR calc Af Amer: 24 mL/min — ABNORMAL LOW (ref >60)
GFR calc non Af Amer: 21 mL/min — ABNORMAL LOW (ref >60)
Glucose, Bld: 69 mg/dL — ABNORMAL LOW (ref 70–99)
Potassium: 4.5 mmol/L (ref 3.5–5.1)
Sodium: 136 mmol/L (ref 135–145)
Total Bilirubin: 2.2 mg/dL — ABNORMAL HIGH (ref 0.3–1.2)
Total Protein: 6.3 g/dL — ABNORMAL LOW (ref 6.5–8.1)

## 2019-07-05 LAB — TROPONIN I (HIGH SENSITIVITY)
Troponin I (High Sensitivity): 55 ng/L — ABNORMAL HIGH (ref <18)
Troponin I (High Sensitivity): 63 ng/L — ABNORMAL HIGH (ref <18)

## 2019-07-05 LAB — HIV ANTIBODY (ROUTINE TESTING W REFLEX): HIV Screen 4th Generation wRfx: NONREACTIVE

## 2019-07-05 LAB — RESPIRATORY PANEL BY RT PCR (FLU A&B, COVID)
Influenza A by PCR: NEGATIVE
Influenza B by PCR: NEGATIVE
SARS Coronavirus 2 by RT PCR: NEGATIVE

## 2019-07-05 LAB — LACTIC ACID, PLASMA
Lactic Acid, Venous: 5.2 mmol/L (ref 0.5–1.9)
Lactic Acid, Venous: 3.3 mmol/L (ref 0.5–1.9)
Lactic Acid, Venous: 2.6 mmol/L (ref 0.5–1.9)

## 2019-07-05 LAB — CK: Total CK: 808 U/L — ABNORMAL HIGH (ref 49–397)

## 2019-07-05 LAB — MRSA PCR SCREENING: MRSA by PCR: NEGATIVE

## 2019-07-05 LAB — GLUCOSE, CAPILLARY
Glucose-Capillary: 68 mg/dL — ABNORMAL LOW (ref 70–99)
Glucose-Capillary: 73 mg/dL (ref 70–99)
Glucose-Capillary: 61 mg/dL — ABNORMAL LOW (ref 70–99)

## 2019-07-05 LAB — T4, FREE: Free T4: 1.91 ng/dL — ABNORMAL HIGH (ref 0.61–1.12)

## 2019-07-05 LAB — TSH: TSH: <0.01 u[IU]/mL — ABNORMAL LOW (ref 0.350–4.500)

## 2019-07-05 LAB — POC SARS CORONAVIRUS 2 AG: SARS Coronavirus 2 Ag: NEGATIVE

## 2019-07-05 SURGERY — CYSTOSCOPY, WITH STENT INSERTION
Anesthesia: General | Laterality: Right

## 2019-07-05 MED ORDER — NOREPINEPHRINE 4 MG/250ML-% IV SOLN
0.0000 ug/min | INTRAVENOUS | Status: DC
  Administered 2019-07-05: 2 ug/min via INTRAVENOUS
  Filled 2019-07-05: qty 250

## 2019-07-05 MED ORDER — SODIUM CHLORIDE 0.9% FLUSH
3.0000 mL | Freq: Two times a day (BID) | INTRAVENOUS | Status: DC
  Administered 2019-07-05: 3 mL via INTRAVENOUS

## 2019-07-05 MED ORDER — FENTANYL BOLUS VIA INFUSION
25.0000 ug | INTRAVENOUS | Status: DC | PRN
  Filled 2019-07-05: qty 25

## 2019-07-05 MED ORDER — ORAL CARE MOUTH RINSE: 15.0000 mL | Freq: Two times a day (BID) | OROMUCOSAL | Status: DC

## 2019-07-05 MED ORDER — SUCCINYLCHOLINE CHLORIDE 20 MG/ML IJ SOLN
INTRAMUSCULAR | Status: DC | PRN
  Administered 2019-07-05: 140 mg via INTRAVENOUS

## 2019-07-05 MED ORDER — LEVALBUTEROL HCL 1.25 MG/0.5ML IN NEBU
1.2500 mg | INHALATION_SOLUTION | Freq: Four times a day (QID) | RESPIRATORY_TRACT | Status: DC | PRN
  Administered 2019-07-05: 1.25 mg via RESPIRATORY_TRACT
  Filled 2019-07-05: qty 0.5

## 2019-07-05 MED ORDER — LACTATED RINGERS IV SOLN: INTRAVENOUS | Status: DC

## 2019-07-05 MED ORDER — ACETAMINOPHEN 325 MG PO TABS: 650.0000 mg | ORAL_TABLET | ORAL | Status: DC | PRN

## 2019-07-05 MED ORDER — FAMOTIDINE IN NACL 20-0.9 MG/50ML-% IV SOLN
20.0000 mg | Freq: Two times a day (BID) | INTRAVENOUS | Status: DC
  Administered 2019-07-05 – 2019-07-09 (×8): 20 mg via INTRAVENOUS
  Filled 2019-07-05 (×8): qty 50

## 2019-07-05 MED ORDER — SODIUM CHLORIDE 0.9 % IV SOLN: 250.0000 mL | INTRAVENOUS | Status: DC | PRN

## 2019-07-05 MED ORDER — KETAMINE HCL 10 MG/ML IJ SOLN
INTRAMUSCULAR | Status: DC | PRN
  Administered 2019-07-05: 30 mg via INTRAVENOUS
  Administered 2019-07-05: 20 mg via INTRAVENOUS

## 2019-07-05 MED ORDER — NOREPINEPHRINE 4 MG/250ML-% IV SOLN: 2.0000 ug/min | INTRAVENOUS | Status: DC

## 2019-07-05 MED ORDER — SODIUM CHLORIDE 0.9 % IV BOLUS: 1000.0000 mL | Freq: Once | INTRAVENOUS | Status: DC

## 2019-07-05 MED ORDER — CHLORHEXIDINE GLUCONATE 0.12 % MT SOLN
15.0000 mL | Freq: Two times a day (BID) | OROMUCOSAL | Status: DC
  Administered 2019-07-06 – 2019-07-12 (×9): 15 mL via OROMUCOSAL
  Filled 2019-07-05 (×7): qty 15

## 2019-07-05 MED ORDER — DEXAMETHASONE SODIUM PHOSPHATE 10 MG/ML IJ SOLN
INTRAMUSCULAR | Status: DC | PRN
  Administered 2019-07-05: 10 mg via INTRAVENOUS

## 2019-07-05 MED ORDER — LACTATED RINGERS IV BOLUS
500.0000 mL | Freq: Once | INTRAVENOUS | Status: AC
  Administered 2019-07-05: 500 mL via INTRAVENOUS

## 2019-07-05 MED ORDER — ROCURONIUM BROMIDE 100 MG/10ML IV SOLN
INTRAVENOUS | Status: DC | PRN
  Administered 2019-07-05: 20 mg via INTRAVENOUS

## 2019-07-05 MED ORDER — SODIUM CHLORIDE 0.9% FLUSH: 3.0000 mL | Freq: Once | INTRAVENOUS | Status: DC

## 2019-07-05 MED ORDER — FENTANYL 2500MCG IN NS 250ML (10MCG/ML) PREMIX INFUSION
25.0000 ug/h | INTRAVENOUS | Status: DC
  Administered 2019-07-06: 100 ug/h via INTRAVENOUS
  Administered 2019-07-06 (×2): 200 ug/h via INTRAVENOUS
  Filled 2019-07-05 (×3): qty 250

## 2019-07-05 MED ORDER — PROPOFOL 10 MG/ML IV BOLUS
INTRAVENOUS | Status: AC
  Filled 2019-07-05: qty 20

## 2019-07-05 MED ORDER — SODIUM CHLORIDE 0.9 % IV SOLN
1.0000 g | Freq: Two times a day (BID) | INTRAVENOUS | Status: DC
  Administered 2019-07-05: 1 g via INTRAVENOUS
  Filled 2019-07-05 (×3): qty 1

## 2019-07-05 MED ORDER — PHENYLEPHRINE HCL (PRESSORS) 10 MG/ML IV SOLN
INTRAVENOUS | Status: DC | PRN
  Administered 2019-07-05: 100 ug via INTRAVENOUS

## 2019-07-05 MED ORDER — LACTATED RINGERS IV BOLUS
1000.0000 mL | Freq: Once | INTRAVENOUS | Status: AC
  Administered 2019-07-05: 1000 mL via INTRAVENOUS

## 2019-07-05 MED ORDER — NOREPINEPHRINE BITARTRATE 1 MG/ML IV SOLN
INTRAVENOUS | Status: DC | PRN
  Administered 2019-07-05: 2 mL via INTRAVENOUS
  Administered 2019-07-05: 1 mL via INTRAVENOUS
  Administered 2019-07-05: .5 mL via INTRAVENOUS

## 2019-07-05 MED ORDER — CEFTRIAXONE SODIUM 250 MG IJ SOLR
250.0000 mg | INTRAMUSCULAR | Status: DC
  Filled 2019-07-05 (×2): qty 250

## 2019-07-05 MED ORDER — VANCOMYCIN HCL IN DEXTROSE 1-5 GM/200ML-% IV SOLN
1000.0000 mg | Freq: Once | INTRAVENOUS | Status: AC
  Administered 2019-07-05: 1000 mg via INTRAVENOUS
  Filled 2019-07-05: qty 200

## 2019-07-05 MED ORDER — MIDAZOLAM HCL 2 MG/2ML IJ SOLN
1.0000 mg | INTRAMUSCULAR | Status: DC | PRN
  Administered 2019-07-06 – 2019-07-07 (×5): 1 mg via INTRAVENOUS
  Filled 2019-07-05 (×5): qty 2

## 2019-07-05 MED ORDER — KETAMINE HCL 50 MG/ML IJ SOLN
INTRAMUSCULAR | Status: AC
  Filled 2019-07-05: qty 10

## 2019-07-05 MED ORDER — LACTATED RINGERS IV BOLUS: 1000.0000 mL | Freq: Once | INTRAVENOUS | Status: DC

## 2019-07-05 MED ORDER — VASOPRESSIN 20 UNIT/ML IV SOLN
INTRAVENOUS | Status: DC | PRN
  Administered 2019-07-05: 1 [IU] via INTRAVENOUS
  Administered 2019-07-05 (×2): 2 [IU] via INTRAVENOUS
  Administered 2019-07-05: 3 [IU] via INTRAVENOUS

## 2019-07-05 MED ORDER — MIDAZOLAM HCL 2 MG/2ML IJ SOLN
1.0000 mg | INTRAMUSCULAR | Status: AC | PRN
  Administered 2019-07-06 (×3): 1 mg via INTRAVENOUS
  Filled 2019-07-05 (×4): qty 2

## 2019-07-05 MED ORDER — SODIUM CHLORIDE 0.9 % IV BOLUS
1000.0000 mL | Freq: Once | INTRAVENOUS | Status: AC
  Administered 2019-07-05: 1000 mL via INTRAVENOUS

## 2019-07-05 MED ORDER — IOHEXOL 180 MG/ML  SOLN
INTRAMUSCULAR | Status: DC | PRN
  Administered 2019-07-05: 12 mL

## 2019-07-05 MED ORDER — PROPOFOL 10 MG/ML IV BOLUS
INTRAVENOUS | Status: DC | PRN
  Administered 2019-07-05 (×2): 50 mg via INTRAVENOUS

## 2019-07-05 MED ORDER — PROPOFOL 500 MG/50ML IV EMUL
INTRAVENOUS | Status: AC
  Filled 2019-07-05: qty 50

## 2019-07-05 MED ORDER — SODIUM CHLORIDE 0.9% FLUSH: 3.0000 mL | INTRAVENOUS | Status: DC | PRN

## 2019-07-05 MED ORDER — METRONIDAZOLE IN NACL 5-0.79 MG/ML-% IV SOLN
500.0000 mg | Freq: Once | INTRAVENOUS | Status: AC
  Administered 2019-07-05: 500 mg via INTRAVENOUS
  Filled 2019-07-05 (×2): qty 100

## 2019-07-05 MED ORDER — ALPRAZOLAM 0.5 MG PO TABS
0.5000 mg | ORAL_TABLET | Freq: Two times a day (BID) | ORAL | Status: DC | PRN
  Administered 2019-07-05: 0.5 mg via ORAL
  Filled 2019-07-05: qty 1

## 2019-07-05 MED ORDER — HEPARIN SODIUM (PORCINE) 5000 UNIT/ML IJ SOLN
5000.0000 [IU] | Freq: Three times a day (TID) | INTRAMUSCULAR | Status: DC
  Administered 2019-07-05 – 2019-07-09 (×11): 5000 [IU] via SUBCUTANEOUS
  Filled 2019-07-05 (×11): qty 1

## 2019-07-05 MED ORDER — SODIUM CHLORIDE 0.9 % IV SOLN
2.0000 g | Freq: Once | INTRAVENOUS | Status: AC
  Administered 2019-07-05: 2 g via INTRAVENOUS
  Filled 2019-07-05: qty 2

## 2019-07-05 MED ORDER — SODIUM CHLORIDE 0.9 % IV SOLN
250.0000 mL | INTRAVENOUS | Status: DC
  Administered 2019-07-11: 21:00:00 250 mL via INTRAVENOUS

## 2019-07-05 MED ORDER — NOREPINEPHRINE 4 MG/250ML-% IV SOLN
INTRAVENOUS | Status: DC | PRN
  Administered 2019-07-05: 4 ug/min via INTRAVENOUS

## 2019-07-05 MED ORDER — DEXTROSE 50 % IV SOLN
INTRAVENOUS | Status: AC
  Administered 2019-07-05: 50 mL
  Filled 2019-07-05: qty 50

## 2019-07-05 MED ORDER — DEXTROSE 50 % IV SOLN
12.5000 g | Freq: Once | INTRAVENOUS | Status: AC
  Administered 2019-07-05: 12.5 g via INTRAVENOUS
  Filled 2019-07-05: qty 50

## 2019-07-05 MED ORDER — FENTANYL CITRATE (PF) 100 MCG/2ML IJ SOLN: 25.0000 ug | Freq: Once | INTRAMUSCULAR | Status: DC

## 2019-07-05 MED ORDER — ONDANSETRON HCL 4 MG/2ML IJ SOLN: 4.0000 mg | Freq: Four times a day (QID) | INTRAMUSCULAR | Status: DC | PRN

## 2019-07-05 MED ORDER — DEXTROSE 50 % IV SOLN
25.0000 mL | Freq: Once | INTRAVENOUS | Status: AC
  Administered 2019-07-05: 25 mL via INTRAVENOUS
  Filled 2019-07-05: qty 50

## 2019-07-05 MED ORDER — EPHEDRINE SULFATE 50 MG/ML IJ SOLN
INTRAMUSCULAR | Status: AC
  Filled 2019-07-05: qty 1

## 2019-07-05 MED ORDER — SODIUM CHLORIDE (PF) 0.9 % IJ SOLN
INTRAMUSCULAR | Status: AC
  Filled 2019-07-05: qty 10

## 2019-07-05 SURGICAL SUPPLY — 25 items
BAG DRAIN CYSTO-URO LG1000N (MISCELLANEOUS) ×2 IMPLANT
BAG URINE DRAIN 2000ML AR STRL (UROLOGICAL SUPPLIES) ×2 IMPLANT
BRUSH SCRUB EZ  4% CHG (MISCELLANEOUS)
BRUSH SCRUB EZ 4% CHG (MISCELLANEOUS) IMPLANT
CATH FOLEY 2WAY  5CC 16FR (CATHETERS) ×1
CATH URETL 5X70 OPEN END (CATHETERS) ×2 IMPLANT
CATH URTH 16FR FL 2W BLN LF (CATHETERS) ×1 IMPLANT
CNTNR SPEC 2.5X3XGRAD LEK (MISCELLANEOUS) ×1
CONT SPEC 4OZ STER OR WHT (MISCELLANEOUS) ×1
CONTAINER SPEC 2.5X3XGRAD LEK (MISCELLANEOUS) ×1 IMPLANT
GLOVE BIO SURGEON STRL SZ8 (GLOVE) ×4 IMPLANT
GOWN STRL REUS W/ TWL XL LVL3 (GOWN DISPOSABLE) ×2 IMPLANT
GOWN STRL REUS W/TWL XL LVL3 (GOWN DISPOSABLE) ×2
GUIDEWIRE STR DUAL SENSOR (WIRE) ×2 IMPLANT
KIT TURNOVER CYSTO (KITS) ×2 IMPLANT
PACK CYSTO AR (MISCELLANEOUS) ×2 IMPLANT
SET CYSTO W/LG BORE CLAMP LF (SET/KITS/TRAYS/PACK) ×2 IMPLANT
SOL .9 NS 3000ML IRR  AL (IV SOLUTION) ×1
SOL .9 NS 3000ML IRR UROMATIC (IV SOLUTION) ×1 IMPLANT
STENT URET 6FRX24 CONTOUR (STENTS) IMPLANT
STENT URET 6FRX26 CONTOUR (STENTS) ×2 IMPLANT
SURGILUBE 2OZ TUBE FLIPTOP (MISCELLANEOUS) ×2 IMPLANT
SYR 10ML LL (SYRINGE) ×2 IMPLANT
SYR 30ML LL (SYRINGE) ×2 IMPLANT
WATER STERILE IRR 1000ML POUR (IV SOLUTION) ×2 IMPLANT

## 2019-07-05 NOTE — Progress Notes (Signed)
Pharmacy Antibiotic Note  Shawn Lindsey is a 69 y.o. male admitted on 07/05/2019 with UTI.  Pharmacy has been consulted for Meropenem dosing.  Plan: Meropenem 1g IV q12h  Height: 5\' 10"  (177.8 cm) Weight: 276 lb 0.3 oz (125.2 kg) IBW/kg (Calculated) : 73  Temp (24hrs), Avg:99.9 F (37.7 C), Min:99 F (37.2 C), Max:101.3 F (38.5 C)  Recent Labs  Lab 07/05/19 1452 07/05/19 1633  WBC 11.3*  --   CREATININE 2.91*  --   LATICACIDVEN 5.2* 3.3*    Estimated Creatinine Clearance: 31.8 mL/min (A) (by C-G formula based on SCr of 2.91 mg/dL (H)).    No Known Allergies  Antimicrobials this admission: Vancomycin/Cefepime/Metronidazole 3/5 x 1 in ED Meropenem 3/5 >>   Dose adjustments this admission:  Microbiology results:  Thank you for allowing pharmacy to be a part of this patient's care.  Paulina Fusi, PharmD, BCPS 07/05/2019 9:54 PM

## 2019-07-05 NOTE — ED Provider Notes (Signed)
[facility] Emergency Department Provider Note    First MD Initiated Contact with Patient 07/05/19 1450     (approximate)  I have reviewed the triage vital signs and the nursing notes.   HISTORY  Chief Complaint Weakness    HPI Shawn Lindsey is a 69 y.o. male below listed past medical history presents to the ER for evaluation of generalized weakness malaise is progressively worse over the past 2 days.  States his been having daily diarrheal episodes.  Denies any abdominal pain.  No chest pain or shortness of breath.  Was found by EMS to be hypotensive low-grade temperature.  Is not been on any recent antibiotics.  States has been compliant with his medications.  Denies any melena or hematochezia.    Past Medical History:  Diagnosis Date  . Anxiety    mild  . 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 COPD (chronic obstructive pulmonary disease) ([place])   . Diabetes mellitus without complication ([place])   . Elevated cholesterol   . GERD (gastroesophageal reflux disease)   . Hypertension   . Hypothyroidism   . Seizures ([place]) 2015   related to sepsis  . Stroke [facility]) 2007   no peripheral vision on left side of both eyes & left arm gets numb under stress   Family History  Problem Relation Age of Onset  . Diabetes Brother   . Dementia Mother    Past Surgical History:  Procedure Laterality Date  . ELECTROMAGNETIC NAVIGATION BROCHOSCOPY N/A 03/09/2016   Procedure: ELECTROMAGNETIC NAVIGATION BRONCHOSCOPY;  Surgeon: Flora Lipps, MD;  Location: [facility] ORS;  Service: Cardiopulmonary;  Laterality: N/A;  . TONSILLECTOMY    . TRACHEOSTOMY     Patient Active Problem List   Diagnosis Date Noted  . Mass of upper lobe of right lung 09/29/2015  . 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 HTN (hypertension) 08/05/2014  . COPD (chronic obstructive pulmonary disease) ([place]) 08/05/2014  . Hyperlipidemia 08/05/2014      Prior to Admission medications   Medication Sig Start Date  End Date Taking? Authorizing Provider  amLODipine (NORVASC) 5 MG tablet Take 5 mg by mouth daily. 02/16/16   [provider]  atorvastatin (LIPITOR) 80 MG tablet Take 80 mg by mouth daily after breakfast.     [provider]  Cholecalciferol 2000 UNITS CAPS Take 2,000 Units by mouth daily.     [provider]  FLUoxetine (PROZAC) 10 MG capsule Take 10 mg by mouth daily.    [provider]  gabapentin (NEURONTIN) 100 MG capsule Take 600 mg by mouth at bedtime.     [provider]  HYDROcodone-acetaminophen (NORCO/VICODIN) 5-325 MG tablet Take 1 tablet by mouth every 6 (six) hours as needed for moderate pain. 04/15/18   Johnn Hai, PA-C  levETIRAcetam (KEPPRA) 500 MG tablet Take 500 mg by mouth 2 (two) times daily.    [provider]  levothyroxine (SYNTHROID, LEVOTHROID) 125 MCG tablet Take 125 mcg by mouth daily before breakfast.     [provider]  losartan (COZAAR) 100 MG tablet Take 100 mg by mouth at bedtime.    [provider]  magnesium oxide (MAG-OX) 400 MG tablet Take 400 mg by mouth 2 (two) times daily.    [provider]  naproxen (NAPROSYN) 500 MG tablet Take 500 mg by mouth 2 (two) times daily with a meal.    [provider]  Omega-3 Fatty Acids (FISH OIL) 1000 MG CAPS Take  1,000 mg by mouth daily.     [provider]  omeprazole (PRILOSEC) 20 MG capsule Take 20 mg by mouth daily.    [provider]  predniSONE (STERAPRED UNI-PAK 21 TAB) 10 MG (21) TBPK tablet Take by mouth daily.    [provider]  ranitidine (ZANTAC) 150 MG tablet  03/07/16   [provider]  umeclidinium bromide (INCRUSE ELLIPTA) 62.5 MCG/INH AEPB Inhale 1 puff into the lungs daily. 05/31/19   Flora Lipps, MD  VENTOLIN HFA 108 (90 Base) MCG/ACT inhaler Inhale 1-2 puffs into the lungs every 6 (six) hours as needed (for shortness of breath/wheezing.). 12/14/18   Flora Lipps, MD     Allergies Patient has no known allergies.    Social History Social History   Tobacco Use  . Smoking status: Current Every Day Smoker    Packs/day: 1.00    Years: 40.00    Pack years: 40.00  . Smokeless tobacco: Never Used  Substance Use Topics  . Alcohol use: No  . Drug use: No    Review of Systems Patient denies headaches, rhinorrhea, blurry vision, numbness, shortness of breath, chest pain, edema, cough, abdominal pain, nausea, vomiting, diarrhea, dysuria, fevers, rashes or hallucinations unless otherwise stated above in HPI. ____________________________________________   PHYSICAL EXAM:  VITAL SIGNS: Vitals:   07/05/19 1500 07/05/19 1547  BP: (!) 91/50   Pulse: 77   Resp: 19   Temp:  (!) 101.3 F (38.5 C)  SpO2: 96%     Constitutional: Alert, frail and weak appearing.  Eyes: Conjunctivae are normal.  Head: Atraumatic. Nose: No congestion/rhinnorhea. Mouth/Throat: Mucous membranes are moist.   Neck: No stridor. Painless ROM.  Cardiovascular: Normal rate, regular rhythm. Grossly normal heart sounds.  Good peripheral circulation. Respiratory: Normal respiratory effort.  No retractions. Lungs with bibasilar crackles and scattered wheeze Gastrointestinal: Soft and nontender. No distention. No abdominal bruits. No CVA tenderness. Genitourinary:  Musculoskeletal: No lower extremity tenderness, 1+ BLEedema.  No joint effusions. Neurologic:  No gross focal neurologic deficits are appreciated. No facial droop Skin:  Skin is warm, dry and intact. No rash noted. Psychiatric: calm and cooperative  ____________________________________________   LABS (all labs ordered are listed, but only abnormal results are displayed)  Results for orders placed or performed during the hospital encounter of 07/05/19 (from the past 24 hour(s))  Lactic acid, plasma     Status: Abnormal   Collection Time: 07/05/19  2:52 PM  Result Value Ref Range   Lactic Acid, Venous 5.2 (HH) 0.5  - 1.9 mmol/L  Comprehensive metabolic panel     Status: Abnormal   Collection Time: 07/05/19  2:52 PM  Result Value Ref Range   Sodium 136 135 - 145 mmol/L   Potassium 4.5 3.5 - 5.1 mmol/L   Chloride 101 98 - 111 mmol/L   CO2 21 (L) 22 - 32 mmol/L   Glucose, Bld 69 (L) 70 - 99 mg/dL   BUN 33 (H) 8 - 23 mg/dL   Creatinine, Ser 2.91 (H) 0.61 - 1.24 mg/dL   Calcium 9.0 8.9 - 10.3 mg/dL   Total Protein 6.3 (L) 6.5 - 8.1 g/dL   Albumin 3.5 3.5 - 5.0 g/dL   AST 30 15 - 41 U/L   ALT 13 0 - 44 U/L   Alkaline Phosphatase 93 38 - 126 U/L   Total Bilirubin 2.2 (H) 0.3 - 1.2 mg/dL   GFR calc non Af Amer 21 (L) >60 mL/min   GFR calc Af Wyvonnia Lora  24 (L) >60 mL/min   Anion gap 14 5 - 15  CBC WITH DIFFERENTIAL     Status: Abnormal   Collection Time: 07/05/19  2:52 PM  Result Value Ref Range   WBC 11.3 (H) 4.0 - 10.5 K/uL   RBC 4.91 4.22 - 5.81 MIL/uL   Hemoglobin 14.9 13.0 - 17.0 g/dL   HCT 45.4 39.0 - 52.0 %   MCV 92.5 80.0 - 100.0 fL   MCH 30.3 26.0 - 34.0 pg   MCHC 32.8 30.0 - 36.0 g/dL   RDW 13.9 11.5 - 15.5 %   Platelets 139 (L) 150 - 400 K/uL   nRBC 0.0 0.0 - 0.2 %   Neutrophils Relative % 96 %   Neutro Abs 10.8 (H) 1.7 - 7.7 K/uL   Lymphocytes Relative 2 %   Lymphs Abs 0.2 (L) 0.7 - 4.0 K/uL   Monocytes Relative 2 %   Monocytes Absolute 0.2 0.1 - 1.0 K/uL   Eosinophils Relative 0 %   Eosinophils Absolute 0.0 0.0 - 0.5 K/uL   Basophils Relative 0 %   Basophils Absolute 0.0 0.0 - 0.1 K/uL   Immature Granulocytes 0 %   Abs Immature Granulocytes 0.04 0.00 - 0.07 K/uL  TSH     Status: Abnormal   Collection Time: 07/05/19  2:52 PM  Result Value Ref Range   TSH <0.010 (L) 0.350 - 4.500 uIU/mL   ____________________________________________  EKG My review and personal interpretation at Time: 14:46   Indication: hypotension  Rate: 80  Rhythm: sinus Axis: normal Other: normal intervals, no stemi ____________________________________________  RADIOLOGY  I personally reviewed all  radiographic images ordered to evaluate for the above acute complaints and reviewed radiology reports and findings.  These findings were personally discussed with the patient.  Please see medical record for radiology report.  ____________________________________________   PROCEDURES  Procedure(s) performed:  .Critical Care Performed by: Merlyn Lot, MD Authorized by: Merlyn Lot, MD   Critical care provider statement:    Critical care time (minutes):  45   Critical care time was exclusive of:  Separately billable procedures and treating other patients   Critical care was necessary to treat or prevent imminent or life-threatening deterioration of the following conditions:  Sepsis   Critical care was time spent personally by me on the following activities:  Development of treatment plan with patient or surrogate, discussions with consultants, evaluation of patient's response to treatment, examination of patient, obtaining history from patient or surrogate, ordering and performing treatments and interventions, ordering and review of laboratory studies, ordering and review of radiographic studies, pulse oximetry, re-evaluation of patient's condition and review of old charts      Critical Care performed: yes ____________________________________________   INITIAL IMPRESSION / [place] / ED COURSE  Pertinent labs & imaging results that were available during my care of the patient were reviewed by me and considered in my medical decision making (see chart for details).   DDX: Dehydration, sepsis, pna, uti, hypoglycemia, cva, drug effect, withdrawal, encephalitis   Shawn Lindsey is a 69 y.o. who presents to the ED with symptoms as described above.  Patient hypotensive febrile meeting criteria for sepsis.  Will give IV fluids and IV fluids did improve blood pressure.  Lactic acid is elevated.  Patient somewhat of a poor historian but does have some hypoxia and  infiltrates on chest x-ray may be having pneumonia.  Does have known malignancy.  Clinical Course as of Jul 04 1636  Fri  Jul 05, 2019  1622 Blood pressure did improve with IV fluids but still with maps around 60 will complete full IV fluid bolus for sepsis but also order nor epi due to concern for septic shock.  Will discuss with intensivist for admission.   [PR]    Clinical Course User Index [PR] Merlyn Lot, MD    The patient was evaluated in Emergency Department today for the symptoms described in the history of present illness. He/she was evaluated in the context of the global COVID-19 pandemic, which necessitated consideration that the patient might be at risk for infection with the SARS-CoV-2 virus that causes COVID-19. Institutional protocols and algorithms that pertain to the evaluation of patients at risk for COVID-19 are in a state of rapid change based on information released by regulatory bodies including the CDC and federal and state organizations. These policies and algorithms were followed during the patient's care in the ED.  As part of my medical decision making, I reviewed the following data within the [place] notes reviewed and incorporated, Labs reviewed, notes from prior ED visits and [place] Controlled Substance Database   ____________________________________________   FINAL CLINICAL IMPRESSION(S) / ED DIAGNOSES  Final diagnoses:  Sepsis without acute organ dysfunction, due to unspecified organism ([place])  Acute respiratory failure with hypoxia ([place])      NEW MEDICATIONS STARTED DURING THIS VISIT:  New Prescriptions   No medications on file     Note:  This document was prepared using Dragon voice recognition software and may include unintentional dictation errors.    Merlyn Lot, MD 07/05/19 [id number]

## 2019-07-05 NOTE — Consult Note (Signed)
Urology Consult  Requesting provider: Darel Hong, NP  Reason for consultation: Sepsis secondary to pyelonephritis, right ureteral calculus  Chief Complaint: N/A  History of Present Illness: Shawn Lindsey is a 69 y.o. male who was transported to the ED today by EMS with a history of generalized weakness and malaise over the preceding 2 days.  Initial EMS assessment patient was found to be hypotensive with low-grade temp.  Recently with daily diarrhea.  Past medical history remarkable for tonsillar cancer, COPD, diabetes, hypertension and prior CVA.  Was hospitalized approximately 5 years ago for pneumonia and respiratory failure and required trach.  - Evaluation in the ED remarkable for sepsis felt secondary to pyelonephritis/UTI -Lactate elevated 5.2; WBC 11.3; UA nitrite positive, >50 WBC, many bacteria; respiratory panel negative influenza/Covid -CT performed later this evening remarkable for a lower pole partial staghorn calculus and a 5 mm right proximal ureteral calculus with mild hydronephrosis.  Parenchymal edema and mild perinephric stranding noted   Past Medical History:  Diagnosis Date  . Anxiety    mild  . 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 COPD (chronic obstructive pulmonary disease) ([place])   . Diabetes mellitus without complication ([place])   . Elevated cholesterol   . GERD (gastroesophageal reflux disease)   . Hypertension   . Hypothyroidism   . Seizures ([place]) 2015   related to sepsis  . Stroke [facility]) 2007   no peripheral vision on left side of both eyes & left arm gets numb under stress    Past Surgical History:  Procedure Laterality Date  . ELECTROMAGNETIC NAVIGATION BROCHOSCOPY N/A 03/09/2016   Procedure: ELECTROMAGNETIC NAVIGATION BRONCHOSCOPY;  Surgeon: Flora Lipps, MD;  Location: [facility] ORS;  Service: Cardiopulmonary;  Laterality: N/A;  . TONSILLECTOMY    . TRACHEOSTOMY      Home Medications:  No outpatient medications have been marked as taking for the  07/05/19 encounter [facility] Encounter).    Allergies: No Known Allergies  Family History  Problem Relation Age of Onset  . Diabetes Brother   . Dementia Mother     Social History:  reports that he has been smoking. He has a 40.00 pack-year smoking history. He has never used smokeless tobacco. He reports that he does not drink alcohol or use drugs.  ROS: A complete review of systems was performed.  All systems are negative except for pertinent findings as noted.  Physical Exam:  Vital signs in last 24 hours: Temp:  [99 F (37.2 C)-101.3 F (38.5 C)] 99.3 F (37.4 C) (03/05 1810) Pulse Rate:  [77-87] 85 (03/05 1900) Resp:  [16-23] 23 (03/05 1900) BP: (66-132)/(40-110) 105/70 (03/05 1900) SpO2:  [91 %-100 %] 91 % (03/05 1900) Weight:  [125.2 kg-141.5 kg] 125.2 kg (03/05 1830) Constitutional: Somnolent, No acute distress HEENT: [place] AT, moist mucus membranes.  Trachea midline, no masses Cardiovascular: Regular rate and rhythm, no clubbing, cyanosis, or edema. Respiratory: Normal respiratory effort, lungs clear bilaterally GI: Abdomen is soft, nontender, nondistended, no abdominal masses Skin: No rashes, bruises or suspicious lesions Lymph: No cervical or inguinal adenopathy Neurologic: Grossly intact, no focal deficits, moving all 4 extremities Psychiatric: Normal mood and affect   Laboratory Data:  Recent Labs    07/05/19 1452  WBC 11.3*  HGB 14.9  HCT 45.4   Recent Labs    07/05/19 1452  NA 136  K 4.5  CL 101  CO2 21*  GLUCOSE 69*  BUN 33*  CREATININE 2.91*  CALCIUM 9.0  No results for input(s): LABPT, INR in the last 72 hours. No results for input(s): LABURIN in the last 72 hours. Results for orders placed or performed during the hospital encounter of 07/05/19  Respiratory Panel by RT PCR (Flu A&B, Covid) - Nasopharyngeal Swab     Status: None   Collection Time: 07/05/19  4:40 PM   Specimen: Nasopharyngeal Swab  Result Value Ref Range Status   SARS  Coronavirus 2 by RT PCR NEGATIVE NEGATIVE Final    Comment: (NOTE) SARS-CoV-2 target nucleic acids are NOT DETECTED. The SARS-CoV-2 RNA is generally detectable in upper respiratoy specimens during the acute phase of infection. The lowest concentration of SARS-CoV-2 viral copies this assay can detect is 131 copies/mL. A negative result does not preclude SARS-Cov-2 infection and should not be used as the sole basis for treatment or other patient management decisions. A negative result may occur with  improper specimen collection/handling, submission of specimen other than nasopharyngeal swab, presence of viral mutation(s) within the areas targeted by this assay, and inadequate number of viral copies (<131 copies/mL). A negative result must be combined with clinical observations, patient history, and epidemiological information. The expected result is Negative. Fact Sheet for Patients:  PinkCheek.be Fact Sheet for Healthcare Providers:  GravelBags.it This test is not yet ap proved or cleared by the [place] FDA and  has been authorized for detection and/or diagnosis of SARS-CoV-2 by FDA under an Emergency Use Authorization (EUA). This EUA will remain  in effect (meaning this test can be used) for the duration of the COVID-19 declaration under Section 564(b)(1) of the Act, 21 U.S.C. section 360bbb-3(b)(1), unless the authorization is terminated or revoked sooner.    Influenza A by PCR NEGATIVE NEGATIVE Final   Influenza B by PCR NEGATIVE NEGATIVE Final    Comment: (NOTE) The Xpert Xpress SARS-CoV-2/FLU/RSV assay is intended as an aid in  the diagnosis of influenza from Nasopharyngeal swab specimens and  should not be used as a sole basis for treatment. Nasal washings and  aspirates are unacceptable for Xpert Xpress SARS-CoV-2/FLU/RSV  testing. Fact Sheet for Patients: PinkCheek.be Fact Sheet  for Healthcare Providers: GravelBags.it This test is not yet approved or cleared by the [place] FDA and  has been authorized for detection and/or diagnosis of SARS-CoV-2 by  FDA under an Emergency Use Authorization (EUA). This EUA will remain  in effect (meaning this test can be used) for the duration of the  Covid-19 declaration under Section 564(b)(1) of the Act, 21  U.S.C. section 360bbb-3(b)(1), unless the authorization is  terminated or revoked. Performed at [facility], [address] [place], [place] [address]   MRSA PCR Screening     Status: None   Collection Time: 07/05/19  6:12 PM   Specimen: Nasopharyngeal  Result Value Ref Range Status   MRSA by PCR NEGATIVE NEGATIVE Final    Comment:        The GeneXpert MRSA Assay (FDA approved for NASAL specimens only), is one component of a comprehensive MRSA colonization surveillance program. It is not intended to diagnose MRSA infection nor to guide or monitor treatment for MRSA infections. Performed at [facility], [address], [place], [place] [address]      Radiologic Imaging: CT HEAD WO CONTRAST  Result Date: 07/05/2019 CLINICAL DATA:  Altered mental status. EXAM: CT HEAD WITHOUT CONTRAST TECHNIQUE: Contiguous axial images were obtained from the base of the skull through the vertex without intravenous contrast. COMPARISON:  July 21, 2009 FINDINGS:  Brain: No evidence of acute infarction, hemorrhage, hydrocephalus, extra-axial collection or mass lesion/mass effect. 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 Atrophy and chronic microvascular ischemic changes are noted which have progressed since the prior study in 2011. Vascular: No hyperdense vessel or unexpected calcification. Skull: Normal. Negative for fracture or focal lesion. Sinuses/Orbits: There is mucosal thickening of the left maxillary sinus. The remaining paranasal sinuses and mastoid air cells  are essentially clear. Other: None. IMPRESSION: 1. No acute intracranial abnormality. 2. Chronic findings as detailed above. Electronically Signed   By: Constance Holster M.D.   On: 07/05/2019 21:09   DG Chest Port 1 View  Result Date: 07/05/2019 CLINICAL DATA:  Weakness, evaluate for pneumonia. Additional history provided: Generalized weakness for 2 days, history of COPD, swelling in bilateral legs. EXAM: PORTABLE CHEST 1 VIEW COMPARISON:  Chest CT 12/24/2018, FINDINGS: Heart size within normal limits. Redemonstrated masslike opacity as well as smaller nodular opacities within the right upper lobe. More ill-defined opacities are present within the bilateral lung bases which may reflect atelectasis or pneumonia. No evidence of pleural effusion or pneumothorax. No acute bony abnormality. Overlying cardiac monitoring leads IMPRESSION: Redemonstrated masslike opacity as well as smaller nodular opacities within the right upper lobe. Findings are again suspicious for malignancy. Please refer to chest CT 12/24/2018 for description. Interstitial and more ill-defined opacities within the left greater than right lung bases, which may reflect atelectasis or pneumonia. Aortic atherosclerosis. Electronically Signed   By: Kellie Simmering DO   On: 07/05/2019 15:33   CT Renal Stone Study  Result Date: 07/05/2019 CLINICAL DATA:  Right flank pain. Nephrolithiasis. EXAM: CT ABDOMEN AND PELVIS WITHOUT CONTRAST TECHNIQUE: Multidetector CT imaging of the abdomen and pelvis was performed following the standard protocol without IV contrast. COMPARISON:  None. FINDINGS: Lower chest: No acute findings. Hepatobiliary: No mass visualized on this unenhanced exam. Calcified gallstone seen, however there is no evidence of cholecystitis or biliary dilatation. Pancreas: No mass or inflammatory process visualized on this unenhanced exam. Spleen:  Within normal limits in size. Adrenals/Urinary tract: Normal appearance of adrenal glands. Small  fluid attenuation right renal cyst noted. A partial staghorn calculus is seen in the lower pole collecting system of the right kidney. Several other renal calculi are seen bilaterally measuring up to 6 mm. Mild right hydronephrosis is seen due to a 5 mm calculus in the proximal right ureter. Right renal swelling and mild perinephric stranding also noted. Stomach/Bowel: Small hiatal hernia noted. No evidence of obstruction, inflammatory process, or abnormal fluid collections. Normal appendix visualized. Vascular/Lymphatic: No pathologically enlarged lymph nodes identified. No evidence of abdominal aortic aneurysm. Aortic atherosclerosis incidentally noted. Reproductive:  No mass or other significant abnormality. Other:  None. Musculoskeletal:  No suspicious bone lesions identified. IMPRESSION: 1. Mild right hydronephrosis due to 5 mm proximal right ureteral calculus. 2. 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 3. Cholelithiasis. No radiographic evidence of cholecystitis. 4. Small hiatal hernia. Electronically Signed   By: Marlaine Hind M.D.   On: 07/05/2019 18:22    Impression:  69 y.o. male with sepsis, pyelonephritis and a 5 mm proximal ureteral calculus with obstruction.  Presently hemodynamically stable on no pressors.  Plan:  I have recommended emergent cystoscopy with placement of right ureteral stent.  The patient was somnolent and I am not sure he comprehended our discussion.  I contacted his son; Jontrell Bushong, and discussed the procedure in detail including potential anesthetic risks and worsening sepsis.  The alternative of no treatment with continued antibiotic therapy was discussed but not recommended.  I also discussed percutaneous nephrostomy tube placement in the morning though clinically I would recommend the procedure tonight.  He agreed and gave consent.   07/05/2019, 10:17 PM  [name],  MD

## 2019-07-05 NOTE — H&P (View-Only) (Signed)
Urology Consult  Requesting provider: Darel Hong, NP  Reason for consultation: Sepsis secondary to pyelonephritis, right ureteral calculus  Chief Complaint: N/A  History of Present Illness: Shawn Lindsey is a 69 y.o. male who was transported to the ED today by EMS with a history of generalized weakness and malaise over the preceding 2 days.  Initial EMS assessment patient was found to be hypotensive with low-grade temp.  Recently with daily diarrhea.  Past medical history remarkable for tonsillar cancer, COPD, diabetes, hypertension and prior CVA.  Was hospitalized approximately 5 years ago for pneumonia and respiratory failure and required trach.  - Evaluation in the ED remarkable for sepsis felt secondary to pyelonephritis/UTI -Lactate elevated 5.2; WBC 11.3; UA nitrite positive, >50 WBC, many bacteria; respiratory panel negative influenza/Covid -CT performed later this evening remarkable for a lower pole partial staghorn calculus and a 5 mm right proximal ureteral calculus with mild hydronephrosis.  Parenchymal edema and mild perinephric stranding noted   Past Medical History:  Diagnosis Date  . Anxiety    mild  . Cancer ([place]) 2015   tonsil  . COPD (chronic obstructive pulmonary disease) ([place])   . Diabetes mellitus without complication ([place])   . Elevated cholesterol   . GERD (gastroesophageal reflux disease)   . Hypertension   . Hypothyroidism   . Seizures ([place]) 2015   related to sepsis  . Stroke [facility]) 2007   no peripheral vision on left side of both eyes & left arm gets numb under stress    Past Surgical History:  Procedure Laterality Date  . ELECTROMAGNETIC NAVIGATION BROCHOSCOPY N/A 03/09/2016   Procedure: ELECTROMAGNETIC NAVIGATION BRONCHOSCOPY;  Surgeon: Flora Lipps, MD;  Location: [facility] ORS;  Service: Cardiopulmonary;  Laterality: N/A;  . TONSILLECTOMY    . TRACHEOSTOMY      Home Medications:  No outpatient medications have been marked as taking for the  07/05/19 encounter [facility] Encounter).    Allergies: No Known Allergies  Family History  Problem Relation Age of Onset  . Diabetes Brother   . Dementia Mother     Social History:  reports that he has been smoking. He has a 40.00 pack-year smoking history. He has never used smokeless tobacco. He reports that he does not drink alcohol or use drugs.  ROS: A complete review of systems was performed.  All systems are negative except for pertinent findings as noted.  Physical Exam:  Vital signs in last 24 hours: Temp:  [99 F (37.2 C)-101.3 F (38.5 C)] 99.3 F (37.4 C) (03/05 1810) Pulse Rate:  [77-87] 85 (03/05 1900) Resp:  [16-23] 23 (03/05 1900) BP: (66-132)/(40-110) 105/70 (03/05 1900) SpO2:  [91 %-100 %] 91 % (03/05 1900) Weight:  [125.2 kg-141.5 kg] 125.2 kg (03/05 1830) Constitutional: Somnolent, No acute distress HEENT: [place] AT, moist mucus membranes.  Trachea midline, no masses Cardiovascular: Regular rate and rhythm, no clubbing, cyanosis, or edema. Respiratory: Normal respiratory effort, lungs clear bilaterally GI: Abdomen is soft, nontender, nondistended, no abdominal masses Skin: No rashes, bruises or suspicious lesions Lymph: No cervical or inguinal adenopathy Neurologic: Grossly intact, no focal deficits, moving all 4 extremities Psychiatric: Normal mood and affect   Laboratory Data:  Recent Labs    07/05/19 1452  WBC 11.3*  HGB 14.9  HCT 45.4   Recent Labs    07/05/19 1452  NA 136  K 4.5  CL 101  CO2 21*  GLUCOSE 69*  BUN 33*  CREATININE 2.91*  CALCIUM 9.0  No results for input(s): LABPT, INR in the last 72 hours. No results for input(s): LABURIN in the last 72 hours. Results for orders placed or performed during the hospital encounter of 07/05/19  Respiratory Panel by RT PCR (Flu A&B, Covid) - Nasopharyngeal Swab     Status: None   Collection Time: 07/05/19  4:40 PM   Specimen: Nasopharyngeal Swab  Result Value Ref Range Status   SARS  Coronavirus 2 by RT PCR NEGATIVE NEGATIVE Final    Comment: (NOTE) SARS-CoV-2 target nucleic acids are NOT DETECTED. The SARS-CoV-2 RNA is generally detectable in upper respiratoy specimens during the acute phase of infection. The lowest concentration of SARS-CoV-2 viral copies this assay can detect is 131 copies/mL. A negative result does not preclude SARS-Cov-2 infection and should not be used as the sole basis for treatment or other patient management decisions. A negative result may occur with  improper specimen collection/handling, submission of specimen other than nasopharyngeal swab, presence of viral mutation(s) within the areas targeted by this assay, and inadequate number of viral copies (<131 copies/mL). A negative result must be combined with clinical observations, patient history, and epidemiological information. The expected result is Negative. Fact Sheet for Patients:  PinkCheek.be Fact Sheet for Healthcare Providers:  GravelBags.it This test is not yet ap proved or cleared by the [place] FDA and  has been authorized for detection and/or diagnosis of SARS-CoV-2 by FDA under an Emergency Use Authorization (EUA). This EUA will remain  in effect (meaning this test can be used) for the duration of the COVID-19 declaration under Section 564(b)(1) of the Act, 21 U.S.C. section 360bbb-3(b)(1), unless the authorization is terminated or revoked sooner.    Influenza A by PCR NEGATIVE NEGATIVE Final   Influenza B by PCR NEGATIVE NEGATIVE Final    Comment: (NOTE) The Xpert Xpress SARS-CoV-2/FLU/RSV assay is intended as an aid in  the diagnosis of influenza from Nasopharyngeal swab specimens and  should not be used as a sole basis for treatment. Nasal washings and  aspirates are unacceptable for Xpert Xpress SARS-CoV-2/FLU/RSV  testing. Fact Sheet for Patients: PinkCheek.be Fact Sheet  for Healthcare Providers: GravelBags.it This test is not yet approved or cleared by the [place] FDA and  has been authorized for detection and/or diagnosis of SARS-CoV-2 by  FDA under an Emergency Use Authorization (EUA). This EUA will remain  in effect (meaning this test can be used) for the duration of the  Covid-19 declaration under Section 564(b)(1) of the Act, 21  U.S.C. section 360bbb-3(b)(1), unless the authorization is  terminated or revoked. Performed at [facility], [address] [place], [place] [address]   MRSA PCR Screening     Status: None   Collection Time: 07/05/19  6:12 PM   Specimen: Nasopharyngeal  Result Value Ref Range Status   MRSA by PCR NEGATIVE NEGATIVE Final    Comment:        The GeneXpert MRSA Assay (FDA approved for NASAL specimens only), is one component of a comprehensive MRSA colonization surveillance program. It is not intended to diagnose MRSA infection nor to guide or monitor treatment for MRSA infections. Performed at [facility], [address], [place], [place] [address]      Radiologic Imaging: CT HEAD WO CONTRAST  Result Date: 07/05/2019 CLINICAL DATA:  Altered mental status. EXAM: CT HEAD WITHOUT CONTRAST TECHNIQUE: Contiguous axial images were obtained from the base of the skull through the vertex without intravenous contrast. COMPARISON:  July 21, 2009 FINDINGS:  Brain: No evidence of acute infarction, hemorrhage, hydrocephalus, extra-axial collection or mass lesion/mass effect. 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 Atrophy and chronic microvascular ischemic changes are noted which have progressed since the prior study in 2011. Vascular: No hyperdense vessel or unexpected calcification. Skull: Normal. Negative for fracture or focal lesion. Sinuses/Orbits: There is mucosal thickening of the left maxillary sinus. The remaining paranasal sinuses and mastoid air cells  are essentially clear. Other: None. IMPRESSION: 1. No acute intracranial abnormality. 2. Chronic findings as detailed above. Electronically Signed   By: Constance Holster M.D.   On: 07/05/2019 21:09   DG Chest Port 1 View  Result Date: 07/05/2019 CLINICAL DATA:  Weakness, evaluate for pneumonia. Additional history provided: Generalized weakness for 2 days, history of COPD, swelling in bilateral legs. EXAM: PORTABLE CHEST 1 VIEW COMPARISON:  Chest CT 12/24/2018, FINDINGS: Heart size within normal limits. Redemonstrated masslike opacity as well as smaller nodular opacities within the right upper lobe. More ill-defined opacities are present within the bilateral lung bases which may reflect atelectasis or pneumonia. No evidence of pleural effusion or pneumothorax. No acute bony abnormality. Overlying cardiac monitoring leads IMPRESSION: Redemonstrated masslike opacity as well as smaller nodular opacities within the right upper lobe. Findings are again suspicious for malignancy. Please refer to chest CT 12/24/2018 for description. Interstitial and more ill-defined opacities within the left greater than right lung bases, which may reflect atelectasis or pneumonia. Aortic atherosclerosis. Electronically Signed   By: Kellie Simmering DO   On: 07/05/2019 15:33   CT Renal Stone Study  Result Date: 07/05/2019 CLINICAL DATA:  Right flank pain. Nephrolithiasis. EXAM: CT ABDOMEN AND PELVIS WITHOUT CONTRAST TECHNIQUE: Multidetector CT imaging of the abdomen and pelvis was performed following the standard protocol without IV contrast. COMPARISON:  None. FINDINGS: Lower chest: No acute findings. Hepatobiliary: No mass visualized on this unenhanced exam. Calcified gallstone seen, however there is no evidence of cholecystitis or biliary dilatation. Pancreas: No mass or inflammatory process visualized on this unenhanced exam. Spleen:  Within normal limits in size. Adrenals/Urinary tract: Normal appearance of adrenal glands. Small  fluid attenuation right renal cyst noted. A partial staghorn calculus is seen in the lower pole collecting system of the right kidney. Several other renal calculi are seen bilaterally measuring up to 6 mm. Mild right hydronephrosis is seen due to a 5 mm calculus in the proximal right ureter. Right renal swelling and mild perinephric stranding also noted. Stomach/Bowel: Small hiatal hernia noted. No evidence of obstruction, inflammatory process, or abnormal fluid collections. Normal appendix visualized. Vascular/Lymphatic: No pathologically enlarged lymph nodes identified. No evidence of abdominal aortic aneurysm. Aortic atherosclerosis incidentally noted. Reproductive:  No mass or other significant abnormality. Other:  None. Musculoskeletal:  No suspicious bone lesions identified. IMPRESSION: 1. Mild right hydronephrosis due to 5 mm proximal right ureteral calculus. 2. 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 3. Cholelithiasis. No radiographic evidence of cholecystitis. 4. Small hiatal hernia. Electronically Signed   By: Marlaine Hind M.D.   On: 07/05/2019 18:22    Impression:  69 y.o. male with sepsis, pyelonephritis and a 5 mm proximal ureteral calculus with obstruction.  Presently hemodynamically stable on no pressors.  Plan:  I have recommended emergent cystoscopy with placement of right ureteral stent.  The patient was somnolent and I am not sure he comprehended our discussion.  I contacted his son; Kofi Murrell, and discussed the procedure in detail including potential anesthetic risks and worsening sepsis.  The alternative of no treatment with continued antibiotic therapy was discussed but not recommended.  I also discussed percutaneous nephrostomy tube placement in the morning though clinically I would recommend the procedure tonight.  He agreed and gave consent.   07/05/2019, 10:17 PM  [name],  MD

## 2019-07-05 NOTE — ED Notes (Signed)
Report to [facility] in ICU

## 2019-07-05 NOTE — H&P (Signed)
Name: Shawn Lindsey MRN: [id number] DOB: [birth year]     CONSULTATION DATE: 07/05/2019  REFERRING MD :  Quentin Cornwall CHIEF COMPLAINT: mental status changes    HISTORY OF PRESENT ILLNESS:  69 y.o. male below listed past medical history presents to the ER for evaluation of generalized weakness malaise is progressively worse over the past 2 days.  States his been having daily diarrheal episodes.  Denies any abdominal pain.  No chest pain or shortness of breath.  Was found by EMS to be hypotensive low-grade temperature.  Is not been on any recent antibiotics.  States has been compliant with his medications.  Denies any melena or hematochezia.  ER course Patient  is lethargic, hypotensive, hypoxic Severe septic shock with LA 5 Given 3 L NS Given ABX CODE SEPSIS    PULM HISTORY  COPD Gold stage III with persistent right upper lobe nodular cavitary opacification that seems to have not increased in size based on the previous CT scans in the setting of previous head and neck tonsillar cancer with ongoing tobacco abuse with morbid obesity and deconditioned state with sleep apnea  SYNOPSIS Repeat CT chest on 07/02/2015 RUL cavitary lesions measures 5.0 x 2.2 x 3.6 cm.  Previously 5.4 x 2.5 x 3.5 cm.  Repeat CT chest 01/05/16 shows RUL cavitary mass 4.8x4.1 CM, previous measurement 4.6 x 3.8CM Repeat CT chest 08/2016 no significant change in size of RUL mass  PFT 12/2015   ratio 72%, FEv1 90% DLCo 57% 6MWT WNL  Office Arlyce Harman 01/16/17 Ratio 74% Fev1 92% predicted  Previous history entails Patient has been hospitalized approx 5  Years ago for pneumonia and resp failure requiring ICU care s/p trach Patient subsequently 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 patient sees Dr. Grayland Ormond for cancer care     ONCOLOGY HISTORY 1. 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  He continues to have no evidence of disease.  CT scan results reviewed independently and report as above with no obvious evidence of recurrent or progressive disease. No intervention is needed at this time. No further imaging of his neck is necessary unless there is suspicion for recurrence. Patient states he no longer follows up with ENT.  Return to clinic in 6 months for routine evaluation.   2. Right upper lobe lung mass:  Patient underwent navigational bronchoscopy with biopsy on March 09, 2016 that was negative for malignancy.  CT scan results from October 19, 2017 reviewed independently and report as above with mild progression of lesion.  Patient continues to be asymptomatic and does not have interest in pursuing another biopsy.  No intervention is needed at this time.   PAST MEDICAL HISTORY :   has a past medical history of Anxiety, Cancer ([place]) (2015), COPD (chronic obstructive pulmonary disease) ([place]), Diabetes mellitus without complication ([place]), Elevated cholesterol, GERD (gastroesophageal reflux disease), Hypertension, Hypothyroidism, Seizures ([place]) (2015), and Stroke ([place]) (2007).  has a past surgical history that includes Tonsillectomy; Tracheostomy; and Electormagnetic navigation bronchoscopy (N/A, 03/09/2016). Prior to Admission medications   Medication Sig Start Date End Date Taking? Authorizing Provider  amLODipine (NORVASC) 5 MG tablet Take 5 mg by mouth daily. 02/16/16   [provider]  atorvastatin (LIPITOR) 80 MG tablet Take 80 mg by mouth daily after breakfast.     [provider]  Cholecalciferol 2000 UNITS CAPS Take  2,000 Units by mouth daily.     [provider]  FLUoxetine (PROZAC) 10 MG capsule Take 10 mg by mouth daily.    [provider]  gabapentin (NEURONTIN) 100 MG capsule Take 600 mg by mouth at bedtime.     [provider]  HYDROcodone-acetaminophen (NORCO/VICODIN) 5-325 MG tablet  Take 1 tablet by mouth every 6 (six) hours as needed for moderate pain. 04/15/18   Johnn Hai, PA-C  levETIRAcetam (KEPPRA) 500 MG tablet Take 500 mg by mouth 2 (two) times daily.    [provider]  levothyroxine (SYNTHROID, LEVOTHROID) 125 MCG tablet Take 125 mcg by mouth daily before breakfast.     [provider]  losartan (COZAAR) 100 MG tablet Take 100 mg by mouth at bedtime.    [provider]  magnesium oxide (MAG-OX) 400 MG tablet Take 400 mg by mouth 2 (two) times daily.    [provider]  naproxen (NAPROSYN) 500 MG tablet Take 500 mg by mouth 2 (two) times daily with a meal.    [provider]  Omega-3 Fatty Acids (FISH OIL) 1000 MG CAPS Take 1,000 mg by mouth daily.     [provider]  omeprazole (PRILOSEC) 20 MG capsule Take 20 mg by mouth daily.    [provider]  predniSONE (STERAPRED UNI-PAK 21 TAB) 10 MG (21) TBPK tablet Take by mouth daily.    [provider]  ranitidine (ZANTAC) 150 MG tablet  03/07/16   [provider]  umeclidinium bromide (INCRUSE ELLIPTA) 62.5 MCG/INH AEPB Inhale 1 puff into the lungs daily. 05/31/19   Flora Lipps, MD  VENTOLIN HFA 108 (90 Base) MCG/ACT inhaler Inhale 1-2 puffs into the lungs every 6 (six) hours as needed (for shortness of breath/wheezing.). 12/14/18   Flora Lipps, MD   No Known Allergies  FAMILY HISTORY:  family history includes Dementia in his mother; Diabetes in his brother. SOCIAL HISTORY:  reports that he has been smoking. He has a 40.00 pack-year smoking history. He has never used smokeless tobacco. He reports that he does not drink alcohol or use drugs.   Review of Systems:  Gen:  + fever,  HEENT: Denies blurred vision, double vision, ear pain, eye pain, hearing loss, nose bleeds, sore throat Cardiac:  No dizziness, chest pain or heaviness, chest tightness,edema, No JVD Resp:   No cough, -sputum production, -shortness of breath,-wheezing,  -hemoptysis,  Gi: +stomach pain, +nausea or vomiting, +diarrhea,  Gu:  Denies bladder incontinence, burning urine Ext:   Denies Joint pain, stiffness or swelling Skin: Denies  skin rash, easy bruising or bleeding or hives Endoc:  Denies polyuria, polydipsia , polyphagia or weight change Psych:   Denies depression, insomnia or hallucinations  Other:  All other systems negative   VITAL SIGNS: Temp:  [99 F (37.2 C)-101.3 F (38.5 C)] 101.3 F (38.5 C) (03/05 1547) Pulse Rate:  [77] 77 (03/05 1500) Resp:  [19-21] 19 (03/05 1500) BP: (66-91)/(40-50) 91/50 (03/05 1500) SpO2:  [96 %-100 %] 96 % (03/05 1500) Weight:  [141.5 kg] 141.5 kg (03/05 1448)   No intake/output data recorded. No intake/output data recorded.   SpO2: 96 % O2 Flow Rate (L/min): 4 L/min   Physical Examination:  GENERAL:critically ill appearing,  HEAD: Normocephalic, atraumatic.  EYES: Pupils equal, round, reactive to light.  No scleral icterus.  MOUTH: Moist mucosal membrane. NECK: Supple. No JVD.  PULMONARY:CTA b/l  CARDIOVASCULAR: S1 and S2. Regular rate and rhythm. No murmurs, rubs, or  gallops.  GASTROINTESTINAL: Soft, nontender, -distended.  Positive bowel sounds.  MUSCULOSKELETAL: No swelling, clubbing, or edema.  NEUROLOGIC: alert and awake, follows commands SKIN:intact,warm,dry    MEDICATIONS: I have reviewed all medications and confirmed regimen as documented   CULTURE RESULTS   No results found for this or any previous visit (from the past 240 hour(s)).        IMAGING    DG Chest Port 1 View  Result Date: 07/05/2019 CLINICAL DATA:  Weakness, evaluate for pneumonia. Additional history provided: Generalized weakness for 2 days, history of COPD, swelling in bilateral legs. EXAM: PORTABLE CHEST 1 VIEW COMPARISON:  Chest CT 12/24/2018, FINDINGS: Heart size within normal limits. Redemonstrated masslike opacity as well as smaller nodular opacities within the right upper lobe. More ill-defined  opacities are present within the bilateral lung bases which may reflect atelectasis or pneumonia. No evidence of pleural effusion or pneumothorax. No acute bony abnormality. Overlying cardiac monitoring leads IMPRESSION: Redemonstrated masslike opacity as well as smaller nodular opacities within the right upper lobe. Findings are again suspicious for malignancy. Please refer to chest CT 12/24/2018 for description. Interstitial and more ill-defined opacities within the left greater than right lung bases, which may reflect atelectasis or pneumonia. Aortic atherosclerosis. Electronically Signed   By: Kellie Simmering DO   On: 07/05/2019 15:33         ASSESSMENT AND PLAN SYNOPSIS   Severe ACUTE Hypoxic and Hypercapnic Respiratory Failure from severe septic shock from UTIa dn PYELONEPHRITIS, possible pneumonia Oxygen as needed High risk for intubation and cardiac arrest  ACUTE KIDNEY INJURY/Renal Failure -follow chem 7 -follow UO -continue Foley Catheter-assess need -Avoid nephrotoxic agents   NEUROLOGY encephalopathy from sepsis   SHOCK-SEPSIS/HYPOVOLUMIC -use vasopressors to keep MAP>65 if needed -follow ABG and LA -follow up cultures -emperic ABX -consider stress dose steroids -aggressive IV fluid resuscitation  CARDIAC ICU monitoring  ID-UTI/PYELONEPHRITIS/PNEUMONIA -continue IV abx as prescibed -follow up cultures Consider CT chest CT abd pelvis pending  GI GI PROPHYLAXIS as indicated  NUTRITIONAL STATUS DIET-->NPO Constipation protocol as indicated   ENDO - will use ICU hypoglycemic\Hyperglycemia protocol if needed    ELECTROLYTES -follow labs as needed -replace as needed -pharmacy consultation and following   DVT/GI PRX ordered TRANSFUSIONS AS NEEDED MONITOR FSBS ASSESS the need for LABS    Critical Care Time devoted to patient care services described in this note is 55 minutes.   Overall, patient is critically ill, prognosis is guarded.  Patient  with Multiorgan failure and at high risk for cardiac arrest and death.    Corrin Parker, M.D.  [facility]  Medical Director [place] Director [facility] Cardio-Pulmonary Department

## 2019-07-05 NOTE — Sepsis Progress Note (Signed)
Notified provider of need to order further fluid bolus . Fluid resuscitation need for 141.5 kg body weight is 4245 ml. If ideal body weight is used, the IBW used my MD needs to be in MD note. 3rd liter is ordered and being started

## 2019-07-05 NOTE — ED Triage Notes (Signed)
Pt to ED via [facility] from home for generalized weakness x 2 days. Pt has hx/o COPD, does not wear O2 at home 90% on 6 liters of O2. Pt has swelling in bilateral legs.    Pt is in NAD this time.

## 2019-07-05 NOTE — Progress Notes (Signed)
PHARMACY -  BRIEF ANTIBIOTIC NOTE   Pharmacy has received consult(s) for vancomycin and cefepime from an ED provider.  The patient's profile has been reviewed for ht/wt/allergies/indication/available labs.    One time order(s) placed for vanc 1 g + cefepime 2 g  Further antibiotics/pharmacy consults should be ordered by admitting physician if indicated.                       Thank you,  Tawnya Crook, PharmD 07/05/2019  3:47 PM

## 2019-07-05 NOTE — Anesthesia Procedure Notes (Signed)
Arterial Line Insertion Start/End3/08/2019 11:05 PM, 07/05/2019 11:07 PM Performed by: Tera Mater, MD, anesthesiologist  Patient location: OR. Preanesthetic checklist: patient identified, IV checked, site marked, risks and benefits discussed, surgical consent, monitors and equipment checked, pre-op evaluation, timeout performed and anesthesia consent radial was placed Catheter size: 20 Fr Hand hygiene performed   Attempts: 1 Procedure performed without using ultrasound guided technique. Following insertion, dressing applied. Post procedure assessment: normal and unchanged  Patient tolerated the procedure well with no immediate complications.

## 2019-07-05 NOTE — H&P (View-Only) (Signed)
Urology Consult  Requesting provider: Darel Hong, NP  Reason for consultation: Sepsis secondary to pyelonephritis, right ureteral calculus  Chief Complaint: N/A  History of Present Illness: Shawn Lindsey is a 69 y.o. male who was transported to the ED today by EMS with a history of generalized weakness and malaise over the preceding 2 days.  Initial EMS assessment patient was found to be hypotensive with low-grade temp.  Recently with daily diarrhea.  Past medical history remarkable for tonsillar cancer, COPD, diabetes, hypertension and prior CVA.  Was hospitalized approximately 5 years ago for pneumonia and respiratory failure and required trach.  - Evaluation in the ED remarkable for sepsis felt secondary to pyelonephritis/UTI -Lactate elevated 5.2; WBC 11.3; UA nitrite positive, >50 WBC, many bacteria; respiratory panel negative influenza/Covid -CT performed later this evening remarkable for a lower pole partial staghorn calculus and a 5 mm right proximal ureteral calculus with mild hydronephrosis.  Parenchymal edema and mild perinephric stranding noted   Past Medical History:  Diagnosis Date  . Anxiety    mild  . Cancer ([place]) 2015   tonsil  . COPD (chronic obstructive pulmonary disease) ([place])   . Diabetes mellitus without complication ([place])   . Elevated cholesterol   . GERD (gastroesophageal reflux disease)   . Hypertension   . Hypothyroidism   . Seizures ([place]) 2015   related to sepsis  . Stroke [facility]) 2007   no peripheral vision on left side of both eyes & left arm gets numb under stress    Past Surgical History:  Procedure Laterality Date  . ELECTROMAGNETIC NAVIGATION BROCHOSCOPY N/A 03/09/2016   Procedure: ELECTROMAGNETIC NAVIGATION BRONCHOSCOPY;  Surgeon: Flora Lipps, MD;  Location: [facility] ORS;  Service: Cardiopulmonary;  Laterality: N/A;  . TONSILLECTOMY    . TRACHEOSTOMY      Home Medications:  No outpatient medications have been marked as taking for the  07/05/19 encounter [facility] Encounter).    Allergies: No Known Allergies  Family History  Problem Relation Age of Onset  . Diabetes Brother   . Dementia Mother     Social History:  reports that he has been smoking. He has a 40.00 pack-year smoking history. He has never used smokeless tobacco. He reports that he does not drink alcohol or use drugs.  ROS: A complete review of systems was performed.  All systems are negative except for pertinent findings as noted.  Physical Exam:  Vital signs in last 24 hours: Temp:  [99 F (37.2 C)-101.3 F (38.5 C)] 99.3 F (37.4 C) (03/05 1810) Pulse Rate:  [77-87] 85 (03/05 1900) Resp:  [16-23] 23 (03/05 1900) BP: (66-132)/(40-110) 105/70 (03/05 1900) SpO2:  [91 %-100 %] 91 % (03/05 1900) Weight:  [125.2 kg-141.5 kg] 125.2 kg (03/05 1830) Constitutional: Somnolent, No acute distress HEENT: [place] AT, moist mucus membranes.  Trachea midline, no masses Cardiovascular: Regular rate and rhythm, no clubbing, cyanosis, or edema. Respiratory: Normal respiratory effort, lungs clear bilaterally GI: Abdomen is soft, nontender, nondistended, no abdominal masses Skin: No rashes, bruises or suspicious lesions Lymph: No cervical or inguinal adenopathy Neurologic: Grossly intact, no focal deficits, moving all 4 extremities Psychiatric: Normal mood and affect   Laboratory Data:  Recent Labs    07/05/19 1452  WBC 11.3*  HGB 14.9  HCT 45.4   Recent Labs    07/05/19 1452  NA 136  K 4.5  CL 101  CO2 21*  GLUCOSE 69*  BUN 33*  CREATININE 2.91*  CALCIUM 9.0  No results for input(s): LABPT, INR in the last 72 hours. No results for input(s): LABURIN in the last 72 hours. Results for orders placed or performed during the hospital encounter of 07/05/19  Respiratory Panel by RT PCR (Flu A&B, Covid) - Nasopharyngeal Swab     Status: None   Collection Time: 07/05/19  4:40 PM   Specimen: Nasopharyngeal Swab  Result Value Ref Range Status   SARS  Coronavirus 2 by RT PCR NEGATIVE NEGATIVE Final    Comment: (NOTE) SARS-CoV-2 target nucleic acids are NOT DETECTED. The SARS-CoV-2 RNA is generally detectable in upper respiratoy specimens during the acute phase of infection. The lowest concentration of SARS-CoV-2 viral copies this assay can detect is 131 copies/mL. A negative result does not preclude SARS-Cov-2 infection and should not be used as the sole basis for treatment or other patient management decisions. A negative result may occur with  improper specimen collection/handling, submission of specimen other than nasopharyngeal swab, presence of viral mutation(s) within the areas targeted by this assay, and inadequate number of viral copies (<131 copies/mL). A negative result must be combined with clinical observations, patient history, and epidemiological information. The expected result is Negative. Fact Sheet for Patients:  PinkCheek.be Fact Sheet for Healthcare Providers:  GravelBags.it This test is not yet ap proved or cleared by the [place] FDA and  has been authorized for detection and/or diagnosis of SARS-CoV-2 by FDA under an Emergency Use Authorization (EUA). This EUA will remain  in effect (meaning this test can be used) for the duration of the COVID-19 declaration under Section 564(b)(1) of the Act, 21 U.S.C. section 360bbb-3(b)(1), unless the authorization is terminated or revoked sooner.    Influenza A by PCR NEGATIVE NEGATIVE Final   Influenza B by PCR NEGATIVE NEGATIVE Final    Comment: (NOTE) The Xpert Xpress SARS-CoV-2/FLU/RSV assay is intended as an aid in  the diagnosis of influenza from Nasopharyngeal swab specimens and  should not be used as a sole basis for treatment. Nasal washings and  aspirates are unacceptable for Xpert Xpress SARS-CoV-2/FLU/RSV  testing. Fact Sheet for Patients: PinkCheek.be Fact Sheet  for Healthcare Providers: GravelBags.it This test is not yet approved or cleared by the [place] FDA and  has been authorized for detection and/or diagnosis of SARS-CoV-2 by  FDA under an Emergency Use Authorization (EUA). This EUA will remain  in effect (meaning this test can be used) for the duration of the  Covid-19 declaration under Section 564(b)(1) of the Act, 21  U.S.C. section 360bbb-3(b)(1), unless the authorization is  terminated or revoked. Performed at [facility], [address] [place], [place] [address]   MRSA PCR Screening     Status: None   Collection Time: 07/05/19  6:12 PM   Specimen: Nasopharyngeal  Result Value Ref Range Status   MRSA by PCR NEGATIVE NEGATIVE Final    Comment:        The GeneXpert MRSA Assay (FDA approved for NASAL specimens only), is one component of a comprehensive MRSA colonization surveillance program. It is not intended to diagnose MRSA infection nor to guide or monitor treatment for MRSA infections. Performed at [name], [address] [place], [place] [address]      Radiologic Imaging: CT HEAD WO CONTRAST  Result Date: 07/05/2019 CLINICAL DATA:  Altered mental status. EXAM: CT HEAD WITHOUT CONTRAST TECHNIQUE: Contiguous axial images were obtained from the base of the skull through the vertex without intravenous contrast. COMPARISON:  July 21, 2009 FINDINGS:  Brain: No evidence of acute infarction, hemorrhage, hydrocephalus, extra-axial collection or mass lesion/mass effect. 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 Atrophy and chronic microvascular ischemic changes are noted which have progressed since the prior study in 2011. Vascular: No hyperdense vessel or unexpected calcification. Skull: Normal. Negative for fracture or focal lesion. Sinuses/Orbits: There is mucosal thickening of the left maxillary sinus. The remaining paranasal sinuses and mastoid air cells  are essentially clear. Other: None. IMPRESSION: 1. No acute intracranial abnormality. 2. Chronic findings as detailed above. Electronically Signed   By: Constance Holster M.D.   On: 07/05/2019 21:09   DG Chest Port 1 View  Result Date: 07/05/2019 CLINICAL DATA:  Weakness, evaluate for pneumonia. Additional history provided: Generalized weakness for 2 days, history of COPD, swelling in bilateral legs. EXAM: PORTABLE CHEST 1 VIEW COMPARISON:  Chest CT 12/24/2018, FINDINGS: Heart size within normal limits. Redemonstrated masslike opacity as well as smaller nodular opacities within the right upper lobe. More ill-defined opacities are present within the bilateral lung bases which may reflect atelectasis or pneumonia. No evidence of pleural effusion or pneumothorax. No acute bony abnormality. Overlying cardiac monitoring leads IMPRESSION: Redemonstrated masslike opacity as well as smaller nodular opacities within the right upper lobe. Findings are again suspicious for malignancy. Please refer to chest CT 12/24/2018 for description. Interstitial and more ill-defined opacities within the left greater than right lung bases, which may reflect atelectasis or pneumonia. Aortic atherosclerosis. Electronically Signed   By: Kellie Simmering DO   On: 07/05/2019 15:33   CT Renal Stone Study  Result Date: 07/05/2019 CLINICAL DATA:  Right flank pain. Nephrolithiasis. EXAM: CT ABDOMEN AND PELVIS WITHOUT CONTRAST TECHNIQUE: Multidetector CT imaging of the abdomen and pelvis was performed following the standard protocol without IV contrast. COMPARISON:  None. FINDINGS: Lower chest: No acute findings. Hepatobiliary: No mass visualized on this unenhanced exam. Calcified gallstone seen, however there is no evidence of cholecystitis or biliary dilatation. Pancreas: No mass or inflammatory process visualized on this unenhanced exam. Spleen:  Within normal limits in size. Adrenals/Urinary tract: Normal appearance of adrenal glands. Small  fluid attenuation right renal cyst noted. A partial staghorn calculus is seen in the lower pole collecting system of the right kidney. Several other renal calculi are seen bilaterally measuring up to 6 mm. Mild right hydronephrosis is seen due to a 5 mm calculus in the proximal right ureter. Right renal swelling and mild perinephric stranding also noted. Stomach/Bowel: Small hiatal hernia noted. No evidence of obstruction, inflammatory process, or abnormal fluid collections. Normal appendix visualized. Vascular/Lymphatic: No pathologically enlarged lymph nodes identified. No evidence of abdominal aortic aneurysm. Aortic atherosclerosis incidentally noted. Reproductive:  No mass or other significant abnormality. Other:  None. Musculoskeletal:  No suspicious bone lesions identified. IMPRESSION: 1. Mild right hydronephrosis due to 5 mm proximal right ureteral calculus. 2. 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 3. Cholelithiasis. No radiographic evidence of cholecystitis. 4. Small hiatal hernia. Electronically Signed   By: Marlaine Hind M.D.   On: 07/05/2019 18:22    Impression:  69 y.o. male with sepsis, pyelonephritis and a 5 mm proximal ureteral calculus with obstruction.  Presently hemodynamically stable on no pressors.  Plan:  I have recommended emergent cystoscopy with placement of right ureteral stent.  The patient was somnolent and I am not sure he comprehended our discussion.  I contacted his son; Kofi Murrell, and discussed the procedure in detail including potential anesthetic risks and worsening sepsis.  The alternative of no treatment with continued antibiotic therapy was discussed but not recommended.  I also discussed percutaneous nephrostomy tube placement in the morning though clinically I would recommend the procedure tonight.  He agreed and gave consent.   07/05/2019, 10:17 PM  [name],  MD

## 2019-07-05 NOTE — Progress Notes (Signed)
CODE SEPSIS - PHARMACY COMMUNICATION  **Broad Spectrum Antibiotics should be administered within 1 hour of Sepsis diagnosis**  Time Code Sepsis Called/Page Received: 16:34  Antibiotics Ordered: Cefepime and Vancomycin  Time of 1st antibiotic administration: Cefepime 16:15  Additional action taken by pharmacy:    If necessary, Name of Provider/Nurse Contacted:      Vira Blanco ,PharmD Clinical Pharmacist  07/05/2019  4:48 PM

## 2019-07-05 NOTE — Anesthesia Preprocedure Evaluation (Signed)
Anesthesia Evaluation  Patient identified by MRN, date of birth, ID band Patient awake    Reviewed: Allergy & Precautions, H&P , NPO status , Patient's Chart, lab work & pertinent test results  Airway Mallampati: III  TM Distance: >3 FB Neck ROM: full    Dental  (+) Chipped   Pulmonary sleep apnea and Continuous Positive Airway Pressure Ventilation , COPD (wears oxygen "a couple nights a week"),  COPD inhaler and oxygen dependent, Current Smoker,     + decreased breath sounds      Cardiovascular hypertension, (-) Past MI and (-) Cardiac Stents (-) dysrhythmias  Rhythm:regular Rate:Tachycardia     Neuro/Psych Anxiety CVA, Residual Symptoms    GI/Hepatic negative GI ROS, Neg liver ROS,   Endo/Other  diabetesHypothyroidism   Renal/GU Sepsis from renal stone     Musculoskeletal   Abdominal   Peds  Hematology negative hematology ROS (+)   Anesthesia Other Findings Past Medical History: No date: Anxiety     Comment:  mild 2015: Cancer ([place])     Comment:  tonsil No date: COPD (chronic obstructive pulmonary disease) ([facility]) No date: Diabetes mellitus without complication ([facility]) No date: Elevated cholesterol No date: GERD (gastroesophageal reflux disease) No date: Hypertension No date: Hypothyroidism 2015: Seizures ([place])     Comment:  related to sepsis 2007: Stroke [facility])     Comment:  no peripheral vision on left side of both eyes & left               arm gets numb under stress  Past Surgical History: 03/09/2016: ELECTROMAGNETIC NAVIGATION BROCHOSCOPY; N/A     Comment:  Procedure: ELECTROMAGNETIC NAVIGATION BRONCHOSCOPY;                Surgeon: Flora Lipps, MD;  Location: [facility] ORS;  Service:               Cardiopulmonary;  Laterality: N/A; No date: TONSILLECTOMY No date: TRACHEOSTOMY  BMI    Body Mass Index: 39.60 kg/m      Reproductive/Obstetrics negative OB ROS                              Anesthesia Physical Anesthesia Plan  ASA: III and emergent  Anesthesia Plan: General ETT, Rapid Sequence and Cricoid Pressure   Post-op Pain Management:    Induction:   PONV Risk Score and Plan: Ondansetron, Dexamethasone and Treatment may vary due to age or medical condition  Airway Management Planned: Video Laryngoscope Planned  Additional Equipment:   Intra-op Plan:   Post-operative Plan: Possible Post-op intubation/ventilation  Informed Consent: I have reviewed the patients History and Physical, chart, labs and discussed the procedure including the risks, benefits and alternatives for the proposed anesthesia with the patient or authorized representative who has indicated his/her understanding and acceptance.     Dental Advisory Given  Plan Discussed with: Anesthesiologist  Anesthesia Plan Comments:         Anesthesia Quick Evaluation

## 2019-07-05 NOTE — ED Notes (Signed)
Dr. Mortimer Fries informed of glucose of 68, verbal order to give 1 full amp of D50 IV stat

## 2019-07-05 NOTE — Op Note (Signed)
Preoperative diagnosis:  1. Right proximal ureteral calculus 2. Pyelonephritis 3. Sepsis from a urinary source  Postoperative diagnosis:  1. Same  Procedure:  1. Cystoscopy 2. Right ureteral stent placement (6FR/26 cm) 3. Right retrograde pyelography with interpretation   Surgeon: Nicki Reaper C. Raiden Yearwood, M.D.  Anesthesia: General  Complications: None  Intraoperative findings:  1.  Right retrograde pyelogram with mild right hydronephrosis and hydroureter to filling defect right proximal ureter consistent with known calculus  EBL: Minimal  Specimens: Urine right renal pelvis for C&S  Indication: Shawn Lindsey is a 69 y.o. male admitted to the ICU with septic shock secondary to pyelonephritis and on subsequent CT found to have a 5 mm right proximal ureteral calculus for emergent right ureteral stent placement. After reviewing the management options for treatment, he elected to proceed with the above surgical procedure(s). We have discussed the potential benefits and risks of the procedure, side effects of the proposed treatment, the likelihood of the patient achieving the goals of the procedure, and any potential problems that might occur during the procedure or recuperation. Informed consent has been obtained.  Description of procedure:  He was noted to be hypotensive in the staging area.  The patient was taken to the operating room where an arterial line was placed and general anesthesia was induced.  The patient was placed in the dorsal lithotomy position, prepped and draped in the usual sterile fashion, and preoperative antibiotics were administered.  He is currently on antibiotic for sepsis  A 21 French cystoscope was lubricated and passed under direct vision.  The patient's urethra was examined and was normal without stricture.  The prostate was remarkable for mild lateral lobe enlargement with mild bladder neck elevation.  Concentrated urine was noted in the bladder.  The bladder was  then systematically examined in its entirety. There was no evidence for any bladder tumors, stones.  Scattered mucosal erythema was noted  Attention then turned to the right ureteral orifice and a 0.038 Sensor wire was placed through the cystoscope into the orifice and advanced to the renal pelvis under fluoroscopic guidance without difficulty.  A 5 French open-ended ureteral catheter was then advanced over the wire to the area of the renal pelvis.  The guidewire was removed and approximately 5 cc of purulent urine was aspirated and sent for culture.  Omnipaque contrast was injected through the ureteral catheter and a retrograde pyelogram was performed with findings as dictated above.  The guidewire was replaced and the ureteral catheter was removed.  A 6 French/26 cm Contour ureteral stent was then placed over the wire.  The proximal stent curl was in an upper pole calyx and the distal end of the stent was well positioned in the bladder.  After stent placement efflux of purulent urine was noted through the stent.  The bladder was emptied and the cystoscope was removed.  A 16 French Foley catheter was placed to gravity drainage.  The patient appeared to tolerate the procedure well and without complications.  It was elected by anesthesia to transport the patient to the ICU intubated.    [name], MD

## 2019-07-05 NOTE — ED Notes (Signed)
Redness to bottom. Barrier room applied. Pt tolerated well.

## 2019-07-06 ENCOUNTER — Inpatient Hospital Stay: Payer: Medicare HMO

## 2019-07-06 DIAGNOSIS — J9601 Acute respiratory failure with hypoxia: Secondary | ICD-10-CM

## 2019-07-06 LAB — BLOOD GAS, ARTERIAL
Acid-base deficit: 5.9 mmol/L — ABNORMAL HIGH (ref 0.0–2.0)
Acid-base deficit: 6.8 mmol/L — ABNORMAL HIGH (ref 0.0–2.0)
Bicarbonate: 21.3 mmol/L (ref 20.0–28.0)
Bicarbonate: 21.9 mmol/L (ref 20.0–28.0)
FIO2: 0.7
FIO2: 0.9
MECHVT: 500 mL
MECHVT: 500 mL
O2 Saturation: 98.5 %
O2 Saturation: 99.3 %
PEEP: 5 cmH2O
Patient temperature: 37
Patient temperature: 37
RATE: 16 resp/min
RATE: 20 resp/min
pCO2 arterial: 51 mmHg — ABNORMAL HIGH (ref 32.0–48.0)
pCO2 arterial: 52 mmHg — ABNORMAL HIGH (ref 32.0–48.0)
pH, Arterial: 7.22 — ABNORMAL LOW (ref 7.350–7.450)
pH, Arterial: 7.24 — ABNORMAL LOW (ref 7.350–7.450)
pO2, Arterial: 136 mmHg — ABNORMAL HIGH (ref 83.0–108.0)
pO2, Arterial: 169 mmHg — ABNORMAL HIGH (ref 83.0–108.0)

## 2019-07-06 LAB — BLOOD CULTURE ID PANEL (REFLEXED)

## 2019-07-06 LAB — CBC
HCT: 44.1 % (ref 39.0–52.0)
Hemoglobin: 14.5 g/dL (ref 13.0–17.0)
MCH: 30.3 pg (ref 26.0–34.0)
MCHC: 32.9 g/dL (ref 30.0–36.0)
MCV: 92.1 fL (ref 80.0–100.0)
Platelets: 102 10*3/uL — ABNORMAL LOW (ref 150–400)
RBC: 4.79 MIL/uL (ref 4.22–5.81)
RDW: 13.9 % (ref 11.5–15.5)
WBC: 23.5 10*3/uL — ABNORMAL HIGH (ref 4.0–10.5)
nRBC: 0 % (ref 0.0–0.2)

## 2019-07-06 LAB — GLUCOSE, CAPILLARY
Glucose-Capillary: 102 mg/dL — ABNORMAL HIGH (ref 70–99)
Glucose-Capillary: 121 mg/dL — ABNORMAL HIGH (ref 70–99)
Glucose-Capillary: 121 mg/dL — ABNORMAL HIGH (ref 70–99)
Glucose-Capillary: 138 mg/dL — ABNORMAL HIGH (ref 70–99)
Glucose-Capillary: 140 mg/dL — ABNORMAL HIGH (ref 70–99)
Glucose-Capillary: 141 mg/dL — ABNORMAL HIGH (ref 70–99)
Glucose-Capillary: 75 mg/dL (ref 70–99)
Glucose-Capillary: 83 mg/dL (ref 70–99)
Glucose-Capillary: 89 mg/dL (ref 70–99)

## 2019-07-06 LAB — BASIC METABOLIC PANEL
Anion gap: 9 (ref 5–15)
BUN: 32 mg/dL — ABNORMAL HIGH (ref 8–23)
CO2: 22 mmol/L (ref 22–32)
Calcium: 8 mg/dL — ABNORMAL LOW (ref 8.9–10.3)
Chloride: 108 mmol/L (ref 98–111)
Creatinine, Ser: 2.21 mg/dL — ABNORMAL HIGH (ref 0.61–1.24)
GFR calc Af Amer: 34 mL/min — ABNORMAL LOW (ref >60)
GFR calc non Af Amer: 29 mL/min — ABNORMAL LOW (ref >60)
Glucose, Bld: 103 mg/dL — ABNORMAL HIGH (ref 70–99)
Potassium: 4.1 mmol/L (ref 3.5–5.1)
Sodium: 139 mmol/L (ref 135–145)

## 2019-07-06 LAB — LACTIC ACID, PLASMA: Lactic Acid, Venous: 1.9 mmol/L (ref 0.5–1.9)

## 2019-07-06 LAB — PROCALCITONIN: Procalcitonin: 9.21 ng/mL

## 2019-07-06 LAB — CK: Total CK: 823 U/L — ABNORMAL HIGH (ref 49–397)

## 2019-07-06 MED ORDER — HYDROCORTISONE NA SUCCINATE PF 100 MG IJ SOLR
50.0000 mg | Freq: Four times a day (QID) | INTRAMUSCULAR | Status: DC
  Administered 2019-07-06 – 2019-07-08 (×10): 50 mg via INTRAVENOUS
  Filled 2019-07-06 (×9): qty 2

## 2019-07-06 MED ORDER — SODIUM CHLORIDE 0.9 % IV SOLN: 2.0000 g | INTRAVENOUS | Status: DC

## 2019-07-06 MED ORDER — LACTATED RINGERS IV BOLUS
1000.0000 mL | Freq: Once | INTRAVENOUS | Status: AC
  Administered 2019-07-06: 1000 mL via INTRAVENOUS

## 2019-07-06 MED ORDER — CHLORHEXIDINE GLUCONATE 0.12% ORAL RINSE (MEDLINE KIT)
15.0000 mL | Freq: Two times a day (BID) | OROMUCOSAL | Status: DC
  Administered 2019-07-06: 15 mL via OROMUCOSAL

## 2019-07-06 MED ORDER — VANCOMYCIN HCL 1500 MG/300ML IV SOLN
1500.0000 mg | Freq: Once | INTRAVENOUS | Status: AC
  Administered 2019-07-06: 1500 mg via INTRAVENOUS
  Filled 2019-07-06: qty 300

## 2019-07-06 MED ORDER — NOREPINEPHRINE 16 MG/250ML-% IV SOLN
0.0000 ug/min | INTRAVENOUS | Status: DC
  Administered 2019-07-06: 15 ug/min via INTRAVENOUS
  Filled 2019-07-06 (×2): qty 250

## 2019-07-06 MED ORDER — PROPOFOL 500 MG/50ML IV EMUL
INTRAVENOUS | Status: DC | PRN
  Administered 2019-07-05: 60 ug/kg/min via INTRAVENOUS

## 2019-07-06 MED ORDER — VASOPRESSIN 20 UNIT/ML IV SOLN
0.0300 [IU]/min | INTRAVENOUS | Status: DC
  Administered 2019-07-06: 0.03 [IU]/min via INTRAVENOUS
  Filled 2019-07-06: qty 2

## 2019-07-06 MED ORDER — DEXTROSE 50 % IV SOLN
25.0000 mL | Freq: Once | INTRAVENOUS | Status: AC
  Administered 2019-07-06: 25 mL via INTRAVENOUS

## 2019-07-06 MED ORDER — MIDAZOLAM HCL 2 MG/2ML IJ SOLN
2.0000 mg | Freq: Once | INTRAMUSCULAR | Status: AC
  Administered 2019-07-06: 2 mg via INTRAVENOUS

## 2019-07-06 MED ORDER — ORAL CARE MOUTH RINSE
15.0000 mL | OROMUCOSAL | Status: DC
  Administered 2019-07-06 – 2019-07-07 (×17): 15 mL via OROMUCOSAL

## 2019-07-06 MED ORDER — DEXMEDETOMIDINE HCL IN NACL 400 MCG/100ML IV SOLN
0.4000 ug/kg/h | INTRAVENOUS | Status: DC
  Administered 2019-07-07: 0.4 ug/kg/h via INTRAVENOUS
  Administered 2019-07-07: 0.8 ug/kg/h via INTRAVENOUS
  Administered 2019-07-08: 0.2 ug/kg/h via INTRAVENOUS
  Administered 2019-07-08: 0.5 ug/kg/h via INTRAVENOUS
  Administered 2019-07-08: 0.4 ug/kg/h via INTRAVENOUS
  Filled 2019-07-06 (×5): qty 100

## 2019-07-06 MED ORDER — SODIUM CHLORIDE 0.9 % IV SOLN
2.0000 g | INTRAVENOUS | Status: DC
  Administered 2019-07-06 – 2019-07-07 (×2): 2 g via INTRAVENOUS
  Filled 2019-07-06 (×2): qty 2

## 2019-07-06 MED ORDER — VANCOMYCIN VARIABLE DOSE PER UNSTABLE RENAL FUNCTION (PHARMACIST DOSING): Status: DC

## 2019-07-06 MED ORDER — DEXTROSE 50 % IV SOLN
INTRAVENOUS | Status: AC
  Filled 2019-07-06: qty 50

## 2019-07-06 MED ORDER — CHLORHEXIDINE GLUCONATE CLOTH 2 % EX PADS
6.0000 | MEDICATED_PAD | Freq: Every day | CUTANEOUS | Status: DC
  Administered 2019-07-06 – 2019-07-12 (×5): 6 via TOPICAL

## 2019-07-06 NOTE — Transfer of Care (Signed)
Immediate Anesthesia Transfer of Care Note  Patient: Shawn Lindsey  Procedure(s) Performed: CYSTOSCOPY WITH STENT PLACEMENT (Right )  Patient Location: ICU  Anesthesia Type:General  Level of Consciousness: Patient remains intubated per anesthesia plan  Airway & Oxygen Therapy: Patient remains intubated per anesthesia plan and Patient placed on Ventilator (see vital sign flow sheet for setting)  Post-op Assessment: Report given to RN and Post -op Vital signs reviewed and stable  Post vital signs: Reviewed and stable  Last Vitals:  Vitals Value Taken Time  BP    Temp    Pulse 92 07/06/19 0014  Resp 15 07/06/19 0014  SpO2 95 % 07/06/19 0014  Vitals shown include unvalidated device data.  Last Pain:  Vitals:   07/05/19 1810  TempSrc: Oral  PainSc: 0-No pain         Complications: No apparent anesthesia complications

## 2019-07-06 NOTE — Progress Notes (Signed)
Shift summary:  - Patient is now intubated, sedated. - Upon AM assessment, Arterial line was un-secure and the catheter was exposed. Line removed and dressing placed.

## 2019-07-06 NOTE — Procedures (Signed)
Central Venous Catheter Insertion Procedure Note Shawn Lindsey [id number] Nov 29, 1950  Procedure: Insertion of Central Venous Catheter Indications: Assessment of intravascular volume, Drug and/or fluid administration and Frequent blood sampling  Procedure Details Consent: Unable to obtain consent because of emergent medical necessity. Time Out: Verified patient identification, verified procedure, site/side was marked, verified correct patient position, special equipment/implants available, medications/allergies/relevent history reviewed, required imaging and test results available.  Performed  Maximum sterile technique was used including antiseptics, cap, gloves, gown, hand hygiene, mask and sheet. Skin prep: Chlorhexidine; local anesthetic administered A antimicrobial bonded/coated triple lumen catheter was placed in the right internal jugular vein using the Seldinger technique.  Evaluation Blood flow good Complications: No apparent complications Patient did tolerate procedure well. Chest X-ray ordered to verify placement.  CXR: pending.    Procedure was performed using Ultrasound for direct visualization of cannulization of Right IJ.  Line was secured at the 17 cm mark.    Darel Hong, AGACNP-BC [facility] Pager: [phone]    Shawn Lindsey 07/06/2019, 1:46 AM

## 2019-07-06 NOTE — Progress Notes (Signed)
Urology Consult Follow Up  Subjective: Remains intubated/sedated  Anti-infectives: Anti-infectives (From admission, onward)   Start     Dose/Rate Route Frequency Ordered Stop   07/07/19 0600  cefTRIAXone (ROCEPHIN) 2 g in sodium chloride 0.9 % 100 mL IVPB  Status:  Discontinued     2 g 200 mL/hr over 30 Minutes Intravenous Every 24 hours 07/06/19 0518 07/06/19 0529   07/06/19 0600  cefTRIAXone (ROCEPHIN) 2 g in sodium chloride 0.9 % 100 mL IVPB     2 g 200 mL/hr over 30 Minutes Intravenous Every 24 hours 07/06/19 0529     07/06/19 0215  vancomycin (VANCOREADY) IVPB 1500 mg/300 mL     1,500 mg 150 mL/hr over 120 Minutes Intravenous  Once 07/06/19 0206 07/06/19 0532   07/06/19 0207  vancomycin variable dose per unstable renal function (pharmacist dosing)  Status:  Discontinued      Does not apply See admin instructions 07/06/19 0207 07/06/19 0744   07/05/19 2200  meropenem (MERREM) 1 g in sodium chloride 0.9 % 100 mL IVPB  Status:  Discontinued     1 g 200 mL/hr over 30 Minutes Intravenous Every 12 hours 07/05/19 2152 07/06/19 0744   07/05/19 1645  cefTRIAXone (ROCEPHIN) injection 250 mg  Status:  Discontinued     250 mg Intramuscular Every 24 hours 07/05/19 1641 07/05/19 2146   07/05/19 1600  ceFEPIme (MAXIPIME) 2 g in sodium chloride 0.9 % 100 mL IVPB     2 g 200 mL/hr over 30 Minutes Intravenous  Once 07/05/19 1546 07/05/19 1813   07/05/19 1600  metroNIDAZOLE (FLAGYL) IVPB 500 mg     500 mg 100 mL/hr over 60 Minutes Intravenous  Once 07/05/19 1546 07/05/19 2211   07/05/19 1600  vancomycin (VANCOCIN) IVPB 1000 mg/200 mL premix     1,000 mg 200 mL/hr over 60 Minutes Intravenous  Once 07/05/19 1546 07/05/19 1725      Current Facility-Administered Medications  Medication Dose Route Frequency Provider Last Rate Last Admin  . 0.9 %  sodium chloride infusion  250 mL Intravenous PRN Abbie Sons, MD   New Bag at 07/05/19 2325  . 0.9 %  sodium chloride infusion  250 mL Intravenous  Continuous Krithi Bray C, MD   Stopped at 07/05/19 1828  . acetaminophen (TYLENOL) tablet 650 mg  650 mg Oral Q4H PRN Elainah Rhyne C, MD      . cefTRIAXone (ROCEPHIN) 2 g in sodium chloride 0.9 % 100 mL IVPB  2 g Intravenous Q24H Hart Robinsons A, RPH   Stopped at 07/06/19 5852  . chlorhexidine (PERIDEX) 0.12 % solution 15 mL  15 mL Mouth Rinse BID Kieana Livesay C, MD      . chlorhexidine gluconate (MEDLINE KIT) (PERIDEX) 0.12 % solution 15 mL  15 mL Mouth Rinse BID Darel Hong D, NP   15 mL at 07/06/19 0748  . Chlorhexidine Gluconate Cloth 2 % PADS 6 each  6 each Topical Daily Darel Hong D, NP      . dextrose 50 % solution           . famotidine (PEPCID) IVPB 20 mg premix  20 mg Intravenous Q12H Lennette Fader, Ronda Fairly, MD   Stopped at 07/06/19 1055  . fentaNYL (SUBLIMAZE) bolus via infusion 25 mcg  25 mcg Intravenous Q15 min PRN Bradly Bienenstock, NP      . fentaNYL (SUBLIMAZE) injection 25 mcg  25 mcg Intravenous Once Bradly Bienenstock, NP      .  fentaNYL 2521mg in NS 2533m(1074mml) infusion-PREMIX  25-200 mcg/hr Intravenous Continuous KeeDarel Hong NP 20 mL/hr at 07/06/19 1100 200 mcg/hr at 07/06/19 1100  . heparin injection 5,000 Units  5,000 Units Subcutaneous Q8H Cruz Bong C, MD   5,000 Units at 07/06/19 0512  . hydrocortisone sodium succinate (SOLU-CORTEF) 100 MG injection 50 mg  50 mg Intravenous Q6H KeeDarel Hong NP   50 mg at 07/06/19 0600  . lactated ringers bolus 1,000 mL  1,000 mL Intravenous Once Radiah Lubinski C, MD      . lactated ringers infusion   Intravenous Continuous StoAbbie SonsD 125 mL/hr at 07/06/19 1130 New Bag at 07/06/19 1130  . levalbuterol (XOPENEX) nebulizer solution 1.25 mg  1.25 mg Nebulization Q6H PRN Eyleen Rawlinson C, MD   1.25 mg at 07/05/19 2000  . MEDLINE mouth rinse  15 mL Mouth Rinse q12n4p Birney Belshe C, MD      . MEDLINE mouth rinse  15 mL Mouth Rinse 10 times per day KeeDarel Hong NP   15 mL at 07/06/19 1130  .  midazolam (VERSED) injection 1 mg  1 mg Intravenous Q2H PRN KeeDarel Hong NP      . norepinephrine (LEVOPHED) 16 mg in 250m57memix infusion  0-40 mcg/min Intravenous Titrated KeenDarel HongNP 7.5 mL/hr at 07/06/19 1100 8 mcg/min at 07/06/19 1100  . ondansetron (ZOFRAN) injection 4 mg  4 mg Intravenous Q6H PRN Eulogio Requena C, MD      . sodium chloride flush (NS) 0.9 % injection 3 mL  3 mL Intravenous Q12H Alizee Maple C, MD   3 mL at 07/05/19 2110  . sodium chloride flush (NS) 0.9 % injection 3 mL  3 mL Intravenous PRN Sherrill Buikema C, MD      . vasopressin (PITRESSIN) 40 Units in sodium chloride 0.9 % 250 mL (0.16 Units/mL) infusion  0.03 Units/min Intravenous Continuous KeenBradly Bienenstock   Stopped at 07/06/19 0743     Objective: Vital signs in last 24 hours: Temp:  [97.4 F (36.3 C)-101.3 F (38.5 C)] 97.5 F (36.4 C) (03/06 1118) Pulse Rate:  [70-122] 77 (03/06 1100) Resp:  [13-23] 20 (03/06 1100) BP: (66-153)/(40-112) 129/66 (03/06 1100) SpO2:  [90 %-100 %] 95 % (03/06 1118) Arterial Line BP: (107-135)/(50-58) 128/55 (03/06 0745) FiO2 (%):  [35 %-90 %] 35 % (03/06 1118) Weight:  [125.2 kg-141.5 kg] 125.6 kg (03/06 0319)  Intake/Output from previous day: 03/05 0701 - 03/06 0700 In: 4198.9 [I.V.:1849.1; IV Piggyback:2349.8] Out: 2005 [Urine:2000; Blood:5] Intake/Output this shift: Total I/O In: 566.2 [I.V.:436.8; IV Piggyback:129.4] Out: 1575 [Urine:1575]   Physical Exam:  Sedated, abdomen soft Foley catheter draining clear urine  Lab Results:  Recent Labs    07/05/19 1452 07/06/19 0323  WBC 11.3* 23.5*  HGB 14.9 14.5  HCT 45.4 44.1  PLT 139* 102*   BMET Recent Labs    07/05/19 1452 07/06/19 0323  NA 136 139  K 4.5 4.1  CL 101 108  CO2 21* 22  GLUCOSE 69* 103*  BUN 33* 32*  CREATININE 2.91* 2.21*  CALCIUM 9.0 8.0*   Recent Labs    07/05/19 2358 07/06/19 0500  PHART 7.22* 7.24*  HCO3 21.3 21.9      Assessment: - Critically  ill patient with septic shock status post stent placement  s/p Procedure(s): CYSTOSCOPY WITH STENT PLACEMENT  Recommendation: -Continue Foley catheter drainage    ScotAbbie Sons/2021

## 2019-07-06 NOTE — Progress Notes (Signed)
CRITICAL CARE NOTE  SYNOPSIS 69 y.o.malebelow listed past medical history presents to the ER for evaluation of generalized weakness malaise is progressively worse over the past 2 days. States his been having daily diarrheal episodes.  Was found by EMS to be hypotensive low-grade temperature. +BACTEREMIA ER course Patient  is lethargic, hypotensive, hypoxic Severe septic shock with LA 5 Given 3 L NS Given ABX CODE SEPSIS   CC  follow up respiratory failure On vent Follow up resp failure    SUBJECTIVE Patient remains critically ill Prognosis is guarded Multiorgan failure +ENTEROBACTER AND PROTEUS SPECIES  CBC    Component Value Date/Time   WBC 23.5 (H) 07/06/2019 0323   RBC 4.79 07/06/2019 0323   HGB 14.5 07/06/2019 0323   HGB 14.9 08/28/2014 0909   HCT 44.1 07/06/2019 0323   HCT 42.3 08/28/2014 0909   PLT 102 (L) 07/06/2019 0323   PLT 141 (L) 08/28/2014 0909   MCV 92.1 07/06/2019 0323   MCV 97 08/28/2014 0909   MCH 30.3 07/06/2019 0323   MCHC 32.9 07/06/2019 0323   RDW 13.9 07/06/2019 0323   RDW 17.4 (H) 08/28/2014 0909   LYMPHSABS 0.2 (L) 07/05/2019 1452   LYMPHSABS 0.8 (L) 08/28/2014 0909   MONOABS 0.2 07/05/2019 1452   MONOABS 0.6 08/28/2014 0909   EOSABS 0.0 07/05/2019 1452   EOSABS 0.1 08/28/2014 0909   BASOSABS 0.0 07/05/2019 1452   BASOSABS 0.0 08/28/2014 0909   BMP Latest Ref Rng & Units 07/06/2019 07/05/2019 10/19/2017  Glucose 70 - 99 mg/dL 103(H) 69(L) -  BUN 8 - 23 mg/dL 32(H) 33(H) -  Creatinine 0.61 - 1.24 mg/dL 2.21(H) 2.91(H) 1.20  Sodium 135 - 145 mmol/L 139 136 -  Potassium 3.5 - 5.1 mmol/L 4.1 4.5 -  Chloride 98 - 111 mmol/L 108 101 -  CO2 22 - 32 mmol/L 22 21(L) -  Calcium 8.9 - 10.3 mg/dL 8.0(L) 9.0 -     BP 140/69   Pulse 73   Temp 98.8 F (37.1 C) (Axillary)   Resp 18   Ht _0  (1.778 m)   Wt 125.6 kg   SpO2 96%   BMI 39.73 kg/m    I/O last 3 completed shifts: In: 4198.9 [I.V.:1849.1; IV Piggyback:2349.8] Out: 2005  [Urine:2000; Blood:5] No intake/output data recorded.  SpO2: 96 % O2 Flow Rate (L/min): 3 L/min FiO2 (%): 50 %(sat 96%)   SIGNIFICANT EVENTS 3/5 admitted for septic shock 3/6 on vent s/p URETERAL STENT placement 3/6 post op resp failure and severe septic shock 3/6 CVL placed RT IJ   Antimicrobials this admission: Cefepime/Metronidazole 3/5 x 1 in ED Meropenem 3/5 >>  Vancomycin 3/5 >>      REVIEW OF SYSTEMS  PATIENT IS UNABLE TO PROVIDE COMPLETE REVIEW OF SYSTEMS DUE TO SEVERE CRITICAL ILLNESS   PHYSICAL EXAMINATION:  GENERAL:critically ill appearing, +resp distress HEAD: Normocephalic, atraumatic.  EYES: Pupils equal, round, reactive to light.  No scleral icterus.  MOUTH: Moist mucosal membrane. NECK: Supple.  PULMONARY: +rhonchi, +wheezing CARDIOVASCULAR: S1 and S2. Regular rate and rhythm. No murmurs, rubs, or gallops.  GASTROINTESTINAL: Soft, nontender, -distended.  Positive bowel sounds.   MUSCULOSKELETAL: No swelling, clubbing, or edema.  NEUROLOGIC: obtunded, GCS<8 SKIN:intact,warm,dry  MEDICATIONS: I have reviewed all medications and confirmed regimen as documented   CULTURE RESULTS   Recent Results (from the past 240 hour(s))  Blood Culture (routine x 2)     Status: None (Preliminary result)   Collection Time: 07/05/19  2:52 PM  Specimen: BLOOD  Result Value Ref Range Status   Specimen Description BLOOD RIGHT ANTECUBITAL  Final   Special Requests   Final    BOTTLES DRAWN AEROBIC AND ANAEROBIC Blood Culture adequate volume   Culture  Setup Time   Final    IN BOTH AEROBIC AND ANAEROBIC BOTTLES GRAM NEGATIVE RODS CRITICAL VALUE NOTED.  VALUE IS CONSISTENT WITH PREVIOUSLY REPORTED AND CALLED VALUE. Performed at [facility], [address] [place], [place] [address]    Culture GRAM NEGATIVE RODS  Final   Report Status PENDING  Incomplete  Blood Culture (routine x 2)     Status: None (Preliminary result)   Collection Time: 07/05/19  2:57  PM   Specimen: BLOOD  Result Value Ref Range Status   Specimen Description BLOOD LEFT ANTECUBITAL  Final   Special Requests   Final    BOTTLES DRAWN AEROBIC AND ANAEROBIC Blood Culture adequate volume   Culture  Setup Time   Final    Organism ID to follow ANAEROBIC BOTTLE ONLY GRAM NEGATIVE RODS CRITICAL RESULT CALLED TO, READ BACK BY AND VERIFIED WITH: [place] ON 07/06/19 AT [facility] Performed at [facility], [address], [place], [place] [address]    Culture GRAM NEGATIVE RODS  Final   Report Status PENDING  Incomplete  Blood Culture ID Panel (Reflexed)     Status: Abnormal   Collection Time: 07/05/19  2:57 PM  Result Value Ref Range Status   Enterococcus species NOT DETECTED NOT DETECTED Final   Listeria monocytogenes NOT DETECTED NOT DETECTED Final   Staphylococcus species NOT DETECTED NOT DETECTED Final   Staphylococcus aureus (BCID) NOT DETECTED NOT DETECTED Final   Streptococcus species NOT DETECTED NOT DETECTED Final   Streptococcus agalactiae NOT DETECTED NOT DETECTED Final   Streptococcus pneumoniae NOT DETECTED NOT DETECTED Final   Streptococcus pyogenes NOT DETECTED NOT DETECTED Final   Acinetobacter baumannii NOT DETECTED NOT DETECTED Final   Enterobacteriaceae species DETECTED (A) NOT DETECTED Final    Comment: Enterobacteriaceae represent a large family of gram-negative bacteria, not a single organism. CRITICAL RESULT CALLED TO, READ BACK BY AND VERIFIED WITH: SCOTT HALL ON 07/06/19 AT 0446 [facility]    Enterobacter cloacae complex NOT DETECTED NOT DETECTED Final   Escherichia coli NOT DETECTED NOT DETECTED Final   Klebsiella oxytoca NOT DETECTED NOT DETECTED Final   Klebsiella pneumoniae NOT DETECTED NOT DETECTED Final   Proteus species DETECTED (A) NOT DETECTED Final    Comment: CRITICAL RESULT CALLED TO, READ BACK BY AND VERIFIED WITH: SCOTT HALL ON 07/06/19 AT 0446 [facility]    Serratia marcescens NOT DETECTED NOT DETECTED Final   Carbapenem resistance NOT  DETECTED NOT DETECTED Final   Haemophilus influenzae NOT DETECTED NOT DETECTED Final   Neisseria meningitidis NOT DETECTED NOT DETECTED Final   Pseudomonas aeruginosa NOT DETECTED NOT DETECTED Final   Candida albicans NOT DETECTED NOT DETECTED Final   Candida glabrata NOT DETECTED NOT DETECTED Final   Candida krusei NOT DETECTED NOT DETECTED Final   Candida parapsilosis NOT DETECTED NOT DETECTED Final   Candida tropicalis NOT DETECTED NOT DETECTED Final    Comment: Performed at [facility], [address] [place], [place] [address]  Respiratory Panel by RT PCR (Flu A&B, Covid) - Nasopharyngeal Swab     Status: None   Collection Time: 07/05/19  4:40 PM   Specimen: Nasopharyngeal Swab  Result Value Ref Range Status   SARS Coronavirus 2 by RT PCR NEGATIVE NEGATIVE Final  Comment: (NOTE) SARS-CoV-2 target nucleic acids are NOT DETECTED. The SARS-CoV-2 RNA is generally detectable in upper respiratoy specimens during the acute phase of infection. The lowest concentration of SARS-CoV-2 viral copies this assay can detect is 131 copies/mL. A negative result does not preclude SARS-Cov-2 infection and should not be used as the sole basis for treatment or other patient management decisions. A negative result may occur with  improper specimen collection/handling, submission of specimen other than nasopharyngeal swab, presence of viral mutation(s) within the areas targeted by this assay, and inadequate number of viral copies (<131 copies/mL). A negative result must be combined with clinical observations, patient history, and epidemiological information. The expected result is Negative. Fact Sheet for Patients:  PinkCheek.be Fact Sheet for Healthcare Providers:  GravelBags.it This test is not yet ap proved or cleared by the [place] FDA and  has been authorized for detection and/or diagnosis of SARS-CoV-2 by FDA under an  Emergency Use Authorization (EUA). This EUA will remain  in effect (meaning this test can be used) for the duration of the COVID-19 declaration under Section 564(b)(1) of the Act, 21 U.S.C. section 360bbb-3(b)(1), unless the authorization is terminated or revoked sooner.    Influenza A by PCR NEGATIVE NEGATIVE Final   Influenza B by PCR NEGATIVE NEGATIVE Final    Comment: (NOTE) The Xpert Xpress SARS-CoV-2/FLU/RSV assay is intended as an aid in  the diagnosis of influenza from Nasopharyngeal swab specimens and  should not be used as a sole basis for treatment. Nasal washings and  aspirates are unacceptable for Xpert Xpress SARS-CoV-2/FLU/RSV  testing. Fact Sheet for Patients: PinkCheek.be Fact Sheet for Healthcare Providers: GravelBags.it This test is not yet approved or cleared by the [place] FDA and  has been authorized for detection and/or diagnosis of SARS-CoV-2 by  FDA under an Emergency Use Authorization (EUA). This EUA will remain  in effect (meaning this test can be used) for the duration of the  Covid-19 declaration under Section 564(b)(1) of the Act, 21  U.S.C. section 360bbb-3(b)(1), unless the authorization is  terminated or revoked. Performed at [facility], [address] [place], [place] [address]   MRSA PCR Screening     Status: None   Collection Time: 07/05/19  6:12 PM   Specimen: Nasopharyngeal  Result Value Ref Range Status   MRSA by PCR NEGATIVE NEGATIVE Final    Comment:        The GeneXpert MRSA Assay (FDA approved for NASAL specimens only), is one component of a comprehensive MRSA colonization surveillance program. It is not intended to diagnose MRSA infection nor to guide or monitor treatment for MRSA infections. Performed at [facility], [address] [place], [place] [address]           IMAGING    CT HEAD WO CONTRAST  Result Date: 07/05/2019 CLINICAL  DATA:  Altered mental status. EXAM: CT HEAD WITHOUT CONTRAST TECHNIQUE: Contiguous axial images were obtained from the base of the skull through the vertex without intravenous contrast. COMPARISON:  July 21, 2009 FINDINGS: Brain: No evidence of acute infarction, hemorrhage, hydrocephalus, extra-axial collection or mass lesion/mass effect. 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 Atrophy and chronic microvascular ischemic changes are noted which have progressed since the prior study in 2011. Vascular: No hyperdense vessel or unexpected calcification. Skull: Normal. Negative for fracture or focal lesion. Sinuses/Orbits: There is mucosal thickening of the left maxillary sinus. The remaining paranasal sinuses and mastoid air cells are essentially clear.  Other: None. IMPRESSION: 1. No acute intracranial abnormality. 2. Chronic findings as detailed above. Electronically Signed   By: Constance Holster M.D.   On: 07/05/2019 21:09   DG Chest Port 1 View  Result Date: 07/06/2019 CLINICAL DATA:  Central line placement EXAM: PORTABLE CHEST 1 VIEW COMPARISON:  July 06, 2019 FINDINGS: There is a newly placed right-sided central venous catheter with tip projecting over the proximal SVC. Endotracheal tube terminates approximately 3.7 cm above the carina. There is improved aeration in the right upper lung. Scattered airspace opacities are again noted including masslike consolidation involving the right upper lobe and right perihilar region. IMPRESSION: 1. Lines and tubes as above. 2. No pneumothorax. 3. Improved aeration in the right upper lobe. 4. Persistent bilateral airspace opacities with masslike consolidation in the right upper lobe and right perihilar region. Consider further evaluation with a nonemergent contrast enhanced CT. Electronically Signed   By: Constance Holster M.D.   On: 07/06/2019 02:13   Portable Chest x-ray  Result Date: 07/06/2019 CLINICAL DATA:  Endotracheal tube placement EXAM:  PORTABLE CHEST 1 VIEW COMPARISON:  July 05, 2019 FINDINGS: There is new near complete opacification of the right upper lobe. The endotracheal tube terminates approximately 4.3 cm above the carina. Scattered bilateral airspace opacities are again noted. There is elevation of the right hemidiaphragm. There is no pneumothorax or large pleural effusion. IMPRESSION: 1. Endotracheal tube as above. 2. New short interval development of near complete opacification of the right upper lobe with right-sided volume loss is concerning for interval mucous plugging. 3. Again noted are scattered bilateral airspace opacities, similar to prior study. Electronically Signed   By: Constance Holster M.D.   On: 07/06/2019 00:29   DG Chest Port 1 View  Result Date: 07/05/2019 CLINICAL DATA:  Weakness, evaluate for pneumonia. Additional history provided: Generalized weakness for 2 days, history of COPD, swelling in bilateral legs. EXAM: PORTABLE CHEST 1 VIEW COMPARISON:  Chest CT 12/24/2018, FINDINGS: Heart size within normal limits. Redemonstrated masslike opacity as well as smaller nodular opacities within the right upper lobe. More ill-defined opacities are present within the bilateral lung bases which may reflect atelectasis or pneumonia. No evidence of pleural effusion or pneumothorax. No acute bony abnormality. Overlying cardiac monitoring leads IMPRESSION: Redemonstrated masslike opacity as well as smaller nodular opacities within the right upper lobe. Findings are again suspicious for malignancy. Please refer to chest CT 12/24/2018 for description. Interstitial and more ill-defined opacities within the left greater than right lung bases, which may reflect atelectasis or pneumonia. Aortic atherosclerosis. Electronically Signed   By: Kellie Simmering DO   On: 07/05/2019 15:33   DG OR UROLOGY CYSTO IMAGE ([place])  Result Date: 07/05/2019 There is no interpretation for this exam.  This order is for images obtained during a surgical  procedure.  Please See "Surgeries" Tab for more information regarding the procedure.   CT Renal Stone Study  Result Date: 07/05/2019 CLINICAL DATA:  Right flank pain. Nephrolithiasis. EXAM: CT ABDOMEN AND PELVIS WITHOUT CONTRAST TECHNIQUE: Multidetector CT imaging of the abdomen and pelvis was performed following the standard protocol without IV contrast. COMPARISON:  None. FINDINGS: Lower chest: No acute findings. Hepatobiliary: No mass visualized on this unenhanced exam. Calcified gallstone seen, however there is no evidence of cholecystitis or biliary dilatation. Pancreas: No mass or inflammatory process visualized on this unenhanced exam. Spleen:  Within normal limits in size. Adrenals/Urinary tract: Normal appearance of adrenal glands. Small fluid attenuation right renal cyst noted. A partial staghorn  calculus is seen in the lower pole collecting system of the right kidney. Several other renal calculi are seen bilaterally measuring up to 6 mm. Mild right hydronephrosis is seen due to a 5 mm calculus in the proximal right ureter. Right renal swelling and mild perinephric stranding also noted. Stomach/Bowel: Small hiatal hernia noted. No evidence of obstruction, inflammatory process, or abnormal fluid collections. Normal appendix visualized. Vascular/Lymphatic: No pathologically enlarged lymph nodes identified. No evidence of abdominal aortic aneurysm. Aortic atherosclerosis incidentally noted. Reproductive:  No mass or other significant abnormality. Other:  None. Musculoskeletal:  No suspicious bone lesions identified. IMPRESSION: 1. Mild right hydronephrosis due to 5 mm proximal right ureteral calculus. 2. 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 3. Cholelithiasis. No radiographic evidence of cholecystitis. 4. Small hiatal hernia. Electronically Signed   By: Marlaine Hind M.D.   On: 07/05/2019 18:22       Indwelling Urinary Catheter continued,  requirement due to   Reason to continue Indwelling Urinary Catheter strict Intake/Output monitoring for hemodynamic instability   Central Line/ continued, requirement due to  Reason to continue Rhea of central venous pressure or other hemodynamic parameters and poor IV access   Ventilator continued, requirement due to severe respiratory failure   Ventilator Sedation RASS 0 to -2      ASSESSMENT AND PLAN SYNOPSIS   Severe ACUTE Hypoxic and Hypercapnic Respiratory Failure from severe septic shock and severe bacteremia from Pyelonephritis and obstructed kidney stone -continue Full MV support -continue Bronchodilator Therapy -Wean Fio2 and PEEP as tolerated -will perform SAT/SBT when respiratory parameters are met   ACUTE KIDNEY INJURY/Renal Failure -follow chem 7 -follow UO -continue Foley Catheter-assess need -Avoid nephrotoxic agents -Recheck creatinine    NEUROLOGY - intubated and sedated - minimal sedation to achieve a RASS goal: -1 Wake up assessment pending   SHOCK-SEPSIS/HYPOVOLUMIC -use vasopressors to keep MAP>65 -follow ABG and LA -follow up cultures -emperic ABX -consider stress dose steroids -aggressive IV fluid resuscitation  CARDIAC ICU monitoring  ID -continue IV abx as prescibed -follow up cultures  GI GI PROPHYLAXIS as indicated  NUTRITIONAL STATUS DIET-->TF's as tolerated Constipation protocol as indicated  ENDO - will use ICU hypoglycemic\Hyperglycemia protocol if indicated   ELECTROLYTES -follow labs as needed -replace as needed -pharmacy consultation and following   DVT/GI PRX ordered TRANSFUSIONS AS NEEDED MONITOR FSBS ASSESS the need for LABS as needed   Critical Care Time devoted to patient care services described in this note is 35 minutes.   Overall, patient is critically ill, prognosis is guarded.  Patient with Multiorgan failure and at high risk for cardiac arrest and death.    Corrin Parker, M.D.  [facility]  Medical Director [place] Director [facility] Cardio-Pulmonary Department

## 2019-07-06 NOTE — Progress Notes (Signed)
Shift summary:  - Patient admitted to unit from ED with S/S sepsis. - Report given to Manuella Ghazi, RN

## 2019-07-06 NOTE — Progress Notes (Signed)
Pharmacy Antibiotic Note  Shawn Lindsey is a 69 y.o. male admitted on 07/05/2019 with UTI.  Pharmacy has been consulted for Meropenem dosing and Vancomycin.  Pt received Vancomycin 1gm x 1 on 3/5 then Vancomycin 1500mg  x 1 on 3/6 to complete loading dose.   Plan: Meropenem 1g IV q12h  Variable Vancomycin dosing due to changing renal fxn  Height: 5\' 10"  (177.8 cm) Weight: 276 lb 14.4 oz (125.6 kg) IBW/kg (Calculated) : 73  Temp (24hrs), Avg:99.5 F (37.5 C), Min:98.6 F (37 C), Max:101.3 F (38.5 C)  Recent Labs  Lab 07/05/19 1452 07/05/19 1633 07/05/19 2219 07/06/19 0222  WBC 11.3*  --   --   --   CREATININE 2.91*  --   --   --   LATICACIDVEN 5.2* 3.3* 2.6* 1.9    Estimated Creatinine Clearance: 31.9 mL/min (A) (by C-G formula based on SCr of 2.91 mg/dL (H)).    No Known Allergies  Antimicrobials this admission: Cefepime/Metronidazole 3/5 x 1 in ED Meropenem 3/5 >>  Vancomycin 3/5 >>  Dose adjustments this admission:  Microbiology results:  Thank you for allowing pharmacy to be a part of this patient's care.  Hart Robinsons, PharmD 07/06/2019 3:31 AM

## 2019-07-06 NOTE — Progress Notes (Signed)
PHARMACY - PHYSICIAN COMMUNICATION CRITICAL VALUE ALERT - BLOOD CULTURE IDENTIFICATION (BCID)  Shawn Lindsey is an 69 y.o. male who presented to [facility] on 07/05/2019 with a chief complaint of UTI  Assessment:  Lab reports 2 of 4 bottles w/ GNR, proteus, KPC not detected  Name of physician (or Provider) Contacted: Tana Conch, NP  Current antibiotics: Meropenem and Vancomycin  Changes to prescribed antibiotics recommended: Add Rocephin 2gm IV q24hrs to regimen and deescalate when more data available.  Recommendations accepted by provider  No results found for this or any previous visit.  Hart Robinsons A 07/06/2019  4:46 AM

## 2019-07-06 NOTE — Plan of Care (Signed)

## 2019-07-06 NOTE — Anesthesia Procedure Notes (Signed)
Procedure Name: Intubation Date/Time: 07/06/2019 11:09 PM Performed by: Esaw Grandchild, CRNA Pre-anesthesia Checklist: Patient identified, Emergency Drugs available, Suction available and Patient being monitored Patient Re-evaluated:Patient Re-evaluated prior to induction Oxygen Delivery Method: Circle system utilized Preoxygenation: Pre-oxygenation with 100% oxygen Induction Type: Rapid sequence, IV induction and Cricoid Pressure applied Ventilation: Mask ventilation without difficulty Laryngoscope Size: McGraph and 4 Grade View: Grade I Tube type: Oral Tube size: 8.0 mm Number of attempts: 1 Airway Equipment and Method: Stylet and Oral airway Placement Confirmation: ETT inserted through vocal cords under direct vision,  positive ETCO2 and breath sounds checked- equal and bilateral Secured at: 22 cm Tube secured with: Tape Dental Injury: Teeth and Oropharynx as per pre-operative assessment

## 2019-07-07 ENCOUNTER — Inpatient Hospital Stay: Payer: Medicare HMO

## 2019-07-07 LAB — CBC WITH DIFFERENTIAL/PLATELET
Abs Immature Granulocytes: 0.31 10*3/uL — ABNORMAL HIGH (ref 0.00–0.07)
Basophils Absolute: 0 10*3/uL (ref 0.0–0.1)
Basophils Relative: 0 %
Eosinophils Absolute: 3.1 10*3/uL — ABNORMAL HIGH (ref 0.0–0.5)
Eosinophils Relative: 12 %
HCT: 42.8 % (ref 39.0–52.0)
Hemoglobin: 14 g/dL (ref 13.0–17.0)
Immature Granulocytes: 1 %
Lymphocytes Relative: 4 %
Lymphs Abs: 1.1 10*3/uL (ref 0.7–4.0)
MCH: 30.4 pg (ref 26.0–34.0)
MCHC: 32.7 g/dL (ref 30.0–36.0)
MCV: 92.8 fL (ref 80.0–100.0)
Monocytes Absolute: 1.2 10*3/uL — ABNORMAL HIGH (ref 0.1–1.0)
Monocytes Relative: 5 %
Neutro Abs: 19.6 10*3/uL — ABNORMAL HIGH (ref 1.7–7.7)
Neutrophils Relative %: 78 %
Platelets: 91 10*3/uL — ABNORMAL LOW (ref 150–400)
RBC: 4.61 MIL/uL (ref 4.22–5.81)
RDW: 14 % (ref 11.5–15.5)
WBC: 25.4 10*3/uL — ABNORMAL HIGH (ref 4.0–10.5)
nRBC: 0 % (ref 0.0–0.2)

## 2019-07-07 LAB — GLUCOSE, CAPILLARY
Glucose-Capillary: 130 mg/dL — ABNORMAL HIGH (ref 70–99)
Glucose-Capillary: 132 mg/dL — ABNORMAL HIGH (ref 70–99)
Glucose-Capillary: 108 mg/dL — ABNORMAL HIGH (ref 70–99)
Glucose-Capillary: 123 mg/dL — ABNORMAL HIGH (ref 70–99)
Glucose-Capillary: 84 mg/dL (ref 70–99)

## 2019-07-07 LAB — MAGNESIUM: Magnesium: 2.6 mg/dL — ABNORMAL HIGH (ref 1.7–2.4)

## 2019-07-07 LAB — URINE CULTURE: Culture: >=100000 — AB

## 2019-07-07 LAB — PHOSPHORUS: Phosphorus: 4.1 mg/dL (ref 2.5–4.6)

## 2019-07-07 LAB — BASIC METABOLIC PANEL
Anion gap: 5 (ref 5–15)
BUN: 34 mg/dL — ABNORMAL HIGH (ref 8–23)
CO2: 27 mmol/L (ref 22–32)
Calcium: 8.3 mg/dL — ABNORMAL LOW (ref 8.9–10.3)
Chloride: 108 mmol/L (ref 98–111)
Creatinine, Ser: 1.29 mg/dL — ABNORMAL HIGH (ref 0.61–1.24)
GFR calc Af Amer: >60 mL/min (ref >60)
GFR calc non Af Amer: 56 mL/min — ABNORMAL LOW (ref >60)
Glucose, Bld: 144 mg/dL — ABNORMAL HIGH (ref 70–99)
Potassium: 4.6 mmol/L (ref 3.5–5.1)
Sodium: 140 mmol/L (ref 135–145)

## 2019-07-07 LAB — PROCALCITONIN: Procalcitonin: 6.45 ng/mL

## 2019-07-07 MED ORDER — FLUOXETINE HCL 10 MG PO CAPS
10.0000 mg | ORAL_CAPSULE | Freq: Every day | ORAL | Status: DC
  Administered 2019-07-08 – 2019-07-12 (×5): 10 mg via ORAL
  Filled 2019-07-07 (×6): qty 1

## 2019-07-07 MED ORDER — FLUOXETINE HCL 10 MG PO CAPS
10.0000 mg | ORAL_CAPSULE | ORAL | Status: AC
  Administered 2019-07-07: 10 mg via ORAL
  Filled 2019-07-07: qty 1

## 2019-07-07 MED ORDER — LACTATED RINGERS IV BOLUS
500.0000 mL | Freq: Once | INTRAVENOUS | Status: AC
  Administered 2019-07-07: 500 mL via INTRAVENOUS

## 2019-07-07 MED ORDER — LEVOTHYROXINE SODIUM 50 MCG PO TABS
125.0000 ug | ORAL_TABLET | Freq: Every day | ORAL | Status: DC
  Filled 2019-07-07: qty 1

## 2019-07-07 MED ORDER — GABAPENTIN 300 MG PO CAPS
600.0000 mg | ORAL_CAPSULE | Freq: Every day | ORAL | Status: DC
  Administered 2019-07-08 – 2019-07-11 (×5): 600 mg via ORAL
  Filled 2019-07-07 (×6): qty 2

## 2019-07-07 MED ORDER — LEVETIRACETAM 500 MG PO TABS
500.0000 mg | ORAL_TABLET | Freq: Two times a day (BID) | ORAL | Status: AC
  Administered 2019-07-07 – 2019-07-11 (×9): 500 mg via ORAL
  Filled 2019-07-07 (×12): qty 1

## 2019-07-07 MED ORDER — PIPERACILLIN-TAZOBACTAM 3.375 G IVPB
3.3750 g | Freq: Three times a day (TID) | INTRAVENOUS | Status: DC
  Administered 2019-07-07 – 2019-07-12 (×14): 3.375 g via INTRAVENOUS
  Filled 2019-07-07 (×21): qty 50

## 2019-07-07 MED ORDER — NICOTINE 21 MG/24HR TD PT24
21.0000 mg | MEDICATED_PATCH | Freq: Every day | TRANSDERMAL | Status: DC
  Administered 2019-07-07 – 2019-07-11 (×5): 21 mg via TRANSDERMAL
  Filled 2019-07-07 (×6): qty 1

## 2019-07-07 NOTE — Progress Notes (Signed)
Urology Consult Follow Up  Subjective: Extubated this morning.  Some confusion.  Intraoperative urine culture growing Proteus.  Creatinine improved 1.29.  Good urine output  Anti-infectives: Anti-infectives (From admission, onward)   Start     Dose/Rate Route Frequency Ordered Stop   07/07/19 0600  cefTRIAXone (ROCEPHIN) 2 g in sodium chloride 0.9 % 100 mL IVPB  Status:  Discontinued     2 g 200 mL/hr over 30 Minutes Intravenous Every 24 hours 07/06/19 0518 07/06/19 0529   07/06/19 0600  cefTRIAXone (ROCEPHIN) 2 g in sodium chloride 0.9 % 100 mL IVPB     2 g 200 mL/hr over 30 Minutes Intravenous Every 24 hours 07/06/19 0529     07/06/19 0215  vancomycin (VANCOREADY) IVPB 1500 mg/300 mL     1,500 mg 150 mL/hr over 120 Minutes Intravenous  Once 07/06/19 0206 07/06/19 0532   07/06/19 0207  vancomycin variable dose per unstable renal function (pharmacist dosing)  Status:  Discontinued      Does not apply See admin instructions 07/06/19 0207 07/06/19 0744   07/05/19 2200  meropenem (MERREM) 1 g in sodium chloride 0.9 % 100 mL IVPB  Status:  Discontinued     1 g 200 mL/hr over 30 Minutes Intravenous Every 12 hours 07/05/19 2152 07/06/19 0744   07/05/19 1645  cefTRIAXone (ROCEPHIN) injection 250 mg  Status:  Discontinued     250 mg Intramuscular Every 24 hours 07/05/19 1641 07/05/19 2146   07/05/19 1600  ceFEPIme (MAXIPIME) 2 g in sodium chloride 0.9 % 100 mL IVPB     2 g 200 mL/hr over 30 Minutes Intravenous  Once 07/05/19 1546 07/05/19 1813   07/05/19 1600  metroNIDAZOLE (FLAGYL) IVPB 500 mg     500 mg 100 mL/hr over 60 Minutes Intravenous  Once 07/05/19 1546 07/05/19 2211   07/05/19 1600  vancomycin (VANCOCIN) IVPB 1000 mg/200 mL premix     1,000 mg 200 mL/hr over 60 Minutes Intravenous  Once 07/05/19 1546 07/05/19 1725      Current Facility-Administered Medications  Medication Dose Route Frequency Provider Last Rate Last Admin  . 0.9 %  sodium chloride infusion  250 mL Intravenous  Continuous Nirvaan Frett, Ronda Fairly, MD   Stopped at 07/05/19 1828  . acetaminophen (TYLENOL) tablet 650 mg  650 mg Oral Q4H PRN Alaisa Moffitt C, MD      . cefTRIAXone (ROCEPHIN) 2 g in sodium chloride 0.9 % 100 mL IVPB  2 g Intravenous Q24H Hall, Merland Holness A, RPH 200 mL/hr at 07/07/19 0505 2 g at 07/07/19 0505  . chlorhexidine (PERIDEX) 0.12 % solution 15 mL  15 mL Mouth Rinse BID Aram Domzalski C, MD   15 mL at 07/07/19 0724  . Chlorhexidine Gluconate Cloth 2 % PADS 6 each  6 each Topical Daily Bradly Bienenstock, NP   6 each at 07/06/19 2245  . dexmedetomidine (PRECEDEX) 400 MCG/100ML (4 mcg/mL) infusion  0.4-1.2 mcg/kg/hr Intravenous Titrated Awilda Bill, NP   Stopped at 07/06/19 2040  . famotidine (PEPCID) IVPB 20 mg premix  20 mg Intravenous Q12H Tauna Macfarlane C, MD 100 mL/hr at 07/07/19 0947 20 mg at 07/07/19 0947  . fentaNYL (SUBLIMAZE) bolus via infusion 25 mcg  25 mcg Intravenous Q15 min PRN Bradly Bienenstock, NP      . fentaNYL (SUBLIMAZE) injection 25 mcg  25 mcg Intravenous Once Darel Hong D, NP      . fentaNYL 2571mcg in NS 221mL (14mcg/ml) infusion-PREMIX  25-200 mcg/hr Intravenous Continuous  Bradly Bienenstock, NP   Stopped at 07/07/19 0725  . heparin injection 5,000 Units  5,000 Units Subcutaneous Q8H Matheus Spiker, Ronda Fairly, MD   5,000 Units at 07/07/19 0505  . hydrocortisone sodium succinate (SOLU-CORTEF) 100 MG injection 50 mg  50 mg Intravenous Q6H Darel Hong D, NP   50 mg at 07/07/19 0604  . lactated ringers infusion   Intravenous Continuous Abbie Sons, MD 125 mL/hr at 07/07/19 0758 New Bag at 07/07/19 0758  . levalbuterol (XOPENEX) nebulizer solution 1.25 mg  1.25 mg Nebulization Q6H PRN Wrenn Willcox C, MD   1.25 mg at 07/05/19 2000  . MEDLINE mouth rinse  15 mL Mouth Rinse 10 times per day Darel Hong D, NP   15 mL at 07/07/19 0936  . midazolam (VERSED) injection 1 mg  1 mg Intravenous Q2H PRN Bradly Bienenstock, NP   1 mg at 07/07/19 0321  . norepinephrine (LEVOPHED)  16 mg in 246mL premix infusion  0-40 mcg/min Intravenous Titrated Bradly Bienenstock, NP   Stopped at 07/07/19 [phone]  . ondansetron (ZOFRAN) injection 4 mg  4 mg Intravenous Q6H PRN Aryn Kops C, MD      . vasopressin (PITRESSIN) 40 Units in sodium chloride 0.9 % 250 mL (0.16 Units/mL) infusion  0.03 Units/min Intravenous Continuous Bradly Bienenstock, NP   Stopped at 07/06/19 0743     Objective: Vital signs in last 24 hours: Temp:  [97.3 F (36.3 C)-98.8 F (37.1 C)] 97.8 F (36.6 C) (03/07 0800) Pulse Rate:  [65-87] 87 (03/07 1100) Resp:  [16-22] 17 (03/07 1100) BP: (92-125)/(46-69) 99/69 (03/07 1100) SpO2:  [90 %-95 %] 95 % (03/07 1100) FiO2 (%):  [35 %-40 %] 40 % (03/07 0803) Weight:  [128.4 kg] 128.4 kg (03/07 0323)  Intake/Output from previous day: 03/06 0701 - 03/07 0700 In: 3189.1 [I.V.:2897.7; IV Piggyback:291.4] Out: 2705 [Urine:2705] Intake/Output this shift: Total I/O In: -  Out: 700 [Urine:700]   Physical Exam: Alert, confused Urine clear  Lab Results:  Recent Labs    07/06/19 0323 07/07/19 0328  WBC 23.5* 25.4*  HGB 14.5 14.0  HCT 44.1 42.8  PLT 102* 91*   BMET Recent Labs    07/06/19 0323 07/07/19 0328  NA 139 140  K 4.1 4.6  CL 108 108  CO2 22 27  GLUCOSE 103* 144*  BUN 32* 34*  CREATININE 2.21* 1.29*  CALCIUM 8.0* 8.3*   PT/INR No results for input(s): LABPROT, INR in the last 72 hours. ABG Recent Labs    07/05/19 2358 07/06/19 0500  PHART 7.22* 7.24*  HCO3 21.3 21.9    Assessment: Remains critically ill although improved in last 24 hours  s/p Procedure(s): CYSTOSCOPY WITH STENT PLACEMENT  Recommendations: -Would leave Foley catheter indwelling another 24 hours to provide maximal drainage of his upper tracts   LOS: 2 days    Abbie Sons 07/07/2019

## 2019-07-07 NOTE — Progress Notes (Signed)
Noted at 1630 to have slight expressive aphasia. After extubation he repeated himself as if he could not remember what he had just said.Now he is calmer and his phases make no sense. He appears to believe his phases are correct. [place] Dr. Mortimer Fries in to assess patient.

## 2019-07-07 NOTE — Anesthesia Postprocedure Evaluation (Signed)
Anesthesia Post Note  Patient: Shawn Lindsey  Procedure(s) Performed: CYSTOSCOPY WITH STENT PLACEMENT (Right )  Patient location during evaluation: SICU Anesthesia Type: General Level of consciousness: sedated Pain management: pain level controlled Vital Signs Assessment: post-procedure vital signs reviewed and stable Respiratory status: patient connected to nasal cannula oxygen Cardiovascular status: stable Postop Assessment: no apparent nausea or vomiting Anesthetic complications: no Comments: Pt remains in SICU, extubated this AM, still guarded prognosis     Last Vitals:  Vitals:   07/07/19 0700 07/07/19 0800  BP: (!) 116/58 (!) 125/53  Pulse: 69 77  Resp: 20 19  Temp: 37.1 C 36.6 C  SpO2: 95% 94%    Last Pain:  Vitals:   07/07/19 0400  TempSrc: Axillary  PainSc:                  [name]

## 2019-07-07 NOTE — Progress Notes (Signed)
Pt extubated without complications, no stridor noted, placed on 4lpm [place], sats 93%, respiratory rate 12/min, will continue to monitor.

## 2019-07-07 NOTE — Progress Notes (Signed)
Noted at 1630 to have slight expressive aphasia. After extubation he repeated himself as if he could not remember what he had just said  Will obtain CT chest to assess for acute CVA     Broxton Broady Patricia Pesa, M.D.  [facility]  Medical Director [place] Director [facility] Cardio-Pulmonary Department

## 2019-07-07 NOTE — Progress Notes (Signed)
0815 Extubated to 4 L nasal canula. Patient spoke immediately. Weaning Levophed. Resp.rate 16 and unlabored.

## 2019-07-07 NOTE — Progress Notes (Signed)
1700 Down to CT . Patient yelling and speaking gibberish and yelling. In CT scanner Patient screams he is dying. Attempted to comfort patient. Could not convince patient he was not dying.He yelled for girlfriend [name]. 1740 Back in room. Patient screams he is in the dark. Eyes are closed. Patient unable to realize his eyes are closed.VSS stable. Patient finally calmed down.

## 2019-07-07 NOTE — Progress Notes (Signed)
CRITICAL CARE NOTE 69 y.o.malebelow listed past medical history presents to the ER for evaluation of generalized weakness malaise is progressively worse over the past 2 days. States his been having daily diarrheal episodes.  Was found by EMS to be hypotensive low-grade temperature. +BACTEREMIA  ER course Patient is lethargic, hypotensive, hypoxic Severe septic shock with LA 5 Given 3 L NS Given ABX CODE SEPSIS  SIGNIFICANT EVENTS 3/5 admitted for septic shock 3/6 on vent s/p URETERAL STENT placement 3/6 post op resp failure and severe septic shock 3/6 CVL placed RT IJ   Antimicrobials this admission: Cefepime/Metronidazole 3/5 x 1 in ED Meropenem 3/5 >> Vancomycin 3/5 >>   CC  follow up respiratory failure  SUBJECTIVE Patient remains critically ill Prognosis is guarded   BP (!) 115/54   Pulse 70   Temp 98.2 F (36.8 C) (Axillary)   Resp 19   Ht _0  (1.778 m)   Wt 128.4 kg   SpO2 94%   BMI 40.62 kg/m    I/O last 3 completed shifts: In: 5677.1 [I.V.:4736.1; IV Piggyback:941] Out: 4081 [Urine:4705; Blood:5] No intake/output data recorded.  SpO2: 94 % O2 Flow Rate (L/min): 3 L/min FiO2 (%): 40 %   CBC    Component Value Date/Time   WBC 25.4 (H) 07/07/2019 0328   RBC 4.61 07/07/2019 0328   HGB 14.0 07/07/2019 0328   HGB 14.9 08/28/2014 0909   HCT 42.8 07/07/2019 0328   HCT 42.3 08/28/2014 0909   PLT 91 (L) 07/07/2019 0328   PLT 141 (L) 08/28/2014 0909   MCV 92.8 07/07/2019 0328   MCV 97 08/28/2014 0909   MCH 30.4 07/07/2019 0328   MCHC 32.7 07/07/2019 0328   RDW 14.0 07/07/2019 0328   RDW 17.4 (H) 08/28/2014 0909   LYMPHSABS 1.1 07/07/2019 0328   LYMPHSABS 0.8 (L) 08/28/2014 0909   MONOABS 1.2 (H) 07/07/2019 0328   MONOABS 0.6 08/28/2014 0909   EOSABS 3.1 (H) 07/07/2019 0328   EOSABS 0.1 08/28/2014 0909   BASOSABS 0.0 07/07/2019 0328   BASOSABS 0.0 08/28/2014 0909   BMP Latest Ref Rng & Units 07/07/2019 07/06/2019 07/05/2019  Glucose  70 - 99 mg/dL 144(H) 103(H) 69(L)  BUN 8 - 23 mg/dL 34(H) 32(H) 33(H)  Creatinine 0.61 - 1.24 mg/dL 1.29(H) 2.21(H) 2.91(H)  Sodium 135 - 145 mmol/L 140 139 136  Potassium 3.5 - 5.1 mmol/L 4.6 4.1 4.5  Chloride 98 - 111 mmol/L 108 108 101  CO2 22 - 32 mmol/L 27 22 21(L)  Calcium 8.9 - 10.3 mg/dL 8.3(L) 8.0(L) 9.0    REVIEW OF SYSTEMS  PATIENT IS UNABLE TO PROVIDE COMPLETE REVIEW OF SYSTEMS DUE TO SEVERE CRITICAL ILLNESS   PHYSICAL EXAMINATION:  GENERAL:critically ill appearing, +resp distress HEAD: Normocephalic, atraumatic.  EYES: Pupils equal, round, reactive to light.  No scleral icterus.  MOUTH: Moist mucosal membrane. NECK: Supple.  PULMONARY: +rhonchi, +wheezing CARDIOVASCULAR: S1 and S2. Regular rate and rhythm. No murmurs, rubs, or gallops.  GASTROINTESTINAL: Soft, nontender, -distended.  Positive bowel sounds.   MUSCULOSKELETAL: No swelling, clubbing, or edema.  NEUROLOGIC: obtunded, GCS<8 SKIN:intact,warm,dry  MEDICATIONS: I have reviewed all medications and confirmed regimen as documented   CULTURE RESULTS   Recent Results (from the past 240 hour(s))  Blood Culture (routine x 2)     Status: None (Preliminary result)   Collection Time: 07/05/19  2:52 PM   Specimen: BLOOD  Result Value Ref Range Status   Specimen Description BLOOD RIGHT ANTECUBITAL  Final   Special  Requests   Final    BOTTLES DRAWN AEROBIC AND ANAEROBIC Blood Culture adequate volume   Culture  Setup Time   Final    IN BOTH AEROBIC AND ANAEROBIC BOTTLES GRAM NEGATIVE RODS CRITICAL VALUE NOTED.  VALUE IS CONSISTENT WITH PREVIOUSLY REPORTED AND CALLED VALUE. Performed at [facility], [address] [place], [place] [address]    Culture GRAM NEGATIVE RODS  Final   Report Status PENDING  Incomplete  Blood Culture (routine x 2)     Status: None (Preliminary result)   Collection Time: 07/05/19  2:57 PM   Specimen: BLOOD  Result Value Ref Range Status   Specimen Description BLOOD LEFT  ANTECUBITAL  Final   Special Requests   Final    BOTTLES DRAWN AEROBIC AND ANAEROBIC Blood Culture adequate volume   Culture  Setup Time   Final    Organism ID to follow ANAEROBIC BOTTLE ONLY GRAM NEGATIVE RODS CRITICAL RESULT CALLED TO, READ BACK BY AND VERIFIED WITH: [place] ON 07/06/19 AT [facility] Performed at [facility], [address], [place], [place] [address]    Culture GRAM NEGATIVE RODS  Final   Report Status PENDING  Incomplete  Blood Culture ID Panel (Reflexed)     Status: Abnormal   Collection Time: 07/05/19  2:57 PM  Result Value Ref Range Status   Enterococcus species NOT DETECTED NOT DETECTED Final   Listeria monocytogenes NOT DETECTED NOT DETECTED Final   Staphylococcus species NOT DETECTED NOT DETECTED Final   Staphylococcus aureus (BCID) NOT DETECTED NOT DETECTED Final   Streptococcus species NOT DETECTED NOT DETECTED Final   Streptococcus agalactiae NOT DETECTED NOT DETECTED Final   Streptococcus pneumoniae NOT DETECTED NOT DETECTED Final   Streptococcus pyogenes NOT DETECTED NOT DETECTED Final   Acinetobacter baumannii NOT DETECTED NOT DETECTED Final   Enterobacteriaceae species DETECTED (A) NOT DETECTED Final    Comment: Enterobacteriaceae represent a large family of gram-negative bacteria, not a single organism. CRITICAL RESULT CALLED TO, READ BACK BY AND VERIFIED WITH: SCOTT [place] ON 07/06/19 AT 0446 [facility]    Enterobacter cloacae complex NOT DETECTED NOT DETECTED Final   Escherichia coli NOT DETECTED NOT DETECTED Final   Klebsiella oxytoca NOT DETECTED NOT DETECTED Final   Klebsiella pneumoniae NOT DETECTED NOT DETECTED Final   Proteus species DETECTED (A) NOT DETECTED Final    Comment: CRITICAL RESULT CALLED TO, READ BACK BY AND VERIFIED WITH: SCOTT [place] ON 07/06/19 AT 0446 [place]    Serratia marcescens NOT DETECTED NOT DETECTED Final   Carbapenem resistance NOT DETECTED NOT DETECTED Final   Haemophilus influenzae NOT DETECTED NOT DETECTED Final    Neisseria meningitidis NOT DETECTED NOT DETECTED Final   Pseudomonas aeruginosa NOT DETECTED NOT DETECTED Final   Candida albicans NOT DETECTED NOT DETECTED Final   Candida glabrata NOT DETECTED NOT DETECTED Final   Candida krusei NOT DETECTED NOT DETECTED Final   Candida parapsilosis NOT DETECTED NOT DETECTED Final   Candida tropicalis NOT DETECTED NOT DETECTED Final    Comment: Performed at [facility], [address] [place], [place] [address]  Urine culture     Status: Abnormal (Preliminary result)   Collection Time: 07/05/19  4:32 PM   Specimen: Urine, Random  Result Value Ref Range Status   Specimen Description   Final    URINE, RANDOM Performed at [facility], [address], [place], [place] [address]    Special Requests   Final    NONE Performed at  [facility], [address], [place], [place] [address]    Culture >=100,000 COLONIES/mL GRAM NEGATIVE RODS (A)  Final   Report Status PENDING  Incomplete  Respiratory Panel by RT PCR (Flu A&B, Covid) - Nasopharyngeal Swab     Status: None   Collection Time: 07/05/19  4:40 PM   Specimen: Nasopharyngeal Swab  Result Value Ref Range Status   SARS Coronavirus 2 by RT PCR NEGATIVE NEGATIVE Final    Comment: (NOTE) SARS-CoV-2 target nucleic acids are NOT DETECTED. The SARS-CoV-2 RNA is generally detectable in upper respiratoy specimens during the acute phase of infection. The lowest concentration of SARS-CoV-2 viral copies this assay can detect is 131 copies/mL. A negative result does not preclude SARS-Cov-2 infection and should not be used as the sole basis for treatment or other patient management decisions. A negative result may occur with  improper specimen collection/handling, submission of specimen other than nasopharyngeal swab, presence of viral mutation(s) within the areas targeted by this assay, and inadequate number of viral copies (<131 copies/mL). A negative result must be combined  with clinical observations, patient history, and epidemiological information. The expected result is Negative. Fact Sheet for Patients:  PinkCheek.be Fact Sheet for Healthcare Providers:  GravelBags.it This test is not yet ap proved or cleared by the [place] FDA and  has been authorized for detection and/or diagnosis of SARS-CoV-2 by FDA under an Emergency Use Authorization (EUA). This EUA will remain  in effect (meaning this test can be used) for the duration of the COVID-19 declaration under Section 564(b)(1) of the Act, 21 U.S.C. section 360bbb-3(b)(1), unless the authorization is terminated or revoked sooner.    Influenza A by PCR NEGATIVE NEGATIVE Final   Influenza B by PCR NEGATIVE NEGATIVE Final    Comment: (NOTE) The Xpert Xpress SARS-CoV-2/FLU/RSV assay is intended as an aid in  the diagnosis of influenza from Nasopharyngeal swab specimens and  should not be used as a sole basis for treatment. Nasal washings and  aspirates are unacceptable for Xpert Xpress SARS-CoV-2/FLU/RSV  testing. Fact Sheet for Patients: PinkCheek.be Fact Sheet for Healthcare Providers: GravelBags.it This test is not yet approved or cleared by the [place] FDA and  has been authorized for detection and/or diagnosis of SARS-CoV-2 by  FDA under an Emergency Use Authorization (EUA). This EUA will remain  in effect (meaning this test can be used) for the duration of the  Covid-19 declaration under Section 564(b)(1) of the Act, 21  U.S.C. section 360bbb-3(b)(1), unless the authorization is  terminated or revoked. Performed at [facility], [address] [place], [place] [address]   MRSA PCR Screening     Status: None   Collection Time: 07/05/19  6:12 PM   Specimen: Nasopharyngeal  Result Value Ref Range Status   MRSA by PCR NEGATIVE NEGATIVE Final    Comment:         The GeneXpert MRSA Assay (FDA approved for NASAL specimens only), is one component of a comprehensive MRSA colonization surveillance program. It is not intended to diagnose MRSA infection nor to guide or monitor treatment for MRSA infections. Performed at [facility], [address] [place], [place] [address]   Culture, respiratory (non-expectorated)     Status: None (Preliminary result)   Collection Time: 07/06/19  3:01 AM   Specimen: Tracheal Aspirate; Respiratory  Result Value Ref Range Status   Specimen Description   Final    TRACHEAL ASPIRATE Performed at [facility], [address],  [place], [place] [address]    Special Requests   Final    Normal Performed at [facility], [address] [place], [place] [address]    Gram Stain   Final    FEW WBC PRESENT, PREDOMINANTLY PMN NO ORGANISMS SEEN Performed at [facility], [place] [address] [place], [place] [address]    Culture PENDING  Incomplete   Report Status PENDING  Incomplete             Indwelling Urinary Catheter continued, requirement due to   Reason to continue Indwelling Urinary Catheter strict Intake/Output monitoring for hemodynamic instability   Central Line/ continued, requirement due to  Reason to continue [place] of central venous pressure or other hemodynamic parameters and poor IV access   Ventilator continued, requirement due to severe respiratory failure   Ventilator Sedation RASS 0 to -2      ASSESSMENT AND PLAN SYNOPSIS   Severe ACUTE Hypoxic and Hypercapnic Respiratory Failure from severe septic shock and severe bacteremia from Pyelonephritis and obstructed kidney stone -continue Full MV support -continue Bronchodilator Therapy -Wean Fio2 and PEEP as tolerated -will perform SAT/SBT when respiratory parameters are met  ACUTE KIDNEY INJURY/Renal Failure -follow chem 7 -follow UO -continue Foley Catheter-assess need -Avoid  nephrotoxic agents -Recheck creatinine    NEUROLOGY - intubated and sedated - minimal sedation to achieve a RASS goal: -1 Wake up assessment pending   SHOCK-SEPSIS/HYPOVOLUMIC/CARDIOGENIC -use vasopressors to keep MAP>65 -follow ABG and LA -stress dose steroids -aggressive IV fluid resuscitation  CARDIAC ICU monitoring  ID -continue IV abx as prescibed -follow up cultures  GI GI PROPHYLAXIS as indicated  NUTRITIONAL STATUS DIET-->NPO Constipation protocol as indicated  ENDO - will use ICU hypoglycemic\Hyperglycemia protocol if indicated   ELECTROLYTES -follow labs as needed -replace as needed -pharmacy consultation and following   DVT/GI PRX ordered TRANSFUSIONS AS NEEDED MONITOR FSBS ASSESS the need for LABS as needed   Critical Care Time devoted to patient care services described in this note is 35 minutes.   Overall, patient is critically ill, prognosis is guarded.  Patient with Multiorgan failure and at high risk for cardiac arrest and death.    Corrin Parker, M.D.  [facility]  Medical Director [place] Director [facility] Cardio-Pulmonary Department

## 2019-07-08 DIAGNOSIS — J441 Chronic obstructive pulmonary disease with (acute) exacerbation: Secondary | ICD-10-CM

## 2019-07-08 LAB — BASIC METABOLIC PANEL
Anion gap: 4 — ABNORMAL LOW (ref 5–15)
BUN: 23 mg/dL (ref 8–23)
CO2: 29 mmol/L (ref 22–32)
Calcium: 7.9 mg/dL — ABNORMAL LOW (ref 8.9–10.3)
Chloride: 108 mmol/L (ref 98–111)
Creatinine, Ser: 0.89 mg/dL (ref 0.61–1.24)
GFR calc Af Amer: >60 mL/min (ref >60)
GFR calc non Af Amer: >60 mL/min (ref >60)
Glucose, Bld: 110 mg/dL — ABNORMAL HIGH (ref 70–99)
Potassium: 3.2 mmol/L — ABNORMAL LOW (ref 3.5–5.1)
Sodium: 141 mmol/L (ref 135–145)

## 2019-07-08 LAB — CBC WITH DIFFERENTIAL/PLATELET
Abs Immature Granulocytes: 0.12 10*3/uL — ABNORMAL HIGH (ref 0.00–0.07)
Basophils Absolute: 0 10*3/uL (ref 0.0–0.1)
Basophils Relative: 0 %
Eosinophils Absolute: 0 10*3/uL (ref 0.0–0.5)
Eosinophils Relative: 0 %
HCT: 39.5 % (ref 39.0–52.0)
Hemoglobin: 13 g/dL (ref 13.0–17.0)
Immature Granulocytes: 1 %
Lymphocytes Relative: 6 %
Lymphs Abs: 0.8 10*3/uL (ref 0.7–4.0)
MCH: 29.8 pg (ref 26.0–34.0)
MCHC: 32.9 g/dL (ref 30.0–36.0)
MCV: 90.6 fL (ref 80.0–100.0)
Monocytes Absolute: 0.6 10*3/uL (ref 0.1–1.0)
Monocytes Relative: 4 %
Neutro Abs: 12 10*3/uL — ABNORMAL HIGH (ref 1.7–7.7)
Neutrophils Relative %: 89 %
Platelets: 76 10*3/uL — ABNORMAL LOW (ref 150–400)
RBC: 4.36 MIL/uL (ref 4.22–5.81)
RDW: 13.8 % (ref 11.5–15.5)
WBC: 13.6 10*3/uL — ABNORMAL HIGH (ref 4.0–10.5)
nRBC: 0 % (ref 0.0–0.2)

## 2019-07-08 LAB — CULTURE, BLOOD (ROUTINE X 2)
Special Requests: ADEQUATE
Special Requests: ADEQUATE

## 2019-07-08 LAB — URINE CULTURE: Culture: 80000 — AB

## 2019-07-08 LAB — CULTURE, RESPIRATORY W GRAM STAIN: Special Requests: NORMAL

## 2019-07-08 LAB — GLUCOSE, CAPILLARY
Glucose-Capillary: 103 mg/dL — ABNORMAL HIGH (ref 70–99)
Glucose-Capillary: 105 mg/dL — ABNORMAL HIGH (ref 70–99)
Glucose-Capillary: 109 mg/dL — ABNORMAL HIGH (ref 70–99)
Glucose-Capillary: 117 mg/dL — ABNORMAL HIGH (ref 70–99)
Glucose-Capillary: 117 mg/dL — ABNORMAL HIGH (ref 70–99)
Glucose-Capillary: 67 mg/dL — ABNORMAL LOW (ref 70–99)
Glucose-Capillary: 98 mg/dL (ref 70–99)

## 2019-07-08 LAB — PHOSPHORUS: Phosphorus: 2.2 mg/dL — ABNORMAL LOW (ref 2.5–4.6)

## 2019-07-08 LAB — MAGNESIUM: Magnesium: 2 mg/dL (ref 1.7–2.4)

## 2019-07-08 MED ORDER — BUDESONIDE 0.5 MG/2ML IN SUSP
0.5000 mg | Freq: Two times a day (BID) | RESPIRATORY_TRACT | Status: DC
  Administered 2019-07-08 – 2019-07-12 (×8): 0.5 mg via RESPIRATORY_TRACT
  Filled 2019-07-08 (×8): qty 2

## 2019-07-08 MED ORDER — LEVOTHYROXINE SODIUM 125 MCG PO TABS
125.0000 ug | ORAL_TABLET | Freq: Every day | ORAL | Status: DC
  Administered 2019-07-08 – 2019-07-12 (×5): 125 ug via ORAL
  Filled 2019-07-08 (×5): qty 1

## 2019-07-08 MED ORDER — POTASSIUM PHOSPHATES 15 MMOLE/5ML IV SOLN
30.0000 mmol | Freq: Once | INTRAVENOUS | Status: AC
  Administered 2019-07-08: 30 mmol via INTRAVENOUS
  Filled 2019-07-08: qty 10

## 2019-07-08 MED ORDER — LEVOTHYROXINE SODIUM 50 MCG PO TABS: 125.0000 ug | ORAL_TABLET | Freq: Every day | ORAL | Status: DC

## 2019-07-08 MED ORDER — POTASSIUM CHLORIDE 2 MEQ/ML IV SOLN
INTRAVENOUS | Status: DC
  Filled 2019-07-08 (×3): qty 1000

## 2019-07-08 MED ORDER — IPRATROPIUM-ALBUTEROL 0.5-2.5 (3) MG/3ML IN SOLN
3.0000 mL | RESPIRATORY_TRACT | Status: DC
  Administered 2019-07-08 – 2019-07-10 (×15): 3 mL via RESPIRATORY_TRACT
  Filled 2019-07-08 (×15): qty 3

## 2019-07-08 MED ORDER — POTASSIUM CHLORIDE 10 MEQ/50ML IV SOLN
10.0000 meq | INTRAVENOUS | Status: AC
  Administered 2019-07-08 (×4): 10 meq via INTRAVENOUS
  Filled 2019-07-08 (×4): qty 50

## 2019-07-08 MED ORDER — LEVOTHYROXINE SODIUM 50 MCG PO TABS
125.0000 ug | ORAL_TABLET | Freq: Every day | ORAL | Status: DC
  Administered 2019-07-08: 125 ug via ORAL
  Filled 2019-07-08: qty 0.5

## 2019-07-08 NOTE — Progress Notes (Addendum)
Rough morning. Very confused and upset.. After all am medications and pm Gabapentin ( for leg pain and numbness) given at lunch time, patient calmer and slept this afternoon. Awoke from nap much calmer and more oriented. Able to carry on a conversation. Told nurse about his previous stroke and rehab. Also about present relationship with [name]. Ate 50% of dinner and resumed nap. No complaints of pain.

## 2019-07-08 NOTE — Progress Notes (Signed)
Patient intermittently resting overnight, remains on precedex gtt at 0.50mcg, remain impulsive at times, easily redirected but refuses po meds. Patient comforted and assured that he was not falling and that he was safe.  See [facility] for further assessment.

## 2019-07-08 NOTE — Progress Notes (Signed)
CRITICAL CARE NOTE 69 y.o.malebelow listed past medical history presents to the ER for evaluation of generalized weakness malaise is progressively worse over the past 2 days. States his been having daily diarrheal episodes.  Was found by EMS to be hypotensive low-grade temperature. +BACTEREMIA  ER course Patient is lethargic, hypotensive, hypoxic Severe septic shock with LA 5 Given 3 L NS Given ABX CODE SEPSIS  SIGNIFICANT EVENTS 3/5 admitted for septic shock 3/6 on vent s/p URETERAL STENT placement 3/6 post op resp failure and severe septic shock 3/6 CVL placed RT IJ 3/7 s/p extubation-post op delerium 3/8 pre-cedex infusion   Antimicrobials this admission: Cefepime/Metronidazole 3/5 x 1 in ED Meropenem 3/5 >> Vancomycin 3/5 >>3/   CULTURES 3/8 TA-->KLEBSIELLA 3/7 UA CX-->PROTEUS 3/6 BLOOD CULTURES-->PROTEUS  CC  Post op resp failure   SUBJECTIVE Remains critically ill Remains delirious  +sepsis Off pressors Minimal oxygen On precedex High risk for re-intubation   BP 137/68   Pulse (!) 56   Temp 97.8 F (36.6 C)   Resp 13   Ht 5\' 10"  (1.778 m)   Wt 128.4 kg   SpO2 90%   BMI 40.62 kg/m    I/O last 3 completed shifts: In: 4532.6 [I.V.:4320.6; IV Piggyback:212] Out: 6100 [Urine:6100] Total I/O In: -  Out: 700 [Urine:700]  SpO2: 90 % O2 Flow Rate (L/min): 5 L/min FiO2 (%): 40 %   CBC    Component Value Date/Time   WBC 13.6 (H) 07/08/2019 0600   RBC 4.36 07/08/2019 0600   HGB 13.0 07/08/2019 0600   HGB 14.9 08/28/2014 0909   HCT 39.5 07/08/2019 0600   HCT 42.3 08/28/2014 0909   PLT 76 (L) 07/08/2019 0600   PLT 141 (L) 08/28/2014 0909   MCV 90.6 07/08/2019 0600   MCV 97 08/28/2014 0909   MCH 29.8 07/08/2019 0600   MCHC 32.9 07/08/2019 0600   RDW 13.8 07/08/2019 0600   RDW 17.4 (H) 08/28/2014 0909   LYMPHSABS 0.8 07/08/2019 0600   LYMPHSABS 0.8 (L) 08/28/2014 0909   MONOABS 0.6 07/08/2019 0600   MONOABS 0.6 08/28/2014 0909    EOSABS 0.0 07/08/2019 0600   EOSABS 0.1 08/28/2014 0909   BASOSABS 0.0 07/08/2019 0600   BASOSABS 0.0 08/28/2014 0909   BMP Latest Ref Rng & Units 07/08/2019 07/07/2019 07/06/2019  Glucose 70 - 99 mg/dL 110(H) 144(H) 103(H)  BUN 8 - 23 mg/dL 23 34(H) 32(H)  Creatinine 0.61 - 1.24 mg/dL 0.89 1.29(H) 2.21(H)  Sodium 135 - 145 mmol/L 141 140 139  Potassium 3.5 - 5.1 mmol/L 3.2(L) 4.6 4.1  Chloride 98 - 111 mmol/L 108 108 108  CO2 22 - 32 mmol/L 29 27 22   Calcium 8.9 - 10.3 mg/dL 7.9(L) 8.3(L) 8.0(L)    LIMITED ROS due to delirium   PHYSICAL EXAMINATION:  GENERAL:critically ill appearing,  HEAD: Normocephalic, atraumatic.  EYES: Pupils equal, round, reactive to light.  No scleral icterus.  MOUTH: Moist mucosal membrane. NECK: Supple. No thyromegaly. No nodules. No JVD.  PULMONARY: +rhonchi,  CARDIOVASCULAR: S1 and S2. Regular rate and rhythm. No murmurs, rubs, or gallops.  GASTROINTESTINAL: Soft, nontender, -distended. Positive bowel sounds.  MUSCULOSKELETAL: No swelling, clubbing, or edema.  NEUROLOGIC: agitated and delerious SKIN:intact,warm,dry     CULTURE RESULTS   Recent Results (from the past 240 hour(s))  Blood Culture (routine x 2)     Status: Abnormal   Collection Time: 07/05/19  2:52 PM   Specimen: BLOOD  Result Value Ref Range Status   Specimen Description  Final    BLOOD RIGHT ANTECUBITAL Performed at [facility], [address] [place], [place] [address]    Special Requests   Final    BOTTLES DRAWN AEROBIC AND ANAEROBIC Blood Culture adequate volume Performed at [facility], [address] [place], [place] [address]    Culture  Setup Time   Final    IN BOTH AEROBIC AND ANAEROBIC BOTTLES GRAM NEGATIVE RODS CRITICAL VALUE NOTED.  VALUE IS CONSISTENT WITH PREVIOUSLY REPORTED AND CALLED VALUE. Performed at [facility], [address] [place], [place] [address]    Culture (A)  Final    PROTEUS MIRABILIS SUSCEPTIBILITIES  PERFORMED ON PREVIOUS CULTURE WITHIN THE LAST 5 DAYS. Performed at [facility], [place] [address], [place], [place] [address]    Report Status 07/08/2019 FINAL  Final  Blood Culture (routine x 2)     Status: Abnormal   Collection Time: 07/05/19  2:57 PM   Specimen: BLOOD  Result Value Ref Range Status   Specimen Description   Final    BLOOD LEFT ANTECUBITAL Performed at [facility], [address], [place], [place] [address]    Special Requests   Final    BOTTLES DRAWN AEROBIC AND ANAEROBIC Blood Culture adequate volume Performed at [facility], [address] [place], [place] [address]    Culture  Setup Time   Final    ANAEROBIC BOTTLE ONLY GRAM NEGATIVE RODS CRITICAL RESULT CALLED TO, READ BACK BY AND VERIFIED WITH: SCOTT HALL ON 07/06/19 AT 0446 [facility] Performed at [facility], [place] [address], [place], [place] [address]    Culture PROTEUS MIRABILIS (A)  Final   Report Status 07/08/2019 FINAL  Final   Organism ID, Bacteria PROTEUS MIRABILIS  Final      Susceptibility   Proteus mirabilis - MIC*    AMPICILLIN >=32 RESISTANT Resistant     CEFAZOLIN >=64 RESISTANT Resistant     CEFEPIME 8 INTERMEDIATE Intermediate     CEFTAZIDIME RESISTANT Resistant     CEFTRIAXONE 32 RESISTANT Resistant     CIPROFLOXACIN >=4 RESISTANT Resistant     GENTAMICIN <=1 SENSITIVE Sensitive     IMIPENEM 2 SENSITIVE Sensitive     TRIMETH/SULFA >=320 RESISTANT Resistant     AMPICILLIN/SULBACTAM >=32 RESISTANT Resistant     PIP/TAZO <=4 SENSITIVE Sensitive     * PROTEUS MIRABILIS  Blood Culture ID Panel (Reflexed)     Status: Abnormal   Collection Time: 07/05/19  2:57 PM  Result Value Ref Range Status   Enterococcus species NOT DETECTED NOT DETECTED Final   Listeria monocytogenes NOT DETECTED NOT DETECTED Final   Staphylococcus species NOT DETECTED NOT DETECTED Final   Staphylococcus aureus (BCID) NOT DETECTED NOT DETECTED Final   Streptococcus species NOT DETECTED NOT  DETECTED Final   Streptococcus agalactiae NOT DETECTED NOT DETECTED Final   Streptococcus pneumoniae NOT DETECTED NOT DETECTED Final   Streptococcus pyogenes NOT DETECTED NOT DETECTED Final   Acinetobacter baumannii NOT DETECTED NOT DETECTED Final   Enterobacteriaceae species DETECTED (A) NOT DETECTED Final    Comment: Enterobacteriaceae represent a large family of gram-negative bacteria, not a single organism. CRITICAL RESULT CALLED TO, READ BACK BY AND VERIFIED WITH: SCOTT HALL ON 07/06/19 AT 0446 [facility]    Enterobacter cloacae complex NOT DETECTED NOT DETECTED Final   Escherichia coli NOT DETECTED NOT DETECTED Final   Klebsiella oxytoca NOT DETECTED NOT DETECTED Final   Klebsiella pneumoniae NOT DETECTED NOT DETECTED Final  Proteus species DETECTED (A) NOT DETECTED Final    Comment: CRITICAL RESULT CALLED TO, READ BACK BY AND VERIFIED WITH: SCOTT HALL ON 07/06/19 AT 0446 [place]    Serratia marcescens NOT DETECTED NOT DETECTED Final   Carbapenem resistance NOT DETECTED NOT DETECTED Final   Haemophilus influenzae NOT DETECTED NOT DETECTED Final   Neisseria meningitidis NOT DETECTED NOT DETECTED Final   Pseudomonas aeruginosa NOT DETECTED NOT DETECTED Final   Candida albicans NOT DETECTED NOT DETECTED Final   Candida glabrata NOT DETECTED NOT DETECTED Final   Candida krusei NOT DETECTED NOT DETECTED Final   Candida parapsilosis NOT DETECTED NOT DETECTED Final   Candida tropicalis NOT DETECTED NOT DETECTED Final    Comment: Performed at [facility], [address] [place], [place] [address]  Urine culture     Status: Abnormal   Collection Time: 07/05/19  4:32 PM   Specimen: Urine, Random  Result Value Ref Range Status   Specimen Description   Final    URINE, RANDOM Performed at [facility], [address] [place], [place] [address]    Special Requests   Final    NONE Performed at [facility], [address], [place], [place] [address]    Culture  >=100,000 COLONIES/mL PROTEUS MIRABILIS (A)  Final   Report Status 07/07/2019 FINAL  Final   Organism ID, Bacteria PROTEUS MIRABILIS (A)  Final      Susceptibility   Proteus mirabilis - MIC*    AMPICILLIN >=32 RESISTANT Resistant     CEFAZOLIN >=64 RESISTANT Resistant     CEFTRIAXONE 32 RESISTANT Resistant     CIPROFLOXACIN >=4 RESISTANT Resistant     GENTAMICIN <=1 SENSITIVE Sensitive     IMIPENEM 4 SENSITIVE Sensitive     NITROFURANTOIN 128 RESISTANT Resistant     TRIMETH/SULFA >=320 RESISTANT Resistant     AMPICILLIN/SULBACTAM 16 INTERMEDIATE Intermediate     PIP/TAZO <=4 SENSITIVE Sensitive     * >=100,000 COLONIES/mL PROTEUS MIRABILIS  Respiratory Panel by RT PCR (Flu A&B, Covid) - Nasopharyngeal Swab     Status: None   Collection Time: 07/05/19  4:40 PM   Specimen: Nasopharyngeal Swab  Result Value Ref Range Status   SARS Coronavirus 2 by RT PCR NEGATIVE NEGATIVE Final    Comment: (NOTE) SARS-CoV-2 target nucleic acids are NOT DETECTED. The SARS-CoV-2 RNA is generally detectable in upper respiratoy specimens during the acute phase of infection. The lowest concentration of SARS-CoV-2 viral copies this assay can detect is 131 copies/mL. A negative result does not preclude SARS-Cov-2 infection and should not be used as the sole basis for treatment or other patient management decisions. A negative result may occur with  improper specimen collection/handling, submission of specimen other than nasopharyngeal swab, presence of viral mutation(s) within the areas targeted by this assay, and inadequate number of viral copies (<131 copies/mL). A negative result must be combined with clinical observations, patient history, and epidemiological information. The expected result is Negative. Fact Sheet for Patients:  PinkCheek.be Fact Sheet for Healthcare Providers:  GravelBags.it This test is not yet ap proved or cleared by the  [place] FDA and  has been authorized for detection and/or diagnosis of SARS-CoV-2 by FDA under an Emergency Use Authorization (EUA). This EUA will remain  in effect (meaning this test can be used) for the duration of the COVID-19 declaration under Section 564(b)(1) of the Act, 21 U.S.C. section 360bbb-3(b)(1), unless the authorization is terminated or revoked sooner.    Influenza A  by PCR NEGATIVE NEGATIVE Final   Influenza B by PCR NEGATIVE NEGATIVE Final    Comment: (NOTE) The Xpert Xpress SARS-CoV-2/FLU/RSV assay is intended as an aid in  the diagnosis of influenza from Nasopharyngeal swab specimens and  should not be used as a sole basis for treatment. Nasal washings and  aspirates are unacceptable for Xpert Xpress SARS-CoV-2/FLU/RSV  testing. Fact Sheet for Patients: PinkCheek.be Fact Sheet for Healthcare Providers: GravelBags.it This test is not yet approved or cleared by the [place] FDA and  has been authorized for detection and/or diagnosis of SARS-CoV-2 by  FDA under an Emergency Use Authorization (EUA). This EUA will remain  in effect (meaning this test can be used) for the duration of the  Covid-19 declaration under Section 564(b)(1) of the Act, 21  U.S.C. section 360bbb-3(b)(1), unless the authorization is  terminated or revoked. Performed at [facility], [address] [place], [place] [address]   MRSA PCR Screening     Status: None   Collection Time: 07/05/19  6:12 PM   Specimen: Nasopharyngeal  Result Value Ref Range Status   MRSA by PCR NEGATIVE NEGATIVE Final    Comment:        The GeneXpert MRSA Assay (FDA approved for NASAL specimens only), is one component of a comprehensive MRSA colonization surveillance program. It is not intended to diagnose MRSA infection nor to guide or monitor treatment for MRSA infections. Performed at [facility], [address]  [place], [place] [address]   Urine Culture     Status: Abnormal (Preliminary result)   Collection Time: 07/05/19 11:25 PM   Specimen: PATH Other; Urine  Result Value Ref Range Status   Specimen Description   Final    CYSTOSCOPY URINE, RANDOM RIGHT RENAL PELVIS Performed at [facility], [address] [place], [place] [address]    Special Requests   Final    NONE Performed at [facility], [address] [place], [place] [address]    Culture 80,000 COLONIES/mL PROTEUS MIRABILIS (A)  Final   Report Status PENDING  Incomplete  Culture, respiratory (non-expectorated)     Status: None   Collection Time: 07/06/19  3:01 AM   Specimen: Tracheal Aspirate; Respiratory  Result Value Ref Range Status   Specimen Description   Final    TRACHEAL ASPIRATE Performed at [facility], [address], [place], [place] [address]    Special Requests   Final    Normal Performed at [facility], [address] [place],  [address]    Gram Stain   Final    FEW WBC PRESENT, PREDOMINANTLY PMN NO ORGANISMS SEEN Performed at [facility], [place] [address] [place],  [address]    Culture RARE KLEBSIELLA PNEUMONIAE  Final   Report Status 07/08/2019 FINAL  Final   Organism ID, Bacteria KLEBSIELLA PNEUMONIAE  Final      Susceptibility   Klebsiella pneumoniae - MIC*    AMPICILLIN >=32 RESISTANT Resistant     CEFAZOLIN <=4 SENSITIVE Sensitive     CEFEPIME <=0.12 SENSITIVE Sensitive     CEFTAZIDIME <=1 SENSITIVE Sensitive     CEFTRIAXONE <=0.25 SENSITIVE Sensitive     CIPROFLOXACIN <=0.25 SENSITIVE Sensitive     GENTAMICIN <=1 SENSITIVE Sensitive     IMIPENEM <=0.25 SENSITIVE Sensitive     TRIMETH/SULFA <=20 SENSITIVE Sensitive     AMPICILLIN/SULBACTAM 8 SENSITIVE Sensitive     PIP/TAZO <=4 SENSITIVE Sensitive     * RARE  KLEBSIELLA PNEUMONIAE             Indwelling Urinary Catheter continued, requirement due to   Reason to continue  Indwelling Urinary Catheter strict Intake/Output monitoring for hemodynamic instability   Central Line/ continued, requirement due to  Reason to continue [place] of central venous pressure or other hemodynamic parameters and poor IV access       ASSESSMENT AND PLAN SYNOPSIS    Severe ACUTE Hypoxic and Hypercapnic Respiratory Failure from severe septic shock and severe bacteremia from Pyelonephritis and obstructed kidney stone Now extubated, +delerium high risk for re-intubation Now on precedex  ACUTE KIDNEY INJURY/Renal Failure -follow chem 7 -follow UO -continue Foley Catheter-assess need -Avoid nephrotoxic agents    NEUROLOGY Severe delirium precedex as needed Restart home meds Nicotine patch   CARDIAC ICU monitoring  ID -continue IV abx as prescibed -follow up cultures Follow up ID recs   GI GI PROPHYLAXIS as indicated  NUTRITIONAL STATUS DIET--> as tolerated Constipation protocol as indicated   ELECTROLYTES -follow labs as needed -replace as needed -pharmacy consultation and following    DVT/GI PRX ordered TRANSFUSIONS AS NEEDED MONITOR FSBS ASSESS the need for LABS as needed    Critical Care Time devoted to patient care services described in this note is 34 minutes.   Overall, patient is critically ill, prognosis is guarded.  Patient with Multiorgan failure and at high risk for cardiac arrest and death.    Corrin Parker, M.D.  [facility]  Medical Director [place] Director [facility] Cardio-Pulmonary Department

## 2019-07-08 NOTE — Progress Notes (Addendum)
1800 Family informed of patients status and clearing confusion.

## 2019-07-08 NOTE — Progress Notes (Signed)
Pharmacy Electrolyte Monitoring Consult:  Pharmacy consulted to assist in monitoring and replacing electrolytes in this 69 y.o. male admitted on 07/05/2019 with Weakness   Labs:  Sodium (mmol/L)  Date Value  07/08/2019 141  08/28/2014 137   Potassium (mmol/L)  Date Value  07/08/2019 3.2 (L)  08/28/2014 3.5   Magnesium (mg/dL)  Date Value  07/08/2019 2.0   Phosphorus (mg/dL)  Date Value  07/08/2019 2.2 (L)   Calcium (mg/dL)  Date Value  07/08/2019 7.9 (L)   Calcium, Total (mg/dL)  Date Value  08/28/2014 9.6   Albumin (g/dL)  Date Value  07/05/2019 3.5  08/28/2014 4.0    Assessment/Plan: Patient ordered potassium 36mEq IV Q 1hr x 4 doses.   Per AM rounds will order potassium phosphate 103mmol IV x 1.   Will replace for goal potassium ~ 4, goal magnesium ~ 2, and goal phosphorus ~ 2.5.   Will obtain electrolytes with am labs.   Pharmacy will continue to monitor and adjust per consult.   Shandria Clinch L 07/08/2019 3:56 PM

## 2019-07-08 NOTE — Evaluation (Signed)
Physical Therapy Evaluation Patient Details Name: Shawn Lindsey MRN: [id number] DOB: 1951/04/09 Today's Date: 07/08/2019   History of Present Illness  Pt is a 69 y.o. male presenting to hospital 07/05/19 with generalized weakness and malaise x2 days; also daily diarrheal episodes.  Pt admitted with severe acute hypoxic and hypercapnic respiratory failure from severe septic shock from UTI and pyelonephritis; AKI; and encephalopathy from sepsis.  Pt s/p 3/6 ureteral stent placement (on vent s/p; extubated 3/7).  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  Clinical Impression  Pt resting in bed upon PT arrival with eyes open.  Pt able to state name and DOB.  Therapist attempted to obtain further information from pt (A&O questions, PLOF, home set-up) but pt repetitively talking about taking his medications on his own and later in session repetitively talking about walking with a cane (instead of answering therapists questions); difficult to redirect pt from these topics.  Pt did participate in UE assessment (inconsistent with 1 step cues though) but had significant difficulty participating in B LE assessment (B DF to neutral PROM but therapist unable to flex pt's hips and knees B d/t pt appearing to resist).  Attempted logrolling to L and R side in bed but pt would start to move B UE's (towards side logrolling towards) and then B UE's and B LE's would extend and pt would get rigid (unable to continue logrolling d/t this).  Pt fatigued with sessions activities and became drowsy end of session and closed his eyes (appearing to fall asleep); pt would open eyes with vc's but would fall right back asleep.  Nurse updated on PT session.  Pt would benefit from skilled PT to address noted impairments and functional limitations (see below for any additional  details).  Upon hospital discharge, pt would benefit from STR.    Follow Up Recommendations SNF    Equipment Recommendations  (TBD at next facility)    Recommendations for Other Services       Precautions / Restrictions Precautions Precautions: Fall Precaution Comments: R IJ CVC Restrictions Weight Bearing Restrictions: No      Mobility  Bed Mobility Overal bed mobility: Needs Assistance Bed Mobility: Rolling Rolling: Total assist         General bed mobility comments: attempted logrolling to L and R side in bed but pt would start to move B UE's (towards side logrolling towards) and then B UE's and B LE's would extend and pt would get rigid (unable to continue logrolling d/t this)  Transfers                 General transfer comment: Deferred  Ambulation/Gait             General Gait Details: Deferred  Stairs            Wheelchair Mobility    Modified Rankin (Stroke Patients Only)       Balance                                             Pertinent Vitals/Pain Pain Assessment: Faces Faces Pain Scale: No hurt Pain Intervention(s): Limited activity within patient's tolerance;Monitored during session;Repositioned  End of session BP 137/68 with HR 58  bpm and O2 92% on 2.5 L O2 via nasal cannula.    Home Living Family/patient expects to be discharged to:: Private residence Living Arrangements: Spouse/significant other(Girlfriend [name])           Home Layout: One level   Additional Comments: Pt unable to verbalize much of home set-up    Prior Function Level of Independence: Independent with assistive device(s)         Comments: Pt reports walking with cane (pt unable to verbalize any other details of PLOF)     Hand Dominance        Extremity/Trunk Assessment   Upper Extremity Assessment Upper Extremity Assessment: (strong B hand grip strength; at least 3/5 AROM elbow flexion/extension and shoulder  flexion (to grossly 80 degrees))    Lower Extremity Assessment Lower Extremity Assessment: (at least 3/5 B ankle DF AROM to neutral (PROM also to neutral DF); pt did not follow rest of commands to assess LE's and pt resisted with attempted B hip and knee flexion ROM)    Cervical / Trunk Assessment Cervical / Trunk Assessment: (appearing kyphotic in bed with forward shoulders/head)  Communication   Communication: Expressive difficulties(pt often repetitive "I take my medicine myself" or "I walk with a cane".)  Cognition Arousal/Alertness: (Initially awake but drowsy end of session) Behavior During Therapy: Flat affect Overall Cognitive Status: No family/caregiver present to determine baseline cognitive functioning                                 General Comments: Oriented to person only      General Comments   Nursing cleared pt for participation in physical therapy.  Pt agreeable to PT session.    Exercises  Bed mobility (logrolling)   Assessment/Plan    PT Assessment Patient needs continued PT services  PT Problem List Decreased strength;Decreased range of motion;Decreased activity tolerance;Decreased balance;Decreased mobility;Decreased cognition;Decreased knowledge of use of DME;Decreased coordination;Decreased safety awareness;Decreased knowledge of precautions;Cardiopulmonary status limiting activity;Obesity       PT Treatment Interventions DME instruction;Gait training;Functional mobility training;Therapeutic activities;Therapeutic exercise;Balance training;Patient/family education    PT Goals (Current goals can be found in the Care Plan section)  Acute Rehab PT Goals Patient Stated Goal: pt did not state goal when asked PT Goal Formulation: Patient unable to participate in goal setting Time For Goal Achievement: 07/22/19 Potential to Achieve Goals: Fair    Frequency Min 2X/week   Barriers to discharge Decreased caregiver support      Co-evaluation                AM-PAC PT "6 Clicks" Mobility  Outcome Measure Help needed turning from your back to your side while in a flat bed without using bedrails?: Total Help needed moving from lying on your back to sitting on the side of a flat bed without using bedrails?: Total Help needed moving to and from a bed to a chair (including a wheelchair)?: Total Help needed standing up from a chair using your arms (e.g., wheelchair or bedside chair)?: Total Help needed to walk in hospital room?: Total Help needed climbing 3-5 steps with a railing? : Total 6 Click Score: 6    End of Session Equipment Utilized During Treatment: Oxygen(2.5 L via nasal cannula) Activity Tolerance: Patient limited by fatigue Patient left: in bed;with call bell/phone within reach;with bed alarm set;Other (comment);with SCD's reapplied(B heels floating via pillow) Nurse Communication: Mobility status;Precautions PT Visit Diagnosis: Other  abnormalities of gait and mobility (R26.89);Muscle weakness (generalized) (M62.81);Difficulty in walking, not elsewhere classified (R26.2)    Time: 9276-3943 PT Time Calculation (min) (ACUTE ONLY): 20 min   Charges:   PT Evaluation $PT Eval Low Complexity: 1 Low PT Treatments $Therapeutic Activity: 8-22 mins       Leitha Bleak, PT 07/08/19, 1:35 PM

## 2019-07-09 DIAGNOSIS — J9601 Acute respiratory failure with hypoxia: Secondary | ICD-10-CM

## 2019-07-09 LAB — GLUCOSE, CAPILLARY
Glucose-Capillary: 106 mg/dL — ABNORMAL HIGH (ref 70–99)
Glucose-Capillary: 120 mg/dL — ABNORMAL HIGH (ref 70–99)
Glucose-Capillary: 68 mg/dL — ABNORMAL LOW (ref 70–99)
Glucose-Capillary: 68 mg/dL — ABNORMAL LOW (ref 70–99)
Glucose-Capillary: 69 mg/dL — ABNORMAL LOW (ref 70–99)
Glucose-Capillary: 73 mg/dL (ref 70–99)
Glucose-Capillary: 94 mg/dL (ref 70–99)
Glucose-Capillary: 96 mg/dL (ref 70–99)

## 2019-07-09 LAB — CBC WITH DIFFERENTIAL/PLATELET
Abs Immature Granulocytes: 0.16 10*3/uL — ABNORMAL HIGH (ref 0.00–0.07)
Basophils Absolute: 0 10*3/uL (ref 0.0–0.1)
Basophils Relative: 0 %
Eosinophils Absolute: 0.1 10*3/uL (ref 0.0–0.5)
Eosinophils Relative: 1 %
HCT: 40.9 % (ref 39.0–52.0)
Hemoglobin: 13.9 g/dL (ref 13.0–17.0)
Immature Granulocytes: 1 %
Lymphocytes Relative: 11 %
Lymphs Abs: 1.4 10*3/uL (ref 0.7–4.0)
MCH: 30.2 pg (ref 26.0–34.0)
MCHC: 34 g/dL (ref 30.0–36.0)
MCV: 88.9 fL (ref 80.0–100.0)
Monocytes Absolute: 0.8 10*3/uL (ref 0.1–1.0)
Monocytes Relative: 7 %
Neutro Abs: 9.4 10*3/uL — ABNORMAL HIGH (ref 1.7–7.7)
Neutrophils Relative %: 80 %
Platelets: 85 10*3/uL — ABNORMAL LOW (ref 150–400)
RBC: 4.6 MIL/uL (ref 4.22–5.81)
RDW: 13.6 % (ref 11.5–15.5)
WBC: 11.8 10*3/uL — ABNORMAL HIGH (ref 4.0–10.5)
nRBC: 0 % (ref 0.0–0.2)

## 2019-07-09 LAB — BASIC METABOLIC PANEL
Anion gap: 9 (ref 5–15)
BUN: 18 mg/dL (ref 8–23)
CO2: 31 mmol/L (ref 22–32)
Calcium: 8.7 mg/dL — ABNORMAL LOW (ref 8.9–10.3)
Chloride: 103 mmol/L (ref 98–111)
Creatinine, Ser: 0.81 mg/dL (ref 0.61–1.24)
GFR calc Af Amer: >60 mL/min (ref >60)
GFR calc non Af Amer: >60 mL/min (ref >60)
Glucose, Bld: 76 mg/dL (ref 70–99)
Potassium: 2.8 mmol/L — ABNORMAL LOW (ref 3.5–5.1)
Sodium: 143 mmol/L (ref 135–145)

## 2019-07-09 LAB — MAGNESIUM: Magnesium: 1.9 mg/dL (ref 1.7–2.4)

## 2019-07-09 LAB — PHOSPHORUS: Phosphorus: 1.8 mg/dL — ABNORMAL LOW (ref 2.5–4.6)

## 2019-07-09 MED ORDER — DEXTROSE 50 % IV SOLN: 25.0000 mL | Freq: Once | INTRAVENOUS | Status: AC

## 2019-07-09 MED ORDER — FAMOTIDINE 20 MG PO TABS
20.0000 mg | ORAL_TABLET | Freq: Every day | ORAL | Status: DC
  Administered 2019-07-09 – 2019-07-11 (×3): 20 mg via ORAL
  Filled 2019-07-09 (×3): qty 1

## 2019-07-09 MED ORDER — POTASSIUM PHOSPHATES 15 MMOLE/5ML IV SOLN
20.0000 mmol | Freq: Once | INTRAVENOUS | Status: AC
  Administered 2019-07-09: 20 mmol via INTRAVENOUS
  Filled 2019-07-09: qty 6.67

## 2019-07-09 MED ORDER — POTASSIUM CHLORIDE 20 MEQ PO PACK
40.0000 meq | PACK | Freq: Once | ORAL | Status: AC
  Administered 2019-07-09: 40 meq via ORAL
  Filled 2019-07-09: qty 2

## 2019-07-09 MED ORDER — OXYCODONE-ACETAMINOPHEN 5-325 MG PO TABS
1.0000 | ORAL_TABLET | Freq: Four times a day (QID) | ORAL | Status: DC | PRN
  Administered 2019-07-09 – 2019-07-12 (×4): 1 via ORAL
  Filled 2019-07-09 (×4): qty 1

## 2019-07-09 MED ORDER — FUROSEMIDE 10 MG/ML IJ SOLN
20.0000 mg | Freq: Once | INTRAMUSCULAR | Status: AC
  Administered 2019-07-09: 20 mg via INTRAVENOUS
  Filled 2019-07-09: qty 2

## 2019-07-09 MED ORDER — ENOXAPARIN SODIUM 40 MG/0.4ML ~~LOC~~ SOLN
40.0000 mg | Freq: Every day | SUBCUTANEOUS | Status: DC
  Administered 2019-07-09 – 2019-07-11 (×3): 40 mg via SUBCUTANEOUS
  Filled 2019-07-09 (×3): qty 0.4

## 2019-07-09 MED ORDER — DEXTROSE 10 % IV SOLN: INTRAVENOUS | Status: DC

## 2019-07-09 MED ORDER — DEXTROSE 50 % IV SOLN
INTRAVENOUS | Status: AC
  Administered 2019-07-09: 25 mL via INTRAVENOUS
  Filled 2019-07-09: qty 50

## 2019-07-09 NOTE — Evaluation (Signed)
Occupational Therapy Evaluation Patient Details Name: Shawn Lindsey MRN: [id number] DOB: 1951/04/30 Today's Date: 07/09/2019    History of Present Illness Pt is a 69 y.o. male presenting to hospital 07/05/19 with generalized weakness and malaise x2 days; also daily diarrheal episodes.  Pt admitted with severe acute hypoxic and hypercapnic respiratory failure from severe septic shock from UTI and pyelonephritis; AKI; and encephalopathy from sepsis.  Pt s/p 3/6 ureteral stent placement (on vent s/p; extubated 3/7).  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   Clinical Impression   Patient seen this date for OT evaluation.  Previously, pt was living with his girlfriend and able to complete BADLs and functional mobility at [facility].  Girlfriend, [name], states he has been falling at home.  Patient very lethargic and girlfriend states he recently received pain medication which could be effecting his level of alertness.  Patient oriented to self and that he was in the hospital.  Requesting to use restroom, nurse recommended use of bedpan vs BSC this date d/t BP dropping.  Potassium was also noted to be low in the AM, but nurse states pt received replacements and was able to participate in evaluation.  Completed log rolling with MIN A and some hand over hand assist for sequencing.  TOTAL A needed for pericare.  Patient having difficulty remembering when an activity began or ended (e.g. had the bedpan been removed, did he need pericare, etc).  Required +2 to reposition in bed for safety and comfort.  Provided education to pt and girlfriend on role and goals of OT in acute care.  Verbalized understanding.  Pt would benefit from continued occupational therapy services to address strengthening, activity tolerance, safety awareness, standing tolerance, EC  techniques, and ADL retraining.  Based on today's performance, recommended SNF for next level of care.    Follow Up Recommendations  SNF    Equipment Recommendations  Other (comment)(defer to next level of care)    Recommendations for Other Services       Precautions / Restrictions Precautions Precautions: Fall Precaution Comments: R IJ CVC; monitor BP Restrictions Weight Bearing Restrictions: No      Mobility Bed Mobility Overal bed mobility: Needs Assistance Bed Mobility: Rolling Rolling: Min assist      General bed mobility comments: Patient able to log roll successfully with hand over hand assist for use of bed pan.  Required +2 to scoot up in bed and position.  Transfers                 General transfer comment: Unable to assess d/t BP    Balance                                    ADL either performed or assessed with clinical judgement   ADL Overall ADL's : Needs assistance/impaired     Grooming: Wash/dry hands;Wash/dry face;Sitting;Supervision/safety Grooming Details (indicate cue type and reason): Requires SPV and cues to maintain attention to task             Lower Body Dressing: Maximal assistance;Bed level     Toilet Transfer Details (indicate cue type and reason): Unable to assess   Toileting - Clothing Manipulation Details (indicate cue type and reason): TOTAL A for  pericare after use of bedpan       General ADL Comments: Unable to fully assess ADL carryover due to lethargy and confusion     Vision Patient Visual Report: No change from baseline       Perception     Praxis      Pertinent Vitals/Pain Pain Assessment: No/denies pain     Hand Dominance Right   Extremity/Trunk Assessment Upper Extremity Assessment Upper Extremity Assessment: Generalized weakness(Noted edema on ventral part of B hands.  Good gross grasp.  Weakness noted especially in shoulders and upper back.)   Lower Extremity  Assessment Lower Extremity Assessment: Defer to PT evaluation       Communication Communication Communication: Other (comment)(Patient very lethargic and required tactile cues to stay awake during session.)   Cognition Arousal/Alertness: Lethargic;Suspect due to medications(Patient recently had pain medication) Behavior During Therapy: [facility] for tasks assessed/performed Overall Cognitive Status: Impaired/Different from baseline Area of Impairment: Orientation;Memory;Following commands;Awareness                 Orientation Level: Person;Place   Memory: Decreased recall of precautions;Decreased short-term memory Following Commands: Follows one step commands inconsistently       General Comments: Patient with poor memory.  Frequently forgetting an activity had ceased, thinking it was continuing. (e.g. off the bedpan, removing lead, etc)   General Comments  Patient continues with confusion.  Able to answer questions with yes/no 50% of the time.  Memory deficits noted.  Inconsistency with following 1 step instructions.    Exercises Total Joint Exercises Ankle Circles/Pumps: AROM;15 reps;Supine Other Exercises Other Exercises: Educated pt and girlfriend on role and goals of OT in acute care setting Other Exercises: Patient participated in log rolling for bed pan.  Nurse recommended using bedpan this date instead of BSC d/t BP.  Participated in positioning with 2 assist Other Exercises: Completed education on sequencing and body mechanics for effective use of bedpan Other Exercises: Completed AROM of B UEs in all planes with added resistance from therapist. 2 sets of 10.   Shoulder Instructions      Home Living Family/patient expects to be discharged to:: Private residence Living Arrangements: Spouse/significant other([name] (girlfriend)) Available Help at Discharge: Friend(s) Type of Home: House Home Access: Ramped entrance     [place]: One level     Bathroom  Shower/Tub: Tub/shower unit(Girlfriend reports he has taken bed baths/baths at sink for the past year)   Biochemist, clinical: Standard     Home Equipment: Environmental consultant - 2 wheels;Grab bars - tub/shower   Additional Comments: Information taken from [place]      Prior Functioning/Environment Level of Independence: Independent with assistive device(s)        Comments: Utilizes RW for functional mobility.  Patient able to perform basic self care tasks.  Girlfriend assisted with homemaking/community tasks.        OT Problem List: Decreased strength;Decreased activity tolerance;Impaired balance (sitting and/or standing);Decreased safety awareness;Decreased cognition      OT Treatment/Interventions: Self-care/ADL training;Therapeutic exercise;Energy conservation;Therapeutic activities;Patient/family education    OT Goals(Current goals can be found in the care plan section) Acute Rehab OT Goals Patient Stated Goal: "get out of here" OT Goal Formulation: With patient Time For Goal Achievement: 07/23/19 Potential to Achieve Goals: Good  OT Frequency: Min 1X/week   Barriers to D/C:            Co-evaluation              AM-PAC OT "6 Clicks" Daily Activity  Outcome Measure Help from another person eating meals?: A Little Help from another person taking care of personal grooming?: A Little Help from another person toileting, which includes using toliet, bedpan, or urinal?: A Lot Help from another person bathing (including washing, rinsing, drying)?: A Lot Help from another person to put on and taking off regular upper body clothing?: A Lot Help from another person to put on and taking off regular lower body clothing?: A Lot 6 Click Score: 14   End of Session Nurse Communication: Mobility status;Other (comment)(Nurse recommending use of bedpan this date instead of BSC.  Discussed potassium levels and lab values.)  Activity Tolerance: Patient limited by lethargy Patient left: in  bed;with call bell/phone within reach;with family/visitor present  OT Visit Diagnosis: History of falling (Z91.81);Muscle weakness (generalized) (M62.81)                Time: 1401-1420 OT Time Calculation (min): 19 min Charges:  OT General Charges $OT Visit: 1 Visit OT Evaluation $OT Eval Moderate Complexity: 1 Mod OT Treatments $Self Care/Home Management : 8-22 mins  Baldomero Lamy, MS, OTR/L 07/09/19, 3:43 PM

## 2019-07-09 NOTE — Progress Notes (Addendum)
CRITICAL CARE NOTE  CRITICAL CARE NOTE 69 y.o.malebelow listed past medical history presents to the ER for evaluation of generalized weakness malaise is progressively worse over the past 2 days. States his been having daily diarrheal episodes. Was found by EMS to be hypotensive low-grade temperature. +PROTEUS BACTEREMIA  ER course Patient is lethargic, hypotensive, hypoxic Severe septic shock with LA 5 Given 3 L NS Given ABX CODE SEPSIS  SIGNIFICANT EVENTS 3/5 admitted for septic shock 3/6 on vent s/p URETERAL STENT placement 3/6 post op resp failure and severe septic shock 3/6 CVL placed RT IJ 3/7 s/p extubation-post op delerium 3/8 precedex infusion 3/9 less confused, off precedex  Antimicrobials this admission: Cefepime/Metronidazole 3/5 x 1 in ED Meropenem 3/5 >>3/8 Vancomycin 3/5 >>3/7 ZOSYN 3/8-->   CULTURES 3/8 TA-->KLEBSIELLA 3/7 UA CX-->PROTEUS 3/6 BLOOD CULTURES-->PROTEUS    CC  follow up respiratory failure Follow up delerium  SUBJECTIVE Remains SD status More alert and awake Follows commands    BP 133/88 (BP Location: Right Arm)   Pulse 91   Temp 98 F (36.7 C) (Axillary)   Resp 20   Ht 5\' 10"  (1.778 m)   Wt 121.1 kg   SpO2 94%   BMI 38.31 kg/m    I/O last 3 completed shifts: In: 7253 [P.O.:250; I.V.:1859.1; IV Piggyback:1021.8] Out: 8650 [Urine:8650] Total I/O In: 111.6 [I.V.:56; IV Piggyback:55.6] Out: 700 [Urine:700]  SpO2: 94 % O2 Flow Rate (L/min): 5 L/min FiO2 (%): 40 %   SIGNIFICANT EVENTS   REVIEW OF SYSTEMS-  Review of Systems: Gen:  Denies  fever, sweats, chills weight loss  HEENT: Denies blurred vision, double vision, ear pain, eye pain, hearing loss, nose bleeds, sore throat Cardiac:  No dizziness, chest pain or heaviness, chest tightness,edema, No JVD Resp:   No cough, -sputum production, -shortness of breath,-wheezing, -hemoptysis,  Other:  All other systems negative     PHYSICAL  EXAMINATION:  GENERAL:critically ill appearing HEAD: Normocephalic, atraumatic.  EYES: Pupils equal, round, reactive to light.  No scleral icterus.  MOUTH: Moist mucosal membrane. NECK: Supple.  PULMONARY: +rhonchi, CARDIOVASCULAR: S1 and S2. Regular rate and rhythm. No murmurs, rubs, or gallops.  GASTROINTESTINAL: Soft, nontender, -distended.  Positive bowel sounds.   MUSCULOSKELETAL: No swelling, clubbing, or edema.  NEUROLOGIC: more alert and awake, slightly disoreinted SKIN:intact,warm,dry  MEDICATIONS: I have reviewed all medications and confirmed regimen as documented   CULTURE RESULTS   Recent Results (from the past 240 hour(s))  Blood Culture (routine x 2)     Status: Abnormal   Collection Time: 07/05/19  2:52 PM   Specimen: BLOOD  Result Value Ref Range Status   Specimen Description   Final    BLOOD RIGHT ANTECUBITAL Performed at [facility], [address], [place], [place] [address]    Special Requests   Final    BOTTLES DRAWN AEROBIC AND ANAEROBIC Blood Culture adequate volume Performed at [facility], [address] [place], [place] [address]    Culture  Setup Time   Final    IN BOTH AEROBIC AND ANAEROBIC BOTTLES GRAM NEGATIVE RODS CRITICAL VALUE NOTED.  VALUE IS CONSISTENT WITH PREVIOUSLY REPORTED AND CALLED VALUE. Performed at [facility], [address] [place], [place] [address]    Culture (A)  Final    PROTEUS MIRABILIS SUSCEPTIBILITIES PERFORMED ON PREVIOUS CULTURE WITHIN THE LAST 5 DAYS. Performed at [facility], [place] [address], [place], [place] [address]    Report Status 07/08/2019 FINAL  Final  Blood Culture (routine x 2)     Status: Abnormal   Collection Time: 07/05/19  2:57 PM   Specimen: BLOOD  Result Value Ref Range Status   Specimen Description   Final    BLOOD LEFT ANTECUBITAL Performed at [facility], [address], [place], [place] [address]    Special Requests   Final    BOTTLES  DRAWN AEROBIC AND ANAEROBIC Blood Culture adequate volume Performed at [facility], [address] [place], [place] [address]    Culture  Setup Time   Final    ANAEROBIC BOTTLE ONLY GRAM NEGATIVE RODS CRITICAL RESULT CALLED TO, READ BACK BY AND VERIFIED WITH: SCOTT HALL ON 07/06/19 AT 0446 [facility] Performed at [facility], [place] [address] [place], [place] [address]    Culture PROTEUS MIRABILIS (A)  Final   Report Status 07/08/2019 FINAL  Final   Organism ID, Bacteria PROTEUS MIRABILIS  Final      Susceptibility   Proteus mirabilis - MIC*    AMPICILLIN >=32 RESISTANT Resistant     CEFAZOLIN >=64 RESISTANT Resistant     CEFEPIME 8 INTERMEDIATE Intermediate     CEFTAZIDIME RESISTANT Resistant     CEFTRIAXONE 32 RESISTANT Resistant     CIPROFLOXACIN >=4 RESISTANT Resistant     GENTAMICIN <=1 SENSITIVE Sensitive     IMIPENEM 2 SENSITIVE Sensitive     TRIMETH/SULFA >=320 RESISTANT Resistant     AMPICILLIN/SULBACTAM >=32 RESISTANT Resistant     PIP/TAZO <=4 SENSITIVE Sensitive     * PROTEUS MIRABILIS  Blood Culture ID Panel (Reflexed)     Status: Abnormal   Collection Time: 07/05/19  2:57 PM  Result Value Ref Range Status   Enterococcus species NOT DETECTED NOT DETECTED Final   Listeria monocytogenes NOT DETECTED NOT DETECTED Final   Staphylococcus species NOT DETECTED NOT DETECTED Final   Staphylococcus aureus (BCID) NOT DETECTED NOT DETECTED Final   Streptococcus species NOT DETECTED NOT DETECTED Final   Streptococcus agalactiae NOT DETECTED NOT DETECTED Final   Streptococcus pneumoniae NOT DETECTED NOT DETECTED Final   Streptococcus pyogenes NOT DETECTED NOT DETECTED Final   Acinetobacter baumannii NOT DETECTED NOT DETECTED Final   Enterobacteriaceae species DETECTED (A) NOT DETECTED Final    Comment: Enterobacteriaceae represent a large family of gram-negative bacteria, not a single organism. CRITICAL RESULT CALLED TO, READ BACK BY AND VERIFIED WITH: SCOTT HALL ON  07/06/19 AT 0446 [facility]    Enterobacter cloacae complex NOT DETECTED NOT DETECTED Final   Escherichia coli NOT DETECTED NOT DETECTED Final   Klebsiella oxytoca NOT DETECTED NOT DETECTED Final   Klebsiella pneumoniae NOT DETECTED NOT DETECTED Final   Proteus species DETECTED (A) NOT DETECTED Final    Comment: CRITICAL RESULT CALLED TO, READ BACK BY AND VERIFIED WITH: SCOTT HALL ON 07/06/19 AT 0446 [facility]    Serratia marcescens NOT DETECTED NOT DETECTED Final   Carbapenem resistance NOT DETECTED NOT DETECTED Final   Haemophilus influenzae NOT DETECTED NOT DETECTED Final   Neisseria meningitidis NOT DETECTED NOT DETECTED Final   Pseudomonas aeruginosa NOT DETECTED NOT DETECTED Final   Candida albicans NOT DETECTED NOT DETECTED Final   Candida glabrata NOT DETECTED NOT DETECTED Final   Candida krusei NOT DETECTED NOT DETECTED Final   Candida parapsilosis NOT DETECTED NOT DETECTED Final   Candida tropicalis NOT DETECTED NOT DETECTED Final    Comment: Performed at [facility], [address], [place],  [address]  Urine culture  Status: Abnormal   Collection Time: 07/05/19  4:32 PM   Specimen: Urine, Random  Result Value Ref Range Status   Specimen Description   Final    URINE, RANDOM Performed at [facility], [address] [place], [place] [address]    Special Requests   Final    NONE Performed at [facility], [address] [place], [place] [address]    Culture >=100,000 COLONIES/mL PROTEUS MIRABILIS (A)  Final   Report Status 07/07/2019 FINAL  Final   Organism ID, Bacteria PROTEUS MIRABILIS (A)  Final      Susceptibility   Proteus mirabilis - MIC*    AMPICILLIN >=32 RESISTANT Resistant     CEFAZOLIN >=64 RESISTANT Resistant     CEFTRIAXONE 32 RESISTANT Resistant     CIPROFLOXACIN >=4 RESISTANT Resistant     GENTAMICIN <=1 SENSITIVE Sensitive     IMIPENEM 4 SENSITIVE Sensitive     NITROFURANTOIN 128 RESISTANT Resistant     TRIMETH/SULFA  >=320 RESISTANT Resistant     AMPICILLIN/SULBACTAM 16 INTERMEDIATE Intermediate     PIP/TAZO <=4 SENSITIVE Sensitive     * >=100,000 COLONIES/mL PROTEUS MIRABILIS  Respiratory Panel by RT PCR (Flu A&B, Covid) - Nasopharyngeal Swab     Status: None   Collection Time: 07/05/19  4:40 PM   Specimen: Nasopharyngeal Swab  Result Value Ref Range Status   SARS Coronavirus 2 by RT PCR NEGATIVE NEGATIVE Final    Comment: (NOTE) SARS-CoV-2 target nucleic acids are NOT DETECTED. The SARS-CoV-2 RNA is generally detectable in upper respiratoy specimens during the acute phase of infection. The lowest concentration of SARS-CoV-2 viral copies this assay can detect is 131 copies/mL. A negative result does not preclude SARS-Cov-2 infection and should not be used as the sole basis for treatment or other patient management decisions. A negative result may occur with  improper specimen collection/handling, submission of specimen other than nasopharyngeal swab, presence of viral mutation(s) within the areas targeted by this assay, and inadequate number of viral copies (<131 copies/mL). A negative result must be combined with clinical observations, patient history, and epidemiological information. The expected result is Negative. Fact Sheet for Patients:  PinkCheek.be Fact Sheet for Healthcare Providers:  GravelBags.it This test is not yet ap proved or cleared by the [place] FDA and  has been authorized for detection and/or diagnosis of SARS-CoV-2 by FDA under an Emergency Use Authorization (EUA). This EUA will remain  in effect (meaning this test can be used) for the duration of the COVID-19 declaration under Section 564(b)(1) of the Act, 21 U.S.C. section 360bbb-3(b)(1), unless the authorization is terminated or revoked sooner.    Influenza A by PCR NEGATIVE NEGATIVE Final   Influenza B by PCR NEGATIVE NEGATIVE Final    Comment:  (NOTE) The Xpert Xpress SARS-CoV-2/FLU/RSV assay is intended as an aid in  the diagnosis of influenza from Nasopharyngeal swab specimens and  should not be used as a sole basis for treatment. Nasal washings and  aspirates are unacceptable for Xpert Xpress SARS-CoV-2/FLU/RSV  testing. Fact Sheet for Patients: PinkCheek.be Fact Sheet for Healthcare Providers: GravelBags.it This test is not yet approved or cleared by the [place] FDA and  has been authorized for detection and/or diagnosis of SARS-CoV-2 by  FDA under an Emergency Use Authorization (EUA). This EUA will remain  in effect (meaning this test can be used) for the duration of the  Covid-19 declaration under Section 564(b)(1) of the Act, 21  U.S.C. section 360bbb-3(b)(1), unless the  authorization is  terminated or revoked. Performed at [facility], [address] [place], [place] [address]   MRSA PCR Screening     Status: None   Collection Time: 07/05/19  6:12 PM   Specimen: Nasopharyngeal  Result Value Ref Range Status   MRSA by PCR NEGATIVE NEGATIVE Final    Comment:        The GeneXpert MRSA Assay (FDA approved for NASAL specimens only), is one component of a comprehensive MRSA colonization surveillance program. It is not intended to diagnose MRSA infection nor to guide or monitor treatment for MRSA infections. Performed at [facility], [address] [place], [place] [address]   Urine Culture     Status: Abnormal   Collection Time: 07/05/19 11:25 PM   Specimen: PATH Other; Urine  Result Value Ref Range Status   Specimen Description   Final    CYSTOSCOPY URINE, RANDOM RIGHT RENAL PELVIS Performed at [facility], [address] [place], [place] [address]    Special Requests   Final    NONE Performed at [facility], [place], [place] [address]    Culture 80,000 COLONIES/mL PROTEUS  MIRABILIS (A)  Final   Report Status 07/08/2019 FINAL  Final   Organism ID, Bacteria PROTEUS MIRABILIS (A)  Final      Susceptibility   Proteus mirabilis - MIC*    AMPICILLIN >=32 RESISTANT Resistant     CEFAZOLIN >=64 RESISTANT Resistant     CEFEPIME 8 INTERMEDIATE Intermediate     CEFTAZIDIME RESISTANT Resistant     CEFTRIAXONE 32 RESISTANT Resistant     CIPROFLOXACIN >=4 RESISTANT Resistant     GENTAMICIN <=1 SENSITIVE Sensitive     IMIPENEM 2 SENSITIVE Sensitive     TRIMETH/SULFA >=320 RESISTANT Resistant     AMPICILLIN/SULBACTAM >=32 RESISTANT Resistant     PIP/TAZO <=4 SENSITIVE Sensitive     * 80,000 COLONIES/mL PROTEUS MIRABILIS  Culture, respiratory (non-expectorated)     Status: None   Collection Time: 07/06/19  3:01 AM   Specimen: Tracheal Aspirate; Respiratory  Result Value Ref Range Status   Specimen Description   Final    TRACHEAL ASPIRATE Performed at [facility], [address], [place], [place] [address]    Special Requests   Final    Normal Performed at [facility], [address] [place], [place] [address]    Gram Stain   Final    FEW WBC PRESENT, PREDOMINANTLY PMN NO ORGANISMS SEEN Performed at [facility], [place] [address], [place],  [address]    Culture RARE KLEBSIELLA PNEUMONIAE  Final   Report Status 07/08/2019 FINAL  Final   Organism ID, Bacteria KLEBSIELLA PNEUMONIAE  Final      Susceptibility   Klebsiella pneumoniae - MIC*    AMPICILLIN >=32 RESISTANT Resistant     CEFAZOLIN <=4 SENSITIVE Sensitive     CEFEPIME <=0.12 SENSITIVE Sensitive     CEFTAZIDIME <=1 SENSITIVE Sensitive     CEFTRIAXONE <=0.25 SENSITIVE Sensitive     CIPROFLOXACIN <=0.25 SENSITIVE Sensitive     GENTAMICIN <=1 SENSITIVE Sensitive     IMIPENEM <=0.25 SENSITIVE Sensitive     TRIMETH/SULFA <=20 SENSITIVE Sensitive     AMPICILLIN/SULBACTAM 8 SENSITIVE Sensitive     PIP/TAZO <=4 SENSITIVE Sensitive     * RARE KLEBSIELLA PNEUMONIAE     BMP Latest Ref Rng & Units 07/09/2019 07/08/2019 07/07/2019  Glucose 70 - 99 mg/dL 76 110(H) 144(H)  BUN  8 - 23 mg/dL 18 23 34(H)  Creatinine 0.61 - 1.24 mg/dL 0.81 0.89 1.29(H)  Sodium 135 - 145 mmol/L 143 141 140  Potassium 3.5 - 5.1 mmol/L 2.8(L) 3.2(L) 4.6  Chloride 98 - 111 mmol/L 103 108 108  CO2 22 - 32 mmol/L 31 29 27   Calcium 8.9 - 10.3 mg/dL 8.7(L) 7.9(L) 8.3(L)      Indwelling Urinary Catheter continued, requirement due to   Reason to continue Indwelling Urinary Catheter strict Intake/Output monitoring for hemodynamic instability   Central Line/ continued, requirement due to  Reason to continue [place] of central venous pressure or other hemodynamic parameters and poor IV access      ASSESSMENT AND PLAN SYNOPSIS  Severe ACUTE Hypoxic and Hypercapnic Respiratory Failure from severe sepsis from  septic shock and severe bacteremia from Pyelonephritis and obstructed kidney stone Now extubated, complicated by post op delirium but slowly resolving  Acute +KLEBSIELLA PNEUMONIA Continue IV abx  Severe ACUTE Hypoxic and Hypercapnic Respiratory Failure resolving   ACUTE KIDNEY INJURY/Renal Failure due to  sepsis -resolving  NEUROLOGY Avoid sedatives precedex as needed  SD monitoring  ID -continue IV abx as prescibed -follow up cultures  GI GI PROPHYLAXIS as indicated  NUTRITIONAL STATUS DIET--> as tolerated Constipation protocol as indicated  obesity Estimated body mass index is 38.31 kg/m as calculated from the following:   Height as of this encounter: 5\' 10"  (1.778 m).   Weight as of this encounter: 121.1 kg.    ENDO - will use ICU hypoglycemic\Hyperglycemia protocol if indicated   ELECTROLYTES -follow labs as needed -replace as needed -pharmacy consultation and following   DVT/GI PRX ordered TRANSFUSIONS AS NEEDED MONITOR FSBS ASSESS the need for LABS as needed  OOB to chair today as tolerated   Corrin Parker,  M.D.  [facility]  Medical Director [place] Director [place] Department

## 2019-07-09 NOTE — Progress Notes (Signed)
Physical Therapy Treatment Patient Details Name: Shawn Lindsey MRN: [id number] DOB: 12-14-1950 Today's Date: 07/09/2019    History of Present Illness Pt is a 69 y.o. male presenting to hospital 07/05/19 with generalized weakness and malaise x2 days; also daily diarrheal episodes.  Pt admitted with severe acute hypoxic and hypercapnic respiratory failure from severe septic shock from UTI and pyelonephritis; AKI; and encephalopathy from sepsis.  Pt s/p 3/6 ureteral stent placement (on vent s/p; extubated 3/7).  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    PT Comments    Pt resting in bed upon PT arrival with significant other present.  Improved mental status noted (although pt still noted with confusion); pt appearing anxious during session and consistently concerned regarding falling.  2 assist semi-supine to/from sitting edge of bed.  Pt appearing shaky with UE's sitting on edge of bed requiring CGA for safety; pt then reporting dizziness sitting edge of bed.  Pt's BP 145/67 resting in bed but 111/52 sitting edge of bed.  Nurse present and notified of pt's decrease in BP and symptoms and pt assisted back to bed (pt reporting symptoms resolved with rest in bed).  Will continue to focus on strengthening and progressive functional mobility per pt tolerance.   Follow Up Recommendations  SNF     Equipment Recommendations  Rolling walker with 5" wheels;3in1 (PT);Wheelchair (measurements PT);Wheelchair cushion (measurements PT)    Recommendations for Other Services       Precautions / Restrictions Precautions Precautions: Fall Precaution Comments: R IJ CVC; monitor BP Restrictions Weight Bearing Restrictions: No    Mobility  Bed Mobility Overal bed mobility: Needs Assistance Bed Mobility: Supine to Sit;Sit to Supine     Supine  to sit: Mod assist;+2 for physical assistance;HOB elevated Sit to supine: Mod assist;+2 for physical assistance;HOB elevated   General bed mobility comments: assist for trunk and B LE's semi-supine to/from sitting edge of bed; min to mod assist x2 to scoot to edge of bed in sitting; 2 assist to boost pt up in bed end of session using bed sheet  Transfers                 General transfer comment: Deferred d/t pt dizzy in sitting  Ambulation/Gait                 Stairs             Wheelchair Mobility    Modified Rankin (Stroke Patients Only)       Balance Overall balance assessment: Needs assistance Sitting-balance support: Bilateral upper extremity supported;Feet supported Sitting balance-Leahy Scale: Fair Sitting balance - Comments: steady static sitting (CGA for safety)                                    Cognition Arousal/Alertness: Awake/alert Behavior During Therapy: Anxious Overall Cognitive Status: Impaired/Different from baseline                                 General Comments: Inconsistent with following 1 step commands      Exercises      General Comments   Nursing cleared pt for participation in physical therapy.  Pt agreeable  to PT session.  Pt's girlfriend [name] present during sessions activities.       Pertinent Vitals/Pain Pain Assessment: Faces Pain Intervention(s): Limited activity within patient's tolerance;Monitored during session;Repositioned    Home Living                      Prior Function            PT Goals (current goals can now be found in the care plan section) Acute Rehab PT Goals Patient Stated Goal: to not fall PT Goal Formulation: With patient Time For Goal Achievement: 07/22/19 Potential to Achieve Goals: Fair Progress towards PT goals: Progressing toward goals    Frequency    Min 2X/week      PT Plan Current plan remains appropriate    Co-evaluation               AM-PAC PT "6 Clicks" Mobility   Outcome Measure  Help needed turning from your back to your side while in a flat bed without using bedrails?: A Lot Help needed moving from lying on your back to sitting on the side of a flat bed without using bedrails?: Total Help needed moving to and from a bed to a chair (including a wheelchair)?: Total Help needed standing up from a chair using your arms (e.g., wheelchair or bedside chair)?: Total Help needed to walk in hospital room?: Total Help needed climbing 3-5 steps with a railing? : Total 6 Click Score: 7    End of Session Equipment Utilized During Treatment: Oxygen(5 L O2 via nasal cannula) Activity Tolerance: Patient limited by fatigue Patient left: in bed;with call bell/phone within reach;with bed alarm set;with family/visitor present;Other (comment)(B heels floating via pillow; B UE's elevated via pillows) Nurse Communication: Mobility status;Precautions;Other (comment)(Pt's BP during session) PT Visit Diagnosis: Other abnormalities of gait and mobility (R26.89);Muscle weakness (generalized) (M62.81);Difficulty in walking, not elsewhere classified (R26.2)     Time: 2774-1287 PT Time Calculation (min) (ACUTE ONLY): 28 min  Charges:  $Therapeutic Activity: 23-37 mins                     Leitha Bleak, PT 07/09/19, 12:18 PM

## 2019-07-09 NOTE — Progress Notes (Signed)
Pharmacy Electrolyte Monitoring Consult:  Pharmacy consulted to assist in monitoring and replacing electrolytes in this 69 y.o. male admitted on 07/05/2019 with Weakness   Labs:  Sodium (mmol/L)  Date Value  07/09/2019 143  08/28/2014 137   Potassium (mmol/L)  Date Value  07/09/2019 2.8 (L)  08/28/2014 3.5   Magnesium (mg/dL)  Date Value  07/09/2019 1.9   Phosphorus (mg/dL)  Date Value  07/09/2019 1.8 (L)   Calcium (mg/dL)  Date Value  07/09/2019 8.7 (L)   Calcium, Total (mg/dL)  Date Value  08/28/2014 9.6   Albumin (g/dL)  Date Value  07/05/2019 3.5  08/28/2014 4.0    Assessment/Plan: Patient ordered potassium phosphate 82mmol IV x 1. Will order additional potassium 47mEq PO x 1.   Will replace for goal potassium ~ 4, goal magnesium ~ 2, and goal phosphorus ~ 2.5.   Will obtain electrolytes with am labs.   Pharmacy will continue to monitor and adjust per consult.   Fabrizio Filip L 07/09/2019 5:16 PM

## 2019-07-10 LAB — GLUCOSE, CAPILLARY
Glucose-Capillary: 107 mg/dL — ABNORMAL HIGH (ref 70–99)
Glucose-Capillary: 100 mg/dL — ABNORMAL HIGH (ref 70–99)
Glucose-Capillary: 99 mg/dL (ref 70–99)
Glucose-Capillary: 96 mg/dL (ref 70–99)
Glucose-Capillary: 106 mg/dL — ABNORMAL HIGH (ref 70–99)
Glucose-Capillary: 110 mg/dL — ABNORMAL HIGH (ref 70–99)
Glucose-Capillary: 105 mg/dL — ABNORMAL HIGH (ref 70–99)

## 2019-07-10 LAB — BASIC METABOLIC PANEL
Anion gap: 11 (ref 5–15)
BUN: 16 mg/dL (ref 8–23)
CO2: 30 mmol/L (ref 22–32)
Calcium: 8.2 mg/dL — ABNORMAL LOW (ref 8.9–10.3)
Chloride: 97 mmol/L — ABNORMAL LOW (ref 98–111)
Creatinine, Ser: 0.96 mg/dL (ref 0.61–1.24)
GFR calc Af Amer: >60 mL/min (ref >60)
GFR calc non Af Amer: >60 mL/min (ref >60)
Glucose, Bld: 99 mg/dL (ref 70–99)
Potassium: 2.7 mmol/L — CL (ref 3.5–5.1)
Sodium: 138 mmol/L (ref 135–145)

## 2019-07-10 LAB — CBC
HCT: 44.1 % (ref 39.0–52.0)
Hemoglobin: 14.9 g/dL (ref 13.0–17.0)
MCH: 29.7 pg (ref 26.0–34.0)
MCHC: 33.8 g/dL (ref 30.0–36.0)
MCV: 88 fL (ref 80.0–100.0)
Platelets: 93 10*3/uL — ABNORMAL LOW (ref 150–400)
RBC: 5.01 MIL/uL (ref 4.22–5.81)
RDW: 13.4 % (ref 11.5–15.5)
WBC: 12.8 10*3/uL — ABNORMAL HIGH (ref 4.0–10.5)
nRBC: 0 % (ref 0.0–0.2)

## 2019-07-10 LAB — PHOSPHORUS: Phosphorus: 2.8 mg/dL (ref 2.5–4.6)

## 2019-07-10 LAB — MAGNESIUM: Magnesium: 2 mg/dL (ref 1.7–2.4)

## 2019-07-10 LAB — POTASSIUM: Potassium: 3.6 mmol/L (ref 3.5–5.1)

## 2019-07-10 MED ORDER — POTASSIUM CHLORIDE CRYS ER 20 MEQ PO TBCR: 40.0000 meq | EXTENDED_RELEASE_TABLET | Freq: Two times a day (BID) | ORAL | Status: DC

## 2019-07-10 MED ORDER — POTASSIUM CHLORIDE CRYS ER 20 MEQ PO TBCR
40.0000 meq | EXTENDED_RELEASE_TABLET | Freq: Two times a day (BID) | ORAL | Status: DC
  Administered 2019-07-10: 40 meq via ORAL
  Filled 2019-07-10: qty 2

## 2019-07-10 MED ORDER — POTASSIUM CHLORIDE CRYS ER 20 MEQ PO TBCR
40.0000 meq | EXTENDED_RELEASE_TABLET | ORAL | Status: AC
  Administered 2019-07-10 (×2): 40 meq via ORAL
  Filled 2019-07-10 (×2): qty 2

## 2019-07-10 MED ORDER — ATORVASTATIN CALCIUM 80 MG PO TABS
80.0000 mg | ORAL_TABLET | Freq: Every day | ORAL | Status: DC
  Administered 2019-07-10 – 2019-07-11 (×2): 80 mg via ORAL
  Filled 2019-07-10: qty 4
  Filled 2019-07-10: qty 1
  Filled 2019-07-10: qty 4
  Filled 2019-07-10 (×2): qty 1

## 2019-07-10 MED ORDER — IPRATROPIUM-ALBUTEROL 0.5-2.5 (3) MG/3ML IN SOLN
3.0000 mL | Freq: Three times a day (TID) | RESPIRATORY_TRACT | Status: DC
  Administered 2019-07-11 – 2019-07-12 (×4): 3 mL via RESPIRATORY_TRACT
  Filled 2019-07-10 (×4): qty 3

## 2019-07-10 NOTE — Progress Notes (Signed)
CRITICAL CARE NOTE  69 y.o.malebelow listed past medical history presents to the ER for evaluation of generalized weakness malaise is progressively worse over the past 2 days. States his been having daily diarrheal episodes. Was found by EMS to be hypotensive low-grade temperature. +PROTEUS BACTEREMIA  ER course Patient is lethargic, hypotensive, hypoxic Severe septic shock with LA 5 Given 3 L NS Given ABX CODE SEPSIS  SIGNIFICANT EVENTS 3/5 admitted for septic shock 3/6 on vent s/p URETERAL STENT placement 3/6 post op resp failure and severe septic shock 3/6 CVL placed RT IJ 3/7 s/p extubation-post op delerium 3/8 precedex infusion 3/9 less confused, off precedex 3/10 tolerating diet, less confused, off pressors and precedex last 2 days  Antimicrobials this admission: Cefepime/Metronidazole 3/5 x 1 in ED Meropenem 3/5 >>3/8 Vancomycin 3/5 >>3/7 ZOSYN 3/8-->   CULTURES 3/8TA-->KLEBSIELLA 3/7 UA CX-->PROTEUS 3/6 BLOOD CULTURES-->PROTEUS     CC  follow up respiratory failure  HPI Alert and awake Very weak Being treated with ABX for PROTEUS bacteremia    BP (!) 109/56   Pulse 73   Temp 98 F (36.7 C) (Axillary)   Resp 10   Ht 5\' 10"  (1.778 m)   Wt 118.8 kg   SpO2 95%   BMI 37.58 kg/m    I/O last 3 completed shifts: In: 1285.3 [I.V.:204.8; IV Piggyback:1080.5] Out: 9300 [Urine:9300] No intake/output data recorded.  SpO2: 95 % O2 Flow Rate (L/min): 5 L/min FiO2 (%): 40 %  Estimated body mass index is 37.58 kg/m as calculated from the following:   Height as of this encounter: 5\' 10"  (1.778 m).   Weight as of this encounter: 118.8 kg.   Review of Systems:  Gen:  Denies  fever, sweats, chills weight loss  HEENT: Denies blurred vision, double vision, ear pain, eye pain, hearing loss, nose bleeds, sore throat Cardiac:  No dizziness, chest pain or heaviness, chest tightness,edema, No JVD Resp:   No cough, -sputum production, -shortness  of breath,-wheezing, -hemoptysis,  Other:  All other systems negative      Physical Examination:   General Appearance: No distress  Neuro:without focal findings,  speech normal,  HEENT: PERRLA, EOM intact.   Pulmonary: normal breath sounds, No wheezing.  CardiovascularNormal S1,S2.  No m/r/g.   Abdomen: Benign, Soft, non-tender. Renal:  No costovertebral tenderness  GU:  Not performed at this time. Endoc: No evident thyromegaly Skin:   warm, no rashes, no ecchymosis  Extremities: normal, no cyanosis, clubbing. PSYCHIATRIC: Mood, affect within normal limits.   ALL OTHER ROS ARE NEGATIVE   MEDICATIONS: I have reviewed all medications and confirmed regimen as documented  CBC    Component Value Date/Time   WBC 12.8 (H) 07/10/2019 0348   RBC 5.01 07/10/2019 0348   HGB 14.9 07/10/2019 0348   HGB 14.9 08/28/2014 0909   HCT 44.1 07/10/2019 0348   HCT 42.3 08/28/2014 0909   PLT 93 (L) 07/10/2019 0348   PLT 141 (L) 08/28/2014 0909   MCV 88.0 07/10/2019 0348   MCV 97 08/28/2014 0909   MCH 29.7 07/10/2019 0348   MCHC 33.8 07/10/2019 0348   RDW 13.4 07/10/2019 0348   RDW 17.4 (H) 08/28/2014 0909   LYMPHSABS 1.4 07/09/2019 0547   LYMPHSABS 0.8 (L) 08/28/2014 0909   MONOABS 0.8 07/09/2019 0547   MONOABS 0.6 08/28/2014 0909   EOSABS 0.1 07/09/2019 0547   EOSABS 0.1 08/28/2014 0909   BASOSABS 0.0 07/09/2019 0547   BASOSABS 0.0 08/28/2014 0909   BMP Latest Ref Rng &  Units 07/10/2019 07/09/2019 07/08/2019  Glucose 70 - 99 mg/dL 99 76 110(H)  BUN 8 - 23 mg/dL 16 18 23   Creatinine 0.61 - 1.24 mg/dL 0.96 0.81 0.89  Sodium 135 - 145 mmol/L 138 143 141  Potassium 3.5 - 5.1 mmol/L 2.7(LL) 2.8(L) 3.2(L)  Chloride 98 - 111 mmol/L 97(L) 103 108  CO2 22 - 32 mmol/L 30 31 29   Calcium 8.9 - 10.3 mg/dL 8.2(L) 8.7(L) 7.9(L)     CULTURE RESULTS   Recent Results (from the past 240 hour(s))  Blood Culture (routine x 2)     Status: Abnormal   Collection Time: 07/05/19  2:52 PM   Specimen:  BLOOD  Result Value Ref Range Status   Specimen Description   Final    BLOOD RIGHT ANTECUBITAL Performed at [facility], [address] [place], [place] [address]    Special Requests   Final    BOTTLES DRAWN AEROBIC AND ANAEROBIC Blood Culture adequate volume Performed at [facility], [address] [place], [place] [address]    Culture  Setup Time   Final    IN BOTH AEROBIC AND ANAEROBIC BOTTLES GRAM NEGATIVE RODS CRITICAL VALUE NOTED.  VALUE IS CONSISTENT WITH PREVIOUSLY REPORTED AND CALLED VALUE. Performed at [facility], [address] [place], [place] [address]    Culture (A)  Final    PROTEUS MIRABILIS SUSCEPTIBILITIES PERFORMED ON PREVIOUS CULTURE WITHIN THE LAST 5 DAYS. Performed at [facility], [place] [address], [place], [place] [address]    Report Status 07/08/2019 FINAL  Final  Blood Culture (routine x 2)     Status: Abnormal   Collection Time: 07/05/19  2:57 PM   Specimen: BLOOD  Result Value Ref Range Status   Specimen Description   Final    BLOOD LEFT ANTECUBITAL Performed at [facility], [address] [place], [place] [address]    Special Requests   Final    BOTTLES DRAWN AEROBIC AND ANAEROBIC Blood Culture adequate volume Performed at [facility], [address] [place], [place] [address]    Culture  Setup Time   Final    ANAEROBIC BOTTLE ONLY GRAM NEGATIVE RODS CRITICAL RESULT CALLED TO, READ BACK BY AND VERIFIED WITH: SCOTT HALL ON 07/06/19 AT 0446 [facility] Performed at [facility], [place] [address] [place], [place] [address]    Culture PROTEUS MIRABILIS (A)  Final   Report Status 07/08/2019 FINAL  Final   Organism ID, Bacteria PROTEUS MIRABILIS  Final      Susceptibility   Proteus mirabilis - MIC*    AMPICILLIN >=32 RESISTANT Resistant     CEFAZOLIN >=64 RESISTANT Resistant     CEFEPIME 8 INTERMEDIATE Intermediate     CEFTAZIDIME RESISTANT Resistant     CEFTRIAXONE 32 RESISTANT  Resistant     CIPROFLOXACIN >=4 RESISTANT Resistant     GENTAMICIN <=1 SENSITIVE Sensitive     IMIPENEM 2 SENSITIVE Sensitive     TRIMETH/SULFA >=320 RESISTANT Resistant     AMPICILLIN/SULBACTAM >=32 RESISTANT Resistant     PIP/TAZO <=4 SENSITIVE Sensitive     * PROTEUS MIRABILIS  Blood Culture ID Panel (Reflexed)     Status: Abnormal   Collection Time: 07/05/19  2:57 PM  Result Value Ref Range Status   Enterococcus species NOT DETECTED NOT DETECTED Final   Listeria monocytogenes NOT DETECTED NOT DETECTED Final   Staphylococcus species NOT DETECTED NOT DETECTED Final   Staphylococcus aureus (BCID) NOT DETECTED NOT DETECTED  Final   Streptococcus species NOT DETECTED NOT DETECTED Final   Streptococcus agalactiae NOT DETECTED NOT DETECTED Final   Streptococcus pneumoniae NOT DETECTED NOT DETECTED Final   Streptococcus pyogenes NOT DETECTED NOT DETECTED Final   Acinetobacter baumannii NOT DETECTED NOT DETECTED Final   Enterobacteriaceae species DETECTED (A) NOT DETECTED Final    Comment: Enterobacteriaceae represent a large family of gram-negative bacteria, not a single organism. CRITICAL RESULT CALLED TO, READ BACK BY AND VERIFIED WITH: SCOTT HALL ON 07/06/19 AT 0446 [facility]    Enterobacter cloacae complex NOT DETECTED NOT DETECTED Final   Escherichia coli NOT DETECTED NOT DETECTED Final   Klebsiella oxytoca NOT DETECTED NOT DETECTED Final   Klebsiella pneumoniae NOT DETECTED NOT DETECTED Final   Proteus species DETECTED (A) NOT DETECTED Final    Comment: CRITICAL RESULT CALLED TO, READ BACK BY AND VERIFIED WITH: SCOTT HALL ON 07/06/19 AT 0446 [place]    Serratia marcescens NOT DETECTED NOT DETECTED Final   Carbapenem resistance NOT DETECTED NOT DETECTED Final   Haemophilus influenzae NOT DETECTED NOT DETECTED Final   Neisseria meningitidis NOT DETECTED NOT DETECTED Final   Pseudomonas aeruginosa NOT DETECTED NOT DETECTED Final   Candida albicans NOT DETECTED NOT DETECTED Final   Candida  glabrata NOT DETECTED NOT DETECTED Final   Candida krusei NOT DETECTED NOT DETECTED Final   Candida parapsilosis NOT DETECTED NOT DETECTED Final   Candida tropicalis NOT DETECTED NOT DETECTED Final    Comment: Performed at [facility], [address], [place], [place] [address]  Urine culture     Status: Abnormal   Collection Time: 07/05/19  4:32 PM   Specimen: Urine, Random  Result Value Ref Range Status   Specimen Description   Final    URINE, RANDOM Performed at [facility], [address] [place], [place] [address]    Special Requests   Final    NONE Performed at [facility], [address] [place], [place] [address]    Culture >=100,000 COLONIES/mL PROTEUS MIRABILIS (A)  Final   Report Status 07/07/2019 FINAL  Final   Organism ID, Bacteria PROTEUS MIRABILIS (A)  Final      Susceptibility   Proteus mirabilis - MIC*    AMPICILLIN >=32 RESISTANT Resistant     CEFAZOLIN >=64 RESISTANT Resistant     CEFTRIAXONE 32 RESISTANT Resistant     CIPROFLOXACIN >=4 RESISTANT Resistant     GENTAMICIN <=1 SENSITIVE Sensitive     IMIPENEM 4 SENSITIVE Sensitive     NITROFURANTOIN 128 RESISTANT Resistant     TRIMETH/SULFA >=320 RESISTANT Resistant     AMPICILLIN/SULBACTAM 16 INTERMEDIATE Intermediate     PIP/TAZO <=4 SENSITIVE Sensitive     * >=100,000 COLONIES/mL PROTEUS MIRABILIS  Respiratory Panel by RT PCR (Flu A&B, Covid) - Nasopharyngeal Swab     Status: None   Collection Time: 07/05/19  4:40 PM   Specimen: Nasopharyngeal Swab  Result Value Ref Range Status   SARS Coronavirus 2 by RT PCR NEGATIVE NEGATIVE Final    Comment: (NOTE) SARS-CoV-2 target nucleic acids are NOT DETECTED. The SARS-CoV-2 RNA is generally detectable in upper respiratoy specimens during the acute phase of infection. The lowest concentration of SARS-CoV-2 viral copies this assay can detect is 131 copies/mL. A negative result does not preclude SARS-Cov-2 infection and should not  be used as the sole basis for treatment or other patient management decisions. A negative result may occur with  improper specimen collection/handling, submission of specimen other than nasopharyngeal  swab, presence of viral mutation(s) within the areas targeted by this assay, and inadequate number of viral copies (<131 copies/mL). A negative result must be combined with clinical observations, patient history, and epidemiological information. The expected result is Negative. Fact Sheet for Patients:  PinkCheek.be Fact Sheet for Healthcare Providers:  GravelBags.it This test is not yet ap proved or cleared by the [place] FDA and  has been authorized for detection and/or diagnosis of SARS-CoV-2 by FDA under an Emergency Use Authorization (EUA). This EUA will remain  in effect (meaning this test can be used) for the duration of the COVID-19 declaration under Section 564(b)(1) of the Act, 21 U.S.C. section 360bbb-3(b)(1), unless the authorization is terminated or revoked sooner.    Influenza A by PCR NEGATIVE NEGATIVE Final   Influenza B by PCR NEGATIVE NEGATIVE Final    Comment: (NOTE) The Xpert Xpress SARS-CoV-2/FLU/RSV assay is intended as an aid in  the diagnosis of influenza from Nasopharyngeal swab specimens and  should not be used as a sole basis for treatment. Nasal washings and  aspirates are unacceptable for Xpert Xpress SARS-CoV-2/FLU/RSV  testing. Fact Sheet for Patients: PinkCheek.be Fact Sheet for Healthcare Providers: GravelBags.it This test is not yet approved or cleared by the [place] FDA and  has been authorized for detection and/or diagnosis of SARS-CoV-2 by  FDA under an Emergency Use Authorization (EUA). This EUA will remain  in effect (meaning this test can be used) for the duration of the  Covid-19 declaration under Section 564(b)(1) of  the Act, 21  U.S.C. section 360bbb-3(b)(1), unless the authorization is  terminated or revoked. Performed at [facility], [address] [place], [place] [address]   MRSA PCR Screening     Status: None   Collection Time: 07/05/19  6:12 PM   Specimen: Nasopharyngeal  Result Value Ref Range Status   MRSA by PCR NEGATIVE NEGATIVE Final    Comment:        The GeneXpert MRSA Assay (FDA approved for NASAL specimens only), is one component of a comprehensive MRSA colonization surveillance program. It is not intended to diagnose MRSA infection nor to guide or monitor treatment for MRSA infections. Performed at [facility], [address] [place], [place] [address]   Urine Culture     Status: Abnormal   Collection Time: 07/05/19 11:25 PM   Specimen: PATH Other; Urine  Result Value Ref Range Status   Specimen Description   Final    CYSTOSCOPY URINE, RANDOM RIGHT RENAL PELVIS Performed at [facility], [address], [place], [place] [address]    Special Requests   Final    NONE Performed at [facility], [place], [place] [address]    Culture 80,000 COLONIES/mL PROTEUS MIRABILIS (A)  Final   Report Status 07/08/2019 FINAL  Final   Organism ID, Bacteria PROTEUS MIRABILIS (A)  Final      Susceptibility   Proteus mirabilis - MIC*    AMPICILLIN >=32 RESISTANT Resistant     CEFAZOLIN >=64 RESISTANT Resistant     CEFEPIME 8 INTERMEDIATE Intermediate     CEFTAZIDIME RESISTANT Resistant     CEFTRIAXONE 32 RESISTANT Resistant     CIPROFLOXACIN >=4 RESISTANT Resistant     GENTAMICIN <=1 SENSITIVE Sensitive     IMIPENEM 2 SENSITIVE Sensitive     TRIMETH/SULFA >=320 RESISTANT Resistant     AMPICILLIN/SULBACTAM >=32 RESISTANT Resistant     PIP/TAZO <=4 SENSITIVE Sensitive     * 80,000  COLONIES/mL PROTEUS MIRABILIS  Culture, respiratory (non-expectorated)     Status: None   Collection Time: 07/06/19  3:01 AM   Specimen:  Tracheal Aspirate; Respiratory  Result Value Ref Range Status   Specimen Description   Final    TRACHEAL ASPIRATE Performed at [facility], [address] [place], [place] [address]    Special Requests   Final    Normal Performed at [facility], [address] [place], [place] [address]    Gram Stain   Final    FEW WBC PRESENT, PREDOMINANTLY PMN NO ORGANISMS SEEN Performed at [facility], [place] [address] [place],  [address]    Culture RARE KLEBSIELLA PNEUMONIAE  Final   Report Status 07/08/2019 FINAL  Final   Organism ID, Bacteria KLEBSIELLA PNEUMONIAE  Final      Susceptibility   Klebsiella pneumoniae - MIC*    AMPICILLIN >=32 RESISTANT Resistant     CEFAZOLIN <=4 SENSITIVE Sensitive     CEFEPIME <=0.12 SENSITIVE Sensitive     CEFTAZIDIME <=1 SENSITIVE Sensitive     CEFTRIAXONE <=0.25 SENSITIVE Sensitive     CIPROFLOXACIN <=0.25 SENSITIVE Sensitive     GENTAMICIN <=1 SENSITIVE Sensitive     IMIPENEM <=0.25 SENSITIVE Sensitive     TRIMETH/SULFA <=20 SENSITIVE Sensitive     AMPICILLIN/SULBACTAM 8 SENSITIVE Sensitive     PIP/TAZO <=4 SENSITIVE Sensitive     * RARE KLEBSIELLA PNEUMONIAE          Indwelling Urinary Catheter continued, requirement due to   Reason to continue Indwelling Urinary Catheter strict Intake/Output monitoring for hemodynamic instability     ASSESSMENT AND PLAN SYNOPSIS  Severe ACUTE Hypoxic and Hypercapnic Respiratory Failure from severe sepsis from  septic shock and severe bacteremia from Pyelonephritis and obstructed kidney stone  ACUTE RESP FAILURE-DUE TO SEPTIC SHOCK RESOLVED  ACUTE KLEBSIELLA PNEUMONIA CONTINUE IV ABX  S/P extubation Minimal oxygen   ACUTE KIDNEY INJURY/Renal Failure-RESOLVED  NEUROLOGY Acute delirium resolved    SHOCK-SEPSIS/HYPOVOLUMIC/CARDIOGENIC-resolved  ID -continue IV abx as prescibed -follow up cultures  GI GI PROPHYLAXIS as indicated DIET as tolerated        OBESITY Estimated body mass index is 37.58 kg/m as calculated from the following:   Height as of this encounter: 5\' 10"  (1.778 m).   Weight as of this encounter: 118.8 kg.    ENDO - will use ICU hypoglycemic\Hyperglycemia protocol if indicated   ELECTROLYTES -follow labs as needed -replace as needed -pharmacy consultation and following   DVT/GI PRX ordered TRANSFUSIONS AS NEEDED MONITOR FSBS ASSESS the need for LABS as needed  TRANSFER TO Scott, M.D.  [facility]  Medical Director [place] Director [place] Department

## 2019-07-10 NOTE — Progress Notes (Signed)
Physical Therapy Treatment Patient Details Name: Shawn Lindsey MRN: [id number] DOB: 11/07/1950 Today's Date: 07/10/2019    History of Present Illness Pt is a 69 y.o. male presenting to hospital 07/05/19 with generalized weakness and malaise x2 days; also daily diarrheal episodes.  Pt admitted with severe acute hypoxic and hypercapnic respiratory failure from severe septic shock from UTI and pyelonephritis; AKI; and encephalopathy from sepsis.  Pt s/p 3/6 ureteral stent placement (on vent s/p; extubated 3/7).  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    PT Comments    Pt sitting on edge of bed with OT present upon PT arrival; performed OT/PT co-treatment.   Improved cognition noted today but pt appearing anxious with activities.  Mod to max assist x2 to stand up to RW with B knees and feet blocked and bed height elevated: able to stand for approximately 20 seconds before needing seated rest break.  O2 sats 86-87% on 4 L O2 via nasal cannula with activity but improved to >90% within a minute of seated rest break.  Mod assist x2 to assist pt back to bed and reposition pt for comfort.  Will continue to focus on strengthening and progressive functional mobility per pt tolerance.   Follow Up Recommendations  SNF     Equipment Recommendations  Rolling walker with 5" wheels;3in1 (PT);Wheelchair (measurements PT);Wheelchair cushion (measurements PT)    Recommendations for Other Services       Precautions / Restrictions Precautions Precautions: Fall Precaution Comments: Monitor BP and O2 Restrictions Weight Bearing Restrictions: No    Mobility  Bed Mobility Overal bed mobility: Needs Assistance         Sit to supine: Mod assist;+2 for physical assistance   General bed mobility comments: assist for trunk and B LE's sit to  semi-supine in bed; 2 assist to boost pt up in bed end of session via bed sheet  Transfers Overall transfer level: Needs assistance Equipment used: Rolling walker (2 wheeled) Transfers: Sit to/from Stand Sit to Stand: Mod assist;Max assist;+2 physical assistance;From elevated surface         General transfer comment: B knee and foot blocking; vc's for technique; assist to initiate and come to standing; assist to control descent sitting back onto bed  Ambulation/Gait             General Gait Details: Not appropriate at this time   Stairs             Wheelchair Mobility    Modified Rankin (Stroke Patients Only)       Balance Overall balance assessment: Needs assistance Sitting-balance support: Bilateral upper extremity supported;Feet supported Sitting balance-Leahy Scale: Fair Sitting balance - Comments: static sitting CGA for safety     Standing balance-Leahy Scale: Poor Standing balance comment: Mod assist x2 standing with B UE support on RW                            Cognition Arousal/Alertness: Awake/alert Behavior During Therapy: Anxious Overall Cognitive Status: Within Functional Limits for tasks assessed                                 General Comments: Improved cognition noted today  Exercises      General Comments   Nursing cleared pt for participation in physical therapy.  Pt agreeable to therapy session.      Pertinent Vitals/Pain Pain Assessment: Faces Faces Pain Scale: Hurts little more Pain Location: L thigh with activity Pain Descriptors / Indicators: Aching;Sore Pain Intervention(s): Limited activity within patient's tolerance;Monitored during session;Repositioned    Home Living                      Prior Function            PT Goals (current goals can now be found in the care plan section) Acute Rehab PT Goals Patient Stated Goal: "get out of here" PT Goal Formulation: With  patient Time For Goal Achievement: 07/22/19 Potential to Achieve Goals: Fair Progress towards PT goals: Progressing toward goals    Frequency    Min 2X/week      PT Plan Current plan remains appropriate    Co-evaluation PT/OT/SLP Co-Evaluation/Treatment: Yes Reason for Co-Treatment: Complexity of the patient's impairments (multi-system involvement);For patient/therapist safety;To address functional/ADL transfers PT goals addressed during session: Mobility/safety with mobility;Balance;Proper use of DME OT goals addressed during session: ADL's and self-care;Proper use of Adaptive equipment and DME      AM-PAC PT "6 Clicks" Mobility   Outcome Measure  Help needed turning from your back to your side while in a flat bed without using bedrails?: A Lot Help needed moving from lying on your back to sitting on the side of a flat bed without using bedrails?: A Lot Help needed moving to and from a bed to a chair (including a wheelchair)?: Total Help needed standing up from a chair using your arms (e.g., wheelchair or bedside chair)?: Total Help needed to walk in hospital room?: Total Help needed climbing 3-5 steps with a railing? : Total 6 Click Score: 8    End of Session Equipment Utilized During Treatment: Oxygen(4 L O2 via nasal cannula) Activity Tolerance: Patient limited by fatigue Patient left: in bed;with call bell/phone within reach;with bed alarm set;with family/visitor present;Other (comment)(B heels floating via pillow; OT present) Nurse Communication: Mobility status;Precautions PT Visit Diagnosis: Other abnormalities of gait and mobility (R26.89);Muscle weakness (generalized) (M62.81);Difficulty in walking, not elsewhere classified (R26.2)     Time: 1761-6073 PT Time Calculation (min) (ACUTE ONLY): 11 min  Charges:  $Therapeutic Activity: 8-22 mins                     Leitha Bleak, PT 07/10/19, 4:13 PM

## 2019-07-10 NOTE — TOC Progression Note (Signed)
Transition of Care [facility]) - Progression Note    Patient Details  Name: ANATOLE APOLLO MRN: [id number] Date of Birth: 1951/01/30  Transition of Care [facility]) CM/SW [place], LCSW Phone Number: 07/10/2019, 10:33 AM  Clinical Narrative:   CSW met with patient and patient's significant other at bedside to follow up on PT recommendations. Explained CSW role. Discussed PT recommendation for SNF, rolling walker and wheelchair. Patient reported he has a wheelchair, rolling walker, and rollator at home already. Patient and significant other do not want patient to go to a SNF for rehab. They reported patient went to one before and did not have a good experience. They would be agreeable to home health at discharge if recommended. CSW provided Medicare list of Home Health agencies. CSW will continue to follow.          Expected Discharge Plan and Services                                                 Social Determinants of Health (SDOH) Interventions    Readmission Risk Interventions No flowsheet data found.

## 2019-07-10 NOTE — Progress Notes (Signed)
Pharmacy Electrolyte Monitoring Consult:  Pharmacy consulted to assist in monitoring and replacing electrolytes in this 69 y.o. male admitted on 07/05/2019 with Proteus Bacteremia.   Labs:  Sodium (mmol/L)  Date Value  07/10/2019 138  08/28/2014 137   Potassium (mmol/L)  Date Value  07/10/2019 2.7 (LL)  08/28/2014 3.5   Magnesium (mg/dL)  Date Value  07/10/2019 2.0   Phosphorus (mg/dL)  Date Value  07/10/2019 2.8   Calcium (mg/dL)  Date Value  07/10/2019 8.2 (L)   Calcium, Total (mg/dL)  Date Value  08/28/2014 9.6   Albumin (g/dL)  Date Value  07/05/2019 3.5  08/28/2014 4.0    Assessment/Plan: Patient received furosemide 20mg  IV x 1, potassium chloride 23mEq PO x 1 and potassium phosphate 80mmol IV x 1 on 3/9.   Will order potassium 49mEq PO Q4hr x 3 doses.   Will replace for goal potassium ~ 4, goal magnesium ~ 2, and goal phosphorus ~ 2.5.   Will obtain electrolytes with am labs.   Pharmacy will continue to monitor and adjust per consult.   Isaiahs Chancy L 07/10/2019 11:00 AM

## 2019-07-10 NOTE — Progress Notes (Signed)
Occupational Therapy Treatment Patient Details Name: Shawn Lindsey MRN: [id number] DOB: 1951/01/19 Today's Date: 07/10/2019    History of present illness Pt is a 69 y.o. male presenting to hospital 07/05/19 with generalized weakness and malaise x2 days; also daily diarrheal episodes.  Pt admitted with severe acute hypoxic and hypercapnic respiratory failure from severe septic shock from UTI and pyelonephritis; AKI; and encephalopathy from sepsis.  Pt s/p 3/6 ureteral stent placement (on vent s/p; extubated 3/7).  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   OT comments  Pt seen for OT tx this date to f/u re: safety with ADLs/ADL mobility. Pt laying in bed with SO present at bedside. Pt reports feeling more "clear" this date and RN and SO confirm that pt seems more clear mentally. Pt agreeable to trying to get OOB this date. OT facilitates pt sup to sit at EOB with MIN/MOD A with HOB elevated and use of bed rails. In static sitting EOB, pt requires MIN A to complete 1 g/h task d/t some weakness of UEs against gravity and general fatigue in sitting. Pt requires 2 person MOD/MAX assist for sit to stand with RW with arm in arm and bilateral knees blocked with bed elevated. Pt tolerates ~20 seconds static standing before requiring seated RB both d/t fatigue and pt noted to desat somewhat with mobilization (86-87% on 4Lnc, resolves within ~30sec-72min seated rest break to >90%). PT and OT assist pt back to bed with MOD A +2 to manage trunk and LEs, pt requires MOD verbal cues to release R hand from bed rail for transition and still with difficulty completing this step in sequence of transition. Pt requires MAX A +2 for propulsion toward [facility] for repositioning. Pt left in bed, with call bell in reach, SO at bedside and RN notified of pt performance with  therapy this date. Anticipate SNF is still safest d/c recommendation at this time. Will continue to follow.   Follow Up Recommendations  SNF    Equipment Recommendations  Other (comment)(defer to next level of care)    Recommendations for Other Services      Precautions / Restrictions Precautions Precautions: Fall Precaution Comments: monitor BP, Monitor O2 (on 4Lnc this date), R IJ removed, desat to 86% with activity/mobilizing to/from sup<>sit sit<>stand Restrictions Weight Bearing Restrictions: No       Mobility Bed Mobility Overal bed mobility: Needs Assistance Bed Mobility: Supine to Sit;Sit to Supine     Supine to sit: Min assist;Mod assist;HOB elevated Sit to supine: Mod assist;+2 for physical assistance   General bed mobility comments: Pt uses bed rail and HOB elevated to come to sitting, increased assist for sit to sup as pt becomes fatigued with activity and is less able to participate in transition back to bed.  Transfers Overall transfer level: Needs assistance Equipment used: Rolling walker (2 wheeled) Transfers: Sit to/from Stand Sit to Stand: Mod assist;Max assist;+2 physical assistance;From elevated surface         General transfer comment: bilateral knee/foot blocking with arm in arm assistance/use of gait belt.    Balance Overall balance assessment: Needs assistance Sitting-balance support: Bilateral upper extremity supported;Feet supported Sitting balance-Leahy Scale: Fair Sitting balance - Comments: CGA for static EOB sitting     Standing balance-Leahy Scale: Poor Standing balance comment: requires b/l UE support and MOD  A +2 with RW for static standing balance/support.                           ADL either performed or assessed with clinical judgement   ADL Overall ADL's : Needs assistance/impaired     Grooming: Wash/dry face;Minimal assistance;Sitting Grooming Details (indicate cue type and reason): Pt with some UE weakness in  EOB sitting against gravity for fxl tasks, able to move through arc to wash face, but poor pressure application                                     Vision Patient Visual Report: No change from baseline     Perception     Praxis      Cognition Arousal/Alertness: Awake/alert Behavior During Therapy: WFL for tasks assessed/performed;Anxious(some anxiousness r/t attempting to stand, asks repeatedly, "what's next") Overall Cognitive Status: Within Functional Limits for tasks assessed                                 General Comments: pt with improved cognition this date, able to provide some more insight into PLOF and pt's significant other, who is present throughout, corroborates that information is correct.        Exercises Other Exercises Other Exercises: OT facilitaes education with pt and significant other re: importance of OOB activity including prevention of skin breakdown and further infection. Pt and SO verbalize good understanding.   Shoulder Instructions       General Comments      Pertinent Vitals/ Pain       Pain Assessment: Faces Faces Pain Scale: Hurts little more Pain Location: L thigh after EOB sitting before/after sit<>stand Pain Descriptors / Indicators: Aching;Sore Pain Intervention(s): Limited activity within patient's tolerance;Monitored during session;Repositioned  Home Living                                          Prior Functioning/Environment              Frequency  Min 2X/week        Progress Toward Goals  OT Goals(current goals can now be found in the care plan section)  Progress towards OT goals: Progressing toward goals  Acute Rehab OT Goals Patient Stated Goal: "get out of here" OT Goal Formulation: With patient Time For Goal Achievement: 07/23/19 Potential to Achieve Goals: Good  Plan Discharge plan remains appropriate;Frequency needs to be updated    Co-evaluation     PT/OT/SLP Co-Evaluation/Treatment: Yes Reason for Co-Treatment: Complexity of the patient's impairments (multi-system involvement);For patient/therapist safety;To address functional/ADL transfers PT goals addressed during session: Mobility/safety with mobility;Balance;Proper use of DME OT goals addressed during session: ADL's and self-care;Proper use of Adaptive equipment and DME      AM-PAC OT "6 Clicks" Daily Activity     Outcome Measure   Help from another person eating meals?: A Little Help from another person taking care of personal grooming?: A Little Help from another person toileting, which includes using toliet, bedpan, or urinal?: A Lot Help from another person bathing (including washing, rinsing, drying)?: A Lot Help from another person to put on and taking off regular upper body clothing?: A Lot Help from another  person to put on and taking off regular lower body clothing?: A Lot 6 Click Score: 14    End of Session Equipment Utilized During Treatment: Gait belt;Rolling walker  OT Visit Diagnosis: History of falling (Z91.81);Muscle weakness (generalized) (M62.81)   Activity Tolerance Patient tolerated treatment well;Patient limited by pain   Patient Left in bed;with call bell/phone within reach;with family/visitor present   Nurse Communication Mobility status        Time: 4098-1191 OT Time Calculation (min): 38 min  Charges: OT General Charges $OT Visit: 1 Visit OT Treatments $Self Care/Home Management : 8-22 mins $Therapeutic Activity: 8-22 mins  Gerrianne Scale, MS, OTR/L ascom [phone] 07/10/19, 12:00 PM

## 2019-07-11 ENCOUNTER — Inpatient Hospital Stay: Payer: Self-pay

## 2019-07-11 DIAGNOSIS — N170 Acute kidney failure with tubular necrosis: Secondary | ICD-10-CM

## 2019-07-11 DIAGNOSIS — J150 Pneumonia due to Klebsiella pneumoniae: Secondary | ICD-10-CM

## 2019-07-11 DIAGNOSIS — A498 Other bacterial infections of unspecified site: Secondary | ICD-10-CM

## 2019-07-11 LAB — HEMOGLOBIN A1C
Hgb A1c MFr Bld: 5.1 % (ref 4.8–5.6)
Mean Plasma Glucose: 99.67 mg/dL

## 2019-07-11 LAB — CBC
HCT: 43.2 % (ref 39.0–52.0)
Hemoglobin: 14.7 g/dL (ref 13.0–17.0)
MCH: 30.2 pg (ref 26.0–34.0)
MCHC: 34 g/dL (ref 30.0–36.0)
MCV: 88.7 fL (ref 80.0–100.0)
Platelets: 128 10*3/uL — ABNORMAL LOW (ref 150–400)
RBC: 4.87 MIL/uL (ref 4.22–5.81)
RDW: 13.4 % (ref 11.5–15.5)
WBC: 15 10*3/uL — ABNORMAL HIGH (ref 4.0–10.5)
nRBC: 0 % (ref 0.0–0.2)

## 2019-07-11 LAB — SARS CORONAVIRUS 2 (TAT 6-24 HRS): SARS Coronavirus 2: NEGATIVE

## 2019-07-11 LAB — GLUCOSE, CAPILLARY
Glucose-Capillary: 80 mg/dL (ref 70–99)
Glucose-Capillary: 109 mg/dL — ABNORMAL HIGH (ref 70–99)
Glucose-Capillary: 97 mg/dL (ref 70–99)

## 2019-07-11 LAB — BASIC METABOLIC PANEL
Anion gap: 7 (ref 5–15)
BUN: 19 mg/dL (ref 8–23)
CO2: 30 mmol/L (ref 22–32)
Calcium: 8.6 mg/dL — ABNORMAL LOW (ref 8.9–10.3)
Chloride: 102 mmol/L (ref 98–111)
Creatinine, Ser: 1.02 mg/dL (ref 0.61–1.24)
GFR calc Af Amer: >60 mL/min (ref >60)
GFR calc non Af Amer: >60 mL/min (ref >60)
Glucose, Bld: 102 mg/dL — ABNORMAL HIGH (ref 70–99)
Potassium: 3.6 mmol/L (ref 3.5–5.1)
Sodium: 139 mmol/L (ref 135–145)

## 2019-07-11 LAB — PHOSPHORUS: Phosphorus: 3 mg/dL (ref 2.5–4.6)

## 2019-07-11 LAB — MAGNESIUM: Magnesium: 2 mg/dL (ref 1.7–2.4)

## 2019-07-11 MED ORDER — SODIUM CHLORIDE 0.9% FLUSH: 10.0000 mL | INTRAVENOUS | Status: DC | PRN

## 2019-07-11 MED ORDER — POTASSIUM CHLORIDE CRYS ER 20 MEQ PO TBCR
40.0000 meq | EXTENDED_RELEASE_TABLET | Freq: Once | ORAL | Status: AC
  Administered 2019-07-11: 40 meq via ORAL
  Filled 2019-07-11: qty 2

## 2019-07-11 MED ORDER — ASPIRIN EC 81 MG PO TBEC
81.0000 mg | DELAYED_RELEASE_TABLET | Freq: Every day | ORAL | Status: DC
  Administered 2019-07-11 – 2019-07-12 (×2): 81 mg via ORAL
  Filled 2019-07-11 (×2): qty 1

## 2019-07-11 MED ORDER — POLYETHYLENE GLYCOL 3350 17 G PO PACK
17.0000 g | PACK | Freq: Every day | ORAL | Status: DC
  Administered 2019-07-11 – 2019-07-12 (×2): 17 g via ORAL
  Filled 2019-07-11 (×2): qty 1

## 2019-07-11 NOTE — NC FL2 (Signed)
[place] LEVEL OF CARE SCREENING TOOL     IDENTIFICATION  Patient Name: Shawn Lindsey Birthdate: 03/05/1951 Sex: male Admission Date (Current Location): 07/05/2019  [place] and [place] Number:  Engineering geologist and Address:  [facility], [address] [place], [place] [address]      Provider Number: [id number]  Attending Physician Name and Address:  Fritzi Mandes, MD  Relative Name and Phone Number:  Karren Cobble - significant other [phone]    Current Level of Care: Hospital Recommended Level of Care: [place] Prior Approval Number:    Date Approved/Denied:   PASRR Number: [id number]  Discharge Plan: SNF    Current Diagnoses: Patient Active Problem List   Diagnosis Date Noted  . Acute respiratory failure with hypoxia ([place])   . Septic shock ([place]) 07/05/2019  . Mass of upper lobe of right lung 09/29/2015  . Cancer of tonsil ([place]) 08/05/2014  . HTN (hypertension) 08/05/2014  . COPD (chronic obstructive pulmonary disease) ([place]) 08/05/2014  . Hyperlipidemia 08/05/2014    Orientation RESPIRATION BLADDER Height & Weight     Self, Time, Situation, Place  O2(2L [place]) Indwelling catheter Weight: 121.2 kg Height:  5\' 10"  (177.8 cm)  BEHAVIORAL SYMPTOMS/MOOD NEUROLOGICAL BOWEL NUTRITION STATUS      Continent Diet(Regular)  AMBULATORY STATUS COMMUNICATION OF NEEDS Skin   Extensive Assist Verbally Normal                       Personal Care Assistance Level of Assistance  Bathing, Feeding, Dressing Bathing Assistance: Maximum assistance Feeding assistance: Independent Dressing Assistance: Maximum assistance     Functional Limitations Info             SPECIAL CARE FACTORS FREQUENCY  PT (By licensed PT), OT (By licensed OT)     PT Frequency: 5 times per week OT Frequency: 5 times per week            Contractures Contractures Info: Not present    Additional Factors Info  Code Status,  Allergies Code Status Info: Full Code Allergies Info: No known allergies           Current Medications (07/11/2019):  This is the current hospital active medication list Current Facility-Administered Medications  Medication Dose Route Frequency Provider Last Rate Last Admin  . 0.9 %  sodium chloride infusion  250 mL Intravenous Continuous Stoioff, Scott C, MD   Stopped at 07/05/19 1828  . acetaminophen (TYLENOL) tablet 650 mg  650 mg Oral Q4H PRN Stoioff, Scott C, MD      . atorvastatin (LIPITOR) tablet 80 mg  80 mg Oral q1800 Flora Lipps, MD   80 mg at 07/10/19 1706  . budesonide (PULMICORT) nebulizer solution 0.5 mg  0.5 mg Nebulization BID Flora Lipps, MD   0.5 mg at 07/11/19 0747  . chlorhexidine (PERIDEX) 0.12 % solution 15 mL  15 mL Mouth Rinse BID Stoioff, Scott C, MD   15 mL at 07/10/19 2114  . Chlorhexidine Gluconate Cloth 2 % PADS 6 each  6 each Topical Daily Bradly Bienenstock, NP   6 each at 07/08/19 [phone]  . enoxaparin (LOVENOX) injection 40 mg  40 mg Subcutaneous QHS Flora Lipps, MD   40 mg at 07/10/19 2114  . famotidine (PEPCID) tablet 20 mg  20 mg Oral QHS Flora Lipps, MD   20 mg at 07/10/19 2114  . FLUoxetine (PROZAC) capsule 10 mg  10 mg Oral Daily Kasa, Kurian,  MD   10 mg at 07/10/19 0931  . gabapentin (NEURONTIN) capsule 600 mg  600 mg Oral QHS Flora Lipps, MD   600 mg at 07/10/19 2114  . ipratropium-albuterol (DUONEB) 0.5-2.5 (3) MG/3ML nebulizer solution 3 mL  3 mL Nebulization TID Flora Lipps, MD   3 mL at 07/11/19 0747  . levETIRAcetam (KEPPRA) tablet 500 mg  500 mg Oral BID Flora Lipps, MD   500 mg at 07/10/19 2115  . levothyroxine (SYNTHROID) tablet 125 mcg  125 mcg Oral Q0600 Flora Lipps, MD   125 mcg at 07/11/19 0514  . nicotine (NICODERM CQ - dosed in mg/24 hours) patch 21 mg  21 mg Transdermal Daily Flora Lipps, MD   21 mg at 07/10/19 0929  . ondansetron (ZOFRAN) injection 4 mg  4 mg Intravenous Q6H PRN Stoioff, Scott C, MD      . oxyCODONE-acetaminophen  (PERCOCET/ROXICET) 5-325 MG per tablet 1 tablet  1 tablet Oral Q6H PRN Flora Lipps, MD   1 tablet at 07/11/19 0514  . piperacillin-tazobactam (ZOSYN) IVPB 3.375 g  3.375 g Intravenous Q8H Kasa, Maretta Bees, MD 12.5 mL/hr at 07/11/19 0709 3.375 g at 07/11/19 0709  . polyethylene glycol (MIRALAX / GLYCOLAX) packet 17 g  17 g Oral Daily Fritzi Mandes, MD      . potassium chloride SA (KLOR-CON) CR tablet 40 mEq  40 mEq Oral Once Lu Duffel, [facility]         Discharge Medications: Please see discharge summary for a list of discharge medications.  Relevant Imaging Results:  Relevant Lab Results:   Additional Information SS# [id number]  Shelbie Hutching, RN

## 2019-07-11 NOTE — Progress Notes (Signed)
[facility] [place] NAME: Shawn Lindsey    MR#:  [id number]  DATE OF BIRTH:  20-Dec-1950  SUBJECTIVE:   Patient moved out of ICU. He was admitted with severe septic shock requiring IV boys oppressor and being on ventilator secondary to Proteus bacteremia and pyelonephritis/UTI secondary to ureteral stone status post stent placement on 3/6 by urology  Patient feels very weak. Worked with physical therapy recommends rehab. No Fever REVIEW OF SYSTEMS:   Review of Systems  Constitutional: Positive for malaise/fatigue. Negative for chills, fever and weight loss.  HENT: Negative for ear discharge, ear pain and nosebleeds.   Eyes: Negative for blurred vision, pain and discharge.  Respiratory: Negative for sputum production, shortness of breath, wheezing and stridor.   Cardiovascular: Negative for chest pain, palpitations, orthopnea and PND.  Gastrointestinal: Negative for abdominal pain, diarrhea, nausea and vomiting.  Genitourinary: Negative for frequency and urgency.  Musculoskeletal: Negative for back pain and joint pain.  Neurological: Positive for weakness. Negative for sensory change, speech change and focal weakness.  Psychiatric/Behavioral: Negative for depression and hallucinations. The patient is not nervous/anxious.    Tolerating Diet: Tolerating PT:   DRUG ALLERGIES:  No Known Allergies  VITALS:  Blood pressure (!) 117/59, pulse 85, temperature 99 F (37.2 C), resp. rate 18, height 5\' 10"  (1.778 m), weight 121.2 kg, SpO2 94 %.  PHYSICAL EXAMINATION:   Physical Exam  GENERAL:  69 y.o.-year-old patient lying in the bed with no acute distress.  EYES: Pupils equal, round, reactive to light and accommodation. No scleral icterus.   HEENT: Head atraumatic, normocephalic. Oropharynx and nasopharynx clear.  NECK:  Supple, no jugular venous distention. No thyroid enlargement, no tenderness.  LUNGS: Normal breath sounds bilaterally, no  wheezing, rales, rhonchi. No use of accessory muscles of respiration.  CARDIOVASCULAR: S1, S2 normal. No murmurs, rubs, or gallops.  ABDOMEN: Soft, nontender, nondistended. Bowel sounds present. No organomegaly or mass. Foley+ EXTREMITIES: No cyanosis, clubbing or edema b/l.    NEUROLOGIC: Cranial nerves II through XII are intact. No focal Motor or sensory deficits b/l.  Severe weakness PSYCHIATRIC:  patient is alert and oriented x 3.  SKIN: No obvious rash, lesion, or ulcer.   LABORATORY PANEL:  CBC Recent Labs  Lab 07/11/19 0526  WBC 15.0*  HGB 14.7  HCT 43.2  PLT 128*    Chemistries  Recent Labs  Lab 07/05/19 1452 07/06/19 0323 07/11/19 0526  NA 136   < > 139  K 4.5   < > 3.6  CL 101   < > 102  CO2 21*   < > 30  GLUCOSE 69*   < > 102*  BUN 33*   < > 19  CREATININE 2.91*   < > 1.02  CALCIUM 9.0   < > 8.6*  MG  --    < > 2.0  AST 30  --   --   ALT 13  --   --   ALKPHOS 93  --   --   BILITOT 2.2*  --   --    < > = values in this interval not displayed.   Cardiac Enzymes No results for input(s): TROPONINI in the last 168 hours. RADIOLOGY:  No results found. ASSESSMENT AND PLAN:  Shawn Lindsey is a 69 y.o. male  past medical history of consular cancer status post chemo, COPD on home oxygen at night, Shawn Lindsey, hypertension, seizure disorder, stroke comes to the emergency room  forevaluation of generalized weakness malaise is progressively worse over the past 2 days.  States his been having daily diarrheal episodes.  Severe septic shock secondary to UTI/pyelonephritis with ureterolithiasis and nephrolithiasis, with acute renal failure, acute hypoxic respiratory failure secondary to acute on chronic COPD exacerbation requiring mechanical intubation -BC Proteus-- resistant to multiple antibiotics -patient currently on IV Zosyn (per pharmacist-- day 4 of zosyn) -ID consultation with Dr. Ola Spurr to determine course of antibiotic. -Repeat blood culture today -blood pressure  stable. Patient did require IV pressers in the ICU  Bilateral nephrolithiasis with staghorn calculus right kidney and right ureteral stone status post emergent stent placement by Dr. Bernardo Heater given severe sepsis due to UTI -urine culture growing Proteus-- resistant to multiple antibiotics -continue IV Zosyn -patient making good urine. -Okay to discontinue Foley catheter today. Discussed with Dr. Bernardo Heater  Acute renal failure/ATN in the setting of severe sepsis -patient came in with creatinine of 2.91--- 0.96 -DC Foley catheter  Hypokalemia repleted  Acute on chronic hypoxic respiratory failure secondary to COPD exacerbation with Klebsiella pneumonia -chest x-ray shows right upper lobe opacity-- chronic -required mechanical ventilation for couple days. Extubated successfully -patient has had extensive workup in the past with bronchoscopy and biopsy from right upper lobe which was negative for malignancy. -tracheal  aspirate positive for Klebsiella -continue IV Zosyn -incentive spirometer -breathing treatment as needed  History of chronic seizure disorder -resume Keppra  Hypertension continue amlodipine  Hyperlipidemia continue statins  type II diabetes, well-controlled with peripheral neuropathy  -pt not on any medication -continue CBG TID -A1c pending -continue gabapentin  Hypothyroidism continue Synthroid  history of stroke with no peripheral vision on the left with some numbness in the left arm -- continue Lipitor -start baby aspirin for secondary prevention  Tobacco abuse -patient was advised on smoking cessation  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 Patient underwent bronchoscopy with biopsy in 2017 that was negative for malignancy. Patient currently remains asymptomatic and according to ICU attending does not have interest in pursuing  another biopsy.   Procedures: right ureteral stent placement on March 6 Family communication : Consults : ICU, ID, urology Discharge Disposition : most likely to rehab. TOC working on rehab placement. ID consultation placed today for help with antibiotic at discharge CODE STATUS: full DVT Prophylaxis : Lovenox Barriers to discharge: none  TOTAL TIME TAKING CARE OF THIS PATIENT: *30* minutes.  >50% time spent on counselling and coordination of care  Note: This dictation was prepared with Dragon dictation along with smaller phrase technology. Any transcriptional errors that result from this process are unintentional.  Fritzi Mandes M.D    [facility]   CC: Primary care physician; Laurell Josephs, MDPatient ID: Shawn Lindsey, male   DOB: 1951-01-26, 69 y.o.   MRN: [id number]

## 2019-07-11 NOTE — Progress Notes (Signed)
Infectious Disease Long Term IV Antibiotic Orders MINOR IDEN 1951/04/27  Diagnosis: Proteus urosepsis, pyelo and   Culture results Proteus   LABS Lab Results  Component Value Date   CREATININE 1.02 07/11/2019   Lab Results  Component Value Date   WBC 15.0 (H) 07/11/2019   HGB 14.7 07/11/2019   HCT 43.2 07/11/2019   MCV 88.7 07/11/2019   PLT 128 (L) 07/11/2019   No results found for: ESRSEDRATE, POCTSEDRATE No results found for: CRP  Allergies: No Known Allergies  Discharge antibiotics Zosyn  3.375 grams every 8 hours  PICC Care per protocol Labs weekly while on IV antibiotics -FAX weekly labs to [phone] CBC w diff   Comprehensive met panel  Planned duration of antibiotics - 4  Weeks tentative. If stones removed sooner can be shortened.  Stop date April 5th  Follow up clinic date -April 5  Leonel Ramsay, MD

## 2019-07-11 NOTE — Care Management Important Message (Signed)
Important Message  Patient Details  Name: Shawn Lindsey MRN: [id number] Date of Birth: 1950/05/22   Medicare Important Message Given:  Yes     [name] [name] 07/11/2019, 11:21 AM

## 2019-07-11 NOTE — Progress Notes (Signed)
Peripherally Inserted Central Catheter Placement  The IV Nurse has discussed with the patient and/or persons authorized to consent for the patient, the purpose of this procedure and the potential benefits and risks involved with this procedure.  The benefits include less needle sticks, lab draws from the catheter, and the patient may be discharged home with the catheter. Risks include, but not limited to, infection, bleeding, blood clot (thrombus formation), and puncture of an artery; nerve damage and irregular heartbeat and possibility to perform a PICC exchange if needed/ordered by physician.  Alternatives to this procedure were also discussed.  Bard Power PICC patient education guide, fact sheet on infection prevention and patient information card has been provided to patient /or left at bedside.    PICC/Midline Placement Documentation  PICC Single Lumen 92/76/39 PICC Right Basilic 40 cm 0 cm (Active)  Indication for Insertion or Continuance of Line Prolonged intravenous therapies;Home intravenous therapies (PICC only) 07/11/19 1730  Exposed Catheter (cm) 0 cm 07/11/19 1730  Site Assessment Clean;Dry;Intact 07/11/19 1730  Line Status Flushed;Saline locked;Blood return noted 07/11/19 1730  Dressing Type Transparent;Securing device 07/11/19 1730  Dressing Status Clean;Dry;Intact;Antimicrobial disc in place 07/11/19 1730  Dressing Intervention New dressing 07/11/19 1730  Dressing Change Due 07/18/19 07/11/19 1730       Enos Fling 07/11/2019, 5:54 PM

## 2019-07-11 NOTE — TOC Progression Note (Signed)
Transition of Care [facility]) - Progression Note    Patient Details  Name: [name] MRN: [id number] Date of Birth: [birth year]  Transition [facility] Care [facility]) CM/SW Contact  Shelbie Hutching, RN Phone Number: 07/11/2019, 9:47 AM  Clinical Narrative:    [place] insurance authorization started- Reference number [id number].  Expected Discharge Plan: [place] Barriers to Discharge: Continued Medical Work up  Expected Discharge Plan and Services Expected Discharge Plan: [place]   Discharge Planning Services: CM Consult Post Acute Care Choice: [place] Living arrangements for the past 2 months: Single Family Home                                       Social Determinants of Health (SDOH) Interventions    Readmission Risk Interventions No flowsheet data found.

## 2019-07-11 NOTE — Progress Notes (Signed)
PT recommend SNF. Pt states he does not want SNF placement but prefers home health. Will notify oncoming shift RN and request that RN discuss with TOC team.

## 2019-07-11 NOTE — TOC Progression Note (Signed)
Transition of Care [facility]) - Progression Note    Patient Details  Name: NOEH SPARACINO MRN: [id number] Date of Birth: Jul 17, 1950  Transition of Care [facility]) CM/SW Contact  Shelbie Hutching, RN Phone Number: 07/11/2019, 3:15 PM  Clinical Narrative:    Patient chooses [facility] for short term rehab, [facility] has approved authorization: Reference number is [id number], start date 07/12/19 next review 07/16/19, Care Coordinator is [facility].   Expected Discharge Plan: [place] Barriers to Discharge: Continued Medical Work up  Expected Discharge Plan and Services Expected Discharge Plan: [place]   Discharge Planning Services: CM Consult Post Acute Care Choice: [place] Living arrangements for the past 2 months: Single Family Home                                       Social Determinants of Health (SDOH) Interventions    Readmission Risk Interventions No flowsheet data found.

## 2019-07-11 NOTE — Consult Note (Signed)
Infectious Disease     Reason for Consult:MDR proteus bacteremia and pyel   Referring Physician: Dr Posey Pronto Date of Admission:  07/05/2019   Active Problems:   Septic shock ([place])   Acute respiratory failure with hypoxia (HCC)   Proteus mirabilis infection   Pneumonia of right upper lobe due to Klebsiella pneumoniae ([place])   Acute renal failure with tubular necrosis (HCC)   HPI: Shawn Lindsey is a 69 y.o. male admitted 3/5 with several days of diarrhea, weakness and found to have sepsis. Intubated and admitted to ICU.  Found to have bil nephrolithiasis with midl R hydro.  He underwent urological surgery on March 5 with cystoscopy and right urethral stent placement.  He has improved and has been discharged from the intensive care unit to the floor.  Cultures ended up growing multidrug-resistant Proteus.  Trach culture grew a sensitive Klebsiella. He has been afebrile and white count is down to 15.  He has been on Zosyn since 3/7.  Of note he had mdR proteus uti last year and was admitted at  [facility] and treated with IV abx Currently he feels well but is weak. No fevers, nv.  Past Medical History:  Diagnosis Date  . Anxiety    mild  . 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 COPD (chronic obstructive pulmonary disease) ([place])   . Diabetes mellitus without complication ([place])   . Elevated cholesterol   . GERD (gastroesophageal reflux disease)   . Hypertension   . Hypothyroidism   . Seizures ([place]) 2015   related to sepsis  . Stroke [facility]) 2007   no peripheral vision on left side of both eyes & left arm gets numb under stress   Past Surgical History:  Procedure Laterality Date  . CYSTOSCOPY WITH STENT PLACEMENT Right 07/05/2019   Procedure: CYSTOSCOPY WITH STENT PLACEMENT;  Surgeon: Abbie Sons, MD;  Location: [facility] ORS;  Service: Urology;  Laterality: Right;  . ELECTROMAGNETIC NAVIGATION BROCHOSCOPY N/A 03/09/2016   Procedure: ELECTROMAGNETIC NAVIGATION BRONCHOSCOPY;  Surgeon: Flora Lipps, MD;  Location:  [facility] ORS;  Service: Cardiopulmonary;  Laterality: N/A;  . TONSILLECTOMY    . TRACHEOSTOMY     Social History   Tobacco Use  . Smoking status: Current Every Day Smoker    Packs/day: 1.00    Years: 40.00    Pack years: 40.00  . Smokeless tobacco: Never Used  Substance Use Topics  . Alcohol use: No  . Drug use: No   Family History  Problem Relation Age of Onset  . Diabetes Brother   . Dementia Mother     Allergies: No Known Allergies  Current antibiotics: Antibiotics Given (last 72 hours)    Date/Time Action Medication Dose Rate   07/08/19 1455 New Bag/Given   piperacillin-tazobactam (ZOSYN) IVPB 3.375 g 3.375 g 12.5 mL/hr   07/08/19 2238 New Bag/Given   piperacillin-tazobactam (ZOSYN) IVPB 3.375 g 3.375 g 12.5 mL/hr   07/09/19 0554 New Bag/Given   piperacillin-tazobactam (ZOSYN) IVPB 3.375 g 3.375 g 12.5 mL/hr   07/09/19 1309 New Bag/Given   piperacillin-tazobactam (ZOSYN) IVPB 3.375 g 3.375 g 12.5 mL/hr   07/09/19 2202 New Bag/Given   piperacillin-tazobactam (ZOSYN) IVPB 3.375 g 3.375 g 12.5 mL/hr   07/10/19 0512 New Bag/Given   piperacillin-tazobactam (ZOSYN) IVPB 3.375 g 3.375 g 12.5 mL/hr   07/10/19 1305 New Bag/Given   piperacillin-tazobactam (ZOSYN) IVPB 3.375 g 3.375 g 12.5 mL/hr   07/10/19 2114 New Bag/Given   piperacillin-tazobactam (ZOSYN) IVPB 3.375 g 3.375  g 12.5 mL/hr   07/11/19 8638 New Bag/Given  [pyxis inventory out. Missing dose message sent]   piperacillin-tazobactam (ZOSYN) IVPB 3.375 g 3.375 g 12.5 mL/hr      MEDICATIONS: . aspirin EC  81 mg Oral Daily  . atorvastatin  80 mg Oral q1800  . budesonide (PULMICORT) nebulizer solution  0.5 mg Nebulization BID  . chlorhexidine  15 mL Mouth Rinse BID  . Chlorhexidine Gluconate Cloth  6 each Topical Daily  . enoxaparin (LOVENOX) injection  40 mg Subcutaneous QHS  . famotidine  20 mg Oral QHS  . FLUoxetine  10 mg Oral Daily  . gabapentin  600 mg Oral QHS  . ipratropium-albuterol  3 mL Nebulization  TID  . levETIRAcetam  500 mg Oral BID  . levothyroxine  125 mcg Oral Q0600  . nicotine  21 mg Transdermal Daily  . polyethylene glycol  17 g Oral Daily    Review of Systems - 11 systems reviewed and negative per HPI   OBJECTIVE: Temp:  [98 F (36.7 C)-99 F (37.2 C)] 99 F (37.2 C) (03/10 2340) Pulse Rate:  [55-93] 85 (03/10 2340) Resp:  [13-19] 18 (03/10 2338) BP: (116-132)/(59-93) 117/59 (03/10 2338) SpO2:  [89 %-96 %] 94 % (03/10 2340) Weight:  [121 kg-121.2 kg] 121.2 kg (03/11 0500) Physical Exam  Constitutional: He is oriented to person, place, and time. He appears well-developed and well-nourished. No distress. obese HENT: anicteric Mouth/Throat: Oropharynx is clear and moist. No oropharyngeal exudate.  Cardiovascular: Normal rate, regular rhythm and normal heart sounds. Exam reveals no gallop and no friction rub.  No murmur heard.  Pulmonary/Chest: Effort normal and breath sounds normal. No respiratory distress. He has no wheezes.  Abdominal: Soft. Bowel sounds are normal. He exhibits no distension. There is no tenderness.  Lymphadenopathy:  He has no cervical adenopathy.  Neurological: He is alert and oriented to person, place, and time.  Skin: Skin is warm and dry. No rash noted. No erythema.  Psychiatric: He has a normal mood and affect. His behavior is normal.    LABS: Results for orders placed or performed during the hospital encounter of 07/05/19 (from the past 48 hour(s))  Glucose, capillary     Status: Abnormal   Collection Time: 07/09/19  4:06 PM  Result Value Ref Range   Glucose-Capillary 68 (L) 70 - 99 mg/dL    Comment: Glucose reference range applies only to samples taken after fasting for at least 8 hours.  Glucose, capillary     Status: Abnormal   Collection Time: 07/09/19  4:52 PM  Result Value Ref Range   Glucose-Capillary 120 (H) 70 - 99 mg/dL    Comment: Glucose reference range applies only to samples taken after fasting for at least 8 hours.   Glucose, capillary     Status: Abnormal   Collection Time: 07/09/19  7:55 PM  Result Value Ref Range   Glucose-Capillary 106 (H) 70 - 99 mg/dL    Comment: Glucose reference range applies only to samples taken after fasting for at least 8 hours.   Comment 1 Document in Chart   Glucose, capillary     Status: Abnormal   Collection Time: 07/10/19  1:09 AM  Result Value Ref Range   Glucose-Capillary 107 (H) 70 - 99 mg/dL    Comment: Glucose reference range applies only to samples taken after fasting for at least 8 hours.  Glucose, capillary     Status: Abnormal   Collection Time: 07/10/19  3:20 AM  Result Value Ref Range   Glucose-Capillary 100 (H) 70 - 99 mg/dL    Comment: Glucose reference range applies only to samples taken after fasting for at least 8 hours.  Basic metabolic panel     Status: Abnormal   Collection Time: 07/10/19  3:48 AM  Result Value Ref Range   Sodium 138 135 - 145 mmol/L   Potassium 2.7 (LL) 3.5 - 5.1 mmol/L    Comment: CRITICAL RESULT CALLED TO, READ BACK BY AND VERIFIED WITH CYNTHIA VAZQUEZ ON 07/10/19 AT 0440 [facility]    Chloride 97 (L) 98 - 111 mmol/L   CO2 30 22 - 32 mmol/L   Glucose, Bld 99 70 - 99 mg/dL    Comment: Glucose reference range applies only to samples taken after fasting for at least 8 hours.   BUN 16 8 - 23 mg/dL   Creatinine, Ser 0.96 0.61 - 1.24 mg/dL   Calcium 8.2 (L) 8.9 - 10.3 mg/dL   GFR calc non Af Amer >60 >60 mL/min   GFR calc Af Amer >60 >60 mL/min   Anion gap 11 5 - 15    Comment: Performed at [facility], [address] [place], [place] [address]  Magnesium     Status: None   Collection Time: 07/10/19  3:48 AM  Result Value Ref Range   Magnesium 2.0 1.7 - 2.4 mg/dL    Comment: Performed at [facility], [address] [place], [place] [address]  Phosphorus     Status: None   Collection Time: 07/10/19  3:48 AM  Result Value Ref Range   Phosphorus 2.8 2.5 - 4.6 mg/dL    Comment: Performed at [facility], [address] [place], [place] [address]  CBC     Status: Abnormal   Collection Time: 07/10/19  3:48 AM  Result Value Ref Range   WBC 12.8 (H) 4.0 - 10.5 K/uL   RBC 5.01 4.22 - 5.81 MIL/uL   Hemoglobin 14.9 13.0 - 17.0 g/dL   HCT 44.1 39.0 - 52.0 %   MCV 88.0 80.0 - 100.0 fL   MCH 29.7 26.0 - 34.0 pg   MCHC 33.8 30.0 - 36.0 g/dL   RDW 13.4 11.5 - 15.5 %   Platelets 93 (L) 150 - 400 K/uL    Comment: Immature Platelet Fraction may be clinically indicated, consider ordering this additional test [id number] CONSISTENT WITH PREVIOUS RESULT    nRBC 0.0 0.0 - 0.2 %    Comment: Performed at [facility], [address] [place], [place] [address]  Glucose, capillary     Status: None   Collection Time: 07/10/19  7:17 AM  Result Value Ref Range   Glucose-Capillary 99 70 - 99 mg/dL    Comment: Glucose reference range applies only to samples taken after fasting for at least 8 hours.  Glucose, capillary     Status: None   Collection Time: 07/10/19 11:33 AM  Result Value Ref Range   Glucose-Capillary 96 70 - 99 mg/dL    Comment: Glucose reference range applies only to samples taken after fasting for at least 8 hours.  Glucose, capillary     Status: Abnormal   Collection Time: 07/10/19  3:33 PM  Result Value Ref Range   Glucose-Capillary 106 (H) 70 - 99 mg/dL    Comment: Glucose reference range applies only to samples taken after fasting for at least 8 hours.  Potassium     Status: None   Collection Time: 07/10/19  6:06 PM  Result Value Ref Range   Potassium 3.6 3.5 - 5.1 mmol/L    Comment: Performed at [facility], [address] [place], [place] [address]  Glucose, capillary     Status: Abnormal   Collection Time: 07/10/19  7:22 PM  Result Value Ref Range   Glucose-Capillary 110 (H) 70 - 99 mg/dL    Comment: Glucose reference range applies only to samples taken after fasting for at least 8 hours.  Glucose, capillary     Status: Abnormal    Collection Time: 07/10/19 10:12 PM  Result Value Ref Range   Glucose-Capillary 105 (H) 70 - 99 mg/dL    Comment: Glucose reference range applies only to samples taken after fasting for at least 8 hours.  Basic metabolic panel     Status: Abnormal   Collection Time: 07/11/19  5:26 AM  Result Value Ref Range   Sodium 139 135 - 145 mmol/L   Potassium 3.6 3.5 - 5.1 mmol/L   Chloride 102 98 - 111 mmol/L   CO2 30 22 - 32 mmol/L   Glucose, Bld 102 (H) 70 - 99 mg/dL    Comment: Glucose reference range applies only to samples taken after fasting for at least 8 hours.   BUN 19 8 - 23 mg/dL   Creatinine, Ser 1.02 0.61 - 1.24 mg/dL   Calcium 8.6 (L) 8.9 - 10.3 mg/dL   GFR calc non Af Amer >60 >60 mL/min   GFR calc Af Amer >60 >60 mL/min   Anion gap 7 5 - 15    Comment: Performed at [facility], [address] [place], [place] [address]  Magnesium     Status: None   Collection Time: 07/11/19  5:26 AM  Result Value Ref Range   Magnesium 2.0 1.7 - 2.4 mg/dL    Comment: Performed at [facility], [address] [place], [place] [address]  Phosphorus     Status: None   Collection Time: 07/11/19  5:26 AM  Result Value Ref Range   Phosphorus 3.0 2.5 - 4.6 mg/dL    Comment: Performed at [facility], [address] [place], [place] [address]  CBC     Status: Abnormal   Collection Time: 07/11/19  5:26 AM  Result Value Ref Range   WBC 15.0 (H) 4.0 - 10.5 K/uL   RBC 4.87 4.22 - 5.81 MIL/uL   Hemoglobin 14.7 13.0 - 17.0 g/dL   HCT 43.2 39.0 - 52.0 %   MCV 88.7 80.0 - 100.0 fL   MCH 30.2 26.0 - 34.0 pg   MCHC 34.0 30.0 - 36.0 g/dL   RDW 13.4 11.5 - 15.5 %   Platelets 128 (L) 150 - 400 K/uL   nRBC 0.0 0.0 - 0.2 %    Comment: Performed at [facility], [address] [place], [place] [address]  Glucose, capillary     Status: None   Collection Time: 07/11/19  7:33 AM  Result Value Ref Range   Glucose-Capillary 80 70 - 99 mg/dL    Comment: Glucose  reference range applies only to samples taken after fasting for at least 8 hours.  Hemoglobin A1c     Status: None   Collection Time: 07/11/19  8:56 AM  Result Value Ref Range   Hgb A1c MFr Bld 5.1 4.8 - 5.6 %    Comment: (NOTE) Pre diabetes:          5.7%-6.4% Diabetes:              >  6.4% Glycemic control for   <7.0% adults with diabetes    Mean Plasma Glucose 99.67 mg/dL    Comment: Performed at [facility] [address], [place], [place] [address]  Glucose, capillary     Status: Abnormal   Collection Time: 07/11/19 11:35 AM  Result Value Ref Range   Glucose-Capillary 109 (H) 70 - 99 mg/dL    Comment: Glucose reference range applies only to samples taken after fasting for at least 8 hours.   No components found for: ESR, C REACTIVE PROTEIN MICRO: Recent Results (from the past 720 hour(s))  Blood Culture (routine x 2)     Status: Abnormal   Collection Time: 07/05/19  2:52 PM   Specimen: BLOOD  Result Value Ref Range Status   Specimen Description   Final    BLOOD RIGHT ANTECUBITAL Performed at [facility], [address] [place], [place] [address]    Special Requests   Final    BOTTLES DRAWN AEROBIC AND ANAEROBIC Blood Culture adequate volume Performed at [facility], [address] [place], [place] [address]    Culture  Setup Time   Final    IN BOTH AEROBIC AND ANAEROBIC BOTTLES GRAM NEGATIVE RODS CRITICAL VALUE NOTED.  VALUE IS CONSISTENT WITH PREVIOUSLY REPORTED AND CALLED VALUE. Performed at [facility], [address] [place], [place] [address]    Culture (A)  Final    PROTEUS MIRABILIS SUSCEPTIBILITIES PERFORMED ON PREVIOUS CULTURE WITHIN THE LAST 5 DAYS. Performed at [facility], [place] [address] [place], [place] [address]    Report Status 07/08/2019 FINAL  Final  Blood Culture (routine x 2)     Status: Abnormal   Collection Time: 07/05/19  2:57 PM   Specimen: BLOOD  Result Value Ref Range Status   Specimen  Description   Final    BLOOD LEFT ANTECUBITAL Performed at [facility], [address] [place], [place] [address]    Special Requests   Final    BOTTLES DRAWN AEROBIC AND ANAEROBIC Blood Culture adequate volume Performed at [facility], [address] [place], [place] [address]    Culture  Setup Time   Final    ANAEROBIC BOTTLE ONLY GRAM NEGATIVE RODS CRITICAL RESULT CALLED TO, READ BACK BY AND VERIFIED WITH: SCOTT HALL ON 07/06/19 AT 0446 [facility] Performed at [facility], [place] [address] [place], [place] [address]    Culture PROTEUS MIRABILIS (A)  Final   Report Status 07/08/2019 FINAL  Final   Organism ID, Bacteria PROTEUS MIRABILIS  Final      Susceptibility   Proteus mirabilis - MIC*    AMPICILLIN >=32 RESISTANT Resistant     CEFAZOLIN >=64 RESISTANT Resistant     CEFEPIME 8 INTERMEDIATE Intermediate     CEFTAZIDIME RESISTANT Resistant     CEFTRIAXONE 32 RESISTANT Resistant     CIPROFLOXACIN >=4 RESISTANT Resistant     GENTAMICIN <=1 SENSITIVE Sensitive     IMIPENEM 2 SENSITIVE Sensitive     TRIMETH/SULFA >=320 RESISTANT Resistant     AMPICILLIN/SULBACTAM >=32 RESISTANT Resistant     PIP/TAZO <=4 SENSITIVE Sensitive     * PROTEUS MIRABILIS  Blood Culture ID Panel (Reflexed)     Status: Abnormal   Collection Time: 07/05/19  2:57 PM  Result Value Ref Range Status   Enterococcus species NOT DETECTED NOT DETECTED Final   Listeria monocytogenes NOT DETECTED NOT DETECTED Final   Staphylococcus species NOT DETECTED NOT DETECTED  Final   Staphylococcus aureus (BCID) NOT DETECTED NOT DETECTED Final   Streptococcus species NOT DETECTED NOT DETECTED Final   Streptococcus agalactiae NOT DETECTED NOT DETECTED Final   Streptococcus pneumoniae NOT DETECTED NOT DETECTED Final   Streptococcus pyogenes NOT DETECTED NOT DETECTED Final   Acinetobacter baumannii NOT DETECTED NOT DETECTED Final   Enterobacteriaceae species DETECTED (A) NOT DETECTED Final    Comment:  Enterobacteriaceae represent a large family of gram-negative bacteria, not a single organism. CRITICAL RESULT CALLED TO, READ BACK BY AND VERIFIED WITH: SCOTT HALL ON 07/06/19 AT 0446 [facility]    Enterobacter cloacae complex NOT DETECTED NOT DETECTED Final   Escherichia coli NOT DETECTED NOT DETECTED Final   Klebsiella oxytoca NOT DETECTED NOT DETECTED Final   Klebsiella pneumoniae NOT DETECTED NOT DETECTED Final   Proteus species DETECTED (A) NOT DETECTED Final    Comment: CRITICAL RESULT CALLED TO, READ BACK BY AND VERIFIED WITH: SCOTT HALL ON 07/06/19 AT 0446 [place]    Serratia marcescens NOT DETECTED NOT DETECTED Final   Carbapenem resistance NOT DETECTED NOT DETECTED Final   Haemophilus influenzae NOT DETECTED NOT DETECTED Final   Neisseria meningitidis NOT DETECTED NOT DETECTED Final   Pseudomonas aeruginosa NOT DETECTED NOT DETECTED Final   Candida albicans NOT DETECTED NOT DETECTED Final   Candida glabrata NOT DETECTED NOT DETECTED Final   Candida krusei NOT DETECTED NOT DETECTED Final   Candida parapsilosis NOT DETECTED NOT DETECTED Final   Candida tropicalis NOT DETECTED NOT DETECTED Final    Comment: Performed at [facility], [address] [place], [place] [address]  Urine culture     Status: Abnormal   Collection Time: 07/05/19  4:32 PM   Specimen: Urine, Random  Result Value Ref Range Status   Specimen Description   Final    URINE, RANDOM Performed at [facility], [address] [place], [place] [address]    Special Requests   Final    NONE Performed at [facility], [address] [place],  [address]    Culture >=100,000 COLONIES/mL PROTEUS MIRABILIS (A)  Final   Report Status 07/07/2019 FINAL  Final   Organism ID, Bacteria PROTEUS MIRABILIS (A)  Final      Susceptibility   Proteus mirabilis - MIC*    AMPICILLIN >=32 RESISTANT Resistant     CEFAZOLIN >=64 RESISTANT Resistant     CEFTRIAXONE 32 RESISTANT Resistant      CIPROFLOXACIN >=4 RESISTANT Resistant     GENTAMICIN <=1 SENSITIVE Sensitive     IMIPENEM 4 SENSITIVE Sensitive     NITROFURANTOIN 128 RESISTANT Resistant     TRIMETH/SULFA >=320 RESISTANT Resistant     AMPICILLIN/SULBACTAM 16 INTERMEDIATE Intermediate     PIP/TAZO <=4 SENSITIVE Sensitive     * >=100,000 COLONIES/mL PROTEUS MIRABILIS  Respiratory Panel by RT PCR (Flu A&B, Covid) - Nasopharyngeal Swab     Status: None   Collection Time: 07/05/19  4:40 PM   Specimen: Nasopharyngeal Swab  Result Value Ref Range Status   SARS Coronavirus 2 by RT PCR NEGATIVE NEGATIVE Final    Comment: (NOTE) SARS-CoV-2 target nucleic acids are NOT DETECTED. The SARS-CoV-2 RNA is generally detectable in upper respiratoy specimens during the acute phase of infection. The lowest concentration of SARS-CoV-2 viral copies this assay can detect is 131 copies/mL. A negative result does not preclude SARS-Cov-2 infection and should not be used as the sole basis for treatment or other patient management decisions. A negative result may occur with  improper specimen collection/handling, submission of specimen other than nasopharyngeal swab, presence of viral mutation(s) within the areas targeted by this assay, and inadequate number of viral copies (<131 copies/mL). A negative result must be combined with clinical observations, patient history, and epidemiological information. The expected result is Negative. Fact Sheet for Patients:  PinkCheek.be Fact Sheet for Healthcare Providers:  GravelBags.it This test is not yet ap proved or cleared by the [place] FDA and  has been authorized for detection and/or diagnosis of SARS-CoV-2 by FDA under an Emergency Use Authorization (EUA). This EUA will remain  in effect (meaning this test can be used) for the duration of the COVID-19 declaration under Section 564(b)(1) of the Act, 21 U.S.C. section 360bbb-3(b)(1),  unless the authorization is terminated or revoked sooner.    Influenza A by PCR NEGATIVE NEGATIVE Final   Influenza B by PCR NEGATIVE NEGATIVE Final    Comment: (NOTE) The Xpert Xpress SARS-CoV-2/FLU/RSV assay is intended as an aid in  the diagnosis of influenza from Nasopharyngeal swab specimens and  should not be used as a sole basis for treatment. Nasal washings and  aspirates are unacceptable for Xpert Xpress SARS-CoV-2/FLU/RSV  testing. Fact Sheet for Patients: PinkCheek.be Fact Sheet for Healthcare Providers: GravelBags.it This test is not yet approved or cleared by the [place] FDA and  has been authorized for detection and/or diagnosis of SARS-CoV-2 by  FDA under an Emergency Use Authorization (EUA). This EUA will remain  in effect (meaning this test can be used) for the duration of the  Covid-19 declaration under Section 564(b)(1) of the Act, 21  U.S.C. section 360bbb-3(b)(1), unless the authorization is  terminated or revoked. Performed at [facility], [address] [place], [place] [address]   MRSA PCR Screening     Status: None   Collection Time: 07/05/19  6:12 PM   Specimen: Nasopharyngeal  Result Value Ref Range Status   MRSA by PCR NEGATIVE NEGATIVE Final    Comment:        The GeneXpert MRSA Assay (FDA approved for NASAL specimens only), is one component of a comprehensive MRSA colonization surveillance program. It is not intended to diagnose MRSA infection nor to guide or monitor treatment for MRSA infections. Performed at [facility], [address], [place], [place] [address]   Urine Culture     Status: Abnormal   Collection Time: 07/05/19 11:25 PM   Specimen: PATH Other; Urine  Result Value Ref Range Status   Specimen Description   Final    CYSTOSCOPY URINE, RANDOM RIGHT RENAL PELVIS Performed at [facility], [address], [place], [place] [address]     Special Requests   Final    NONE Performed at [facility], [place], [place] [address]    Culture 80,000 COLONIES/mL PROTEUS MIRABILIS (A)  Final   Report Status 07/08/2019 FINAL  Final   Organism ID, Bacteria PROTEUS MIRABILIS (A)  Final      Susceptibility   Proteus mirabilis - MIC*    AMPICILLIN >=32 RESISTANT Resistant     CEFAZOLIN >=64 RESISTANT Resistant     CEFEPIME 8 INTERMEDIATE Intermediate     CEFTAZIDIME RESISTANT Resistant     CEFTRIAXONE 32 RESISTANT Resistant     CIPROFLOXACIN >=4 RESISTANT Resistant     GENTAMICIN <=1 SENSITIVE Sensitive     IMIPENEM 2 SENSITIVE Sensitive     TRIMETH/SULFA >=320 RESISTANT Resistant     AMPICILLIN/SULBACTAM >=32 RESISTANT Resistant  PIP/TAZO <=4 SENSITIVE Sensitive     * 80,000 COLONIES/mL PROTEUS MIRABILIS  Culture, respiratory (non-expectorated)     Status: None   Collection Time: 07/06/19  3:01 AM   Specimen: Tracheal Aspirate; Respiratory  Result Value Ref Range Status   Specimen Description   Final    TRACHEAL ASPIRATE Performed at [facility], [address] [place], [place] [address]    Special Requests   Final    Normal Performed at [facility], [address] [place], [place] [address]    Gram Stain   Final    FEW WBC PRESENT, PREDOMINANTLY PMN NO ORGANISMS SEEN Performed at [facility], [place] [address], [place], [place] [address]    Culture RARE KLEBSIELLA PNEUMONIAE  Final   Report Status 07/08/2019 FINAL  Final   Organism ID, Bacteria KLEBSIELLA PNEUMONIAE  Final      Susceptibility   Klebsiella pneumoniae - MIC*    AMPICILLIN >=32 RESISTANT Resistant     CEFAZOLIN <=4 SENSITIVE Sensitive     CEFEPIME <=0.12 SENSITIVE Sensitive     CEFTAZIDIME <=1 SENSITIVE Sensitive     CEFTRIAXONE <=0.25 SENSITIVE Sensitive     CIPROFLOXACIN <=0.25 SENSITIVE Sensitive     GENTAMICIN <=1 SENSITIVE Sensitive     IMIPENEM <=0.25 SENSITIVE Sensitive      TRIMETH/SULFA <=20 SENSITIVE Sensitive     AMPICILLIN/SULBACTAM 8 SENSITIVE Sensitive     PIP/TAZO <=4 SENSITIVE Sensitive     * RARE KLEBSIELLA PNEUMONIAE    IMAGING: CT HEAD WO CONTRAST  Result Date: 07/07/2019 CLINICAL DATA:  Per RN pt with increased confusion and speaking in nonsense, started about 1 hour again. Pt with recent extubation and recent surgery. Hx of stroke. EXAM: CT HEAD WITHOUT CONTRAST TECHNIQUE: Contiguous axial images were obtained from the base of the skull through the vertex without intravenous contrast. COMPARISON:  CT head 07/05/2019 FINDINGS: Brain: No evidence of acute infarction, hemorrhage, hydrocephalus, extra-axial collection or mass lesion/mass effect. There is an old large right occipital infarct and smaller left parietal infarct. Diffuse periventricular white matter hypoattenuation consistent with chronic small vessel ischemic change. Diffuse atrophy. Vascular: No hyperdense vessel or unexpected calcification. Skull: Normal. Negative for fracture or focal lesion. Sinuses/Orbits: Left maxillary sinus mucosal thickening the remaining paranasal sinuses are clear. Orbits are unremarkable. Other: None. IMPRESSION: 1. No acute intracranial abnormality. 2. Chronic findings as above. Electronically Signed   By: Audie Pinto M.D.   On: 07/07/2019 17:40   CT HEAD WO CONTRAST  Result Date: 07/05/2019 CLINICAL DATA:  Altered mental status. EXAM: CT HEAD WITHOUT CONTRAST TECHNIQUE: Contiguous axial images were obtained from the base of the skull through the vertex without intravenous contrast. COMPARISON:  July 21, 2009 FINDINGS: Brain: No evidence of acute infarction, hemorrhage, hydrocephalus, extra-axial collection or mass lesion/mass effect. 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 Atrophy and chronic microvascular ischemic changes are noted which have progressed since the prior study in 2011. Vascular: No hyperdense vessel or unexpected calcification.  Skull: Normal. Negative for fracture or focal lesion. Sinuses/Orbits: There is mucosal thickening of the left maxillary sinus. The remaining paranasal sinuses and mastoid air cells are essentially clear. Other: None. IMPRESSION: 1. No acute intracranial abnormality. 2. Chronic findings as detailed above. Electronically Signed   By: Constance Holster M.D.   On: 07/05/2019 21:09   DG Chest Port 1 View  Result Date: 07/06/2019 CLINICAL DATA:  Central line placement EXAM: PORTABLE CHEST 1 VIEW COMPARISON:  July 06, 2019 FINDINGS: There is a newly placed right-sided central venous catheter with tip projecting over the proximal SVC. Endotracheal tube terminates approximately 3.7 cm above the carina. There is improved aeration in the right upper lung. Scattered airspace opacities are again noted including masslike consolidation involving the right upper lobe and right perihilar region. IMPRESSION: 1. Lines and tubes as above. 2. No pneumothorax. 3. Improved aeration in the right upper lobe. 4. Persistent bilateral airspace opacities with masslike consolidation in the right upper lobe and right perihilar region. Consider further evaluation with a nonemergent contrast enhanced CT. Electronically Signed   By: Constance Holster M.D.   On: 07/06/2019 02:13   Portable Chest x-ray  Result Date: 07/06/2019 CLINICAL DATA:  Endotracheal tube placement EXAM: PORTABLE CHEST 1 VIEW COMPARISON:  July 05, 2019 FINDINGS: There is new near complete opacification of the right upper lobe. The endotracheal tube terminates approximately 4.3 cm above the carina. Scattered bilateral airspace opacities are again noted. There is elevation of the right hemidiaphragm. There is no pneumothorax or large pleural effusion. IMPRESSION: 1. Endotracheal tube as above. 2. New short interval development of near complete opacification of the right upper lobe with right-sided volume loss is concerning for interval mucous plugging. 3. Again noted are  scattered bilateral airspace opacities, similar to prior study. Electronically Signed   By: Constance Holster M.D.   On: 07/06/2019 00:29   DG Chest Port 1 View  Result Date: 07/05/2019 CLINICAL DATA:  Weakness, evaluate for pneumonia. Additional history provided: Generalized weakness for 2 days, history of COPD, swelling in bilateral legs. EXAM: PORTABLE CHEST 1 VIEW COMPARISON:  Chest CT 12/24/2018, FINDINGS: Heart size within normal limits. Redemonstrated masslike opacity as well as smaller nodular opacities within the right upper lobe. More ill-defined opacities are present within the bilateral lung bases which may reflect atelectasis or pneumonia. No evidence of pleural effusion or pneumothorax. No acute bony abnormality. Overlying cardiac monitoring leads IMPRESSION: Redemonstrated masslike opacity as well as smaller nodular opacities within the right upper lobe. Findings are again suspicious for malignancy. Please refer to chest CT 12/24/2018 for description. Interstitial and more ill-defined opacities within the left greater than right lung bases, which may reflect atelectasis or pneumonia. Aortic atherosclerosis. Electronically Signed   By: Kellie Simmering DO   On: 07/05/2019 15:33   DG OR UROLOGY CYSTO IMAGE ([place])  Result Date: 07/05/2019 There is no interpretation for this exam.  This order is for images obtained during a surgical procedure.  Please See "Surgeries" Tab for more information regarding the procedure.   CT Renal Stone Study  Result Date: 07/05/2019 CLINICAL DATA:  Right flank pain. Nephrolithiasis. EXAM: CT ABDOMEN AND PELVIS WITHOUT CONTRAST TECHNIQUE: Multidetector CT imaging of the abdomen and pelvis was performed following the standard protocol without IV contrast. COMPARISON:  None. FINDINGS: Lower chest: No acute findings. Hepatobiliary: No mass visualized on this unenhanced exam. Calcified gallstone seen, however there is no evidence of cholecystitis or biliary dilatation.  Pancreas: No mass or inflammatory process visualized on this unenhanced exam. Spleen:  Within normal limits in size. Adrenals/Urinary tract: Normal appearance of adrenal glands. Small fluid attenuation right renal cyst noted. A partial staghorn calculus is seen in the lower pole collecting system of the right kidney. Several other renal calculi are seen bilaterally measuring up to 6 mm. Mild right hydronephrosis is seen due to a 5 mm calculus in the proximal right ureter. Right renal swelling and mild perinephric stranding also noted. Stomach/Bowel: Small hiatal  hernia noted. No evidence of obstruction, inflammatory process, or abnormal fluid collections. Normal appendix visualized. Vascular/Lymphatic: No pathologically enlarged lymph nodes identified. No evidence of abdominal aortic aneurysm. Aortic atherosclerosis incidentally noted. Reproductive:  No mass or other significant abnormality. Other:  None. Musculoskeletal:  No suspicious bone lesions identified. IMPRESSION: 1. Mild right hydronephrosis due to 5 mm proximal right ureteral calculus. 2. 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 3. Cholelithiasis. No radiographic evidence of cholecystitis. 4. Small hiatal hernia. Electronically Signed   By: Marlaine Hind M.D.   On: 07/05/2019 18:22   [place] EKG SITE RITE  Result Date: 07/11/2019 If Site Rite image not attached, placement could not be confirmed due to current cardiac rhythm.   Assessment:   SHARIQ PUIG is a 69 y.o. 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 Has stent in place and doing well currently. Planned surgical intervention appox 3/23.    Recommendations Place PICC Iv zosyn per opat order sheet until one week after stones removed due to high risk of recurrent infection and sepsis with colonized stones in place. Discussed plan with patient, his girlfriend and Dr  Bernardo Heater.  Thank you very much for allowing me to participate in the care of this patient. Please call with questions.   Cheral Marker. Ola Spurr, MD

## 2019-07-11 NOTE — TOC Initial Note (Signed)
Transition of Care [facility]) - Initial/Assessment Note    Patient Details  Name: Shawn Lindsey MRN: [id number] Date of Birth: May 18, 1950  Transition of [facility]) CM/SW Contact:    Shelbie Hutching, RN Phone Number: 07/11/2019, 9:22 AM  Clinical Narrative:                 Patient is from home where he lives with his significant other [name].  Patient's goal is to get back home as soon as he can.  He understands that he needs short term skilled rehab to regain his strength.  Patient is agreeable to short term rehab, this RNCM has provided patient with list of Medicare approved skilled nursing facilities in this area.  Patient reports that he is leaning toward [facility] but wants to speak with [name] before making a decision.   Patient reports that before he came into the hospital he was independent in ADL's and walked with a walker and has a wheelchair that he uses occasionally. Patient is on chronic O2 at 2L, provided at home by [place].   SNF bed search has been started.  RNCM will follow up with patient this afternoon.    Expected Discharge Plan: Skilled Nursing Facility Barriers to Discharge: Continued Medical Work up   Patient Goals and CMS Choice Patient states their goals for this hospitalization and ongoing recovery are:: to get better and go home- agreable to short term rehab so he can get strong enough to go home CMS Medicare.gov Compare Post Acute Care list provided to:: Patient Choice offered to / list presented to : Patient  Expected Discharge Plan and Services Expected Discharge Plan: [place]   Discharge Planning Services: CM Consult Post Acute Care Choice: [place] Living arrangements for the past 2 months: Single Family Home                                      Prior Living Arrangements/Services Living arrangements for the past 2 months: Single Family Home Lives with:: Significant Other Patient language and need for interpreter  reviewed:: Yes Do you feel safe going back to the place where you live?: Yes      Need for Family Participation in Patient Care: Yes (Comment) Care giver support system in place?: Yes (comment)(significant other, [name] and daughter) Current home services: DME(wheelchair, rollator) Criminal Activity/Legal Involvement Pertinent to Current Situation/Hospitalization: No - Comment as needed  Activities of Daily Living Home Assistive Devices/Equipment: None ADL Screening (condition at time of admission) Patient's cognitive ability adequate to safely complete daily activities?: Yes Is the patient deaf or have difficulty hearing?: No Does the patient have difficulty seeing, even when wearing glasses/contacts?: No Does the patient have difficulty concentrating, remembering, or making decisions?: No Patient able to express need for assistance with ADLs?: Yes Does the patient have difficulty dressing or bathing?: Yes Independently performs ADLs?: Yes (appropriate for developmental age) Does the patient have difficulty walking or climbing stairs?: Yes Weakness of Legs: Both Weakness of Arms/Hands: Both  Permission Sought/Granted Permission sought to share information with : Case Manager, Customer service manager, Family Supports Permission granted to share information with : Yes, Verbal Permission Granted     Permission granted to share info w AGENCY: SNF's in [place] granted to share info w Relationship: significant other and daughter     Emotional Assessment Appearance:: Appears older than stated age Attitude/Demeanor/Rapport: Engaged  Affect (typically observed): Accepting Orientation: : Oriented to Self, Oriented to Place, Oriented to  Time, Oriented to Situation Alcohol / Substance Use: Tobacco Use Psych Involvement: No (comment)  Admission diagnosis:  Acute respiratory failure with hypoxia ([place]) [J96.01] Septic shock ([facility]) [A41.9, R65.21] Sepsis without  acute organ dysfunction, due to unspecified organism [facility]) [A41.9] Patient Active Problem List   Diagnosis Date Noted  . Acute respiratory failure with hypoxia ([place])   . Septic shock ([place]) 07/05/2019  . Mass of upper lobe of right lung 09/29/2015  . 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 HTN (hypertension) 08/05/2014  . COPD (chronic obstructive pulmonary disease) ([place]) 08/05/2014  . Hyperlipidemia 08/05/2014   PCP:  Laurell Josephs, MD Pharmacy:   [facility] [place] [address] Phone: [phone] Fax: [phone]  [facility] [place], [place] [address] [place] [address] Phone: [phone] Fax: [phone]     Social Determinants of Health (SDOH) Interventions    Readmission Risk Interventions No flowsheet data found.

## 2019-07-11 NOTE — Progress Notes (Signed)
PHARMACY CONSULT NOTE FOR:  OUTPATIENT  PARENTERAL ANTIBIOTIC THERAPY (OPAT)  Indication: Proteus Urosepsis and Pyelo Regimen: Zosyn 3.375g IV q8H End date: 08/05/2019  IV antibiotic discharge orders are pended. To discharging provider:  please sign these orders via discharge navigator,  Select New Orders & click on the button choice - Manage This Unsigned Work.     Thank you for allowing pharmacy to be a part of this patient's care.  Lu Duffel, PharmD, BCPS Clinical Pharmacist 07/11/2019 3:24 PM

## 2019-07-11 NOTE — Plan of Care (Signed)
Verbalizes understanding of plan of care and is open to looking at rehabs that are close to home in order for significant other to be able to visit

## 2019-07-11 NOTE — Progress Notes (Signed)
Patient ID: Shawn Lindsey, male   DOB: 1951-04-30, 69 y.o.   MRN: [id number]  Discussed with Dr. Ola Spurr infectious disease given multidrug-resistant Proteus sepsis with UTI patient will need prolonged IV antibiotic. Will get PICC line placed. Dr. Ola Spurr will do the antibiotic form. He plans to discuss with Dr. Bernardo Heater definitive treatment for right side staghorn calculus.  above was discussed with patient's girlfriend [place]

## 2019-07-11 NOTE — Progress Notes (Signed)
Pharmacy Electrolyte Monitoring Consult:  Pharmacy consulted to assist in monitoring and replacing electrolytes in this 69 y.o. male admitted on 07/05/2019 with Proteus Bacteremia.   Labs:  Sodium (mmol/L)  Date Value  07/11/2019 139  08/28/2014 137   Potassium (mmol/L)  Date Value  07/11/2019 3.6  08/28/2014 3.5   Magnesium (mg/dL)  Date Value  07/11/2019 2.0   Phosphorus (mg/dL)  Date Value  07/11/2019 3.0   Calcium (mg/dL)  Date Value  07/11/2019 8.6 (L)   Calcium, Total (mg/dL)  Date Value  08/28/2014 9.6   Albumin (g/dL)  Date Value  07/05/2019 3.5  08/28/2014 4.0    Assessment/Plan: Phos, Mg, wnl's - will recheck in 2 days.  K = 3.6  Will give KCL 66meq po x 1, and recheck BMP tomorrow am  Will replace for goal potassium ~ 4, goal magnesium ~ 2, and goal phosphorus ~ 2.5.   Pharmacy will continue to monitor and adjust per consult.   Lu Duffel, PharmD, BCPS Clinical Pharmacist 07/11/2019 7:40 AM

## 2019-07-12 DIAGNOSIS — Z794 Long term (current) use of insulin: Secondary | ICD-10-CM

## 2019-07-12 DIAGNOSIS — E1142 Type 2 diabetes mellitus with diabetic polyneuropathy: Secondary | ICD-10-CM

## 2019-07-12 DIAGNOSIS — N200 Calculus of kidney: Secondary | ICD-10-CM

## 2019-07-12 DIAGNOSIS — N201 Calculus of ureter: Secondary | ICD-10-CM

## 2019-07-12 MED ORDER — LOSARTAN POTASSIUM 25 MG PO TABS: 25.0000 mg | ORAL_TABLET | Freq: Every day | ORAL | 0 refills | Status: DC

## 2019-07-12 MED ORDER — ASPIRIN 81 MG PO TBEC: 81.0000 mg | DELAYED_RELEASE_TABLET | Freq: Every day | ORAL | 1 refills | Status: DC

## 2019-07-12 MED ORDER — PIPERACILLIN-TAZOBACTAM IV (FOR PTA / DISCHARGE USE ONLY): 3.3750 g | Freq: Three times a day (TID) | INTRAVENOUS | 0 refills | Status: AC

## 2019-07-12 NOTE — Discharge Instructions (Signed)
PICC line per Protocol

## 2019-07-12 NOTE — TOC Progression Note (Signed)
Transition of Care [facility]) - Progression Note    Patient Details  Name: Shawn Lindsey MRN: [id number] Date of Birth: [birth year]  Transition of Care [facility]) CM/SW Contact  Shelbie Hutching, RN Phone Number: 07/12/2019, 10:12 AM  Clinical Narrative:    Report has been called to [facility] and Transport scheduled with EMS- patient is next up for pick up.    Expected Discharge Plan: [place] Barriers to Discharge: Barriers Resolved  Expected Discharge Plan and Services Expected Discharge Plan: [place]   Discharge Planning Services: CM Consult Post Acute Care Choice: [place] Living arrangements for the past 2 months: Single Family Home Expected Discharge Date: 07/12/19                                     Social Determinants of Health (SDOH) Interventions    Readmission Risk Interventions No flowsheet data found.

## 2019-07-12 NOTE — Discharge Summary (Signed)
Shawn Lindsey NAME: Shawn Lindsey    MR#:  [id number]  DATE OF BIRTH:  11-29-1950  DATE OF ADMISSION:  07/05/2019 ADMITTING PHYSICIAN: No admitting provider for patient encounter.  DATE OF DISCHARGE: 07/12/2019  PRIMARY CARE PHYSICIAN: Laurell Josephs, MD    ADMISSION DIAGNOSIS:  Acute respiratory failure with hypoxia (Shawn [facility]) [J96.01] Septic shock (Shawn [facility]) [A41.9, R65.21] Sepsis without acute organ dysfunction, due to unspecified organism (Shawn [facility]) [A41.9]  DISCHARGE DIAGNOSIS:  #Severe septic shock 32 Proteus UTI/pyelonephritis with ureteral and kidney stone  #Bilateral nephrology asses with staghorn calculus right kidney and right ureteral stone status post emergent stent placement right ureter  #Acute renal failure/ATN in the setting of severe sepsis  #Acute hypoxic respiratory failure secondary to right upper lobe lung collapse with COPD flare in the setting of sepsis  SECONDARY DIAGNOSIS:   Past Medical History:  Diagnosis Date  . Anxiety    mild  . Cancer ([place]) 2015   tonsil  . COPD (chronic obstructive pulmonary disease) ([facility])   . Diabetes mellitus without complication (Shawn [facility])   . Elevated cholesterol   . GERD (gastroesophageal reflux disease)   . Hypertension   . Hypothyroidism   . Seizures ([name]) 2015   related to sepsis  . Stroke [facility]) 2007   no peripheral vision on left side of both eyes & left arm gets numb under stress    [facility] COURSE:  Axyl Sitzman Catesis a 69 y.o.male past medical history of consular cancer status post chemo, COPD on home oxygen at night, [facility], hypertension, seizure disorder, stroke comes to the emergency room forevaluation of generalized weakness malaise is progressively worse over the past fewdays.   Severe septic shock secondary to UTI/pyelonephritis with ureterolithiasis and nephrolithiasis, with acute renal failure/ATN, acute hypoxic respiratory failure secondary to acute on chronic COPD  exacerbation requiring mechanical intubation -BC Proteus-- resistant to multiple antibiotics -patient currently on IV Zosyn  -ID consultation with Dr. Ola Spurr -- recommends continue IV Zosyn till April 5 th-- patient will follow-up with Dr. Ola Spurr thereafter -PICC line placed on 311 2021 -Repeat blood culture negative -blood pressure stable. Patient did require IV pressors in the ICU  Bilateral nephrolithiasis with staghorn calculus right kidney and right ureteral stone status post emergent stent placement by Dr. Bernardo Heater given severe sepsis due to UTI -urine culture growing Proteus-- resistant to multiple antibiotics -continue IV Zosyn for now -patient making good urine. -Okay to discontinue Foley catheter -Discussed with Dr. Bernardo Heater-- Pt is tentatively scheduled for ureteroscopy on March 23rd  Acute renal failure/ATN in the setting of severe sepsis -patient came in with creatinine of 2.91--- 0.96 -DCed  Foley catheter  Hypokalemia repleted  Acute on chronic hypoxic respiratory failure secondary to COPD exacerbation with Klebsiella pneumonia -chest x-ray shows right upper lobe opacity-- chronic -required mechanical ventilation for couple days. Extubated successfully -patient has had extensive workup in the past with bronchoscopy and biopsy from right upper lobe which was negative for malignancy in 2017 -tracheal  aspirate positive for Klebsiella -on  IV Zosyn -incentive spirometer -prn inhalers  History of chronic seizure disorder -on  Keppra  Hypertension continue amlodipine and started low dose losartan 25 mg (was on 100 mg at home)  Hyperlipidemia continue statins  Type II diabetes, well-controlled with peripheral neuropathy  -pt not on any medication -continue CBG TID -A1c 5.1 -continue gabapentin  Hypothyroidism continue Synthroid  history of stroke with no peripheral vision on the left with some numbness in the  left arm -- continue  Lipitor -start baby aspirin for secondary prevention  Tobacco abuse -patient was advised on smoking cessation  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 Patient underwent bronchoscopy with biopsy in 2017 that was negative for malignancy. Patient currently remains asymptomatic and according to ICU attending does not have interest in pursuing another biopsy.   Procedures: right ureteral stent placement on March 6 PICC line march 11th Family communication :[name], girlfriend on the phone Consults : ICU, ID, urology Discharge Disposition : To [facility] today CODE STATUS: full DVT Prophylaxis : Lovenox Barriers to discharge: none  CONSULTS OBTAINED:  Treatment Team:  Abbie Sons, MD Leonel Ramsay, MD  DRUG ALLERGIES:  No Known Allergies  DISCHARGE MEDICATIONS:   Allergies as of 07/12/2019   No Known Allergies     Medication List    STOP taking these medications   Incruse Ellipta 62.5 MCG/INH Aepb Generic drug: umeclidinium bromide     TAKE these medications   amLODipine 5 MG tablet Commonly known as: NORVASC Take 5 mg by mouth daily.   aspirin 81 MG EC tablet Take 1 tablet (81 mg total) by mouth daily.   atorvastatin 80 MG tablet Commonly known as: LIPITOR Take 80 mg by mouth daily after breakfast.   Cholecalciferol 50 MCG (2000 UT) Caps Take 2,000 Units by mouth daily.   Fish Oil 1000 MG Caps Take 1,000 mg by mouth daily.   FLUoxetine 10 MG capsule Commonly known as: PROZAC Take 10 mg by mouth daily.   gabapentin 100 MG capsule Commonly known as: NEURONTIN Take 600 mg by mouth at bedtime.   levETIRAcetam 500 MG tablet Commonly known as: KEPPRA Take 500 mg by mouth 2 (two) times daily.   levothyroxine 125 MCG tablet Commonly known as: SYNTHROID Take 125 mcg by mouth daily before breakfast.   losartan 25 MG  tablet Commonly known as: COZAAR Take 1 tablet (25 mg total) by mouth at bedtime. What changed:  medication strength how much to take   magnesium oxide 400 MG tablet Commonly known as: MAG-OX Take 400 mg by mouth 2 (two) times daily.   omeprazole 20 MG capsule Commonly known as: PRILOSEC Take 20 mg by mouth daily.   piperacillin-tazobactam  IVPB Commonly known as: ZOSYN Inject 3.375 g into the vein every 8 (eight) hours for 25 days. Indication:  Proteus Urosepsis and Pyelo Last Day of Therapy:  08/05/2019 Labs - Once weekly:  CBC/D and BMP, Labs - Every other week:  ESR and CRP   Ventolin HFA 108 (90 Base) MCG/ACT inhaler Generic drug: albuterol Inhale 1-2 puffs into the lungs every 6 (six) hours as needed (for shortness of breath/wheezing.).            Home Infusion Instuctions  (From admission, onward)         Start     Ordered   07/12/19 0000  Home infusion instructions    Question:  Instructions  Answer:  Flushing of vascular access device: 0.9% NaCl pre/post medication administration and prn patency; Heparin 100 u/ml, 21m for implanted ports and Heparin 10u/ml, 564mfor all other central venous catheters.   07/12/19 0849          If you experience worsening of your admission symptoms, develop shortness of breath, life threatening emergency, suicidal or homicidal thoughts you must seek medical attention immediately by calling 911 or  calling your MD immediately  if symptoms less severe.  You Must read complete instructions/literature along with all the possible adverse reactions/side effects for all the Medicines you take and that have been prescribed to you. Take any Shawn Medicines after you have completely understood and accept all the possible adverse reactions/side effects.   Please note  You were cared for by a hospitalist during your [facility] stay. If you have any questions about your discharge medications or the care you received while you were in the  [facility] after you are discharged, you can call the unit and asked to speak with the hospitalist on call if the hospitalist that took care of you is not available. Once you are discharged, your primary care physician will handle any further medical issues. Please note that NO REFILLS for any discharge medications will be authorized once you are discharged, as it is imperative that you return to your primary care physician (or establish a relationship with a primary care physician if you do not have one) for your aftercare needs so that they can reassess your need for medications and monitor your lab values. Today   SUBJECTIVE  Doing well Appreciates care received here   VITAL SIGNS:  Blood pressure (!) 120/103, pulse 79, temperature 98.3 F (36.8 C), temperature source Oral, resp. rate 14, height 5' 10"  (1.778 m), weight 121.2 kg, SpO2 95 %.  I/O:    Intake/Output Summary (Last 24 hours) at 07/12/2019 0905 Last data filed at 07/12/2019 0024 Gross per 24 hour  Intake 480 ml  Output 1125 ml  Net -645 ml    PHYSICAL EXAMINATION:  GENERAL:  69 y.o.-year-old patient lying in the bed with no acute distress. Obese EYES: Pupils equal, round, reactive to light and accommodation. No scleral icterus.  HEENT: Head atraumatic, normocephalic. Oropharynx and nasopharynx clear.  NECK:  Supple, no jugular venous distention. No thyroid enlargement, no tenderness.  LUNGS: Normal breath sounds bilaterally, no wheezing, rales,rhonchi or crepitation. No use of accessory muscles of respiration.  CARDIOVASCULAR: S1, S2 normal. No murmurs, rubs, or gallops.  ABDOMEN: Soft, non-tender, non-distended. Bowel sounds present. No organomegaly or mass.  EXTREMITIES: No pedal edema, cyanosis, or clubbing. PICC+ right arm NEUROLOGIC: Cranial nerves II through XII are intact. Muscle strength 5/5 in all extremities. Sensation intact. Gait not checked.  PSYCHIATRIC: patient is alert and oriented x 3.  SKIN: No obvious  rash, lesion, or ulcer.   DATA REVIEW:   CBC  Recent Labs  Lab 07/11/19 0526  WBC 15.0*  HGB 14.7  HCT 43.2  PLT 128*    Chemistries  Recent Labs  Lab 07/05/19 1452 07/06/19 0323 07/11/19 0526  NA 136   < > 139  K 4.5   < > 3.6  CL 101   < > 102  CO2 21*   < > 30  GLUCOSE 69*   < > 102*  BUN 33*   < > 19  CREATININE 2.91*   < > 1.02  CALCIUM 9.0   < > 8.6*  MG  --    < > 2.0  AST 30  --   --   ALT 13  --   --   ALKPHOS 93  --   --   BILITOT 2.2*  --   --    < > = values in this interval not displayed.    Microbiology Results   Recent Results (from the past 240 hour(s))  Blood Culture (routine x 2)  Status: Abnormal   Collection Time: 07/05/19  2:52 PM   Specimen: BLOOD  Result Value Ref Range Status   Specimen Description   Final    BLOOD RIGHT ANTECUBITAL Performed at [facility], [address], [place], [place] [address]    Special Requests   Final    BOTTLES DRAWN AEROBIC AND ANAEROBIC Blood Culture adequate volume Performed at [facility], [address] [place], [name] [address]    Culture  Setup Time   Final    IN BOTH AEROBIC AND ANAEROBIC BOTTLES GRAM NEGATIVE RODS CRITICAL VALUE NOTED.  VALUE IS CONSISTENT WITH PREVIOUSLY REPORTED AND CALLED VALUE. Performed at [facility], [address] [place], [place] [address]    Culture (A)  Final    PROTEUS MIRABILIS SUSCEPTIBILITIES PERFORMED ON PREVIOUS CULTURE WITHIN THE LAST 5 DAYS. Performed at [facility], [place] [address], [place], [place] [address]    Report Status 07/08/2019 FINAL  Final  Blood Culture (routine x 2)     Status: Abnormal   Collection Time: 07/05/19  2:57 PM   Specimen: BLOOD  Result Value Ref Range Status   Specimen Description   Final    BLOOD LEFT ANTECUBITAL Performed at [facility], [address] [place], [place] [address]    Special Requests   Final    BOTTLES DRAWN AEROBIC AND ANAEROBIC Blood Culture adequate  volume Performed at [facility], [address] [place], [place] [address]    Culture  Setup Time   Final    ANAEROBIC BOTTLE ONLY GRAM NEGATIVE RODS CRITICAL RESULT CALLED TO, READ BACK BY AND VERIFIED WITH: SCOTT HALL ON 07/06/19 AT 0446 [facility] Performed at [facility], [place] [address], [place],  [address]    Culture PROTEUS MIRABILIS (A)  Final   Report Status 07/08/2019 FINAL  Final   Organism ID, Bacteria PROTEUS MIRABILIS  Final      Susceptibility   Proteus mirabilis - MIC*    AMPICILLIN >=32 RESISTANT Resistant     CEFAZOLIN >=64 RESISTANT Resistant     CEFEPIME 8 INTERMEDIATE Intermediate     CEFTAZIDIME RESISTANT Resistant     CEFTRIAXONE 32 RESISTANT Resistant     CIPROFLOXACIN >=4 RESISTANT Resistant     GENTAMICIN <=1 SENSITIVE Sensitive     IMIPENEM 2 SENSITIVE Sensitive     TRIMETH/SULFA >=320 RESISTANT Resistant     AMPICILLIN/SULBACTAM >=32 RESISTANT Resistant     PIP/TAZO <=4 SENSITIVE Sensitive     * PROTEUS MIRABILIS  Blood Culture ID Panel (Reflexed)     Status: Abnormal   Collection Time: 07/05/19  2:57 PM  Result Value Ref Range Status   Enterococcus species NOT DETECTED NOT DETECTED Final   Listeria monocytogenes NOT DETECTED NOT DETECTED Final   Staphylococcus species NOT DETECTED NOT DETECTED Final   Staphylococcus aureus (BCID) NOT DETECTED NOT DETECTED Final   Streptococcus species NOT DETECTED NOT DETECTED Final   Streptococcus agalactiae NOT DETECTED NOT DETECTED Final   Streptococcus pneumoniae NOT DETECTED NOT DETECTED Final   Streptococcus pyogenes NOT DETECTED NOT DETECTED Final   Acinetobacter baumannii NOT DETECTED NOT DETECTED Final   Enterobacteriaceae species DETECTED (A) NOT DETECTED Final    Comment: Enterobacteriaceae represent a large family of gram-negative bacteria, not a single organism. CRITICAL RESULT CALLED TO, READ BACK BY AND VERIFIED WITH: SCOTT HALL ON 07/06/19 AT 0446 [facility]    Enterobacter cloacae complex  NOT DETECTED NOT DETECTED Final  Escherichia coli NOT DETECTED NOT DETECTED Final   Klebsiella oxytoca NOT DETECTED NOT DETECTED Final   Klebsiella pneumoniae NOT DETECTED NOT DETECTED Final   Proteus species DETECTED (A) NOT DETECTED Final    Comment: CRITICAL RESULT CALLED TO, READ BACK BY AND VERIFIED WITH: SCOTT HALL ON 07/06/19 AT 0[address]    Serratia marcescens NOT DETECTED NOT DETECTED Final   Carbapenem resistance NOT DETECTED NOT DETECTED Final   Haemophilus influenzae NOT DETECTED NOT DETECTED Final   Neisseria meningitidis NOT DETECTED NOT DETECTED Final   Pseudomonas aeruginosa NOT DETECTED NOT DETECTED Final   Candida albicans NOT DETECTED NOT DETECTED Final   Candida glabrata NOT DETECTED NOT DETECTED Final   Candida krusei NOT DETECTED NOT DETECTED Final   Candida parapsilosis NOT DETECTED NOT DETECTED Final   Candida tropicalis NOT DETECTED NOT DETECTED Final    Comment: Performed at [facility], [address] [place], [place] [address]  Urine culture     Status: Abnormal   Collection Time: 07/05/19  4:32 PM   Specimen: Urine, Random  Result Value Ref Range Status   Specimen Description   Final    URINE, RANDOM Performed at [facility], [address] [place], [place] [address]    Special Requests   Final    NONE Performed at [facility], [address] [place], [place] [address]    Culture >=100,000 COLONIES/mL PROTEUS MIRABILIS (A)  Final   Report Status 07/07/2019 FINAL  Final   Organism ID, Bacteria PROTEUS MIRABILIS (A)  Final      Susceptibility   Proteus mirabilis - MIC*    AMPICILLIN >=32 RESISTANT Resistant     CEFAZOLIN >=64 RESISTANT Resistant     CEFTRIAXONE 32 RESISTANT Resistant     CIPROFLOXACIN >=4 RESISTANT Resistant     GENTAMICIN <=1 SENSITIVE Sensitive     IMIPENEM 4 SENSITIVE Sensitive     NITROFURANTOIN 128 RESISTANT Resistant     TRIMETH/SULFA >=320 RESISTANT Resistant     AMPICILLIN/SULBACTAM 16  INTERMEDIATE Intermediate     PIP/TAZO <=4 SENSITIVE Sensitive     * >=100,000 COLONIES/mL PROTEUS MIRABILIS  Respiratory Panel by RT PCR (Flu A&B, Covid) - Nasopharyngeal Swab     Status: None   Collection Time: 07/05/19  4:40 PM   Specimen: Nasopharyngeal Swab  Result Value Ref Range Status   SARS Coronavirus 2 by RT PCR NEGATIVE NEGATIVE Final    Comment: (NOTE) SARS-CoV-2 target nucleic acids are NOT DETECTED. The SARS-CoV-2 RNA is generally detectable in upper respiratoy specimens during the acute phase of infection. The lowest concentration of SARS-CoV-2 viral copies this assay can detect is 131 copies/mL. A negative result does not preclude SARS-Cov-2 infection and should not be used as the sole basis for treatment or other patient management decisions. A negative result may occur with  improper specimen collection/handling, submission of specimen other than nasopharyngeal swab, presence of viral mutation(s) within the areas targeted by this assay, and inadequate number of viral copies (<131 copies/mL). A negative result must be combined with clinical observations, patient history, and epidemiological information. The expected result is Negative. Fact Sheet for Patients:  PinkCheek.be Fact Sheet for Healthcare Providers:  GravelBags.it This test is not yet ap proved or cleared by the [place] FDA and  has been authorized for detection and/or diagnosis of SARS-CoV-2 by FDA under an Emergency Use Authorization (EUA). This EUA will remain  in effect (meaning this test can be used) for the duration of  the COVID-19 declaration under Section 564(b)(1) of the Act, 21 U.S.C. section 360bbb-3(b)(1), unless the authorization is terminated or revoked sooner.    Influenza A by PCR NEGATIVE NEGATIVE Final   Influenza B by PCR NEGATIVE NEGATIVE Final    Comment: (NOTE) The Xpert Xpress SARS-CoV-2/FLU/RSV assay is intended as  an aid in  the diagnosis of influenza from Nasopharyngeal swab specimens and  should not be used as a sole basis for treatment. Nasal washings and  aspirates are unacceptable for Xpert Xpress SARS-CoV-2/FLU/RSV  testing. Fact Sheet for Patients: PinkCheek.be Fact Sheet for Healthcare Providers: GravelBags.it This test is not yet approved or cleared by the [place] FDA and  has been authorized for detection and/or diagnosis of SARS-CoV-2 by  FDA under an Emergency Use Authorization (EUA). This EUA will remain  in effect (meaning this test can be used) for the duration of the  Covid-19 declaration under Section 564(b)(1) of the Act, 21  U.S.C. section 360bbb-3(b)(1), unless the authorization is  terminated or revoked. Performed at [facility], [address] [place], [place] [address]   MRSA PCR Screening     Status: None   Collection Time: 07/05/19  6:12 PM   Specimen: Nasopharyngeal  Result Value Ref Range Status   MRSA by PCR NEGATIVE NEGATIVE Final    Comment:        The GeneXpert MRSA Assay (FDA approved for NASAL specimens only), is one component of a comprehensive MRSA colonization surveillance program. It is not intended to diagnose MRSA infection nor to guide or monitor treatment for MRSA infections. Performed at [facility], [address] [place], [place] [address]   Urine Culture     Status: Abnormal   Collection Time: 07/05/19 11:25 PM   Specimen: PATH Other; Urine  Result Value Ref Range Status   Specimen Description   Final    CYSTOSCOPY URINE, RANDOM RIGHT RENAL PELVIS Performed at [facility], [address] [place], [place] [address]    Special Requests   Final    NONE Performed at [facility], [place], [place] [address]    Culture 80,000 COLONIES/mL PROTEUS MIRABILIS (A)  Final   Report Status 07/08/2019 FINAL  Final   Organism  ID, Bacteria PROTEUS MIRABILIS (A)  Final      Susceptibility   Proteus mirabilis - MIC*    AMPICILLIN >=32 RESISTANT Resistant     CEFAZOLIN >=64 RESISTANT Resistant     CEFEPIME 8 INTERMEDIATE Intermediate     CEFTAZIDIME RESISTANT Resistant     CEFTRIAXONE 32 RESISTANT Resistant     CIPROFLOXACIN >=4 RESISTANT Resistant     GENTAMICIN <=1 SENSITIVE Sensitive     IMIPENEM 2 SENSITIVE Sensitive     TRIMETH/SULFA >=320 RESISTANT Resistant     AMPICILLIN/SULBACTAM >=32 RESISTANT Resistant     PIP/TAZO <=4 SENSITIVE Sensitive     * 80,000 COLONIES/mL PROTEUS MIRABILIS  Culture, respiratory (non-expectorated)     Status: None   Collection Time: 07/06/19  3:01 AM   Specimen: Tracheal Aspirate; Respiratory  Result Value Ref Range Status   Specimen Description   Final    TRACHEAL ASPIRATE Performed at [facility], [address], [place], [place] [address]    Special Requests   Final    Normal Performed at [facility], [address] [place], [place] [address]    Gram Stain   Final    FEW WBC PRESENT, PREDOMINANTLY PMN NO ORGANISMS SEEN Performed  at [facility], [place] [address] [place], [place] [address]    Culture RARE KLEBSIELLA PNEUMONIAE  Final   Report Status 07/08/2019 FINAL  Final   Organism ID, Bacteria KLEBSIELLA PNEUMONIAE  Final      Susceptibility   Klebsiella pneumoniae - MIC*    AMPICILLIN >=32 RESISTANT Resistant     CEFAZOLIN <=4 SENSITIVE Sensitive     CEFEPIME <=0.12 SENSITIVE Sensitive     CEFTAZIDIME <=1 SENSITIVE Sensitive     CEFTRIAXONE <=0.25 SENSITIVE Sensitive     CIPROFLOXACIN <=0.25 SENSITIVE Sensitive     GENTAMICIN <=1 SENSITIVE Sensitive     IMIPENEM <=0.25 SENSITIVE Sensitive     TRIMETH/SULFA <=20 SENSITIVE Sensitive     AMPICILLIN/SULBACTAM 8 SENSITIVE Sensitive     PIP/TAZO <=4 SENSITIVE Sensitive     * RARE KLEBSIELLA PNEUMONIAE  CULTURE, BLOOD (ROUTINE X 2) w Reflex to ID Panel     Status: None (Preliminary  result)   Collection Time: 07/11/19 12:47 PM   Specimen: BLOOD  Result Value Ref Range Status   Specimen Description BLOOD BLOOD RIGHT HAND  Final   Special Requests   Final    BOTTLES DRAWN AEROBIC AND ANAEROBIC Blood Culture adequate volume   Culture   Final    NO GROWTH < 24 HOURS Performed at [facility], [address], [place], [place] [address]    Report Status PENDING  Incomplete  CULTURE, BLOOD (ROUTINE X 2) w Reflex to ID Panel     Status: None (Preliminary result)   Collection Time: 07/11/19  1:58 PM   Specimen: BLOOD  Result Value Ref Range Status   Specimen Description BLOOD BLOOD RIGHT HAND  Final   Special Requests   Final    BOTTLES DRAWN AEROBIC AND ANAEROBIC Blood Culture adequate volume   Culture   Final    NO GROWTH < 24 HOURS Performed at [facility], [address] [place], [place] [address]    Report Status PENDING  Incomplete  SARS CORONAVIRUS 2 (TAT 6-24 HRS) Nasopharyngeal Nasopharyngeal Swab     Status: None   Collection Time: 07/11/19  2:00 PM   Specimen: Nasopharyngeal Swab  Result Value Ref Range Status   SARS Coronavirus 2 NEGATIVE NEGATIVE Final    Comment: (NOTE) SARS-CoV-2 target nucleic acids are NOT DETECTED. The SARS-CoV-2 RNA is generally detectable in upper and lower respiratory specimens during the acute phase of infection. Negative results do not preclude SARS-CoV-2 infection, do not rule out co-infections with other pathogens, and should not be used as the sole basis for treatment or other patient management decisions. Negative results must be combined with clinical observations, patient history, and epidemiological information. The expected result is Negative. Fact Sheet for Patients: SugarRoll.be Fact Sheet for Healthcare Providers: https://www.woods-mathews.com/ This test is not yet approved or cleared by the [place] FDA and  has been authorized for detection  and/or diagnosis of SARS-CoV-2 by FDA under an Emergency Use Authorization (EUA). This EUA will remain  in effect (meaning this test can be used) for the duration of the COVID-19 declaration under Section 56 4(b)(1) of the Act, 21 U.S.C. section 360bbb-3(b)(1), unless the authorization is terminated or revoked sooner. Performed at [facility], [place] [address], [place], [place] [address]     RADIOLOGY:  [place] EKG SITE RITE  Result Date: 07/11/2019 If Site Rite image not attached, placement could not be confirmed due to current cardiac rhythm.    CODE STATUS:  Code Status Orders  (From admission, onward)         Start     Ordered   07/05/19 1635  Full code  Continuous     07/05/19 1641        Code Status History    This patient has a current code status but no historical code status.   Advance Care Planning Activity       TOTAL TIME TAKING CARE OF THIS PATIENT: **40* minutes.    Fritzi Mandes M.D  [facility]    CC: Primary care physician; Laurell Josephs, MD

## 2019-07-12 NOTE — TOC Transition Note (Signed)
Transition of Care [facility]) - CM/SW Discharge Note   Patient Details  Name: LAMARCUS SPIRA MRN: [id number] Date of Birth: 1950-05-09  Transition of Care [facility]) CM/SW Contact:  Shelbie Hutching, RN Phone Number: 07/12/2019, 9:23 AM   Clinical Narrative:    Patient is medically cleared for discharge to [facility] today.  Patient is going to room 604, bedside RN will call report to [phone], once report is called RNCM will arrange transport via [facility].  Girlfriend [name] is aware of [facility]     Final next level of care: Skilled Nursing Facility Barriers to Discharge: Barriers Resolved   Patient Goals and CMS Choice Patient states their goals for this hospitalization and ongoing recovery are:: to get better and go home- agreable to short term rehab so he can get strong enough to go home CMS Medicare.gov Compare Post Acute Care list provided to:: Patient Choice offered to / list presented to : Patient  Discharge Placement   Existing PASRR number confirmed : 07/11/19          Patient chooses bed at: [facility] [place] Patient to be transferred to facility by: [facility] Name of family member notified: Melisa - significant other Patient and family notified of of transfer: 07/12/19  Discharge Plan and Services   Discharge Planning Services: CM Consult Post Acute Care Choice: [place]                               Social Determinants of Health (SDOH) Interventions     Readmission Risk Interventions No flowsheet data found.

## 2019-07-12 NOTE — Plan of Care (Signed)
Discharged to skilled nursing 

## 2019-07-12 NOTE — Progress Notes (Signed)
Report called to skilled nursing facility. Peripheral IV removed, PICC line to right upper extremity intact. Discharge education provided to both patient and significant other. Both verbalized understanding of education provided to include IV antibiotics, physical therapy and following up with Urology regarding kidney stones for scheduled appointment. No c/o pain at this time. Medicated with 1 tab of percocet this am with scheduled 1000 meds. Vitals stable. HR regular.

## 2019-07-15 ENCOUNTER — Other Ambulatory Visit: Payer: Self-pay | Admitting: *Deleted

## 2019-07-15 ENCOUNTER — Telehealth: Payer: Self-pay

## 2019-07-15 DIAGNOSIS — N201 Calculus of ureter: Secondary | ICD-10-CM

## 2019-07-15 NOTE — Telephone Encounter (Signed)
Pt's nurse calls from [facility], she states that they are trying to limit pt's coming and going as much as possible. She questions of the pt can have urine cx and coivd testing performed at the nursing facility and results faxed to [place]. Please advise Alyse Low at [phone]

## 2019-07-15 NOTE — Telephone Encounter (Signed)
LMOM for Christy to return call.

## 2019-07-16 ENCOUNTER — Other Ambulatory Visit: Payer: Medicare HMO

## 2019-07-16 LAB — CULTURE, BLOOD (ROUTINE X 2)
Culture: NO GROWTH
Culture: NO GROWTH
Special Requests: ADEQUATE
Special Requests: ADEQUATE

## 2019-07-16 NOTE — Telephone Encounter (Signed)
Spoke with [employer]. UA & UCX were done at facility on 07/15/2019 and results will be faxed to clinic. Also notified Alyse Low of covid test at [facility] and to hold aspirin and fish oil beginning 07/16/2019.

## 2019-07-17 ENCOUNTER — Other Ambulatory Visit: Payer: Self-pay | Admitting: Radiology

## 2019-07-17 ENCOUNTER — Other Ambulatory Visit: Payer: Self-pay

## 2019-07-17 ENCOUNTER — Encounter
Admit: 2019-07-17 | Discharge: 2019-07-17 | Disposition: A | Discharge status: Home / Self Care | Payer: Medicare HMO | Attending: Urology | Admitting: Urology

## 2019-07-17 NOTE — Patient Instructions (Signed)
Your procedure is scheduled on: Tuesday July 23, 2019 Report to Day Surgery. To find out your arrival time please call [phone] between 1PM - 3PM on Monday July 19, 2019.  Remember: Instructions that are not followed completely may result in serious medical risk,  up to and including death, or upon the discretion of your surgeon and anesthesiologist your  surgery may need to be rescheduled.     _X__ 1. Do not eat food after midnight the night before your procedure.                 No gum chewing or hard candies. You may drink clear liquids up to 2 hours                 before you are scheduled to arrive for your surgery- DO not drink clear                 liquids within 2 hours of the start of your surgery.                 Clear Liquids include:  water, apple juice without pulp, clear Gatorade, G2 or                  Gatorade Zero (avoid Red/Purple/Blue), Black Coffee or Tea (Do not add                 anything to coffee or tea).  __X__2.  On the morning of surgery brush your teeth with toothpaste and water, you                may rinse your mouth with mouthwash if you wish.  Do not swallow any toothpaste of mouthwash.     _X__ 3.  No Alcohol for 24 hours before or after surgery.   _X__ 4.  Do Not Smoke or use e-cigarettes For 24 Hours Prior to Your Surgery.                 Do not use any chewable tobacco products for at least 6 hours prior to                 surgery.  __x__  6.  Notify your doctor if there is any change in your medical condition      (cold, fever, infections).     Do not wear jewelry, make-up, hairpins, clips or nail polish. Do not wear lotions, powders, or perfumes. You may wear deodorant. Do not shave 48 hours prior to surgery. Men may shave face and neck. Do not bring valuables to the hospital.    [facility] is not responsible for any belongings or valuables.  Contacts, dentures or bridgework may not be worn into surgery. Leave  your suitcase in the car. After surgery it may be brought to your room. For patients admitted to the hospital, discharge time is determined by your treatment team.   Patients discharged the day of surgery will not be allowed to drive home.   Make arrangements for someone to be with you for the first 24 hours of your Same Day Discharge.   __x__ Take these medicines the morning of surgery with A SIP OF WATER:    1. amLODipine (NORVASC)  2. levETIRAcetam (KEPPRA  3. levothyroxine (SYNTHROID, LEVOTHROID) 125 MCG  4. omeprazole (PRILOSEC) 20 MG    __x__ Use inhalers on the day of surgery VENTOLIN HFA 108 (90 Base)   __x__ Stop aspirin and Omega-3  Fatty Acids (Fish Oil) on 07/16/19 as instructed by provider   __x__ Stop Anti-inflammatories such as ibuprofen, Aleve, naproxen and or BC powders.   __x__ Stop supplements until after surgery.    __x__  Do not start any herbal supplements before your surgery.

## 2019-07-19 ENCOUNTER — Other Ambulatory Visit: Payer: Self-pay

## 2019-07-19 ENCOUNTER — Other Ambulatory Visit
Admit: 2019-07-19 | Discharge: 2019-07-19 | Disposition: A | Discharge status: Home / Self Care | Payer: Medicare HMO | Attending: Urology | Admitting: Urology

## 2019-07-19 ENCOUNTER — Other Ambulatory Visit: Payer: Self-pay | Admitting: Radiology

## 2019-07-19 DIAGNOSIS — N201 Calculus of ureter: Secondary | ICD-10-CM

## 2019-07-19 MED ORDER — FLUCONAZOLE 200 MG PO TABS: 400.0000 mg | ORAL_TABLET | Freq: Every day | ORAL | 0 refills | Status: DC

## 2019-07-20 LAB — SARS CORONAVIRUS 2 (TAT 6-24 HRS): SARS Coronavirus 2: NEGATIVE

## 2019-07-23 ENCOUNTER — Encounter
Admission: RE | Disposition: A | Discharge status: Home / Self Care | Payer: Self-pay | Source: Home / Self Care | Attending: Urology

## 2019-07-23 ENCOUNTER — Ambulatory Visit: Payer: Medicare HMO

## 2019-07-23 ENCOUNTER — Ambulatory Visit: Payer: Medicare HMO | Admitting: Anesthesiology

## 2019-07-23 ENCOUNTER — Ambulatory Visit
Admission: RE | Admit: 2019-07-23 | Discharge: 2019-07-23 | Disposition: A | Discharge status: Home / Self Care | Payer: Medicare HMO | Attending: Urology | Admitting: Urology

## 2019-07-23 ENCOUNTER — Encounter: Payer: Self-pay | Admitting: Urology

## 2019-07-23 DIAGNOSIS — G4730 Sleep apnea, unspecified: Secondary | ICD-10-CM | POA: Insufficient documentation

## 2019-07-23 DIAGNOSIS — Z8673 Personal history of transient ischemic attack (TIA), and cerebral infarction without residual deficits: Secondary | ICD-10-CM | POA: Insufficient documentation

## 2019-07-23 DIAGNOSIS — I10 Essential (primary) hypertension: Secondary | ICD-10-CM | POA: Insufficient documentation

## 2019-07-23 DIAGNOSIS — J449 Chronic obstructive pulmonary disease, unspecified: Secondary | ICD-10-CM | POA: Insufficient documentation

## 2019-07-23 DIAGNOSIS — N202 Calculus of kidney with calculus of ureter: Secondary | ICD-10-CM | POA: Diagnosis not present

## 2019-07-23 DIAGNOSIS — Z833 Family history of diabetes mellitus: Secondary | ICD-10-CM | POA: Insufficient documentation

## 2019-07-23 DIAGNOSIS — N200 Calculus of kidney: Secondary | ICD-10-CM

## 2019-07-23 DIAGNOSIS — N136 Pyonephrosis: Secondary | ICD-10-CM | POA: Diagnosis not present

## 2019-07-23 DIAGNOSIS — N201 Calculus of ureter: Secondary | ICD-10-CM

## 2019-07-23 DIAGNOSIS — Z85818 Personal history of malignant neoplasm of other sites of lip, oral cavity, and pharynx: Secondary | ICD-10-CM | POA: Diagnosis not present

## 2019-07-23 DIAGNOSIS — E119 Type 2 diabetes mellitus without complications: Secondary | ICD-10-CM | POA: Insufficient documentation

## 2019-07-23 DIAGNOSIS — Z1624 Resistance to multiple antibiotics: Secondary | ICD-10-CM | POA: Diagnosis not present

## 2019-07-23 DIAGNOSIS — F17200 Nicotine dependence, unspecified, uncomplicated: Secondary | ICD-10-CM | POA: Diagnosis not present

## 2019-07-23 HISTORY — PX: CYSTOSCOPY W/ RETROGRADES: SHX1426

## 2019-07-23 HISTORY — PX: CYSTOSCOPY/URETEROSCOPY/HOLMIUM LASER/STENT PLACEMENT: SHX6546

## 2019-07-23 LAB — GLUCOSE, CAPILLARY
Glucose-Capillary: 85 mg/dL (ref 70–99)
Glucose-Capillary: 104 mg/dL — ABNORMAL HIGH (ref 70–99)

## 2019-07-23 SURGERY — CYSTOSCOPY/URETEROSCOPY/HOLMIUM LASER/STENT PLACEMENT
Anesthesia: General | Site: Ureter | Laterality: Right

## 2019-07-23 MED ORDER — PROPOFOL 10 MG/ML IV BOLUS
INTRAVENOUS | Status: AC
  Filled 2019-07-23: qty 20

## 2019-07-23 MED ORDER — LIDOCAINE HCL (CARDIAC) PF 100 MG/5ML IV SOSY
PREFILLED_SYRINGE | INTRAVENOUS | Status: DC | PRN
  Administered 2019-07-23: 100 mg via INTRAVENOUS

## 2019-07-23 MED ORDER — PIPERACILLIN-TAZOBACTAM 3.375 G IVPB 30 MIN
3.3750 g | Freq: Once | INTRAVENOUS | Status: DC
  Filled 2019-07-23: qty 50

## 2019-07-23 MED ORDER — FENTANYL CITRATE (PF) 100 MCG/2ML IJ SOLN
INTRAMUSCULAR | Status: DC | PRN
  Administered 2019-07-23 (×2): 50 ug via INTRAVENOUS

## 2019-07-23 MED ORDER — ONDANSETRON HCL 4 MG/2ML IJ SOLN
INTRAMUSCULAR | Status: DC | PRN
  Administered 2019-07-23: 4 mg via INTRAVENOUS

## 2019-07-23 MED ORDER — PIPERACILLIN-TAZOBACTAM 3.375 G IVPB
INTRAVENOUS | Status: AC
  Filled 2019-07-23: qty 50

## 2019-07-23 MED ORDER — IOHEXOL 180 MG/ML  SOLN
INTRAMUSCULAR | Status: DC | PRN
  Administered 2019-07-23: 20 mL

## 2019-07-23 MED ORDER — FENTANYL CITRATE (PF) 100 MCG/2ML IJ SOLN: 25.0000 ug | INTRAMUSCULAR | Status: DC | PRN

## 2019-07-23 MED ORDER — ONDANSETRON HCL 4 MG/2ML IJ SOLN
INTRAMUSCULAR | Status: AC
  Filled 2019-07-23: qty 2

## 2019-07-23 MED ORDER — SUGAMMADEX SODIUM 500 MG/5ML IV SOLN
INTRAVENOUS | Status: DC | PRN
  Administered 2019-07-23: 250 mg via INTRAVENOUS

## 2019-07-23 MED ORDER — ACETAMINOPHEN 10 MG/ML IV SOLN
INTRAVENOUS | Status: DC | PRN
  Administered 2019-07-23: 1000 mg via INTRAVENOUS

## 2019-07-23 MED ORDER — LACTATED RINGERS IV SOLN: INTRAVENOUS | Status: DC

## 2019-07-23 MED ORDER — ONDANSETRON HCL 4 MG/2ML IJ SOLN: 4.0000 mg | Freq: Once | INTRAMUSCULAR | Status: DC | PRN

## 2019-07-23 MED ORDER — ROCURONIUM BROMIDE 10 MG/ML (PF) SYRINGE
PREFILLED_SYRINGE | INTRAVENOUS | Status: AC
  Filled 2019-07-23: qty 10

## 2019-07-23 MED ORDER — ROCURONIUM BROMIDE 100 MG/10ML IV SOLN
INTRAVENOUS | Status: DC | PRN
  Administered 2019-07-23: 50 mg via INTRAVENOUS
  Administered 2019-07-23: 20 mg via INTRAVENOUS

## 2019-07-23 MED ORDER — SUGAMMADEX SODIUM 500 MG/5ML IV SOLN
INTRAVENOUS | Status: AC
  Filled 2019-07-23: qty 5

## 2019-07-23 MED ORDER — ACETAMINOPHEN 10 MG/ML IV SOLN
INTRAVENOUS | Status: AC
  Filled 2019-07-23: qty 100

## 2019-07-23 MED ORDER — FENTANYL CITRATE (PF) 100 MCG/2ML IJ SOLN
INTRAMUSCULAR | Status: AC
  Filled 2019-07-23: qty 2

## 2019-07-23 MED ORDER — PROPOFOL 10 MG/ML IV BOLUS
INTRAVENOUS | Status: DC | PRN
  Administered 2019-07-23: 150 mg via INTRAVENOUS
  Administered 2019-07-23: 50 mg via INTRAVENOUS

## 2019-07-23 MED ORDER — PIPERACILLIN-TAZOBACTAM 3.375 G IVPB 30 MIN
3.3750 g | Freq: Once | INTRAVENOUS | Status: AC
  Administered 2019-07-23: 3.375 g via INTRAVENOUS
  Filled 2019-07-23: qty 50

## 2019-07-23 MED ORDER — EPHEDRINE 5 MG/ML INJ
INTRAVENOUS | Status: AC
  Filled 2019-07-23: qty 10

## 2019-07-23 MED ORDER — DEXAMETHASONE SODIUM PHOSPHATE 10 MG/ML IJ SOLN
INTRAMUSCULAR | Status: DC | PRN
  Administered 2019-07-23: 5 mg via INTRAVENOUS

## 2019-07-23 MED ORDER — EPHEDRINE SULFATE 50 MG/ML IJ SOLN
INTRAMUSCULAR | Status: DC | PRN
  Administered 2019-07-23 (×3): 10 mg via INTRAVENOUS

## 2019-07-23 MED ORDER — DEXAMETHASONE SODIUM PHOSPHATE 10 MG/ML IJ SOLN
INTRAMUSCULAR | Status: AC
  Filled 2019-07-23: qty 1

## 2019-07-23 SURGICAL SUPPLY — 29 items
BAG DRAIN CYSTO-URO LG1000N (MISCELLANEOUS) ×2 IMPLANT
BASKET ZERO TIP 1.9FR (BASKET) ×1 IMPLANT
BRUSH SCRUB EZ 1% IODOPHOR (MISCELLANEOUS) ×2 IMPLANT
CATH URETL 5X70 OPEN END (CATHETERS) IMPLANT
CNTNR SPEC 2.5X3XGRAD LEK (MISCELLANEOUS)
CONT SPEC 4OZ STER OR WHT (MISCELLANEOUS)
CONTAINER SPEC 2.5X3XGRAD LEK (MISCELLANEOUS) IMPLANT
DRAPE UTILITY 15X26 TOWEL STRL (DRAPES) ×2 IMPLANT
FIBER LASER TRACTIP 200 (UROLOGICAL SUPPLIES) ×3 IMPLANT
GLOVE BIO SURGEON STRL SZ8 (GLOVE) ×2 IMPLANT
GOWN STRL REUS W/ TWL LRG LVL3 (GOWN DISPOSABLE) ×1 IMPLANT
GOWN STRL REUS W/ TWL XL LVL3 (GOWN DISPOSABLE) ×1 IMPLANT
GOWN STRL REUS W/TWL LRG LVL3 (GOWN DISPOSABLE) ×1
GOWN STRL REUS W/TWL XL LVL3 (GOWN DISPOSABLE) ×1
GUIDEWIRE STR DUAL SENSOR (WIRE) ×3 IMPLANT
INFUSOR MANOMETER BAG 3000ML (MISCELLANEOUS) ×2 IMPLANT
INTRODUCER DILATOR DOUBLE (INTRODUCER) ×1 IMPLANT
KIT TURNOVER CYSTO (KITS) ×2 IMPLANT
PACK CYSTO AR (MISCELLANEOUS) ×2 IMPLANT
SET CYSTO W/LG BORE CLAMP LF (SET/KITS/TRAYS/PACK) ×2 IMPLANT
SHEATH URETERAL 12FR 45CM (SHEATH) ×1 IMPLANT
SHEATH URETERAL 12FRX35CM (MISCELLANEOUS) ×1 IMPLANT
SOL .9 NS 3000ML IRR  AL (IV SOLUTION) ×1
SOL .9 NS 3000ML IRR UROMATIC (IV SOLUTION) ×1 IMPLANT
STENT URET 6FRX24 CONTOUR (STENTS) IMPLANT
STENT URET 6FRX26 CONTOUR (STENTS) ×1 IMPLANT
SURGILUBE 2OZ TUBE FLIPTOP (MISCELLANEOUS) ×2 IMPLANT
VALVE UROSEAL ADJ ENDO (VALVE) ×1 IMPLANT
WATER STERILE IRR 1000ML POUR (IV SOLUTION) ×2 IMPLANT

## 2019-07-23 NOTE — Anesthesia Procedure Notes (Signed)
Procedure Name: Intubation Date/Time: 07/23/2019 12:18 PM Performed by: Aline Brochure, CRNA Pre-anesthesia Checklist: Patient identified, Emergency Drugs available, Suction available and Patient being monitored Patient Re-evaluated:Patient Re-evaluated prior to induction Oxygen Delivery Method: Circle system utilized Preoxygenation: Pre-oxygenation with 100% oxygen Induction Type: IV induction Ventilation: Mask ventilation without difficulty Laryngoscope Size: McGraph and 4 Grade View: Grade I Tube type: Oral Tube size: 7.5 mm Number of attempts: 1 Airway Equipment and Method: Stylet and Video-laryngoscopy Placement Confirmation: ETT inserted through vocal cords under direct vision,  positive ETCO2 and breath sounds checked- equal and bilateral Secured at: 22 cm Tube secured with: Tape Dental Injury: Teeth and Oropharynx as per pre-operative assessment

## 2019-07-23 NOTE — Anesthesia Preprocedure Evaluation (Signed)
Anesthesia Evaluation  Patient identified by MRN, date of birth, ID band Patient awake    Reviewed: Allergy & Precautions, NPO status , Patient's Chart, lab work & pertinent test results  History of Anesthesia Complications Negative for: history of anesthetic complications  Airway Mallampati: II  TM Distance: >3 FB Neck ROM: Full    Dental  (+) Edentulous Lower, Edentulous Upper   Pulmonary sleep apnea (has CPAP but does not wear it) , COPD,  COPD inhaler, Current Smoker and Patient abstained from smoking.,    breath sounds clear to auscultation- rhonchi (-) wheezing      Cardiovascular hypertension, Pt. on medications (-) CAD, (-) Past MI, (-) Cardiac Stents and (-) CABG  Rhythm:Regular Rate:Normal - Systolic murmurs and - Diastolic murmurs    Neuro/Psych Seizures: one seizure in setting of septic pnemonia.  Anxiety CVA (occasional L arm numbness)    GI/Hepatic Neg liver ROS, GERD  ,  Endo/Other  diabetes, Oral Hypoglycemic AgentsHypothyroidism   Renal/GU Renal disease: nephrolithiasis.     Musculoskeletal negative musculoskeletal ROS (+)   Abdominal (+) + obese,   Peds  Hematology negative hematology ROS (+)   Anesthesia Other Findings Past Medical History: No date: Anxiety     Comment:  mild 2015: Cancer ([place])     Comment:  tonsil No date: COPD (chronic obstructive pulmonary disease) (HCC) No date: Diabetes mellitus without complication (HCC) No date: Elevated cholesterol No date: GERD (gastroesophageal reflux disease) No date: Hypertension No date: Hypothyroidism 2015: Seizures ([place])     Comment:  related to sepsis 2019: Sleep apnea     Comment:  has a Cpap machime  2007: Stroke ([place])     Comment:  no peripheral vision on left side of both eyes & left               arm gets numb under stress   Reproductive/Obstetrics                             Anesthesia Physical Anesthesia  Plan  ASA: III  Anesthesia Plan: General   Post-op Pain Management:    Induction: Intravenous  PONV Risk Score and Plan: 0 and Ondansetron  Airway Management Planned: Oral ETT  Additional Equipment:   Intra-op Plan:   Post-operative Plan: Extubation in OR  Informed Consent: I have reviewed the patients History and Physical, chart, labs and discussed the procedure including the risks, benefits and alternatives for the proposed anesthesia with the patient or authorized representative who has indicated his/her understanding and acceptance.     Dental advisory given  Plan Discussed with: CRNA and Anesthesiologist  Anesthesia Plan Comments:         Anesthesia Quick Evaluation

## 2019-07-23 NOTE — Op Note (Signed)
Preoperative diagnosis:  1.  Right ureteral calculus 2.  Right nephrolithiasis   Postoperative diagnosis:  Same  Procedure:  Cystoscopy Right ureteroscopy and stone removal (staged) Ureteroscopic laser lithotripsy Right ureteral stent exchange (6FR) 24 cm Right retrograde pyelography with interpretation  Surgeon: Nicki Reaper C. Keaten Mashek, M.D.  Anesthesia: General  Complications: None  Intraoperative findings:  1.  Right retrograde pyelography demonstrated a filling defect within the lower pole calyces consistent with the patient's known calculus.  Extravasation of contrast was noted.    EBL: Minimal  Specimens: Calculus fragments for analysis   Indication: Shawn Lindsey is a 69 y.o. year old patient admitted in early March with sepsis secondary to an obstructing 7 mm right proximal ureteral calculus.  He also had a right partial staghorn calculus.  Urine blood cultures grew multidrug-resistant Proteus.  He has a PICC line and is on IV Zosyn until 08/05/2019.  He presents for definitive treatment of his ureteral calculus and most likely staged treatment of his partial staghorn.  After reviewing the management options for treatment, the patient elected to proceed with the above surgical procedure(s). We have discussed the potential benefits and risks of the procedure, side effects of the proposed treatment, the likelihood of the patient achieving the goals of the procedure, and any potential problems that might occur during the procedure or recuperation. Informed consent has been obtained.  Description of procedure:  The patient was taken to the operating room and general anesthesia was induced.  The patient was placed in the dorsal lithotomy position, prepped and draped in the usual sterile fashion, and preoperative antibiotics were administered. A preoperative time-out was performed.   A 21 French cystoscope was lubricated and passed under direct vision.  The urethra was normal in caliber  without stricture.  The prostate demonstrated mild lateral lobe enlargement with mild bladder neck elevation.  Panendoscopy was performed and the bladder mucosa showed no solid or papillary lesions.  The ureteral stent was grasped with endoscopic forceps and brought out to the urethral meatus.  A 0.038 Sensor wire was then placed to the stent however the the wire could not be advanced into the renal pelvis.  The stent was removed and a dual-lumen catheter was placed over the wire in the region of the mid ureter.  Retrograde pyelogram was performed with findings as described above.  A second guidewire was placed through the dual-lumen catheter and easily advanced into the renal pelvis.  The guidewire that was in the mid ureter was then able to be advanced into the renal pelvis without difficulty.  A 12/14 French ureteral access sheath was placed over the working wire under fluoroscopic guidance to the level of the lower proximal ureter.  A digital flexible ureteroscope was placed through the access sheath and advanced into the ureter and the calculus was identified in the proximal ureter.  A 200 micron holmium laser fiber was placed through the ureteroscope and the stone was dusted at a setting of 0.2 J and frequency of 20 hz.  Once down to a size of <5 mm the remaining fragment was placed in a 1.9 [place] nitinol basket and removed without difficulty.  The ureteroscope was then advanced into the renal pelvis.  In the proximal ureter a small perforation was noted.  The guidewire was replaced with ureteroscope which was then removed.  The inner lumen of the access sheath was placed over this wire and the access sheath was advanced to the proximal ureter.  The partial staghorn calculus  was identified and the lower pole calyces.  It was dusted at a setting of 0.2 J / 20 Hz which was increased to 40 Hz.  Approximately 50% of the stone was treated after 2 hours of anesthesia time and visualization at this point  was suboptimal.  It was elected to proceed with a staged procedure and follow-up ureteroscopy in 1 week.  The access sheath was removed under direct visualization.  The wire was then backloaded through the cystoscope and a 6FR/26 cm Contour ureteral stent was placed without difficulty.  The bladder was then emptied and the cystoscope was removed.  The patient appeared to tolerate the procedure well and without complications.  After anesthetic reversal the patient was transported to the PACU in stable condition.   Plan: -Schedule completion of staged ureteroscopy in approximately 1 week   [name], MD

## 2019-07-23 NOTE — Transfer of Care (Signed)
Immediate Anesthesia Transfer of Care Note  Patient: Shawn Lindsey  Procedure(s) Performed: CYSTOSCOPY/URETEROSCOPY/HOLMIUM LASER/STENT Exchange (Right Ureter) CYSTOSCOPY WITH RETROGRADE PYELOGRAM (Right Ureter)  Patient Location: PACU  Anesthesia Type:General  Level of Consciousness: awake  Airway & Oxygen Therapy: Patient connected to face mask oxygen  Post-op Assessment: Post -op Vital signs reviewed and stable  Post vital signs: stable  Last Vitals:  Vitals Value Taken Time  BP 146/75   Temp    Pulse 61 07/23/19 1441  Resp 12 07/23/19 1441  SpO2 98 % 07/23/19 1441  Vitals shown include unvalidated device data.  Last Pain:  Vitals:   07/23/19 1110  TempSrc: Tympanic  PainSc: 0-No pain         Complications: No apparent anesthesia complications

## 2019-07-23 NOTE — Discharge Instructions (Signed)
AMBULATORY SURGERY  DISCHARGE INSTRUCTIONS   1) The drugs that you were given will stay in your system until tomorrow so for the next 24 hours you should not:  A) Drive an automobile B) Make any legal decisions C) Drink any alcoholic beverage   2) You may resume regular meals tomorrow.  Today it is better to start with liquids and gradually work up to solid foods.  You may eat anything you prefer, but it is better to start with liquids, then soup and crackers, and gradually work up to solid foods.   3) Please notify your doctor immediately if you have any unusual bleeding, trouble breathing, redness and pain at the surgery site, drainage, fever, or pain not relieved by medication.  4) Your post-operative visit with Dr.                                     is: Date:                        Time:    Please call to schedule your post-operative visit.  5) Additional Instructions: DISCHARGE INSTRUCTIONS FOR KIDNEY STONE/URETERAL STENT   MEDICATIONS:  1. Resume all previous medication   ACTIVITY:  1. May resume regular activities in 24 hours. 2. Drink plenty of water    SIGNS/SYMPTOMS TO CALL:  Please call us if you have a fever greater than 101.5, uncontrolled nausea/vomiting, uncontrolled pain, dizziness, unable to urinate, excessively bloody urine, chest pain, shortness of breath, leg swelling, leg pain, or any other concerns or questions.  Urinary frequency, urgency and bladder spasms are common postoperative symptoms.  You can reach [place] at [phone].   FOLLOW-UP:  1. You will be scheduled for follow-up ureteroscopy to treat the remainder of your stone in 1 week 07/30/2019

## 2019-07-23 NOTE — Interval H&P Note (Signed)
History and Physical Interval Note: 69 y.o. male who underwent stent placement early March for an obstructing stone with sepsis.  Subsequently treated for septic shock.  He had multidrug-resistant Proteus.  He has a partial right lower pole staghorn calculus and treatment of the stone was requested while he is still on IV antibiotics.  He is scheduled for ureteroscopic removal of his right ureteral calculus and laser lithotripsy of his right partial staghorn.  We discussed the procedure in detail and the possibility it may need to be a staged procedure.  Potential risks were discussed including bleeding, recurrent sepsis, injury to ureter/kidney as well as anesthetic risk.  All questions were answered and he desires to proceed.  07/23/2019 11:57 AM  [facility] Nurse  has presented today for surgery, with the diagnosis of right nephrolithiasis, right ureteral calculus.  The various methods of treatment have been discussed with the patient and family. After consideration of risks, benefits and other options for treatment, the patient has consented to  Procedure(s): CYSTOSCOPY/URETEROSCOPY/HOLMIUM LASER/STENT Exchange (Right) as a surgical intervention.  The patient's history has been reviewed, patient examined, no change in status, stable for surgery.  I have reviewed the patient's chart and labs.  Questions were answered to the patient's satisfaction.     [place]

## 2019-07-24 ENCOUNTER — Telehealth: Payer: Self-pay | Admitting: Radiology

## 2019-07-24 ENCOUNTER — Other Ambulatory Visit: Payer: Self-pay | Admitting: Radiology

## 2019-07-24 NOTE — Telephone Encounter (Signed)
Patient is on IV antibiotics at home. Does he need a repeat urine culture or perioperative antibiotics?

## 2019-07-24 NOTE — Telephone Encounter (Signed)
I would repeat the urine culture just to make sure the fungus is no longer growing

## 2019-07-24 NOTE — Telephone Encounter (Signed)
Gave surgery information to patient over the phone. Questions answered. Reports passing something after surgery yesterday that was the size of a pea. He didn't see it but girlfriend said it was brown and there was blood in the toilet. Patient reports decreased pain since passing this. Reassured patient that this was likely a small blood clot and that some bleeding is normal following surgery. Advised patient to call with increased pain, fever, nausea, vomiting. Patient verbalized understanding.

## 2019-07-25 ENCOUNTER — Encounter
Admission: RE | Admit: 2019-07-25 | Discharge: 2019-07-25 | Disposition: A | Discharge status: Home / Self Care | Payer: Medicare HMO | Source: Ambulatory Visit | Attending: Urology | Admitting: Urology

## 2019-07-25 ENCOUNTER — Other Ambulatory Visit: Payer: Medicare HMO

## 2019-07-25 ENCOUNTER — Other Ambulatory Visit: Payer: Self-pay | Admitting: Radiology

## 2019-07-25 ENCOUNTER — Other Ambulatory Visit: Payer: Self-pay

## 2019-07-25 DIAGNOSIS — N201 Calculus of ureter: Secondary | ICD-10-CM

## 2019-07-25 NOTE — Patient Instructions (Signed)
Your procedure is scheduled on: 07/30/19 Report to [place]. To find out your arrival time please call [phone] between 1PM - 3PM on 07/29/19.  Remember: Instructions that are not followed completely may result in serious medical risk, up to and including death, or upon the discretion of your surgeon and anesthesiologist your surgery may need to be rescheduled.     _X__ 1. Do not eat food after midnight the night before your procedure.                 No gum chewing or hard candies. You may drink clear liquids up to 2 hours                 before you are scheduled to arrive for your surgery- DO not drink clear                 liquids within 2 hours of the start of your surgery.                 Clear Liquids include:  water, apple juice without pulp, clear carbohydrate                 drink such as Clearfast or Gatorade, Black Coffee or Tea (Do not add                 anything to coffee or tea). Diabetics water only  __X__2.  On the morning of surgery brush your teeth with toothpaste and water, you                 may rinse your mouth with mouthwash if you wish.  Do not swallow any              toothpaste of mouthwash.     _X__ 3.  No Alcohol for 24 hours before or after surgery.   _X__ 4.  Do Not Smoke or use e-cigarettes For 24 Hours Prior to Your Surgery.                 Do not use any chewable tobacco products for at least 6 hours prior to                 surgery.  ____  5.  Bring all medications with you on the day of surgery if instructed.   __X__  6.  Notify your doctor if there is any change in your medical condition      (cold, fever, infections).     Do not wear jewelry, make-up, hairpins, clips or nail polish. Do not wear lotions, powders, or perfumes.  Do not shave 48 hours prior to surgery. Men may shave face and neck. Do not bring valuables to the hospital.    [facility] is not responsible for any belongings or  valuables.  Contacts, dentures/partials or body piercings may not be worn into surgery. Bring a case for your contacts, glasses or hearing aids, a denture cup will be supplied. Leave your suitcase in the car. After surgery it may be brought to your room. For patients admitted to the hospital, discharge time is determined by your treatment team.   Patients discharged the day of surgery will not be allowed to drive home.   Please read over the following fact sheets that you were given:   MRSA Information  __X__ Take these medicines the morning of surgery with A SIP OF WATER:  1. amLODipine (NORVASC) 5 MG tablet  2. FLUoxetine (PROZAC) 10 MG capsule  3. levETIRAcetam (KEPPRA) 500 MG tablet  4. levothyroxine (SYNTHROID, LEVOTHROID) 125 MCG tablet  5. magnesium oxide (MAG-OX) 400 MG tablet  6. omeprazole (PRILOSEC) 20 MG capsule  ____ Fleet Enema (as directed)   __X__ Use CHG Soap/SAGE wipes as directed  __X__ Use inhalers on the day of surgery  ____ Stop metformin/Janumet/Farxiga 2 days prior to surgery    ____ Take 1/2 of usual insulin dose the night before surgery. No insulin the morning          of surgery.   ____ Stop Blood Thinners Coumadin/Plavix/Xarelto/Pleta/Pradaxa/Eliquis/Effient/Aspirin  on   Or contact your Surgeon, Cardiologist or Medical Doctor regarding  ability to stop your blood thinners  __X__ Stop Anti-inflammatories 7 days before surgery such as Advil, Ibuprofen, Motrin,  BC or Goodies Powder, Naprosyn, Naproxen, Aleve, Aspirin    __X__ Stop all herbal supplements, fish oil or vitamin E until after surgery.    ____ Bring C-Pap to the hospital.     DO NOT WEAR YOUR NICOTINE PATCH THE DAY OF YOUR SURGERY. YOU CAN PUT ONE ON ONCE YOU'VE ARRIVED BACK HOME

## 2019-07-25 NOTE — Anesthesia Postprocedure Evaluation (Signed)
Anesthesia Post Note  Patient: Shawn Lindsey  Procedure(s) Performed: CYSTOSCOPY/URETEROSCOPY/HOLMIUM LASER/STENT Exchange (Right Ureter) CYSTOSCOPY WITH RETROGRADE PYELOGRAM (Right Ureter)  Patient location during evaluation: PACU Anesthesia Type: General Level of consciousness: awake and alert Pain management: pain level controlled Vital Signs Assessment: post-procedure vital signs reviewed and stable Respiratory status: spontaneous breathing, nonlabored ventilation, respiratory function stable and patient connected to nasal cannula oxygen Cardiovascular status: blood pressure returned to baseline and stable Postop Assessment: no apparent nausea or vomiting Anesthetic complications: no     Last Vitals:  Vitals:   07/23/19 1524 07/23/19 1549  BP: 134/73 (!) 145/72  Pulse: 65 69  Resp: 14 16  Temp: (!) 36 C   SpO2: 98% 100%    Last Pain:  Vitals:   07/23/19 1549  TempSrc:   PainSc: 0-No pain                 Molli Barrows

## 2019-07-26 ENCOUNTER — Other Ambulatory Visit
Admission: RE | Admit: 2019-07-26 | Discharge: 2019-07-26 | Disposition: A | Discharge status: Home / Self Care | Payer: Medicare HMO | Source: Ambulatory Visit | Attending: Urology | Admitting: Urology

## 2019-07-26 ENCOUNTER — Other Ambulatory Visit: Payer: Medicare HMO

## 2019-07-26 DIAGNOSIS — Z01812 Encounter for preprocedural laboratory examination: Secondary | ICD-10-CM | POA: Diagnosis present

## 2019-07-26 DIAGNOSIS — Z20822 Contact with and (suspected) exposure to covid-19: Secondary | ICD-10-CM | POA: Insufficient documentation

## 2019-07-26 LAB — CBC
HCT: 44.2 % (ref 39.0–52.0)
Hemoglobin: 14.8 g/dL (ref 13.0–17.0)
MCH: 30.6 pg (ref 26.0–34.0)
MCHC: 33.5 g/dL (ref 30.0–36.0)
MCV: 91.3 fL (ref 80.0–100.0)
Platelets: 263 10*3/uL (ref 150–400)
RBC: 4.84 MIL/uL (ref 4.22–5.81)
RDW: 15.7 % — ABNORMAL HIGH (ref 11.5–15.5)
WBC: 10.5 10*3/uL (ref 4.0–10.5)
nRBC: 0 % (ref 0.0–0.2)

## 2019-07-26 LAB — SARS CORONAVIRUS 2 (TAT 6-24 HRS): SARS Coronavirus 2: NEGATIVE

## 2019-07-29 LAB — CALCULI, WITH PHOTOGRAPH (CLINICAL LAB)
Carbonate Apatite: 30 %
Mg NH4 PO4 (Struvite): 70 %
Weight Calculi: 25 mg

## 2019-07-30 ENCOUNTER — Ambulatory Visit: Payer: Medicare HMO | Admitting: Anesthesiology

## 2019-07-30 ENCOUNTER — Encounter: Payer: Self-pay | Admitting: Urology

## 2019-07-30 ENCOUNTER — Other Ambulatory Visit: Payer: Self-pay

## 2019-07-30 ENCOUNTER — Encounter
Admission: RE | Disposition: A | Discharge status: Home / Self Care | Payer: Self-pay | Source: Home / Self Care | Attending: Urology

## 2019-07-30 ENCOUNTER — Ambulatory Visit
Admission: RE | Admit: 2019-07-30 | Discharge: 2019-07-30 | Disposition: A | Discharge status: Home / Self Care | Payer: Medicare HMO | Attending: Urology | Admitting: Urology

## 2019-07-30 ENCOUNTER — Ambulatory Visit: Payer: Medicare HMO

## 2019-07-30 DIAGNOSIS — N200 Calculus of kidney: Secondary | ICD-10-CM

## 2019-07-30 DIAGNOSIS — N132 Hydronephrosis with renal and ureteral calculous obstruction: Secondary | ICD-10-CM | POA: Insufficient documentation

## 2019-07-30 DIAGNOSIS — Z8589 Personal history of malignant neoplasm of other organs and systems: Secondary | ICD-10-CM | POA: Diagnosis not present

## 2019-07-30 DIAGNOSIS — F17210 Nicotine dependence, cigarettes, uncomplicated: Secondary | ICD-10-CM | POA: Diagnosis not present

## 2019-07-30 DIAGNOSIS — E119 Type 2 diabetes mellitus without complications: Secondary | ICD-10-CM | POA: Insufficient documentation

## 2019-07-30 DIAGNOSIS — J449 Chronic obstructive pulmonary disease, unspecified: Secondary | ICD-10-CM | POA: Insufficient documentation

## 2019-07-30 DIAGNOSIS — H538 Other visual disturbances: Secondary | ICD-10-CM | POA: Insufficient documentation

## 2019-07-30 DIAGNOSIS — I69398 Other sequelae of cerebral infarction: Secondary | ICD-10-CM | POA: Insufficient documentation

## 2019-07-30 HISTORY — PX: CYSTOSCOPY/URETEROSCOPY/HOLMIUM LASER/STENT PLACEMENT: SHX6546

## 2019-07-30 LAB — CULTURE, URINE COMPREHENSIVE

## 2019-07-30 LAB — GLUCOSE, CAPILLARY
Glucose-Capillary: 72 mg/dL (ref 70–99)
Glucose-Capillary: 74 mg/dL (ref 70–99)
Glucose-Capillary: 101 mg/dL — ABNORMAL HIGH (ref 70–99)

## 2019-07-30 SURGERY — CYSTOSCOPY/URETEROSCOPY/HOLMIUM LASER/STENT PLACEMENT
Anesthesia: General | Laterality: Right

## 2019-07-30 MED ORDER — SUGAMMADEX SODIUM 200 MG/2ML IV SOLN
INTRAVENOUS | Status: DC | PRN
  Administered 2019-07-30: 200 mg via INTRAVENOUS

## 2019-07-30 MED ORDER — PHENYLEPHRINE HCL (PRESSORS) 10 MG/ML IV SOLN
INTRAVENOUS | Status: DC | PRN
  Administered 2019-07-30: 200 ug via INTRAVENOUS
  Administered 2019-07-30: 100 ug via INTRAVENOUS

## 2019-07-30 MED ORDER — FENTANYL CITRATE (PF) 100 MCG/2ML IJ SOLN: 25.0000 ug | INTRAMUSCULAR | Status: DC | PRN

## 2019-07-30 MED ORDER — ROCURONIUM BROMIDE 10 MG/ML (PF) SYRINGE
PREFILLED_SYRINGE | INTRAVENOUS | Status: AC
  Filled 2019-07-30: qty 10

## 2019-07-30 MED ORDER — EPHEDRINE SULFATE 50 MG/ML IJ SOLN
INTRAMUSCULAR | Status: DC | PRN
  Administered 2019-07-30: 10 mg via INTRAVENOUS

## 2019-07-30 MED ORDER — PHENYLEPHRINE HCL-NACL 20-0.9 MG/250ML-% IV SOLN
INTRAVENOUS | Status: DC | PRN
  Administered 2019-07-30: 25 ug/min via INTRAVENOUS

## 2019-07-30 MED ORDER — PROPOFOL 10 MG/ML IV BOLUS
INTRAVENOUS | Status: DC | PRN
  Administered 2019-07-30: 150 mg via INTRAVENOUS

## 2019-07-30 MED ORDER — ONDANSETRON HCL 4 MG/2ML IJ SOLN
INTRAMUSCULAR | Status: DC | PRN
  Administered 2019-07-30: 4 mg via INTRAVENOUS

## 2019-07-30 MED ORDER — SODIUM CHLORIDE 0.9 % IV SOLN: INTRAVENOUS | Status: DC

## 2019-07-30 MED ORDER — FENTANYL CITRATE (PF) 100 MCG/2ML IJ SOLN
INTRAMUSCULAR | Status: AC
  Filled 2019-07-30: qty 2

## 2019-07-30 MED ORDER — FENTANYL CITRATE (PF) 100 MCG/2ML IJ SOLN
INTRAMUSCULAR | Status: DC | PRN
  Administered 2019-07-30: 25 ug via INTRAVENOUS
  Administered 2019-07-30: 50 ug via INTRAVENOUS
  Administered 2019-07-30: 25 ug via INTRAVENOUS

## 2019-07-30 MED ORDER — SUCCINYLCHOLINE CHLORIDE 200 MG/10ML IV SOSY
PREFILLED_SYRINGE | INTRAVENOUS | Status: AC
  Filled 2019-07-30: qty 10

## 2019-07-30 MED ORDER — OXYCODONE HCL 5 MG/5ML PO SOLN: 5.0000 mg | Freq: Once | ORAL | Status: DC | PRN

## 2019-07-30 MED ORDER — MIDAZOLAM HCL 2 MG/2ML IJ SOLN
INTRAMUSCULAR | Status: DC | PRN
  Administered 2019-07-30: 1 mg via INTRAVENOUS

## 2019-07-30 MED ORDER — SUCCINYLCHOLINE CHLORIDE 20 MG/ML IJ SOLN
INTRAMUSCULAR | Status: DC | PRN
  Administered 2019-07-30: 200 mg via INTRAVENOUS

## 2019-07-30 MED ORDER — ROCURONIUM BROMIDE 100 MG/10ML IV SOLN
INTRAVENOUS | Status: DC | PRN
  Administered 2019-07-30 (×3): 10 mg via INTRAVENOUS
  Administered 2019-07-30: 30 mg via INTRAVENOUS

## 2019-07-30 MED ORDER — GLYCOPYRROLATE 0.2 MG/ML IJ SOLN
INTRAMUSCULAR | Status: AC
  Filled 2019-07-30: qty 1

## 2019-07-30 MED ORDER — LIDOCAINE HCL (CARDIAC) PF 100 MG/5ML IV SOSY
PREFILLED_SYRINGE | INTRAVENOUS | Status: DC | PRN
  Administered 2019-07-30: 100 mg via INTRAVENOUS

## 2019-07-30 MED ORDER — DEXAMETHASONE SODIUM PHOSPHATE 10 MG/ML IJ SOLN
INTRAMUSCULAR | Status: AC
  Filled 2019-07-30: qty 1

## 2019-07-30 MED ORDER — OXYCODONE HCL 5 MG PO TABS: 5.0000 mg | ORAL_TABLET | Freq: Once | ORAL | Status: DC | PRN

## 2019-07-30 MED ORDER — DEXAMETHASONE SODIUM PHOSPHATE 10 MG/ML IJ SOLN
INTRAMUSCULAR | Status: DC | PRN
  Administered 2019-07-30: 5 mg via INTRAVENOUS

## 2019-07-30 MED ORDER — LIDOCAINE HCL (PF) 2 % IJ SOLN
INTRAMUSCULAR | Status: AC
  Filled 2019-07-30: qty 10

## 2019-07-30 MED ORDER — MIDAZOLAM HCL 2 MG/2ML IJ SOLN
INTRAMUSCULAR | Status: AC
  Filled 2019-07-30: qty 2

## 2019-07-30 MED ORDER — PIPERACILLIN-TAZOBACTAM 3.375 G IVPB 30 MIN
3.3750 g | Freq: Once | INTRAVENOUS | Status: AC
  Administered 2019-07-30: 3.375 g via INTRAVENOUS
  Filled 2019-07-30: qty 50

## 2019-07-30 MED ORDER — IOHEXOL 180 MG/ML  SOLN
INTRAMUSCULAR | Status: DC | PRN
  Administered 2019-07-30: 20 mL

## 2019-07-30 MED ORDER — PIPERACILLIN-TAZOBACTAM 3.375 G IVPB
INTRAVENOUS | Status: AC
  Filled 2019-07-30: qty 50

## 2019-07-30 SURGICAL SUPPLY — 29 items
BAG DRAIN CYSTO-URO LG1000N (MISCELLANEOUS) ×2 IMPLANT
BASKET ZERO TIP 1.9FR (BASKET) ×2 IMPLANT
BLADE SURG 11 STRL SS SAFETY (MISCELLANEOUS) ×2 IMPLANT
BRUSH SCRUB EZ 1% IODOPHOR (MISCELLANEOUS) ×2 IMPLANT
CATH URETL 5X70 OPEN END (CATHETERS) IMPLANT
CNTNR SPEC 2.5X3XGRAD LEK (MISCELLANEOUS)
CONT SPEC 4OZ STER OR WHT (MISCELLANEOUS)
CONTAINER SPEC 2.5X3XGRAD LEK (MISCELLANEOUS) IMPLANT
DRAPE UTILITY 15X26 TOWEL STRL (DRAPES) ×2 IMPLANT
FIBER LASER TRACTIP 200 (UROLOGICAL SUPPLIES) ×2 IMPLANT
GLOVE BIO SURGEON STRL SZ8 (GLOVE) ×2 IMPLANT
GOWN STRL REUS W/ TWL LRG LVL3 (GOWN DISPOSABLE) ×1 IMPLANT
GOWN STRL REUS W/ TWL XL LVL3 (GOWN DISPOSABLE) ×1 IMPLANT
GOWN STRL REUS W/TWL LRG LVL3 (GOWN DISPOSABLE) ×1
GOWN STRL REUS W/TWL XL LVL3 (GOWN DISPOSABLE) ×1
GUIDEWIRE STR DUAL SENSOR (WIRE) ×4 IMPLANT
INFUSOR MANOMETER BAG 3000ML (MISCELLANEOUS) ×2 IMPLANT
INTRODUCER DILATOR DOUBLE (INTRODUCER) ×2 IMPLANT
KIT TURNOVER CYSTO (KITS) ×2 IMPLANT
PACK CYSTO AR (MISCELLANEOUS) ×2 IMPLANT
SET CYSTO W/LG BORE CLAMP LF (SET/KITS/TRAYS/PACK) ×2 IMPLANT
SHEATH URETERAL 12FRX35CM (MISCELLANEOUS) ×2 IMPLANT
SOL .9 NS 3000ML IRR  AL (IV SOLUTION) ×1
SOL .9 NS 3000ML IRR UROMATIC (IV SOLUTION) ×1 IMPLANT
STENT URET 6FRX24 CONTOUR (STENTS) IMPLANT
STENT URET 6FRX26 CONTOUR (STENTS) ×2 IMPLANT
SURGILUBE 2OZ TUBE FLIPTOP (MISCELLANEOUS) ×2 IMPLANT
VALVE UROSEAL ADJ ENDO (VALVE) ×2 IMPLANT
WATER STERILE IRR 1000ML POUR (IV SOLUTION) ×2 IMPLANT

## 2019-07-30 NOTE — Transfer of Care (Signed)
Immediate Anesthesia Transfer of Care Note  Patient: Shawn Lindsey  Procedure(s) Performed: CYSTOSCOPY/URETEROSCOPY/HOLMIUM LASER/STENT Exchange (Right )  Patient Location: PACU  Anesthesia Type:General  Level of Consciousness: awake  Airway & Oxygen Therapy: Patient Spontanous Breathing and Patient connected to face mask oxygen  Post-op Assessment: Report given to RN and Post -op Vital signs reviewed and stable  Post vital signs: Reviewed  Last Vitals:  Vitals Value Taken Time  BP 100/54 07/30/19 1115  Temp 37.7 C 07/30/19 1115  Pulse 69 07/30/19 1119  Resp 18 07/30/19 1119  SpO2 99 % 07/30/19 1119  Vitals shown include unvalidated device data.  Last Pain:  Vitals:   07/30/19 0718  TempSrc: Tympanic  PainSc: 0-No pain         Complications: No apparent anesthesia complications

## 2019-07-30 NOTE — Anesthesia Preprocedure Evaluation (Signed)
Anesthesia Evaluation  Patient identified by MRN, date of birth, ID band Patient awake    Reviewed: Allergy & Precautions, NPO status , Patient's Chart, lab work & pertinent test results  History of Anesthesia Complications Negative for: history of anesthetic complications  Airway Mallampati: II  TM Distance: <3 FB Neck ROM: limited    Dental  (+) Edentulous Lower, Edentulous Upper   Pulmonary neg shortness of breath, sleep apnea (has CPAP but does not wear it) , pneumonia, COPD,  COPD inhaler, Current Smoker and Patient abstained from smoking.,    breath sounds clear to auscultation- rhonchi (-) wheezing      Cardiovascular Exercise Tolerance: Good hypertension, Pt. on medications (-) CAD, (-) Past MI, (-) Cardiac Stents and (-) CABG  Rhythm:Regular Rate:Normal - Systolic murmurs and - Diastolic murmurs    Neuro/Psych Seizures - (one seizure in setting of septic pnemonia),  Anxiety  Neuromuscular disease CVA (occasional L arm numbness)    GI/Hepatic Neg liver ROS, GERD  ,  Endo/Other  diabetes, Oral Hypoglycemic AgentsHypothyroidism   Renal/GU Renal disease (nephrolithiasis)     Musculoskeletal negative musculoskeletal ROS (+)   Abdominal (+) + obese,   Peds  Hematology negative hematology ROS (+)   Anesthesia Other Findings Past Medical History: No date: Anxiety     Comment:  mild 2015: Cancer ([place])     Comment:  tonsil No date: COPD (chronic obstructive pulmonary disease) (HCC) No date: Diabetes mellitus without complication (HCC) No date: Elevated cholesterol No date: GERD (gastroesophageal reflux disease) No date: Hypertension No date: Hypothyroidism 2015: Seizures ([place])     Comment:  related to sepsis 2019: Sleep apnea     Comment:  has a Cpap machime  2007: Stroke ([place])     Comment:  no peripheral vision on left side of both eyes & left               arm gets numb under stress   Reproductive/Obstetrics                             Anesthesia Physical  Anesthesia Plan  ASA: III  Anesthesia Plan: General   Post-op Pain Management:    Induction: Intravenous  PONV Risk Score and Plan: 0 and Ondansetron  Airway Management Planned: Oral ETT  Additional Equipment:   Intra-op Plan:   Post-operative Plan: Extubation in OR  Informed Consent: I have reviewed the patients History and Physical, chart, labs and discussed the procedure including the risks, benefits and alternatives for the proposed anesthesia with the patient or authorized representative who has indicated his/her understanding and acceptance.     Dental advisory given  Plan Discussed with: CRNA and Anesthesiologist  Anesthesia Plan Comments: (Patient consented for risks of anesthesia including but not limited to:  - adverse reactions to medications - damage to teeth, lips or other oral mucosa - sore throat or hoarseness - Damage to heart, brain, lungs or loss of life  Patient voiced understanding.)        Anesthesia Quick Evaluation

## 2019-07-30 NOTE — Addendum Note (Signed)
Addendum  created 07/30/19 1232 by Doreen Salvage, CRNA   Charge Capture section accepted

## 2019-07-30 NOTE — Anesthesia Postprocedure Evaluation (Signed)
Anesthesia Post Note  Patient: Shawn Lindsey  Procedure(s) Performed: CYSTOSCOPY/URETEROSCOPY/HOLMIUM LASER/STENT Exchange (Right )  Patient location during evaluation: PACU Anesthesia Type: General Level of consciousness: awake and alert Pain management: pain level controlled Vital Signs Assessment: post-procedure vital signs reviewed and stable Respiratory status: spontaneous breathing, nonlabored ventilation, respiratory function stable and patient connected to nasal cannula oxygen Cardiovascular status: blood pressure returned to baseline and stable Postop Assessment: no apparent nausea or vomiting Anesthetic complications: no     Last Vitals:  Vitals:   07/30/19 1156 07/30/19 1205  BP: (!) 98/41 (!) 122/56  Pulse: 67 64  Resp: 16 16  Temp: (!) 36.2 C   SpO2: 95% 98%    Last Pain:  Vitals:   07/30/19 1156  TempSrc: Temporal  PainSc: 0-No pain                 Arita Miss

## 2019-07-30 NOTE — Interval H&P Note (Signed)
History and Physical Interval Note: Mr. Matters presents today for completion of staged ureteroscopy for a partial lower pole staghorn calculus.  1 week ago he underwent treatment of his right proximal ureteral calculus and approximately 60% of the lower pole staghorn.  All questions were answered and he desires to proceed.  Denies fever, chills Lungs: Clear CV: RRR  07/30/2019 8:30 AM  [facility] Nurse  has presented today for surgery, with the diagnosis of right nephrolithiasis, right ureteral calculus.  The various methods of treatment have been discussed with the patient and family. After consideration of risks, benefits and other options for treatment, the patient has consented to  Procedure(s): CYSTOSCOPY/URETEROSCOPY/HOLMIUM LASER/STENT Exchange (Right) as a surgical intervention.  The patient's history has been reviewed, patient examined, no change in status, stable for surgery.  I have reviewed the patient's chart and labs.  Questions were answered to the patient's satisfaction.     [place]

## 2019-07-30 NOTE — Op Note (Signed)
Preoperative diagnosis: Right nephrolithiasis   Postoperative diagnosis: Right nephrolithiasis  Procedure:  Cystoscopy Right ureteroscopy and stone removal Ureteroscopic laser lithotripsy Right ureteral stent exchange (6FR) 26 cm Right retrograde pyelography with interpretation  Surgeon: Nicki Reaper C. Kizzi Overbey, M.D.  Anesthesia: General  Complications: None  Intraoperative findings:  1.  Right retrograde pyelography post procedure showed no filling defects or contrast extravasation  EBL: Minimal  Specimens: None   Indication: Shawn Lindsey is a 69 y.o.  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  He underwent ureteroscopic removal of a right ureteral calculus and staged treatment of his lower pole partial staghorn.  He completes today for completion of the staged procedure.  After reviewing the management options for treatment, the patient elected to proceed with the above surgical procedure(s). We have discussed the potential benefits and risks of the procedure, side effects of the proposed treatment, the likelihood of the patient achieving the goals of the procedure, and any potential problems that might occur during the procedure or recuperation. Informed consent has been obtained.  Description of procedure:  The patient was taken to the operating room and general anesthesia was induced.  The patient was placed in the dorsal lithotomy position, prepped and draped in the usual sterile fashion, and preoperative antibiotics were administered. A preoperative time-out was performed.   A 21 French cystoscope was lubricated and passed under direct vision.  The urethra was normal in caliber without stricture.  The prostate demonstrated mild lateral lobe enlargement with mild bladder neck elevation.  Panendoscopy was performed and the bladder mucosa showed no solid or papillary lesions.  Inflammatory changes were noted at the site of his right ureteral  stent.  Attention was directed to the right ureteral orifice and a 0.038 Sensor wire was then advanced up the ureter into the renal pelvis under fluoroscopic guidance.  The cystoscope was repassed and the ureteral stent was grasped with endoscopic forceps and removed.  al-lumen catheter was placed over the sensor wire and a second sensor wire was placed in a similar fashion.  A 12/14 French ureteral access sheath was placed over the working wire under fluoroscopic guidance without difficulty.  A digital flexible ureteroscope was placed through the access sheath and advanced into the renal pelvis without difficulty.  The calculus was identified in a lower pole calyx.    A 200 m holmium laser fiber was placed through the ureteroscope and the stone was dusted at a setting of 0.2 J and frequency of 53 hz.   Once completely dusted several larger fragments were then removed from the collecting system with a zero tip nitinol basket.  Retrograde pyelogram was performed with findings as described above. Each calyx was sequentially examined under fluoroscopic guidance and no significant size fragments were identified.  The ureteral access sheath and ureteroscope were removed in tandem and the ureter showed no evidence of injury or perforation.  A 6 FR/ 26 CM stent was placed under fluoroscopic guidance.  The wire was then removed with an adequate stent curl noted in the upper pole calyx as well as in the bladder.  The bladder was then emptied and the procedure ended.  The patient appeared to tolerate the procedure well and without complications.  After anesthetic reversal the patient was transported to the PACU in stable condition.   Plan: Office follow-up in 2 weeks for cystoscopy with stent removal   Shawn Giovanni, MD

## 2019-07-30 NOTE — Discharge Instructions (Signed)
DISCHARGE INSTRUCTIONS FOR KIDNEY STONE/URETERAL STENT   MEDICATIONS:  1. Resume all your other meds from home.  2.  AZO (over-the-counter) can help with the burning/stinging when you urinate.   ACTIVITY:  1. May resume regular activities in 24 hours. 2. No driving while on narcotic pain medications  3. Drink plenty of water  4. Continue to walk at home - you can still get blood clots when you are at home, so keep active, but don't over do it.  5. May return to work/school tomorrow or when you feel ready   BATHING:  1. You can shower.   SIGNS/SYMPTOMS TO CALL:  Please call us if you have a fever greater than 101.5, uncontrolled nausea/vomiting, uncontrolled pain, dizziness, unable to urinate, excessively bloody urine, chest pain, shortness of breath, leg swelling, leg pain, or any other concerns or questions.   Urinary frequency, urgency, bladder spasm and blood in urine are common postoperative symptoms  You can reach [place] at [phone].   FOLLOW-UP:  1. You will be contacted for an appointment for stent removal by our office  AMBULATORY SURGERY  DISCHARGE INSTRUCTIONS   1) The drugs that you were given will stay in your system until tomorrow so for the next 24 hours you should not:  A) Drive an automobile B) Make any legal decisions C) Drink any alcoholic beverage   2) You may resume regular meals tomorrow.  Today it is better to start with liquids and gradually work up to solid foods.  You may eat anything you prefer, but it is better to start with liquids, then soup and crackers, and gradually work up to solid foods.   3) Please notify your doctor immediately if you have any unusual bleeding, trouble breathing, redness and pain at the surgery site, drainage, fever, or pain not relieved by medication.    4) Additional Instructions:        Please contact your physician with any problems or Same Day Surgery at [phone], Monday through Friday 6 am to 4 pm,  or [place] at [facility] number at [phone].

## 2019-07-30 NOTE — Anesthesia Procedure Notes (Signed)
Procedure Name: Intubation Date/Time: 07/30/2019 8:50 AM Performed by: Doreen Salvage, CRNA Pre-anesthesia Checklist: Patient identified, Patient being monitored, Timeout performed, Emergency Drugs available and Suction available Patient Re-evaluated:Patient Re-evaluated prior to induction Oxygen Delivery Method: Circle system utilized Preoxygenation: Pre-oxygenation with 100% oxygen Induction Type: IV induction Ventilation: Mask ventilation without difficulty Laryngoscope Size: Mac and 4 Grade View: Grade I Tube type: Oral Tube size: 7.5 mm Number of attempts: 1 Airway Equipment and Method: Stylet Placement Confirmation: ETT inserted through vocal cords under direct vision,  positive ETCO2 and breath sounds checked- equal and bilateral Secured at: 23 cm Tube secured with: Tape Dental Injury: Teeth and Oropharynx as per pre-operative assessment

## 2019-07-30 NOTE — Progress Notes (Signed)
Spoke with anesthesia. Patients blood sugar is 72. Patient does not take medications for his diabetes. Per Anesthesia recheck blood sugar before going back to the OR to make sure he isn't trending down.

## 2019-08-09 ENCOUNTER — Other Ambulatory Visit: Payer: Self-pay | Admitting: Urology

## 2019-08-09 DIAGNOSIS — N200 Calculus of kidney: Secondary | ICD-10-CM

## 2019-08-12 ENCOUNTER — Other Ambulatory Visit: Payer: Self-pay

## 2019-08-12 ENCOUNTER — Other Ambulatory Visit: Payer: Medicare HMO

## 2019-08-12 DIAGNOSIS — N201 Calculus of ureter: Secondary | ICD-10-CM

## 2019-08-12 LAB — URINALYSIS, COMPLETE
Bilirubin, UA: NEGATIVE
Glucose, UA: NEGATIVE
Ketones, UA: NEGATIVE
Nitrite, UA: NEGATIVE
Protein,UA: NEGATIVE
Specific Gravity, UA: 1.02 (ref 1.005–1.030)
Urobilinogen, Ur: 0.2 mg/dL (ref 0.2–1.0)
pH, UA: 8.5 — ABNORMAL HIGH (ref 5.0–7.5)

## 2019-08-12 LAB — MICROSCOPIC EXAMINATION
Epithelial Cells (non renal): NONE SEEN /hpf (ref 0–10)
Mucus, UA: NONE SEEN
RBC, Urine: >30 /hpf — ABNORMAL HIGH (ref 0–2)

## 2019-08-17 NOTE — Progress Notes (Signed)
08/19/19 8:46 AM   [facility] Nurse [birth year] [phone]  Referring provider: Laurell Josephs, MD [id number] [facility] [place],  [place] [address]  Chief Complaint  Patient presents with  . Follow-up    HPI: Shawn Lindsey is a 69 y.o. M who returns today for follow-up of nephrolithiasis.   -underwent staged ureteroscopy w/ laser lithotripsy for partial staghorn calculus -removed 5 mm right proximal ureteral calculus  -scheduled for stent removal today however +urine culture Pseudomonas -KUB today reviewed  PMH: Past Medical History:  Diagnosis Date  . Anxiety    mild  . Cancer ([place]) 2015   tonsil  . COPD (chronic obstructive pulmonary disease) ([place])   . Diabetes mellitus without complication ([place])   . Elevated cholesterol   . GERD (gastroesophageal reflux disease)   . Hypertension   . Hypothyroidism   . Seizures ([place]) 2015   related to sepsis  . Sleep apnea 2019   has a Cpap machime   . Stroke [facility]) 2007   no peripheral vision on left side of both eyes & left arm gets numb under stress    Surgical History: Past Surgical History:  Procedure Laterality Date  . CYSTOSCOPY W/ RETROGRADES Right 07/23/2019   Procedure: CYSTOSCOPY WITH RETROGRADE PYELOGRAM;  Surgeon: Abbie Sons, MD;  Location: [facility] ORS;  Service: Urology;  Laterality: Right;  . CYSTOSCOPY WITH STENT PLACEMENT Right 07/05/2019   Procedure: CYSTOSCOPY WITH STENT PLACEMENT;  Surgeon: Abbie Sons, MD;  Location: [facility] ORS;  Service: Urology;  Laterality: Right;  . CYSTOSCOPY/URETEROSCOPY/HOLMIUM LASER/STENT PLACEMENT Right 07/23/2019   Procedure: CYSTOSCOPY/URETEROSCOPY/HOLMIUM LASER/STENT Exchange;  Surgeon: Abbie Sons, MD;  Location: [facility] ORS;  Service: Urology;  Laterality: Right;  . CYSTOSCOPY/URETEROSCOPY/HOLMIUM LASER/STENT PLACEMENT Right 07/30/2019   Procedure: CYSTOSCOPY/URETEROSCOPY/HOLMIUM LASER/STENT Exchange;  Surgeon: Abbie Sons, MD;  Location: [facility]  ORS;  Service: Urology;  Laterality: Right;  . ELECTROMAGNETIC NAVIGATION BROCHOSCOPY N/A 03/09/2016   Procedure: ELECTROMAGNETIC NAVIGATION BRONCHOSCOPY;  Surgeon: Flora Lipps, MD;  Location: [facility] ORS;  Service: Cardiopulmonary;  Laterality: N/A;  . TONSILLECTOMY    . TRACHEOSTOMY      Home Medications:  Allergies as of 08/19/2019   No Known Allergies     Medication List       Accurate as of August 19, 2019  8:46 AM. If you have any questions, ask your nurse or doctor.        acetaminophen 500 MG tablet Commonly known as: TYLENOL Take 500 mg by mouth every 4 (four) hours as needed for moderate pain.   amLODipine 5 MG tablet Commonly known as: NORVASC Take 5 mg by mouth daily.   aspirin 81 MG EC tablet Take 1 tablet (81 mg total) by mouth daily.   atorvastatin 80 MG tablet Commonly known as: LIPITOR Take 80 mg by mouth daily at 6 PM.   Cholecalciferol 50 MCG (2000 UT) Caps Take 2,000 Units by mouth daily.   Fish Oil 1000 MG Caps Take 1,000 mg by mouth daily.   FLUoxetine 10 MG capsule Commonly known as: PROZAC Take 10 mg by mouth daily.   gabapentin 600 MG tablet Commonly known as: NEURONTIN Take 600 mg by mouth at bedtime.   levETIRAcetam 500 MG tablet Commonly known as: KEPPRA Take 500 mg by mouth 2 (two) times daily.   levothyroxine 125 MCG tablet Commonly known as: SYNTHROID Take 125 mcg by mouth daily before breakfast.   magnesium oxide 400 MG tablet Commonly known as: MAG-OX  Take 400 mg by mouth 2 (two) times daily.   nicotine 21 mg/24hr patch Commonly known as: NICODERM CQ - dosed in mg/24 hours Place 21 mg onto the skin daily.   Normal Saline Flush 0.9 % Soln Inject 10 mLs into the vein See admin instructions. Inject 10 ml into port before and after IV ABX Infusion 3 times daily at 0600, 1400 and 2200   omeprazole 20 MG capsule Commonly known as: PRILOSEC Take 20 mg by mouth daily.   Ventolin HFA 108 (90 Base) MCG/ACT inhaler Generic drug:  albuterol Inhale 1-2 puffs into the lungs every 6 (six) hours as needed (for shortness of breath/wheezing.).       Allergies: No Known Allergies  Family History: Family History  Problem Relation Age of Onset  . Diabetes Brother   . Dementia Mother     Social History:  reports that he has been smoking. He has a 40.00 pack-year smoking history. He has never used smokeless tobacco. He reports that he does not drink alcohol or use drugs.   Physical Exam: BP 135/79   Pulse 69   Ht 5\' 10"  (1.778 m)   Wt 260 lb (117.9 kg)   BMI 37.31 kg/m   Constitutional:  Alert and oriented, No acute distress. HEENT: [place] AT, moist mucus membranes.  Trachea midline, no masses. Cardiovascular: No clubbing, cyanosis, or edema. Respiratory: Normal respiratory effort, no increased work of breathing. Skin: No rashes, bruises or suspicious lesions. Neurologic: Grossly intact, no focal deficits, moving all 4 extremities. Psychiatric: Normal mood and affect.  Laboratory Data:  Lab Results  Component Value Date   CREATININE 1.02 07/11/2019   Pertinent Imaging: KUB reviewed and although it appears to be a persistent partial staghorn intraoperatively the remaining calculus was completely fragmented.  This may be subtle fragments within the lower pole calyces/infundibula  Assessment & Plan:    1.  Bacteriuria Most likely colonization and not MDR Proteus present prior to stenting Recommend at least 48 hours of antibiotics prior to stent removal Return for stent removal on Thursday  2.  Nephrolithiasis Status post stage ureteroscopy for a ureteral calculus and partial staghorn   [facility] [address], [place], [place] [address] [phone]  I, Lucas Mallow, am acting as a scribe for Dr. Nicki Reaper C. Karlisha Mathena,  I have reviewed the above documentation for accuracy and completeness, and I agree with the above.   Abbie Sons, MD

## 2019-08-18 LAB — CULTURE, URINE COMPREHENSIVE

## 2019-08-19 ENCOUNTER — Other Ambulatory Visit: Payer: Self-pay

## 2019-08-19 ENCOUNTER — Encounter: Payer: Self-pay | Admitting: Urology

## 2019-08-19 ENCOUNTER — Ambulatory Visit
Admission: RE | Admit: 2019-08-19 | Discharge: 2019-08-19 | Disposition: A | Discharge status: Home / Self Care | Payer: Medicare HMO | Attending: Urology | Admitting: Urology

## 2019-08-19 ENCOUNTER — Ambulatory Visit
Admission: RE | Admit: 2019-08-19 | Discharge: 2019-08-19 | Disposition: A | Discharge status: Home / Self Care | Payer: Medicare HMO | Source: Ambulatory Visit | Attending: Urology | Admitting: Urology

## 2019-08-19 ENCOUNTER — Ambulatory Visit (INDEPENDENT_AMBULATORY_CARE_PROVIDER_SITE_OTHER): Payer: Medicare HMO | Admitting: Urology

## 2019-08-19 VITALS — BP 135/79 | HR 69 | Ht 70.0 in | Wt 260.0 lb

## 2019-08-19 DIAGNOSIS — N200 Calculus of kidney: Secondary | ICD-10-CM | POA: Diagnosis present

## 2019-08-19 DIAGNOSIS — R8271 Bacteriuria: Secondary | ICD-10-CM

## 2019-08-19 MED ORDER — CIPROFLOXACIN HCL 500 MG PO TABS: 500.0000 mg | ORAL_TABLET | Freq: Two times a day (BID) | ORAL | 0 refills | Status: AC

## 2019-08-22 ENCOUNTER — Encounter: Payer: Self-pay | Admitting: Urology

## 2019-08-22 ENCOUNTER — Other Ambulatory Visit: Payer: Self-pay

## 2019-08-22 ENCOUNTER — Ambulatory Visit (INDEPENDENT_AMBULATORY_CARE_PROVIDER_SITE_OTHER): Payer: Medicare HMO | Admitting: Urology

## 2019-08-22 VITALS — BP 140/87 | HR 82 | Ht 72.0 in | Wt 260.0 lb

## 2019-08-22 DIAGNOSIS — N200 Calculus of kidney: Secondary | ICD-10-CM | POA: Diagnosis not present

## 2019-08-24 NOTE — Progress Notes (Signed)
Indications: Patient is 69 y.o. male who recently underwent staged ureteroscopic stone removal.  Refer to prior note 08/19/2019.  The patient is presenting today for stent removal.  Procedure:  Flexible Cystoscopy with stent removal (32440)  Timeout was performed and the correct patient, procedure and participants were identified.    Description:  The patient was prepped and draped in the usual sterile fashion. Flexible cystosopy was performed.  The stent was visualized, grasped, and removed intact without difficulty. The patient tolerated the procedure well.  A single dose of oral antibiotics was given.  Complications:  None  Plan: Follow-up in 6 weeks with KUB.  Instructed to call earlier for development of fever or flank pain.  [name], MD

## 2019-10-02 ENCOUNTER — Ambulatory Visit
Admission: RE | Admit: 2019-10-02 | Discharge: 2019-10-02 | Disposition: A | Discharge status: Home / Self Care | Payer: Medicare HMO | Source: Ambulatory Visit | Attending: Urology | Admitting: Urology

## 2019-10-02 ENCOUNTER — Other Ambulatory Visit: Payer: Self-pay

## 2019-10-02 ENCOUNTER — Encounter: Payer: Self-pay | Admitting: Urology

## 2019-10-02 ENCOUNTER — Ambulatory Visit: Payer: Medicare HMO | Admitting: Urology

## 2019-10-02 ENCOUNTER — Other Ambulatory Visit: Payer: Self-pay | Admitting: Family Medicine

## 2019-10-02 VITALS — BP 122/82 | HR 60 | Ht 70.0 in | Wt 260.0 lb

## 2019-10-02 DIAGNOSIS — N200 Calculus of kidney: Secondary | ICD-10-CM

## 2019-10-02 DIAGNOSIS — R351 Nocturia: Secondary | ICD-10-CM | POA: Diagnosis not present

## 2019-10-02 NOTE — Progress Notes (Signed)
10/01/19 10:25 AM   [facility] Nurse [birth year] [phone]  Referring provider: Laurell Josephs, MD [id number] [facility] [place],  [place] [address] Chief Complaint  Patient presents with  . Nephrolithiasis   Urologic History: -underwent staged ureteroscopy w/ laser lithotripsy for partial staghorn calculus -removed 5 mm right proximal ureteral calculus  - KUB on 08/19/2019 with persistent lower pole fragment  HPI: Shawn Lindsey is a 69 y.o. male who presents today for a follow up.  -Denies flank, abdominal, pelvic pain -Denies dysuria, recurrent UTI - Complains of nocturia q 2 hours; has a history of sleep apnea with cpap but currently not using the cpap   PMH: Past Medical History:  Diagnosis Date  . Anxiety    mild  . Cancer ([place]) 2015   tonsil  . COPD (chronic obstructive pulmonary disease) ([place])   . Diabetes mellitus without complication ([place])   . Elevated cholesterol   . GERD (gastroesophageal reflux disease)   . Hypertension   . Hypothyroidism   . Seizures ([place]) 2015   related to sepsis  . Sleep apnea 2019   has a Cpap machime   . Stroke [facility]) 2007   no peripheral vision on left side of both eyes & left arm gets numb under stress    Surgical History: Past Surgical History:  Procedure Laterality Date  . CYSTOSCOPY W/ RETROGRADES Right 07/23/2019   Procedure: CYSTOSCOPY WITH RETROGRADE PYELOGRAM;  Surgeon: Abbie Sons, MD;  Location: [facility] ORS;  Service: Urology;  Laterality: Right;  . CYSTOSCOPY WITH STENT PLACEMENT Right 07/05/2019   Procedure: CYSTOSCOPY WITH STENT PLACEMENT;  Surgeon: Abbie Sons, MD;  Location: [facility];  Service: Urology;  Laterality: Right;  . CYSTOSCOPY/URETEROSCOPY/HOLMIUM LASER/STENT PLACEMENT Right 07/23/2019   Procedure: CYSTOSCOPY/URETEROSCOPY/HOLMIUM LASER/STENT Exchange;  Surgeon: Abbie Sons, MD;  Location: [facility];  Service: Urology;  Laterality: Right;  . CYSTOSCOPY/URETEROSCOPY/HOLMIUM  LASER/STENT PLACEMENT Right 07/30/2019   Procedure: CYSTOSCOPY/URETEROSCOPY/HOLMIUM LASER/STENT Exchange;  Surgeon: Abbie Sons, MD;  Location: [facility];  Service: Urology;  Laterality: Right;  . ELECTROMAGNETIC NAVIGATION BROCHOSCOPY N/A 03/09/2016   Procedure: ELECTROMAGNETIC NAVIGATION BRONCHOSCOPY;  Surgeon: Flora Lipps, MD;  Location: [facility] OR[facility];  Service: Cardiopulmonary;  Laterality: N/A;  . TONSILLECTOMY    . TRACHEOSTOMY      Home Medications:  Allergies as of 10/02/2019   No Known Allergies     Medication List       Accurate as of October 02, 2019 10:25 AM. If you have any questions, ask your nurse or doctor.        STOP taking these medications   Normal Saline Flush 0.9 % Soln Stopped by: Abbie Sons, MD     TAKE these medications   acetaminophen 500 MG tablet Commonly known as: TYLENOL Take 500 mg by mouth every 4 (four) hours as needed for moderate pain.   amLODipine 5 MG tablet Commonly known as: NORVASC Take 5 mg by mouth daily.   aspirin 81 MG EC tablet Take 1 tablet (81 mg total) by mouth daily.   atorvastatin 80 MG tablet Commonly known as: LIPITOR Take 80 mg by mouth daily at 6 PM.   Cholecalciferol 50 MCG (2000 UT) Caps Take 2,000 Units by mouth daily.   Fish Oil 1000 MG Caps Take 1,000 mg by mouth daily.   FLUoxetine 10 MG capsule Commonly known as: PROZAC Take 10 mg by mouth daily.   gabapentin 600 MG tablet Commonly known as: NEURONTIN Take  600 mg by mouth at bedtime.   levETIRAcetam 500 MG tablet Commonly known as: KEPPRA Take 500 mg by mouth 2 (two) times daily.   levothyroxine 125 MCG tablet Commonly known as: SYNTHROID Take 125 mcg by mouth daily before breakfast.   magnesium oxide 400 MG tablet Commonly known as: MAG-OX Take 400 mg by mouth 2 (two) times daily.   nicotine 21 mg/24hr patch Commonly known as: NICODERM CQ - dosed in mg/24 hours Place 21 mg onto the skin daily.   omeprazole 20 MG capsule Commonly known as:  PRILOSEC Take 20 mg by mouth daily.   Ventolin HFA 108 (90 Base) MCG/ACT inhaler Generic drug: albuterol Inhale 1-2 puffs into the lungs every 6 (six) hours as needed (for shortness of breath/wheezing.).       Allergies: No Known Allergies  Family History: Family History  Problem Relation Age of Onset  . Diabetes Brother   . Dementia Mother     Social History:  reports that he has been smoking. He has a 40.00 pack-year smoking history. He has never used smokeless tobacco. He reports that he does not drink alcohol or use drugs.   Physical Exam: BP 122/82   Pulse 60   Ht 5\' 10"  (1.778 m)   Wt 260 lb (117.9 kg)   BMI 37.31 kg/m   Constitutional:  Alert and oriented, No acute distress. HEENT: [place] AT, moist mucus membranes.  Trachea midline, no masses. Cardiovascular: No clubbing, cyanosis, or edema. Respiratory: Normal respiratory effort, no increased work of breathing. Skin: No rashes, bruises or suspicious lesions. Neurologic: Grossly intact, no focal deficits, moving all 4 extremities. Psychiatric: Normal mood and affect.   Pertinent Imaging: KUB performed today reviewed and 14 mm faint lower pole fragment   Assessment & Plan:    1.  Nephrolithiasis - Residual right lower pole calculus, currently asymptomatic -Discussed options of observations and shock wave lithotripsy and he has elected observation -Repeat KUB in 3 months and will call with results.  -6 month office follow up and he will call earlier for flank pain or recurrent UTI  2. Nocturia -Discussed most common causes including sleep apnea and nocturnal polyuria  Since he had the diagnosis of sleep apnea and not using his cpap, would recommend he try this first.  [facility] [address] [place], [place] [address] [phone]  I, Joneen Boers Peace, am acting as a Education administrator for Dr. Nicki Reaper C. Dakwan Pridgen.  I have reviewed the above documentation for accuracy and completeness,  and I agree with the above.    Abbie Sons, MD

## 2019-10-02 NOTE — Patient Instructions (Signed)
KUB only in 3 months

## 2019-10-03 ENCOUNTER — Encounter: Payer: Self-pay | Admitting: Urology

## 2020-02-28 ENCOUNTER — Other Ambulatory Visit: Payer: Self-pay | Admitting: Infectious Diseases

## 2020-02-28 DIAGNOSIS — R918 Other nonspecific abnormal finding of lung field: Secondary | ICD-10-CM

## 2020-03-12 ENCOUNTER — Ambulatory Visit
Admission: RE | Admit: 2020-03-12 | Discharge: 2020-03-12 | Disposition: A | Discharge status: Home / Self Care | Payer: Medicare HMO | Source: Ambulatory Visit | Attending: Infectious Diseases | Admitting: Infectious Diseases

## 2020-03-12 ENCOUNTER — Other Ambulatory Visit: Payer: Self-pay

## 2020-03-12 DIAGNOSIS — R918 Other nonspecific abnormal finding of lung field: Secondary | ICD-10-CM

## 2020-04-09 ENCOUNTER — Ambulatory Visit: Payer: Self-pay | Admitting: Urology

## 2020-04-17 ENCOUNTER — Telehealth: Payer: Self-pay | Admitting: Infectious Diseases

## 2020-04-17 NOTE — Telephone Encounter (Signed)
Is there something sooner 

## 2020-04-17 NOTE — Telephone Encounter (Signed)
Hi Waunita Schooner, wondering if you could see this patient in follow-up.  See the CT scan below which shows worsening of his known mass. Thanks Waunita Schooner  IMPRESSION: 1. Significant interval increase in a dense, masslike irregular consolidation primarily involving the right upper lobe, with architectural distortion and volume loss. There are new and enlarged satellite nodules in the right upper lobe as well as the right middle lobe. This remains highly concerning for adenocarcinoma although given generally quite indolent behavior could reflect an unusual atypical infection, and as on prior examination, it is not clear from clinical records available that tissue diagnosis has been established. Consider tissue sampling and/or PET-CT to further characterize. 2. There is a new, nonspecific ground-glass opacity of the anterior left upper lobe, which is more ill-defined and generally more nonspecific but likewise concerning for malignancy. 3. Unchanged prominent mediastinal and right hilar lymph nodes. 4. Emphysema (ICD10-J43.9). 5. Coronary artery disease.  Aortic Atherosclerosis (ICD10-I70.0). 6. Enlargement of the main pulmonary artery, as can be seen in pulmonary hypertension.   Electronically Signed   By: Eddie Candle M.D.   On: 03/13/2020 08:43

## 2020-04-17 NOTE — Telephone Encounter (Signed)
Hi Margie,   Can you have this patient follow up with either myself or Dr Duwayne Heck?

## 2020-04-17 NOTE — Telephone Encounter (Signed)
appointment scheduled for 05/21/20 at 9:30, as this was first available.   Dr. Mortimer Fries is this okay or is sooner apt needed?

## 2020-04-20 NOTE — Telephone Encounter (Signed)
Would it be okay for patient to see NP?

## 2020-04-22 NOTE — Telephone Encounter (Signed)
Recommend MD ( myself or Dr Darnell Level) needs to be set for [facility]

## 2020-04-23 NOTE — Telephone Encounter (Signed)
Will hold to discuss with Dr. Patsey Berthold.

## 2020-05-06 NOTE — Telephone Encounter (Signed)
appt scheduled for 05/07/2020 at 8:30. Patient is aware and voiced his understanding.  Nothing further is needed.

## 2020-05-06 NOTE — Telephone Encounter (Signed)
Will discuss with Dr. Patsey Berthold on 05/07/2020

## 2020-05-07 ENCOUNTER — Ambulatory Visit: Payer: Medicare HMO | Admitting: Pulmonary Disease

## 2020-05-07 ENCOUNTER — Telehealth: Payer: Self-pay | Admitting: Internal Medicine

## 2020-05-07 DIAGNOSIS — R911 Solitary pulmonary nodule: Secondary | ICD-10-CM

## 2020-05-07 NOTE — Telephone Encounter (Signed)
Patient was scheduled for OV with LG today at 8:30, he canceled due to transportation. Patient is scheduled for 05/21/2020 with Dr. Mortimer Fries. Dr. Patsey Berthold reviewed patients chart and recommended a PET prior to 05/21/2020 visit.   Dr. Mortimer Fries, please advise. Thanks

## 2020-05-07 NOTE — Telephone Encounter (Signed)
YES great Idea! PET scan to stage head and neck cancer

## 2020-05-07 NOTE — Telephone Encounter (Signed)
Lm for patient to make him aware of below recommendations.

## 2020-05-08 NOTE — Telephone Encounter (Signed)
Pt returning missed call. Can be reached at [phone]

## 2020-05-08 NOTE — Telephone Encounter (Signed)
Patient is aware of recommendations.  PET has been ordered, as patient is agreeable.  Nothing further needed.

## 2020-05-08 NOTE — Telephone Encounter (Signed)
Lm x2 for patient. Will attempt to call once more due to nature of call.

## 2020-05-21 ENCOUNTER — Other Ambulatory Visit: Payer: Self-pay

## 2020-05-21 ENCOUNTER — Encounter: Payer: Self-pay | Admitting: Internal Medicine

## 2020-05-21 ENCOUNTER — Ambulatory Visit: Payer: Medicare HMO | Admitting: Internal Medicine

## 2020-05-21 ENCOUNTER — Ambulatory Visit
Admission: RE | Admit: 2020-05-21 | Discharge: 2020-05-21 | Disposition: A | Discharge status: Home / Self Care | Payer: Medicare HMO | Source: Ambulatory Visit | Attending: Internal Medicine | Admitting: Internal Medicine

## 2020-05-21 ENCOUNTER — Other Ambulatory Visit: Payer: Self-pay | Admitting: Internal Medicine

## 2020-05-21 VITALS — BP 110/80 | HR 81 | Temp 96.6°F | Ht 70.0 in | Wt 287.0 lb

## 2020-05-21 DIAGNOSIS — J449 Chronic obstructive pulmonary disease, unspecified: Secondary | ICD-10-CM | POA: Diagnosis not present

## 2020-05-21 DIAGNOSIS — J439 Emphysema, unspecified: Secondary | ICD-10-CM | POA: Insufficient documentation

## 2020-05-21 DIAGNOSIS — R911 Solitary pulmonary nodule: Secondary | ICD-10-CM

## 2020-05-21 DIAGNOSIS — K8020 Calculus of gallbladder without cholecystitis without obstruction: Secondary | ICD-10-CM | POA: Insufficient documentation

## 2020-05-21 DIAGNOSIS — N200 Calculus of kidney: Secondary | ICD-10-CM | POA: Diagnosis not present

## 2020-05-21 DIAGNOSIS — R9389 Abnormal findings on diagnostic imaging of other specified body structures: Secondary | ICD-10-CM

## 2020-05-21 DIAGNOSIS — F17210 Nicotine dependence, cigarettes, uncomplicated: Secondary | ICD-10-CM

## 2020-05-21 DIAGNOSIS — I700 Atherosclerosis of aorta: Secondary | ICD-10-CM | POA: Insufficient documentation

## 2020-05-21 DIAGNOSIS — R918 Other nonspecific abnormal finding of lung field: Secondary | ICD-10-CM

## 2020-05-21 DIAGNOSIS — G4733 Obstructive sleep apnea (adult) (pediatric): Secondary | ICD-10-CM | POA: Diagnosis not present

## 2020-05-21 DIAGNOSIS — I2510 Atherosclerotic heart disease of native coronary artery without angina pectoris: Secondary | ICD-10-CM | POA: Insufficient documentation

## 2020-05-21 LAB — GLUCOSE, CAPILLARY: Glucose-Capillary: 126 mg/dL — ABNORMAL HIGH (ref 70–99)

## 2020-05-21 MED ORDER — UMECLIDINIUM BROMIDE 62.5 MCG/INH IN AEPB: 1.0000 | INHALATION_SPRAY | Freq: Every day | RESPIRATORY_TRACT | 6 refills | Status: DC

## 2020-05-21 MED ORDER — FLUDEOXYGLUCOSE F - 18 (FDG) INJECTION
13.5000 | Freq: Once | INTRAVENOUS | Status: AC | PRN
  Administered 2020-05-21: 14.1 via INTRAVENOUS

## 2020-05-21 NOTE — Patient Instructions (Addendum)
Continue Incruse Continue albuterol  PET SCAN NEEDED REFERRAL FOR MASK FITTING

## 2020-05-21 NOTE — H&P (View-Only) (Signed)
[facility] Consultation       Date: 05/21/2020,   MRN# [id number] Shawn Lindsey 1950-12-13       Admission                  Current  Shawn Lindsey is a 70 y.o. old male seen in consultation for RUL nodule and opacity with COPD at the request of Dr. Alyssa Grove.   SYNOPSIS Repeat CT chest on 07/02/2015 RUL cavitary lesions measures 5.0 x 2.2 x 3.6 cm.  Previously 5.4 x 2.5 x 3.5 cm.  Repeat CT chest 01/05/16 shows RUL cavitary mass 4.8x4.1 CM, previous measurement 4.6 x 3.8CM Repeat CT chest 08/2016 no significant change in size of RUL mass  CT chest 11/2021INDEPENDANTLY REVIEWED TODAY Significant interval-increase in a dense, masslike irregular consolidation primarily involving the right upper lobe, with architectural distortion and volume loss, the dominant component now measuring approximately 7.4 x 7.1 cm, previously 5.4 x 4.1 cm when measured most similarly There are new and enlarged satellite nodules in the right upper lobe new and enlarged nodules in the right middle lobe, the largest in the lateral segment measuring 2.4 x 1.8 cm, previously 0.8 x 0.5 cm There is a new, nonspecific ground-glass opacity of the anterior left upper lobe, which is more ill-defined, measuring 2.0 x 1.1 cm    PFT 12/2015   ratio 72%, FEv1 90% DLCo 57% 6MWT WNL  Office Arlyce Harman 01/16/17 Ratio 74% Fev1 92% predicted  Previous history entails Patient has been hospitalized approx 5  Years ago for pneumonia and resp failure requiring ICU care s/p trach Patient subsequently 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 patient sees Dr. Grayland Ormond for cancer care SYNOPSIS  CHIEF COMPLAINT:   FOLLOW UP COPD FOLLOW UP LUNG CNACER  HISTORY OF PRESENT ILLNESS   Regarding COPD  Seems to be stable at this time despite ongoing smoking  Doing well on Incruse and albuterol as needed   No exacerbation at this time No evidence of  heart failure at this time No evidence or signs of infection at this time No respiratory distress No fevers, chills, nausea, vomiting, diarrhea No evidence of lower extremity edema No evidence hemoptysis  Regarding head and neck cancer Patient has not had a recent CT chest due to his underlying malignancy Will obtain CT chest for interval changes and assessment REPEAT CT CHEST SHOWS INTERVAL CHANGES THAT HAVE INCREASED IN SIZE OVER LAST 1 year  CT chest November 2021 independently reviewed with patient today Right upper lobe nodular opacity is new from previous CT scan Left upper lobe nodular opacity pleural-based density which is new from previous CT scan            Regarding OSA Patient has been noncompliant Patient with a diagnosis of severe sleep apnea with AHI of 17 Patient needs to be reevaluated with mask fitting referral to be placed I have strongly advised that he restart his CPAP therapy    Smoking Assessment and Cessation Counseling Upon further questioning, Patient smokes 1/2 PPD I have advised patient to quit/stop smoking as soon as possible due to high risk for multiple medical problems  Patient is NOT willing to quit smoking  I have advised patient that we can assist and have options of Nicotine replacement therapy. I also advised patient on behavioral therapy and can provide oral medication therapy in conjunction with the other therapies Follow up next Office  visit  for assessment of smoking cessation Smoking cessation counseling advised for 4 minutes         Current Medication:  Current Outpatient Medications:  .  acetaminophen (TYLENOL) 500 MG tablet, Take 500 mg by mouth every 4 (four) hours as needed for moderate pain., Disp: , Rfl:  .  amLODipine (NORVASC) 5 MG tablet, Take 5 mg by mouth daily., Disp: , Rfl:  .  aspirin EC 81 MG EC tablet, Take 1 tablet (81 mg total) by mouth daily., Disp: 30 tablet, Rfl: 1 .  atorvastatin (LIPITOR) 80 MG  tablet, Take 80 mg by mouth daily at 6 PM. , Disp: , Rfl:  .  Cholecalciferol 2000 UNITS CAPS, Take 2,000 Units by mouth daily. , Disp: , Rfl:  .  FLUoxetine (PROZAC) 10 MG capsule, Take 10 mg by mouth daily., Disp: , Rfl:  .  gabapentin (NEURONTIN) 600 MG tablet, Take 600 mg by mouth at bedtime. , Disp: , Rfl:  .  levETIRAcetam (KEPPRA) 500 MG tablet, Take 500 mg by mouth 2 (two) times daily., Disp: , Rfl:  .  levothyroxine (SYNTHROID, LEVOTHROID) 125 MCG tablet, Take 125 mcg by mouth daily before breakfast. , Disp: , Rfl:  .  magnesium oxide (MAG-OX) 400 MG tablet, Take 400 mg by mouth 2 (two) times daily., Disp: , Rfl:  .  nicotine (NICODERM CQ - DOSED IN MG/24 HOURS) 21 mg/24hr patch, Place 21 mg onto the skin daily., Disp: , Rfl:  .  Omega-3 Fatty Acids (FISH OIL) 1000 MG CAPS, Take 1,000 mg by mouth daily. , Disp: , Rfl:  .  omeprazole (PRILOSEC) 20 MG capsule, Take 20 mg by mouth daily., Disp: , Rfl:  .  VENTOLIN HFA 108 (90 Base) MCG/ACT inhaler, Inhale 1-2 puffs into the lungs every 6 (six) hours as needed (for shortness of breath/wheezing.)., Disp: 1 g, Rfl: 6    ALLERGIES   Patient has no known allergies.     REVIEW OF SYSTEMS   Review of Systems  Constitutional: Negative for chills, fever and weight loss.  HENT: Negative.  Negative for congestion.   Respiratory: Positive for shortness of breath. Negative for cough, hemoptysis, sputum production and wheezing.   Cardiovascular: Positive for leg swelling. Negative for chest pain, palpitations and orthopnea.  Gastrointestinal: Negative for heartburn, nausea and vomiting.  Skin: Negative for rash.  Psychiatric/Behavioral: The patient is not nervous/anxious.   All other systems reviewed and are negative.    BP 110/80 (BP Location: Left Arm, Patient Position: Sitting, Cuff Size: Large)   Pulse 81   Temp (!) 96.6 F (35.9 C) (Temporal)   Ht 5\' 10"  (1.778 m)   Wt 287 lb (130.2 kg)   SpO2 94%   BMI 41.18 kg/m     Physical Examination:   General Appearance: No distress  Neuro:without focal findings,  speech normal,  HEENT: PERRLA, EOM intact.   Pulmonary: normal breath sounds, No wheezing.  CardiovascularNormal S1,S2.  No m/r/g.   Abdomen: Benign, Soft, non-tender. Renal:  No costovertebral tenderness  GU:  Not performed at this time. Endoc: No evident thyromegaly Skin:   warm, no rashes, no ecchymosis  Extremities: normal, no cyanosis, clubbing. PSYCHIATRIC: Mood, affect within normal limits.   ALL OTHER ROS ARE NEGATIVE     ASSESSMENT/PLAN   70 yo Pleasant white male with COPD with previous history of right upper lobe nodular cavitary opacification that has not increased in size over the last CT scans however his most recent CT chest  in November 2021 shows several new lesions and opacifications, she went also with underlying COPD with morbid obesity with a previous history of head and neck tonsillar cancer with ongoing tobacco abuse with underlying sleep apnea and deconditioned state     COPD Gold stage A Continue inhalers as prescribed No evidence of infection   Smoking cessation strongly advised  Right upper lobe cavitary lesion right upper lobe lung mass Patient went navigational bronchoscopy in March 09, 2016 and was negative for malignancy 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 Previous CT scan shows no obvious recurrence but repeat CT chest 03/2020 Shows interval increased in dodular opacities in both lungs-highly suggestive or recurrent cancer or new lung cancer-PET scan scheduled for today    Obesity -recommend significant weight loss -recommend changing diet  Deconditioned state -Recommend increased daily activity and exercise      OSA Continue CPAP as prescribed but NOT compliance with mask REFERRAL FOR MASK FITTING IN [place]  Chronic hypoxic respiratory failure from COPD Patient has been noncompliant with CPAP therapy and also has been  noncompliant with oxygen therapy   COVID-19 EDUCATION: The signs and symptoms of COVID-19 were discussed with the patient and how to seek care for testing.  The importance of social distancing was discussed today. Hand Washing Techniques and avoid touching face was advised.  MEDICATION ADJUSTMENTS/LABS AND TESTS ORDERED: Continue Incruse Continue albuterol Continue oxygen PET SCAN NEEDED   CURRENT MEDICATIONS REVIEWED AT LENGTH WITH PATIENT TODAY   Patient satisfied with Plan of action and management. All questions answered  Follow-up in 6 months  Time spent 45 minutes  Tammie Ellsworth Patricia Pesa, M.D.  [facility]  Medical Director [employer] Director [facility] Cardio-Pulmonary Department

## 2020-05-21 NOTE — Progress Notes (Signed)
[facility] Consultation       Date: 05/21/2020,   MRN# [id number] EWELL BENASSI 08/05/1950       Admission                  Current  MUKHTAR SHAMS is a 70 y.o. old male seen in consultation for RUL nodule and opacity with COPD at the request of Dr. Alyssa Grove.   SYNOPSIS Repeat CT chest on 07/02/2015 RUL cavitary lesions measures 5.0 x 2.2 x 3.6 cm.  Previously 5.4 x 2.5 x 3.5 cm.  Repeat CT chest 01/05/16 shows RUL cavitary mass 4.8x4.1 CM, previous measurement 4.6 x 3.8CM Repeat CT chest 08/2016 no significant change in size of RUL mass  CT chest 11/2021INDEPENDANTLY REVIEWED TODAY Significant interval-increase in a dense, masslike irregular consolidation primarily involving the right upper lobe, with architectural distortion and volume loss, the dominant component now measuring approximately 7.4 x 7.1 cm, previously 5.4 x 4.1 cm when measured most similarly There are new and enlarged satellite nodules in the right upper lobe new and enlarged nodules in the right middle lobe, the largest in the lateral segment measuring 2.4 x 1.8 cm, previously 0.8 x 0.5 cm There is a new, nonspecific ground-glass opacity of the anterior left upper lobe, which is more ill-defined, measuring 2.0 x 1.1 cm    PFT 12/2015   ratio 72%, FEv1 90% DLCo 57% 6MWT WNL  Office Arlyce Harman 01/16/17 Ratio 74% Fev1 92% predicted  Previous history entails Patient has been hospitalized approx 5  Years ago for pneumonia and resp failure requiring ICU care s/p trach Patient subsequently 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 patient sees Dr. Grayland Ormond for cancer care SYNOPSIS  CHIEF COMPLAINT:   FOLLOW UP COPD FOLLOW UP LUNG CNACER  HISTORY OF PRESENT ILLNESS   Regarding COPD  Seems to be stable at this time despite ongoing smoking  Doing well on Incruse and albuterol as needed   No exacerbation at this time No evidence of  heart failure at this time No evidence or signs of infection at this time No respiratory distress No fevers, chills, nausea, vomiting, diarrhea No evidence of lower extremity edema No evidence hemoptysis  Regarding head and neck cancer Patient has not had a recent CT chest due to his underlying malignancy Will obtain CT chest for interval changes and assessment REPEAT CT CHEST SHOWS INTERVAL CHANGES THAT HAVE INCREASED IN SIZE OVER LAST 1 year  CT chest November 2021 independently reviewed with patient today Right upper lobe nodular opacity is new from previous CT scan Left upper lobe nodular opacity pleural-based density which is new from previous CT scan            Regarding OSA Patient has been noncompliant Patient with a diagnosis of severe sleep apnea with AHI of 17 Patient needs to be reevaluated with mask fitting referral to be placed I have strongly advised that he restart his CPAP therapy    Smoking Assessment and Cessation Counseling Upon further questioning, Patient smokes 1/2 PPD I have advised patient to quit/stop smoking as soon as possible due to high risk for multiple medical problems  Patient is NOT willing to quit smoking  I have advised patient that we can assist and have options of Nicotine replacement therapy. I also advised patient on behavioral therapy and can provide oral medication therapy in conjunction with the other therapies Follow up next Office  visit  for assessment of smoking cessation Smoking cessation counseling advised for 4 minutes         Current Medication:  Current Outpatient Medications:  .  acetaminophen (TYLENOL) 500 MG tablet, Take 500 mg by mouth every 4 (four) hours as needed for moderate pain., Disp: , Rfl:  .  amLODipine (NORVASC) 5 MG tablet, Take 5 mg by mouth daily., Disp: , Rfl:  .  aspirin EC 81 MG EC tablet, Take 1 tablet (81 mg total) by mouth daily., Disp: 30 tablet, Rfl: 1 .  atorvastatin (LIPITOR) 80 MG  tablet, Take 80 mg by mouth daily at 6 PM. , Disp: , Rfl:  .  Cholecalciferol 2000 UNITS CAPS, Take 2,000 Units by mouth daily. , Disp: , Rfl:  .  FLUoxetine (PROZAC) 10 MG capsule, Take 10 mg by mouth daily., Disp: , Rfl:  .  gabapentin (NEURONTIN) 600 MG tablet, Take 600 mg by mouth at bedtime. , Disp: , Rfl:  .  levETIRAcetam (KEPPRA) 500 MG tablet, Take 500 mg by mouth 2 (two) times daily., Disp: , Rfl:  .  levothyroxine (SYNTHROID, LEVOTHROID) 125 MCG tablet, Take 125 mcg by mouth daily before breakfast. , Disp: , Rfl:  .  magnesium oxide (MAG-OX) 400 MG tablet, Take 400 mg by mouth 2 (two) times daily., Disp: , Rfl:  .  nicotine (NICODERM CQ - DOSED IN MG/24 HOURS) 21 mg/24hr patch, Place 21 mg onto the skin daily., Disp: , Rfl:  .  Omega-3 Fatty Acids (FISH OIL) 1000 MG CAPS, Take 1,000 mg by mouth daily. , Disp: , Rfl:  .  omeprazole (PRILOSEC) 20 MG capsule, Take 20 mg by mouth daily., Disp: , Rfl:  .  VENTOLIN HFA 108 (90 Base) MCG/ACT inhaler, Inhale 1-2 puffs into the lungs every 6 (six) hours as needed (for shortness of breath/wheezing.)., Disp: 1 g, Rfl: 6    ALLERGIES   Patient has no known allergies.     REVIEW OF SYSTEMS   Review of Systems  Constitutional: Negative for chills, fever and weight loss.  HENT: Negative.  Negative for congestion.   Respiratory: Positive for shortness of breath. Negative for cough, hemoptysis, sputum production and wheezing.   Cardiovascular: Positive for leg swelling. Negative for chest pain, palpitations and orthopnea.  Gastrointestinal: Negative for heartburn, nausea and vomiting.  Skin: Negative for rash.  Psychiatric/Behavioral: The patient is not nervous/anxious.   All other systems reviewed and are negative.    BP 110/80 (BP Location: Left Arm, Patient Position: Sitting, Cuff Size: Large)   Pulse 81   Temp (!) 96.6 F (35.9 C) (Temporal)   Ht 5\' 10"  (1.778 m)   Wt 287 lb (130.2 kg)   SpO2 94%   BMI 41.18 kg/m     Physical Examination:   General Appearance: No distress  Neuro:without focal findings,  speech normal,  HEENT: PERRLA, EOM intact.   Pulmonary: normal breath sounds, No wheezing.  CardiovascularNormal S1,S2.  No m/r/g.   Abdomen: Benign, Soft, non-tender. Renal:  No costovertebral tenderness  GU:  Not performed at this time. Endoc: No evident thyromegaly Skin:   warm, no rashes, no ecchymosis  Extremities: normal, no cyanosis, clubbing. PSYCHIATRIC: Mood, affect within normal limits.   ALL OTHER ROS ARE NEGATIVE     ASSESSMENT/PLAN   70 yo Pleasant white male with COPD with previous history of right upper lobe nodular cavitary opacification that has not increased in size over the last CT scans however his most recent CT chest  in November 2021 shows several new lesions and opacifications, she went also with underlying COPD with morbid obesity with a previous history of head and neck tonsillar cancer with ongoing tobacco abuse with underlying sleep apnea and deconditioned state     COPD Gold stage A Continue inhalers as prescribed No evidence of infection   Smoking cessation strongly advised  Right upper lobe cavitary lesion right upper lobe lung mass Patient went navigational bronchoscopy in March 09, 2016 and was negative for malignancy 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 Previous CT scan shows no obvious recurrence but repeat CT chest 03/2020 Shows interval increased in dodular opacities in both lungs-highly suggestive or recurrent cancer or new lung cancer-PET scan scheduled for today    Obesity -recommend significant weight loss -recommend changing diet  Deconditioned state -Recommend increased daily activity and exercise      OSA Continue CPAP as prescribed but NOT compliance with mask REFERRAL FOR MASK FITTING IN [place]  Chronic hypoxic respiratory failure from COPD Patient has been noncompliant with CPAP therapy and also has been  noncompliant with oxygen therapy   COVID-19 EDUCATION: The signs and symptoms of COVID-19 were discussed with the patient and how to seek care for testing.  The importance of social distancing was discussed today. Hand Washing Techniques and avoid touching face was advised.  MEDICATION ADJUSTMENTS/LABS AND TESTS ORDERED: Continue Incruse Continue albuterol Continue oxygen PET SCAN NEEDED   CURRENT MEDICATIONS REVIEWED AT LENGTH WITH PATIENT TODAY   Patient satisfied with Plan of action and management. All questions answered  Follow-up in 6 months  Time spent 45 minutes  Vasili Fok Patricia Pesa, M.D.  [facility]  Medical Director [place] Director [facility] Card[facility] Department

## 2020-05-21 NOTE — Progress Notes (Signed)
CT and PET scans discussed with Patient plan for SUPER D CT scan and ENB by Dr. Duwayne Heck

## 2020-05-25 ENCOUNTER — Ambulatory Visit: Payer: Self-pay | Admitting: Urology

## 2020-05-25 ENCOUNTER — Telehealth: Payer: Self-pay

## 2020-05-25 NOTE — Telephone Encounter (Signed)
Pt called back and states if Dr. Mortimer Fries thinks he needs to go ahead w/ the procedure before the CT, he is willing to sched it. [phone]

## 2020-05-25 NOTE — Telephone Encounter (Signed)
Phone pre admit visit 05/28/20 between 1-5 and covid test 06/04/20 between 8-1.  Lm for patient.

## 2020-05-25 NOTE — Telephone Encounter (Signed)
Spoke to patient in reference to scheduling ENB with Dr. Patsey Berthold after CT super D. Patient stated that he would under the impression that Dr. Mortimer Fries was going to review CT and then determine if ENB was needed. He did not wish to go ahead and schedule procedure.  Will leave message in triage to ensure f/u.  Routing to Dr. Mortimer Fries as an Juluis Rainier.

## 2020-05-25 NOTE — Telephone Encounter (Signed)
Patient is aware that CT is needed for procedure.  He has decided to go ahead and schedule ENB.  Patient is aware of below information.   ENB has been scheduled for 06/08/2020 at 12:30.  [id number] nodule.  Rodena Piety, please see procedure info.

## 2020-05-25 NOTE — Telephone Encounter (Signed)
Thank you :)

## 2020-05-26 NOTE — Telephone Encounter (Signed)
For Code [phone] Prior Josem Kaufmann is not Required Refer # [id number] [phone]

## 2020-05-26 NOTE — Telephone Encounter (Signed)
Patient is aware of below dates/times. He voiced his understanding and had no further questions.

## 2020-05-26 NOTE — Telephone Encounter (Signed)
Noted.  Will close encounter.  

## 2020-05-27 ENCOUNTER — Other Ambulatory Visit (HOSPITAL_BASED_OUTPATIENT_CLINIC_OR_DEPARTMENT_OTHER): Payer: Medicare HMO | Admitting: Internal Medicine

## 2020-05-28 ENCOUNTER — Encounter
Admission: RE | Admit: 2020-05-28 | Discharge: 2020-05-28 | Disposition: A | Discharge status: Home / Self Care | Payer: Medicare HMO | Source: Ambulatory Visit | Attending: Pulmonary Disease | Admitting: Pulmonary Disease

## 2020-05-28 ENCOUNTER — Other Ambulatory Visit: Payer: Self-pay

## 2020-05-28 HISTORY — DX: Personal history of urinary calculi: Z87.442

## 2020-05-28 HISTORY — DX: Body Mass Index (BMI) 40.0 and over, adult: Z684

## 2020-05-28 HISTORY — DX: Sepsis, unspecified organism: A41.9

## 2020-05-28 HISTORY — DX: Morbid (severe) obesity due to excess calories: E66.01

## 2020-05-28 HISTORY — DX: Pneumonia, unspecified organism: J18.9

## 2020-05-28 NOTE — Patient Instructions (Addendum)
Your procedure is scheduled on: Monday, February 7 Report to the Registration Desk on the 1st floor of the [facility]. To find out your arrival time, please call [phone] between 1PM - 3PM on: Friday, February 4  REMEMBER: Instructions that are not followed completely may result in serious medical risk, up to and including death; or upon the discretion of your surgeon and anesthesiologist your surgery may need to be rescheduled.  Do not eat food after midnight the night before surgery.  No gum chewing, lozengers or hard candies.  You may however, drink water up to 2 hours before you are scheduled to arrive for your surgery. Do not drink anything within 2 hours of your scheduled arrival time.  TAKE THESE MEDICATIONS THE MORNING OF SURGERY WITH A SIP OF WATER:  1.  Amlodipine 2.  Fluoxetine (Prozac) 3.  levetiracetam (Keppra) 4.  Levothyroxine 5.  Omeprazole - (take one the night before and one on the morning of surgery - helps to prevent nausea after surgery.) 6.  incruse ellipta inhaler 7.  Ventolin inhaler  Use inhalers on the day of surgery and bring to the hospital.  One week prior to surgery: starting January 31 Stop aspirin, Anti-inflammatories (NSAIDS) such as Advil, Aleve, Ibuprofen, Motrin, Naproxen, Naprosyn and Aspirin based products such as Excedrin, Goodys Powder, BC Powder. Stop ANY OVER THE COUNTER supplements until after surgery. Stop Vitamin C, magnesium, fish oil) (However, you may continue taking Vitamin D up until the day before surgery.)  No Alcohol for 24 hours before or after surgery.  No Smoking including e-cigarettes for 24 hours prior to surgery.  No chewable tobacco products for at least 6 hours prior to surgery.  No nicotine patches on the day of surgery.  Do not use any "recreational" drugs for at least a week prior to your surgery.  Please be advised that the combination of cocaine and anesthesia may have negative outcomes, up to and including  death. If you test positive for cocaine, your surgery will be cancelled.  On the morning of surgery brush your teeth with toothpaste and water, you may rinse your mouth with mouthwash if you wish. Do not swallow any toothpaste or mouthwash.  Do not wear jewelry, make-up, hairpins, clips or nail polish.  Do not wear lotions, powders, or perfumes.   Contact lenses, hearing aids and dentures may not be worn into surgery.  Do not bring valuables to the hospital. [facility] is not responsible for any missing/lost belongings or valuables.   Notify your doctor if there is any change in your medical condition (cold, fever, infection).  Wear comfortable clothing (specific to your surgery type) to the hospital.  If you are being discharged the day of surgery, you will not be allowed to drive home. You will need a responsible adult (18 years or older) to drive you home and stay with you that night.   If you are taking public transportation, you will need to have a responsible adult (18 years or older) with you. Please confirm with your physician that it is acceptable to use public transportation.   Please call the [facility] Dept. at [phone] if you have any questions about these instructions.  Visitation Policy:  Patients undergoing a surgery or procedure may have one family member or support person with them as long as that person is not COVID-19 positive or experiencing its symptoms.  That person may remain in the waiting area during the procedure.

## 2020-05-29 ENCOUNTER — Encounter
Admission: RE | Admit: 2020-05-29 | Discharge: 2020-05-29 | Disposition: A | Discharge status: Home / Self Care | Payer: Medicare HMO | Source: Ambulatory Visit | Attending: Pulmonary Disease | Admitting: Pulmonary Disease

## 2020-05-29 DIAGNOSIS — Z01818 Encounter for other preprocedural examination: Secondary | ICD-10-CM | POA: Insufficient documentation

## 2020-05-29 DIAGNOSIS — E119 Type 2 diabetes mellitus without complications: Secondary | ICD-10-CM | POA: Diagnosis not present

## 2020-05-29 DIAGNOSIS — I493 Ventricular premature depolarization: Secondary | ICD-10-CM | POA: Diagnosis not present

## 2020-05-29 DIAGNOSIS — I440 Atrioventricular block, first degree: Secondary | ICD-10-CM | POA: Diagnosis not present

## 2020-05-29 DIAGNOSIS — I10 Essential (primary) hypertension: Secondary | ICD-10-CM | POA: Insufficient documentation

## 2020-05-29 LAB — BASIC METABOLIC PANEL
Anion gap: 15 (ref 5–15)
BUN: 18 mg/dL (ref 8–23)
CO2: 26 mmol/L (ref 22–32)
Calcium: 10.1 mg/dL (ref 8.9–10.3)
Chloride: 101 mmol/L (ref 98–111)
Creatinine, Ser: 1.31 mg/dL — ABNORMAL HIGH (ref 0.61–1.24)
GFR, Estimated: 59 mL/min — ABNORMAL LOW (ref >60)
Glucose, Bld: 117 mg/dL — ABNORMAL HIGH (ref 70–99)
Potassium: 3.8 mmol/L (ref 3.5–5.1)
Sodium: 142 mmol/L (ref 135–145)

## 2020-05-29 LAB — CBC
HCT: 49 % (ref 39.0–52.0)
Hemoglobin: 16.5 g/dL (ref 13.0–17.0)
MCH: 32.2 pg (ref 26.0–34.0)
MCHC: 33.7 g/dL (ref 30.0–36.0)
MCV: 95.5 fL (ref 80.0–100.0)
Platelets: 167 10*3/uL (ref 150–400)
RBC: 5.13 MIL/uL (ref 4.22–5.81)
RDW: 14.3 % (ref 11.5–15.5)
WBC: 10.4 10*3/uL (ref 4.0–10.5)
nRBC: 0 % (ref 0.0–0.2)

## 2020-06-04 ENCOUNTER — Ambulatory Visit
Admission: RE | Admit: 2020-06-04 | Discharge: 2020-06-04 | Disposition: A | Discharge status: Home / Self Care | Payer: Medicare HMO | Source: Ambulatory Visit | Attending: Urology | Admitting: Urology

## 2020-06-04 ENCOUNTER — Ambulatory Visit (INDEPENDENT_AMBULATORY_CARE_PROVIDER_SITE_OTHER): Payer: Medicare HMO | Admitting: Urology

## 2020-06-04 ENCOUNTER — Encounter: Payer: Self-pay | Admitting: Urology

## 2020-06-04 ENCOUNTER — Other Ambulatory Visit: Payer: Self-pay

## 2020-06-04 ENCOUNTER — Ambulatory Visit
Admission: RE | Admit: 2020-06-04 | Discharge: 2020-06-04 | Disposition: A | Discharge status: Home / Self Care | Payer: Medicare HMO | Source: Ambulatory Visit | Attending: Internal Medicine | Admitting: Internal Medicine

## 2020-06-04 ENCOUNTER — Other Ambulatory Visit
Admission: RE | Admit: 2020-06-04 | Discharge: 2020-06-04 | Disposition: A | Discharge status: Home / Self Care | Payer: Medicare HMO | Source: Ambulatory Visit | Attending: Pulmonary Disease | Admitting: Pulmonary Disease

## 2020-06-04 VITALS — BP 128/78 | HR 69 | Ht 70.0 in | Wt 287.0 lb

## 2020-06-04 DIAGNOSIS — Z01818 Encounter for other preprocedural examination: Secondary | ICD-10-CM | POA: Insufficient documentation

## 2020-06-04 DIAGNOSIS — R918 Other nonspecific abnormal finding of lung field: Secondary | ICD-10-CM | POA: Diagnosis not present

## 2020-06-04 DIAGNOSIS — Z20822 Contact with and (suspected) exposure to covid-19: Secondary | ICD-10-CM | POA: Diagnosis not present

## 2020-06-04 DIAGNOSIS — N200 Calculus of kidney: Secondary | ICD-10-CM

## 2020-06-04 LAB — SARS CORONAVIRUS 2 (TAT 6-24 HRS): SARS Coronavirus 2: NEGATIVE

## 2020-06-04 NOTE — Progress Notes (Signed)
06/04/2020 10:38 AM   Shawn Lindsey 27-Jul-1950 [id number]  Referring provider: Leonel Ramsay, MD [place],   [id number]  Chief Complaint  Patient presents with  . Nephrolithiasis     Urologic History: -underwentstagedureteroscopy w/ laser lithotripsy for partial staghorn calculus -removed 5 mm right proximal ureteral calculus  - KUB on 08/19/2019 with persistent lower pole fragment  HPI: 70 y.o. male presents for 23-month follow-up.   States he has been doing well since his last visit.  Denies flank, abdominal or pelvic pain  Denies dysuria or gross hematuria  KUB today was reviewed and significant clearance of right lower pole fragments.  No calcifications seen along the expected course of the ureter   PMH: Past Medical History:  Diagnosis Date  . Anxiety    mild  . Cancer ([place]) 2015   tonsil  . COPD (chronic obstructive pulmonary disease) ([place])   . Diabetes mellitus without complication ([place])    controlled with diet  . Elevated cholesterol   . GERD (gastroesophageal reflux disease)   . History of kidney stones   . Hypertension   . Hypothyroidism   . Morbid obesity with BMI of 40.0-44.9, adult ([place])   . Pneumonia    klebsiella pneumoniae; extended stay in hospital requiring tracheotomy and gastrotomy  . Seizures ([place]) 2015   related to sepsis  . Septic shock ([place])   . Sleep apnea 2019   has a Cpap machime   . Stroke [facility]) 2007   no peripheral vision on left side of both eyes & left arm gets numb under stress    Surgical History: Past Surgical History:  Procedure Laterality Date  . CYSTOSCOPY W/ RETROGRADES Right 07/23/2019   Procedure: CYSTOSCOPY WITH RETROGRADE PYELOGRAM;  Surgeon: Abbie Sons, MD;  Location: [facility] ORS;  Service: Urology;  Laterality: Right;  . CYSTOSCOPY WITH STENT PLACEMENT Right 07/05/2019   Procedure: CYSTOSCOPY WITH STENT PLACEMENT;  Surgeon: Abbie Sons, MD;  Location: [facility] ORS;  Service:  Urology;  Laterality: Right;  . CYSTOSCOPY/URETEROSCOPY/HOLMIUM LASER/STENT PLACEMENT Right 07/23/2019   Procedure: CYSTOSCOPY/URETEROSCOPY/HOLMIUM LASER/STENT Exchange;  Surgeon: Abbie Sons, MD;  Location: [facility] ORS;  Service: Urology;  Laterality: Right;  . CYSTOSCOPY/URETEROSCOPY/HOLMIUM LASER/STENT PLACEMENT Right 07/30/2019   Procedure: CYSTOSCOPY/URETEROSCOPY/HOLMIUM LASER/STENT Exchange;  Surgeon: Abbie Sons, MD;  Location: [facility] ORS;  Service: Urology;  Laterality: Right;  . ELECTROMAGNETIC NAVIGATION BROCHOSCOPY N/A 03/09/2016   Procedure: ELECTROMAGNETIC NAVIGATION BRONCHOSCOPY;  Surgeon: Flora Lipps, MD;  Location: [facility] ORS;  Service: Cardiopulmonary;  Laterality: N/A;  . GASTROSTOMY TUBE PLACEMENT    . REMOVAL OF GASTROSTOMY TUBE    . TONSILLECTOMY  2015  . TRACHEOSTOMY      Home Medications:  Allergies as of 06/04/2020   No Known Allergies     Medication List       Accurate as of June 04, 2020 10:38 AM. If you have any questions, ask your nurse or doctor.        acetaminophen 500 MG tablet Commonly known as: TYLENOL Take 500 mg by mouth every 4 (four) hours as needed for moderate pain.   amLODipine 5 MG tablet Commonly known as: NORVASC Take 5 mg by mouth daily.   aspirin 81 MG EC tablet Take 1 tablet (81 mg total) by mouth daily.   atorvastatin 80 MG tablet Commonly known as: LIPITOR Take 80 mg by mouth daily at 6 PM.   Cholecalciferol 50 MCG (2000 UT) Caps Take 2,000 Units by mouth daily.  Fish Oil 1000 MG Caps Take 1,000 mg by mouth daily.   FLUoxetine 10 MG capsule Commonly known as: PROZAC Take 10 mg by mouth daily.   gabapentin 600 MG tablet Commonly known as: NEURONTIN Take 600 mg by mouth at bedtime.   levETIRAcetam 500 MG tablet Commonly known as: KEPPRA Take 500 mg by mouth 2 (two) times daily.   levothyroxine 125 MCG tablet Commonly known as: SYNTHROID Take 125 mcg by mouth daily before breakfast.   magnesium oxide 400 MG  tablet Commonly known as: MAG-OX Take 400 mg by mouth 2 (two) times daily.   omeprazole 20 MG capsule Commonly known as: PRILOSEC Take 20 mg by mouth daily.   umeclidinium bromide 62.5 MCG/INH Aepb Commonly known as: INCRUSE ELLIPTA Inhale 1 puff into the lungs daily.   Ventolin HFA 108 (90 Base) MCG/ACT inhaler Generic drug: albuterol Inhale 1-2 puffs into the lungs every 6 (six) hours as needed (for shortness of breath/wheezing.).   VITAMIN C PO Take 1 tablet by mouth daily in the afternoon.       Allergies: No Known Allergies  Family History: Family History  Problem Relation Age of Onset  . Diabetes Brother   . Dementia Mother     Social History:  reports that he has been smoking cigarettes. He has a 40.00 pack-year smoking history. He has never used smokeless tobacco. He reports that he does not drink alcohol and does not use drugs.   Physical Exam: There were no vitals taken for this visit.  Constitutional:  Alert and oriented, No acute distress. HEENT: [place] AT, moist mucus membranes.  Trachea midline, no masses. Cardiovascular: No clubbing, cyanosis, or edema. Respiratory: Normal respiratory effort, no increased work of breathing. Skin: No rashes, bruises or suspicious lesions. Neurologic: Grossly intact, no focal deficits, moving all 4 extremities. Psychiatric: Normal mood and affect.  Imaging:  KUB personally reviewed and interpreted   Assessment & Plan:    1.  Nephrolithiasis  Significant clearance of right lower pole fragments since last KUB  Follow-up 6 months with repeat KUB then annually if stable   Abbie Sons, MD  [facility] [facility] [address] [place], [place] [address] [phone]

## 2020-06-07 MED ORDER — CHLORHEXIDINE GLUCONATE 0.12 % MT SOLN
15.0000 mL | Freq: Once | OROMUCOSAL | Status: AC
  Administered 2020-06-08: 15 mL via OROMUCOSAL

## 2020-06-07 MED ORDER — SODIUM CHLORIDE 0.9 % IV SOLN: Freq: Once | INTRAVENOUS | Status: DC

## 2020-06-07 MED ORDER — ORAL CARE MOUTH RINSE: 15.0000 mL | Freq: Once | OROMUCOSAL | Status: AC

## 2020-06-07 MED ORDER — SODIUM CHLORIDE 0.9 % IV SOLN: INTRAVENOUS | Status: DC

## 2020-06-07 MED ORDER — BUTAMBEN-TETRACAINE-BENZOCAINE 2-2-14 % EX AERO
1.0000 | INHALATION_SPRAY | Freq: Once | CUTANEOUS | Status: DC
  Filled 2020-06-07: qty 20

## 2020-06-08 ENCOUNTER — Ambulatory Visit: Payer: Medicare HMO

## 2020-06-08 ENCOUNTER — Encounter: Payer: Self-pay | Admitting: Pulmonary Disease

## 2020-06-08 ENCOUNTER — Encounter
Admission: RE | Disposition: A | Discharge status: Home / Self Care | Payer: Self-pay | Source: Home / Self Care | Attending: Pulmonary Disease

## 2020-06-08 ENCOUNTER — Other Ambulatory Visit: Payer: Self-pay

## 2020-06-08 ENCOUNTER — Ambulatory Visit
Admission: RE | Admit: 2020-06-08 | Discharge: 2020-06-08 | Disposition: A | Discharge status: Home / Self Care | Payer: Medicare HMO | Attending: Pulmonary Disease | Admitting: Pulmonary Disease

## 2020-06-08 ENCOUNTER — Ambulatory Visit: Payer: Medicare HMO | Admitting: Anesthesiology

## 2020-06-08 DIAGNOSIS — Z6841 Body Mass Index (BMI) 40.0 and over, adult: Secondary | ICD-10-CM | POA: Insufficient documentation

## 2020-06-08 DIAGNOSIS — Z8673 Personal history of transient ischemic attack (TIA), and cerebral infarction without residual deficits: Secondary | ICD-10-CM | POA: Diagnosis not present

## 2020-06-08 DIAGNOSIS — J9611 Chronic respiratory failure with hypoxia: Secondary | ICD-10-CM | POA: Diagnosis not present

## 2020-06-08 DIAGNOSIS — Z8589 Personal history of malignant neoplasm of other organs and systems: Secondary | ICD-10-CM | POA: Insufficient documentation

## 2020-06-08 DIAGNOSIS — Z9889 Other specified postprocedural states: Secondary | ICD-10-CM | POA: Diagnosis not present

## 2020-06-08 DIAGNOSIS — R911 Solitary pulmonary nodule: Secondary | ICD-10-CM

## 2020-06-08 DIAGNOSIS — Z9119 Patient's noncompliance with other medical treatment and regimen: Secondary | ICD-10-CM | POA: Diagnosis not present

## 2020-06-08 DIAGNOSIS — E669 Obesity, unspecified: Secondary | ICD-10-CM | POA: Diagnosis not present

## 2020-06-08 DIAGNOSIS — Z79899 Other long term (current) drug therapy: Secondary | ICD-10-CM | POA: Insufficient documentation

## 2020-06-08 DIAGNOSIS — R918 Other nonspecific abnormal finding of lung field: Secondary | ICD-10-CM | POA: Diagnosis present

## 2020-06-08 DIAGNOSIS — Z7982 Long term (current) use of aspirin: Secondary | ICD-10-CM | POA: Insufficient documentation

## 2020-06-08 DIAGNOSIS — Z923 Personal history of irradiation: Secondary | ICD-10-CM | POA: Diagnosis not present

## 2020-06-08 DIAGNOSIS — Z9221 Personal history of antineoplastic chemotherapy: Secondary | ICD-10-CM | POA: Diagnosis not present

## 2020-06-08 DIAGNOSIS — F17210 Nicotine dependence, cigarettes, uncomplicated: Secondary | ICD-10-CM | POA: Diagnosis not present

## 2020-06-08 DIAGNOSIS — G4733 Obstructive sleep apnea (adult) (pediatric): Secondary | ICD-10-CM | POA: Diagnosis not present

## 2020-06-08 DIAGNOSIS — C099 Malignant neoplasm of tonsil, unspecified: Secondary | ICD-10-CM

## 2020-06-08 DIAGNOSIS — Z79890 Hormone replacement therapy (postmenopausal): Secondary | ICD-10-CM | POA: Insufficient documentation

## 2020-06-08 DIAGNOSIS — J449 Chronic obstructive pulmonary disease, unspecified: Secondary | ICD-10-CM | POA: Insufficient documentation

## 2020-06-08 HISTORY — PX: VIDEO BRONCHOSCOPY WITH ENDOBRONCHIAL NAVIGATION: SHX6175

## 2020-06-08 HISTORY — PX: VIDEO BRONCHOSCOPY WITH ENDOBRONCHIAL ULTRASOUND: SHX6177

## 2020-06-08 LAB — GLUCOSE, CAPILLARY
Glucose-Capillary: 135 mg/dL — ABNORMAL HIGH (ref 70–99)
Glucose-Capillary: 116 mg/dL — ABNORMAL HIGH (ref 70–99)

## 2020-06-08 SURGERY — VIDEO BRONCHOSCOPY WITH ENDOBRONCHIAL NAVIGATION
Anesthesia: General

## 2020-06-08 MED ORDER — EPHEDRINE 5 MG/ML INJ
INTRAVENOUS | Status: AC
  Filled 2020-06-08: qty 10

## 2020-06-08 MED ORDER — LIDOCAINE HCL (PF) 2 % IJ SOLN
INTRAMUSCULAR | Status: AC
  Filled 2020-06-08: qty 5

## 2020-06-08 MED ORDER — ONDANSETRON HCL 4 MG/2ML IJ SOLN
INTRAMUSCULAR | Status: AC
  Filled 2020-06-08: qty 2

## 2020-06-08 MED ORDER — FENTANYL CITRATE (PF) 100 MCG/2ML IJ SOLN
INTRAMUSCULAR | Status: DC | PRN
  Administered 2020-06-08: 100 ug via INTRAVENOUS

## 2020-06-08 MED ORDER — FENTANYL CITRATE (PF) 100 MCG/2ML IJ SOLN
INTRAMUSCULAR | Status: AC
  Filled 2020-06-08: qty 2

## 2020-06-08 MED ORDER — PROPOFOL 10 MG/ML IV BOLUS
INTRAVENOUS | Status: AC
  Filled 2020-06-08: qty 20

## 2020-06-08 MED ORDER — EPHEDRINE SULFATE 50 MG/ML IJ SOLN
INTRAMUSCULAR | Status: DC | PRN
  Administered 2020-06-08: 5 mg via INTRAVENOUS
  Administered 2020-06-08: 10 mg via INTRAVENOUS
  Administered 2020-06-08: 7.5 mg via INTRAVENOUS
  Administered 2020-06-08: 5 mg via INTRAVENOUS

## 2020-06-08 MED ORDER — SUCCINYLCHOLINE CHLORIDE 200 MG/10ML IV SOSY
PREFILLED_SYRINGE | INTRAVENOUS | Status: AC
  Filled 2020-06-08: qty 10

## 2020-06-08 MED ORDER — LIDOCAINE HCL (CARDIAC) PF 100 MG/5ML IV SOSY
PREFILLED_SYRINGE | INTRAVENOUS | Status: DC | PRN
  Administered 2020-06-08: 100 mg via INTRAVENOUS

## 2020-06-08 MED ORDER — PHENYLEPHRINE HCL (PRESSORS) 10 MG/ML IV SOLN
INTRAVENOUS | Status: AC
  Filled 2020-06-08: qty 1

## 2020-06-08 MED ORDER — ROCURONIUM BROMIDE 100 MG/10ML IV SOLN
INTRAVENOUS | Status: DC | PRN
  Administered 2020-06-08: 30 mg via INTRAVENOUS
  Administered 2020-06-08: 10 mg via INTRAVENOUS

## 2020-06-08 MED ORDER — SUGAMMADEX SODIUM 500 MG/5ML IV SOLN
INTRAVENOUS | Status: DC | PRN
  Administered 2020-06-08: 500 mg via INTRAVENOUS

## 2020-06-08 MED ORDER — FENTANYL CITRATE (PF) 100 MCG/2ML IJ SOLN: 25.0000 ug | INTRAMUSCULAR | Status: DC | PRN

## 2020-06-08 MED ORDER — DEXAMETHASONE SODIUM PHOSPHATE 10 MG/ML IJ SOLN
INTRAMUSCULAR | Status: DC | PRN
  Administered 2020-06-08: 10 mg via INTRAVENOUS

## 2020-06-08 MED ORDER — LIDOCAINE HCL 4 % MT SOLN
OROMUCOSAL | Status: DC | PRN
  Administered 2020-06-08: 4 mL via TOPICAL

## 2020-06-08 MED ORDER — OXYCODONE HCL 5 MG PO TABS: 5.0000 mg | ORAL_TABLET | Freq: Once | ORAL | Status: DC | PRN

## 2020-06-08 MED ORDER — PROPOFOL 10 MG/ML IV BOLUS
INTRAVENOUS | Status: DC | PRN
  Administered 2020-06-08: 150 mg via INTRAVENOUS

## 2020-06-08 MED ORDER — ONDANSETRON HCL 4 MG/2ML IJ SOLN
INTRAMUSCULAR | Status: DC | PRN
  Administered 2020-06-08: 4 mg via INTRAVENOUS

## 2020-06-08 MED ORDER — SUCCINYLCHOLINE CHLORIDE 20 MG/ML IJ SOLN
INTRAMUSCULAR | Status: DC | PRN
  Administered 2020-06-08: 140 mg via INTRAVENOUS

## 2020-06-08 MED ORDER — ACETAMINOPHEN 10 MG/ML IV SOLN: 1000.0000 mg | Freq: Once | INTRAVENOUS | Status: DC | PRN

## 2020-06-08 MED ORDER — ONDANSETRON HCL 4 MG/2ML IJ SOLN: 4.0000 mg | Freq: Once | INTRAMUSCULAR | Status: DC | PRN

## 2020-06-08 MED ORDER — DEXAMETHASONE SODIUM PHOSPHATE 10 MG/ML IJ SOLN
INTRAMUSCULAR | Status: AC
  Filled 2020-06-08: qty 1

## 2020-06-08 MED ORDER — SUGAMMADEX SODIUM 500 MG/5ML IV SOLN
INTRAVENOUS | Status: AC
  Filled 2020-06-08: qty 5

## 2020-06-08 MED ORDER — OXYCODONE HCL 5 MG/5ML PO SOLN: 5.0000 mg | Freq: Once | ORAL | Status: DC | PRN

## 2020-06-08 MED ORDER — CHLORHEXIDINE GLUCONATE 0.12 % MT SOLN
OROMUCOSAL | Status: AC
  Filled 2020-06-08: qty 15

## 2020-06-08 MED ORDER — ROCURONIUM BROMIDE 10 MG/ML (PF) SYRINGE
PREFILLED_SYRINGE | INTRAVENOUS | Status: AC
  Filled 2020-06-08: qty 10

## 2020-06-08 NOTE — Anesthesia Postprocedure Evaluation (Signed)
Anesthesia Post Note  Patient: SULAYMAN MANNING  Procedure(s) Performed: VIDEO BRONCHOSCOPY WITH ENDOBRONCHIAL NAVIGATION (N/A ) VIDEO BRONCHOSCOPY WITH ENDOBRONCHIAL ULTRASOUND (N/A )  Patient location during evaluation: PACU Anesthesia Type: General Level of consciousness: awake and alert Pain management: pain level controlled Vital Signs Assessment: post-procedure vital signs reviewed and stable Respiratory status: spontaneous breathing, nonlabored ventilation and respiratory function stable Cardiovascular status: blood pressure returned to baseline and stable Postop Assessment: no apparent nausea or vomiting Anesthetic complications: no   No complications documented.   Last Vitals:  Vitals:   06/08/20 1500 06/08/20 1503  BP:  127/69  Pulse:  74  Resp:  18  Temp:  (!) 35.8 C  SpO2: 94% 95%    Last Pain:  Vitals:   06/08/20 1503  TempSrc: Temporal  PainSc: 0-No pain                 [name]

## 2020-06-08 NOTE — Anesthesia Procedure Notes (Signed)
Procedure Name: Intubation Date/Time: 06/08/2020 12:55 PM Performed by: Hedda Slade, CRNA Pre-anesthesia Checklist: Patient identified, Patient being monitored, Timeout performed, Emergency Drugs available and Suction available Patient Re-evaluated:Patient Re-evaluated prior to induction Oxygen Delivery Method: Circle system utilized Preoxygenation: Pre-oxygenation with 100% oxygen Induction Type: IV induction Ventilation: Mask ventilation without difficulty Laryngoscope Size: McGraph and 4 Grade View: Grade I Tube type: Oral Tube size: 8.5 mm Number of attempts: 1 Airway Equipment and Method: Stylet and Video-laryngoscopy Placement Confirmation: ETT inserted through vocal cords under direct vision,  positive ETCO2 and breath sounds checked- equal and bilateral Secured at: 22 cm Tube secured with: Tape Dental Injury: Teeth and Oropharynx as per pre-operative assessment

## 2020-06-08 NOTE — OR Nursing (Signed)
Per Dr. Patsey Berthold, secure chat - patient may resume taking aspirin tomorrow; added to d/c instructions/med section.

## 2020-06-08 NOTE — Discharge Instructions (Signed)

## 2020-06-08 NOTE — Interval H&P Note (Signed)
History and Physical Interval Note:  The patient has been seen and examined. Case had been discussed with Dr. Flora Lipps previously. Also studies reviewed. Patient was aware procedure was to be done by me. Interviewed him prior to the procedure and he is doing well. No respiratory complaint. No chest pain. History is as noted above on Dr. Zoila Shutter note.  GENERAL: Obese gentleman, no acute distress. Supine in stretcher without tachypnea. HEAD: Normocephalic, atraumatic.  EYES: Pupils equal, round, reactive to light.  No scleral icterus.  MOUTH: Nose/mouth/throat not examined due to masking requirements for COVID 19. NECK: Supple. No thyromegaly. Trachea midline. No JVD.  No adenopathy. PULMONARY: Good air entry bilaterally.  No adventitious sounds. CARDIOVASCULAR: S1 and S2. Regular rate and rhythm.  ABDOMEN: Obese otherwise benign. MUSCULOSKELETAL: No joint deformity, no clubbing, no edema.  NEUROLOGIC: No focal deficits. SKIN: Intact,warm,dry. PSYCH: Mood and behavior normal.  06/08/2020 12:09 PM  [facility] Nurse  has presented today for surgery, with the diagnosis of LUNG NODULE.  The various methods of treatment have been discussed with the patient and family. After consideration of risks, benefits and other options for treatment, the patient has consented to   as a surgical intervention.  Procedures: Bronchoscopy with electro navigation assistance for evaluation of RIGHT mid lung new lung lesion and RIGHT upper lung lesion.  Benefits, limitations and potential complications of the procedure were discussed with the patient/family  including, but not limited to bleeding, hemoptysis, respiratory failure requiring intubation and/or prolongued mechanical ventilation, infection, pneumothorax (collapse of lung) requiring chest tube placement, stroke or even death.  The patient's history has been reviewed, patient examined, no change in status, stable for surgery.  I have reviewed the patient's  chart and labs.  Questions were answered to the patient's satisfaction.     Renold Don, MD [facility]   *This note was dictated using voice recognition software/Dragon.  Despite best efforts to proofread, errors can occur which can change the meaning.  Any change was purely unintentional.

## 2020-06-08 NOTE — Op Note (Signed)
Electromagnetic Navigation Bronchoscopy: Indication: Right Middle Lobe Lung Mass/Nodule  Preoperative Diagnosis: Right middle lobe lung nodule/mass Post Procedure Diagnosis same as above, rule out CA Consent: Verbal/Written  Benefits, limitations and potential complications of the procedure were discussed with the patient/family  including, but not limited to bleeding, hemoptysis, respiratory failure requiring intubation and/or prolongued mechanical ventilation, infection, pneumothorax (collapse of lung) requiring chest tube placement, stroke or even death.  Patient agreed to proceed.  Surgeon: Renold Don, MD Assistant/Scrub: Sullivan Lone, RRT Anesthesiologist/CRNA: Arita Miss, MD/Deana Lorenza Chick, CRNA ROSE available: Yes, [facility] Fluoroscopy technician: Waverly Ferrari, RTR   Type of Anesthesia: General endotracheal   Description of Procedure:  Procedure Performed: 1) virtual Bronchoscopy with Multi-planar Image analysis, 3-D reconstruction of coronal, sagittal and multi-planar images for the purposes of planning real-time bronchoscopy using the iLogic Electromagnetic Navigation Bronchoscopy System (superDimension).  Fluoroscopy as needed for sample acquisition.  Description of Procedure: The patient was taken to Procedure Room 2 (Bronchoscopy Suite) appropriate timeout was taken.  Patient was placed on the superDimension table.  Patient was then inducted under general anesthesia by the anesthesia team.  She was intubated with an #8.5 ETT without difficulty.  A Portex adapter was placed on the ETT flange.  At this point the Olympus video bronchoscope was advanced through the Portex adapter and anatomic tour of the airway was performed.  Patient had copious secretions throughout that were suctioned and lavaged until clear.  The visible trachea was normal, carina was sharp, inspection of the right lung showed no abnormalities or endobronchial lesions on the right upper lobe, right middle lobe  and right lower lobe subsegments.  Inspection of the left lung showed no endobronchial lesions, some airway friability on left upper lobe, lingula and lower lobe subsegments.  At this point the superDimension extended working channel and locatable guide (LG) were advanced through the working channel of the bronchoscope.The catheter/LG combo was then placed into the central portion of the trachea. The LG was directed to standard registration points at the following centers: main carina, right upper lobe bronchus, right lower lobe bronchus, right middle lobe bronchus, left upper lobe bronchus, and the left lower lobe bronchus. This data was transferred to the i-Logic ENB system for real-time bronchoscopy.  Once registration was completed, the scope was advanced until it could go no further and then the extended working channel/LG combo was navigated to the RIGHT middle lobe target for tissue sampling.  The position of the LG was confirmed with fluoroscopy.  Once this was done LG probe was removed and transbronchial brushings were performed under fluoroscopic guidance.  These sampling was bloody and full of debris.    ROSE revealed inflammatory cells, additional brushings were performed a total of 2 brushes were utilized for a total of 4 passes.  In similar fashion transbronchial biopsies were performed with superDimension biopsy forceps.  A total of 4 biopsy specimens were obtained of the RIGHT middle  lobe lesion.  Biopsies were limited due to friability of the tissue.  After this was completed a targeted bronchoalveolar lavage was performed on the RIGHT middle lobe, 40 mL of saline were instilled yielding approximately 10 mL of aliquot.  Once this was completed the airway was examined for hemostasis and the patient received 9 mL of 1% lidocaine via bronchial lavage prior to retrieving the bronchoscope.  Bronchoscope was then retrieved and the patient was allowed to emerge from general anesthesia, was extubated and  was transferred to the PACU in satisfactory condition.  Patient  tolerated procedure well.  No immediate complications noted.  Specimens Obtained:  Transbronchial brushings, x4, total of 2 brushes used.  Transbronchial forceps biopsies x4  Bronchoalveolar lavage: 40 mL in , 10 mL out  Transbronchial  Needle, GEN cut and regular: Total of 4 passes.  Fluoroscopy:  Fluoroscopy was utilized during the course of this procedure to assure that biopsies were taken in a safe manner under fluoroscopic guidance with spot films required.     Complications:None Postprocedural chest x-ray showed no pneumothorax      Estimated Blood Loss: minimal approx less than 5 mL.    Assessment and Plan/Additional Comments:  1) Right middle lobe lesion, inflammatory versus cancer 2) follow-up pathology reports.   Renold Don, MD [facility]   *This note was dictated using voice recognition software/Dragon.  Despite best efforts to proofread, errors can occur which can change the meaning.  Any change was purely unintentional.

## 2020-06-08 NOTE — Transfer of Care (Signed)
Immediate Anesthesia Transfer of Care Note  Patient: Shawn Lindsey  Procedure(s) Performed: VIDEO BRONCHOSCOPY WITH ENDOBRONCHIAL NAVIGATION (N/A ) VIDEO BRONCHOSCOPY WITH ENDOBRONCHIAL ULTRASOUND (N/A )  Patient Location: PACU  Anesthesia Type:General  Level of Consciousness: awake, alert  and oriented  Airway & Oxygen Therapy: Patient Spontanous Breathing  Post-op Assessment: Report given to RN and Post -op Vital signs reviewed and stable  Post vital signs: Reviewed and stable  Last Vitals:  Vitals Value Taken Time  BP 134/84 06/08/20 1413  Temp    Pulse 79 06/08/20 1413  Resp 20 06/08/20 1413  SpO2 93 % 06/08/20 1413  Vitals shown include unvalidated device data.  Last Pain:  Vitals:   06/08/20 1413  TempSrc:   PainSc: 0-No pain         Complications: No complications documented.

## 2020-06-08 NOTE — Anesthesia Preprocedure Evaluation (Signed)
Anesthesia Evaluation  Patient identified by MRN, date of birth, ID band Patient awake    Reviewed: Allergy & Precautions, NPO status , Patient's Chart, lab work & pertinent test results  History of Anesthesia Complications Negative for: history of anesthetic complications  Airway Mallampati: II  TM Distance: <3 FB Neck ROM: limited    Dental  (+) Edentulous Lower, Edentulous Upper   Pulmonary neg shortness of breath, sleep apnea (has CPAP but does not wear it) , pneumonia, COPD,  COPD inhaler, Current SmokerPatient did not abstain from smoking.,    breath sounds clear to auscultation- rhonchi (-) wheezing      Cardiovascular Exercise Tolerance: Good hypertension, Pt. on medications (-) CAD, (-) Past MI, (-) Cardiac Stents and (-) CABG  Rhythm:Regular Rate:Normal - Systolic murmurs and - Diastolic murmurs    Neuro/Psych Seizures - (one seizure in setting of septic pnemonia),  Anxiety  Neuromuscular disease CVA (occasional L arm numbness)    GI/Hepatic Neg liver ROS, GERD  ,  Endo/Other  diabetes, Oral Hypoglycemic AgentsHypothyroidism Morbid obesity  Renal/GU Renal InsufficiencyRenal disease (nephrolithiasis)     Musculoskeletal negative musculoskeletal ROS (+)   Abdominal (+) + obese,   Peds  Hematology negative hematology ROS (+)   Anesthesia Other Findings Past Medical History: No date: Anxiety     Comment:  mild 2015: 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 No date: COPD (chronic obstructive pulmonary disease) (HCC) No date: Diabetes mellitus without complication (HCC) No date: Elevated cholesterol No date: GERD (gastroesophageal reflux disease) No date: Hypertension No date: Hypothyroidism 2015: Seizures ([place])     Comment:  related to sepsis 2019: Sleep apnea     Comment:  has a Cpap machime  2007: Stroke ([place])     Comment:  no peripheral vision on left side of both eyes & left               arm gets numb  under stress   Reproductive/Obstetrics                             Anesthesia Physical  Anesthesia Plan  ASA: III  Anesthesia Plan: General   Post-op Pain Management:    Induction: Intravenous  PONV Risk Score and Plan: 1 and Ondansetron and Dexamethasone  Airway Management Planned: Oral ETT and Video Laryngoscope Planned  Additional Equipment: None  Intra-op Plan:   Post-operative Plan: Extubation in OR  Informed Consent: I have reviewed the patients History and Physical, chart, labs and discussed the procedure including the risks, benefits and alternatives for the proposed anesthesia with the patient or authorized representative who has indicated his/her understanding and acceptance.     Dental advisory given  Plan Discussed with: CRNA and Anesthesiologist  Anesthesia Plan Comments: (Patient consented for risks of anesthesia including but not limited to:  - adverse reactions to medications - damage to teeth, lips or other oral mucosa - sore throat or hoarseness - Damage to heart, brain, lungs or loss of life  Patient voiced understanding.)        Anesthesia Quick Evaluation

## 2020-06-09 LAB — SURGICAL PATHOLOGY

## 2020-06-09 LAB — CYTOLOGY - NON PAP

## 2020-06-15 ENCOUNTER — Telehealth: Payer: Self-pay | Admitting: Pulmonary Disease

## 2020-06-15 NOTE — Telephone Encounter (Signed)
Patient is calling for bronch results.   Dr. Mortimer Fries, please advise.

## 2020-06-16 MED ORDER — CLARITHROMYCIN 500 MG PO TABS: 500.0000 mg | ORAL_TABLET | Freq: Two times a day (BID) | ORAL | 0 refills | Status: AC

## 2020-06-16 MED ORDER — PREDNISONE 10 MG (21) PO TBPK: ORAL_TABLET | ORAL | 0 refills | Status: DC

## 2020-06-16 NOTE — Telephone Encounter (Signed)
Results have been relayed to patient by Dr. Patsey Berthold via telephone.  Please see 05/25/2020 result note.

## 2020-06-24 ENCOUNTER — Telehealth: Payer: Self-pay | Admitting: Internal Medicine

## 2020-06-24 MED ORDER — SPIRIVA RESPIMAT 2.5 MCG/ACT IN AERS
2.0000 | INHALATION_SPRAY | Freq: Every day | RESPIRATORY_TRACT | 11 refills | Status: DC
Start: 2020-06-24 — End: 2021-06-04

## 2020-06-24 NOTE — Telephone Encounter (Signed)
Patient is returning phone call. Patient phone number is [phone].

## 2020-06-24 NOTE — Telephone Encounter (Signed)
Rx for Spiriva 2.5 has been sent to preferred pharmacy.  Patient is aware and voiced his understanding.  Nothing further needed at this time.

## 2020-06-24 NOTE — Telephone Encounter (Signed)
Received drug change request from [facility]. Incruse is not covered by insurance. Covered alternative is spiriva respimat or handihaler.   Dr. Mortimer Fries, please advise. Thanks

## 2020-06-24 NOTE — Telephone Encounter (Signed)
Lm for patient.  

## 2020-06-24 NOTE — Telephone Encounter (Signed)
Respimat 2.5 please

## 2020-08-12 DIAGNOSIS — Z8709 Personal history of other diseases of the respiratory system: Secondary | ICD-10-CM | POA: Diagnosis not present

## 2020-08-12 DIAGNOSIS — J449 Chronic obstructive pulmonary disease, unspecified: Secondary | ICD-10-CM | POA: Diagnosis not present

## 2020-08-12 DIAGNOSIS — K122 Cellulitis and abscess of mouth: Secondary | ICD-10-CM | POA: Diagnosis not present

## 2020-08-12 DIAGNOSIS — E119 Type 2 diabetes mellitus without complications: Secondary | ICD-10-CM | POA: Diagnosis not present

## 2020-09-16 DIAGNOSIS — Z0000 Encounter for general adult medical examination without abnormal findings: Secondary | ICD-10-CM | POA: Diagnosis not present

## 2020-09-16 DIAGNOSIS — F17210 Nicotine dependence, cigarettes, uncomplicated: Secondary | ICD-10-CM | POA: Diagnosis not present

## 2020-09-16 DIAGNOSIS — E1142 Type 2 diabetes mellitus with diabetic polyneuropathy: Secondary | ICD-10-CM | POA: Diagnosis not present

## 2020-09-16 DIAGNOSIS — F32A Depression, unspecified: Secondary | ICD-10-CM | POA: Diagnosis not present

## 2020-09-16 DIAGNOSIS — G40909 Epilepsy, unspecified, not intractable, without status epilepticus: Secondary | ICD-10-CM | POA: Diagnosis not present

## 2020-09-16 DIAGNOSIS — E039 Hypothyroidism, unspecified: Secondary | ICD-10-CM | POA: Diagnosis not present

## 2020-09-16 DIAGNOSIS — J449 Chronic obstructive pulmonary disease, unspecified: Secondary | ICD-10-CM | POA: Diagnosis not present

## 2020-09-16 DIAGNOSIS — Z6841 Body Mass Index (BMI) 40.0 and over, adult: Secondary | ICD-10-CM | POA: Diagnosis not present

## 2020-09-16 DIAGNOSIS — E669 Obesity, unspecified: Secondary | ICD-10-CM | POA: Diagnosis not present

## 2020-12-02 ENCOUNTER — Ambulatory Visit: Payer: Self-pay | Admitting: Urology

## 2020-12-03 DIAGNOSIS — Z6841 Body Mass Index (BMI) 40.0 and over, adult: Secondary | ICD-10-CM | POA: Diagnosis not present

## 2020-12-03 DIAGNOSIS — I700 Atherosclerosis of aorta: Secondary | ICD-10-CM | POA: Diagnosis not present

## 2020-12-03 DIAGNOSIS — R197 Diarrhea, unspecified: Secondary | ICD-10-CM | POA: Diagnosis not present

## 2020-12-03 DIAGNOSIS — R058 Other specified cough: Secondary | ICD-10-CM | POA: Diagnosis not present

## 2020-12-03 DIAGNOSIS — E1142 Type 2 diabetes mellitus with diabetic polyneuropathy: Secondary | ICD-10-CM | POA: Diagnosis not present

## 2020-12-03 DIAGNOSIS — R918 Other nonspecific abnormal finding of lung field: Secondary | ICD-10-CM | POA: Diagnosis not present

## 2020-12-03 DIAGNOSIS — E669 Obesity, unspecified: Secondary | ICD-10-CM | POA: Diagnosis not present

## 2020-12-03 DIAGNOSIS — J449 Chronic obstructive pulmonary disease, unspecified: Secondary | ICD-10-CM | POA: Diagnosis not present

## 2020-12-24 DIAGNOSIS — R059 Cough, unspecified: Secondary | ICD-10-CM | POA: Diagnosis not present

## 2020-12-30 ENCOUNTER — Other Ambulatory Visit: Payer: Self-pay

## 2020-12-30 NOTE — Telephone Encounter (Signed)
Received PA from [facility] for the RX incruse. Patient states he is no longer using incruse and he is using spiriva and it works well for him, so no change in medication is needed. Patient also states his pharmacy changed to [facility] in [place] I switched over his pharmacies.  Nothing further needed.

## 2021-01-05 ENCOUNTER — Other Ambulatory Visit: Payer: Self-pay

## 2021-01-05 ENCOUNTER — Encounter: Payer: Self-pay | Admitting: Adult Health

## 2021-01-05 ENCOUNTER — Ambulatory Visit: Payer: Medicare HMO | Admitting: Adult Health

## 2021-01-05 VITALS — BP 130/64 | HR 80 | Temp 98.3°F | Ht 70.0 in | Wt 309.0 lb

## 2021-01-05 DIAGNOSIS — R918 Other nonspecific abnormal finding of lung field: Secondary | ICD-10-CM

## 2021-01-05 DIAGNOSIS — J449 Chronic obstructive pulmonary disease, unspecified: Secondary | ICD-10-CM

## 2021-01-05 DIAGNOSIS — Z23 Encounter for immunization: Secondary | ICD-10-CM

## 2021-01-05 NOTE — Assessment & Plan Note (Signed)
GGO in RUL , s/p Navigational Bronch 06/2020  -cytology /path non diagnostic  Needs follow up CT chest

## 2021-01-05 NOTE — Assessment & Plan Note (Signed)
Compensated on present regimen  Recent flare - resolved with abx and steroids  Smoking cessation discussed   Plan  Patient Instructions  Continue on Spiriva daily  Activity as tolerated.  Flu shot today .  Set up for CT chest  Work on not smoking.  Follow up with Dr. Mortimer Fries in 4 months and As needed

## 2021-01-05 NOTE — Patient Instructions (Addendum)
Continue on Spiriva daily  Activity as tolerated.  Flu shot today .  Set up for CT chest  Work on not smoking.  Follow up with Dr. Mortimer Fries in 4 months and As needed

## 2021-01-05 NOTE — Progress Notes (Signed)
@Patient  ID: Glade Nurse, male    DOB: 09-Jul-1950, 70 y.o.   MRN: [id number]  Chief Complaint  Patient presents with   Follow-up    Referring provider: Leonel Ramsay, MD  HPI: 70 year old male active smoker followed for COPD, lung mass Medical history significant for sleep apnea CPAP noncompliant.  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  Previously 5.4 x 2.5 x 3.5 cm.   Repeat CT chest 01/05/16 shows RUL cavitary mass 4.8x4.1 CM, previous measurement 4.6 x 3.8CM Repeat CT chest 08/2016 no significant change in size of RUL mass    CT chest 03/2020 Significant interval-increase in a dense, masslike irregular consolidation primarily involving the right upper lobe, with architectural distortion and volume loss, the dominant component now measuring approximately 7.4 x 7.1 cm, previously 5.4 x 4.1 cm when measured most similarly There are new and enlarged satellite nodules in the right upper lobe new and enlarged nodules in the right middle lobe, the largest in the lateral segment measuring 2.4 x 1.8 cm, previously 0.8 x 0.5 cm There is a new, nonspecific ground-glass opacity of the anterior left upper lobe, which is more ill-defined, measuring 2.0 x 1.1 cm   PET scan January 2022 right greater than left pulmonary hypermetabolic areas of airspace and groundglass consolidation, low-level hypermetabolism within the mediastinal nodes.  Hypermetabolism in the small abdominal nodes favored to be reactive   CT chest June 04, 2020 similar appearance of persistent multifocal bilateral areas of patchy groundglass opacities.   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 patient sees Dr. Grayland Ormond for cancer care  01/05/2021 Follow up : COPD, lung mass Returns for follow up . He has COPD and known abnormal CT chest with large GGO in upper lobes.  Patient underwent navigational bronchoscopy June 08, 2020 cytology was nondiagnostic with benign bronchial epithelial cells. We discussed a follow up CT chest . No weight loss or hemoptysis .   Says breathing is doing about the same. Gets winded with activity . Using wheelchair today in the office. . Remains on Spiriva . Says 2 months ago , was treated for Bronchitis , required 2 antibiotics and steroids to improve. Continues to smoke, not interested in quitting.  Feels he is better with less cough and congestion .  Not very active, sedentary . Walks with walker. Needs help with dressing/bathing . Feeds self. Can walk with walker short distance.   Has Oxygen at home but does not used in >1 year.   No Known Allergies  Immunization History  Administered Date(s) Administered   Fluad Quad(high Dose 65+) 01/05/2021   Influenza Split 01/20/2014, 05/11/2015   Influenza, Seasonal, Injecte, Preservative Fre 02/08/2010, 04/05/2011, 02/03/2012, 01/31/2013   Influenza,inj,Quad PF,6+ Mos 01/20/2014, 04/29/2015, 01/28/2016, 05/15/2019, 02/27/2020   Influenza-Unspecified 01/30/2014, 02/27/2020   Pneumococcal Conjugate-13 04/29/2015   Pneumococcal Polysaccharide-23 02/08/2010, 05/11/2015   Tdap 08/28/2017   Zoster, Live 08/03/2012    Past Medical History:  Diagnosis Date   Anxiety    mild   Cancer (  [place]) 2015   tonsil   COPD (chronic obstructive pulmonary disease) ([place])    Diabetes mellitus without complication ([place])    controlled with diet   Elevated cholesterol    GERD (gastroesophageal reflux disease)    History of kidney stones    Hypertension    Hypothyroidism    Morbid  obesity with BMI of 40.0-44.9, adult ([place])    Pneumonia    klebsiella pneumoniae; extended stay in hospital requiring tracheotomy and gastrotomy   Seizures ([place]) 2015   related to sepsis   Septic shock ([place])    Sleep apnea 2019   has a Cpap machime    Stroke ([place]) 2007   no peripheral vision on left side of both eyes & left arm gets numb under stress    Tobacco History: Social History   Tobacco Use  Smoking Status Every Day   Packs/day: 1.00   Years: 40.00   Pack years: 40.00   Types: Cigarettes  Smokeless Tobacco Never   Ready to quit: No Counseling given: Yes   Outpatient Medications Prior to Visit  Medication Sig Dispense Refill   acetaminophen (TYLENOL) 500 MG tablet Take 500 mg by mouth every 4 (four) hours as needed for moderate pain.     amLODipine (NORVASC) 5 MG tablet Take 5 mg by mouth daily.     Ascorbic Acid (VITAMIN C PO) Take 1 tablet by mouth daily in the afternoon.     aspirin EC 81 MG EC tablet Take 1 tablet (81 mg total) by mouth daily. 30 tablet 1   atorvastatin (LIPITOR) 80 MG tablet Take 80 mg by mouth daily at 6 PM.      Cholecalciferol 2000 UNITS CAPS Take 2,000 Units by mouth daily.      FLUoxetine (PROZAC) 10 MG capsule Take 10 mg by mouth daily.     gabapentin (NEURONTIN) 600 MG tablet Take 600 mg by mouth at bedtime.      levETIRAcetam (KEPPRA) 500 MG tablet Take 500 mg by mouth 2 (two) times daily.     levothyroxine (SYNTHROID) 112 MCG tablet Take 112 mcg by mouth daily.     magnesium oxide (MAG-OX) 400 MG tablet Take 400 mg by mouth 2 (two) times daily.     Omega-3 Fatty Acids (FISH OIL) 1000 MG CAPS Take 1,000 mg by mouth daily.      omeprazole (PRILOSEC) 20 MG capsule Take 20 mg by mouth daily.     predniSONE (STERAPRED UNI-PAK 21 TAB) 10 MG (21) TBPK tablet Use as directed 21 tablet 0   Tiotropium Bromide Monohydrate (SPIRIVA RESPIMAT) 2.5 MCG/ACT AERS Inhale 2 puffs into the lungs daily. 4 g 11   VENTOLIN HFA 108 (90 Base) MCG/ACT inhaler  Inhale 1-2 puffs into the lungs every 6 (six) hours as needed (for shortness of breath/wheezing.). 1 g 6   No facility-administered medications prior to visit.     Review of Systems:   Constitutional:   No  weight loss, night sweats,  Fevers, chills,  +fatigue, or  lassitude.  HEENT:   No headaches,  Difficulty swallowing,  Tooth/dental problems, or  Sore throat,                No sneezing, itching, ear ache, nasal congestion, post nasal drip,   CV:  No chest pain,  Orthopnea, PND, swelling in lower extremities, anasarca, dizziness, palpitations, syncope.   GI  No heartburn, indigestion, abdominal pain, nausea, vomiting, diarrhea, change in bowel habits, loss of appetite, bloody stools.  Resp:  No chest wall deformity  Skin: no rash or lesions.  GU: no dysuria, change in color of urine, no urgency or frequency.  No flank pain, no hematuria   MS:  No joint pain or swelling.  No decreased range of motion.  No back pain.    Physical Exam  BP 130/64 (BP Location: Left Arm, Patient Position: Sitting, Cuff Size: Normal)   Pulse 80   Temp 98.3 F (36.8 C) (Oral)   Ht 5\' 10"  (1.778 m)   Wt (!) 309 lb (140.2 kg)   SpO2 96%   BMI 44.34 kg/m   GEN: A/Ox3; pleasant , NAD, elderly in wc    HEENT:  [place]/AT,  EACs-clear, TMs-wnl, NOSE-clear, THROAT-clear, no lesions, no postnasal drip or exudate noted.   NECK:  Supple w/ fair ROM; no JVD; normal carotid impulses w/o bruits; no thyromegaly or nodules palpated; no lymphadenopathy.    RESP  Clear  P & A; w/o, wheezes/ rales/ or rhonchi. no accessory muscle use, no dullness to percussion  CARD:  RRR, no m/r/g, tr  peripheral edema, pulses intact, no cyanosis or clubbing.  GI:   Soft & nt; nml bowel sounds; no organomegaly or masses detected.   Musco: Warm bil, no deformities or joint swelling noted.   Neuro: alert, no focal deficits noted.    Skin: Warm, no lesions or rashes    Lab Results:   BMET   BNP No results found  for: BNP  ProBNP No results found for: PROBNP  Imaging: No results found.    PFT Results Latest Ref Rng & Units 12/31/2015  FVC-Pre L 4.26  FVC-Predicted Pre % 92  FVC-Post L 4.21  FVC-Predicted Post % 91  Pre FEV1/FVC % % 72  Post FEV1/FCV % % 70  FEV1-Pre L 3.08  FEV1-Predicted Pre % 90  FEV1-Post L 2.93  DLCO uncorrected ml/min/mmHg 18.52  DLCO UNC% % 57  DLVA Predicted % 60    No results found for: NITRICOXIDE      Assessment & Plan:   COPD (chronic obstructive pulmonary disease) Compensated on present regimen  Recent flare - resolved with abx and steroids  Smoking cessation discussed   Plan  Patient Instructions  Continue on Spiriva daily  Activity as tolerated.  Flu shot today .  Set up for CT chest  Work on not smoking.  Follow up with Dr. Mortimer Fries in 4 months and As needed        Mass of upper lobe of right lung GGO in RUL , s/p Navigational Bronch 06/2020  -cytology /path non diagnostic  Needs follow up CT chest      Calia Napp, NP 01/05/2021

## 2021-01-22 ENCOUNTER — Ambulatory Visit: Payer: Medicare HMO | Attending: Adult Health

## 2021-02-09 DIAGNOSIS — H524 Presbyopia: Secondary | ICD-10-CM | POA: Diagnosis not present

## 2021-02-24 DIAGNOSIS — E039 Hypothyroidism, unspecified: Secondary | ICD-10-CM | POA: Diagnosis not present

## 2021-02-24 DIAGNOSIS — J449 Chronic obstructive pulmonary disease, unspecified: Secondary | ICD-10-CM | POA: Diagnosis not present

## 2021-02-24 DIAGNOSIS — E669 Obesity, unspecified: Secondary | ICD-10-CM | POA: Diagnosis not present

## 2021-02-24 DIAGNOSIS — Z6841 Body Mass Index (BMI) 40.0 and over, adult: Secondary | ICD-10-CM | POA: Diagnosis not present

## 2021-02-24 DIAGNOSIS — I10 Essential (primary) hypertension: Secondary | ICD-10-CM | POA: Diagnosis not present

## 2021-02-24 DIAGNOSIS — R058 Other specified cough: Secondary | ICD-10-CM | POA: Diagnosis not present

## 2021-02-24 DIAGNOSIS — E1142 Type 2 diabetes mellitus with diabetic polyneuropathy: Secondary | ICD-10-CM | POA: Diagnosis not present

## 2021-02-26 DIAGNOSIS — R058 Other specified cough: Secondary | ICD-10-CM | POA: Diagnosis not present

## 2021-06-04 ENCOUNTER — Other Ambulatory Visit: Payer: Self-pay | Admitting: Internal Medicine

## 2021-06-17 ENCOUNTER — Ambulatory Visit
Admission: RE | Admit: 2021-06-17 | Discharge: 2021-06-17 | Disposition: A | Discharge status: Home / Self Care | Payer: PPO | Source: Ambulatory Visit | Attending: Adult Health | Admitting: Adult Health

## 2021-06-17 ENCOUNTER — Other Ambulatory Visit: Payer: Self-pay

## 2021-06-17 DIAGNOSIS — R918 Other nonspecific abnormal finding of lung field: Secondary | ICD-10-CM | POA: Diagnosis not present

## 2021-06-17 DIAGNOSIS — J439 Emphysema, unspecified: Secondary | ICD-10-CM | POA: Diagnosis not present

## 2021-06-17 DIAGNOSIS — I700 Atherosclerosis of aorta: Secondary | ICD-10-CM | POA: Diagnosis not present

## 2021-06-17 DIAGNOSIS — R911 Solitary pulmonary nodule: Secondary | ICD-10-CM | POA: Diagnosis not present

## 2021-06-21 ENCOUNTER — Encounter: Payer: Self-pay | Admitting: Pulmonary Disease

## 2021-06-21 ENCOUNTER — Ambulatory Visit: Payer: PPO | Admitting: Pulmonary Disease

## 2021-06-21 ENCOUNTER — Other Ambulatory Visit: Payer: Self-pay

## 2021-06-21 ENCOUNTER — Telehealth: Payer: Self-pay | Admitting: Internal Medicine

## 2021-06-21 VITALS — BP 132/80 | HR 72 | Temp 98.2°F | Ht 70.0 in | Wt 287.0 lb

## 2021-06-21 DIAGNOSIS — R918 Other nonspecific abnormal finding of lung field: Secondary | ICD-10-CM | POA: Diagnosis not present

## 2021-06-21 DIAGNOSIS — J441 Chronic obstructive pulmonary disease with (acute) exacerbation: Secondary | ICD-10-CM

## 2021-06-21 DIAGNOSIS — G4733 Obstructive sleep apnea (adult) (pediatric): Secondary | ICD-10-CM | POA: Diagnosis not present

## 2021-06-21 MED ORDER — BREZTRI AEROSPHERE 160-9-4.8 MCG/ACT IN AERO: 2.0000 | INHALATION_SPRAY | Freq: Two times a day (BID) | RESPIRATORY_TRACT | 0 refills | Status: DC

## 2021-06-21 MED ORDER — METHYLPREDNISOLONE 4 MG PO TBPK: ORAL_TABLET | ORAL | 0 refills | Status: DC

## 2021-06-21 MED ORDER — AMOXICILLIN-POT CLAVULANATE 875-125 MG PO TABS: 1.0000 | ORAL_TABLET | Freq: Two times a day (BID) | ORAL | 0 refills | Status: AC

## 2021-06-21 NOTE — Telephone Encounter (Signed)
Dr. Patsey Berthold, please advise.  Tammy and Dr. Mortimer Fries are both unavailable.

## 2021-06-21 NOTE — Patient Instructions (Addendum)
We are going to treat you with an antibiotic and some prednisone like medication.  The medications were sent to [facility].  We are going to do a CT of the chest for planning purposes in a little bit over 2 weeks time.  We will see you in follow-up after that is done so we can discuss next steps.   Follow-up here will be in 3 to 4 weeks time.  We will however stay in contact with you via the phone to let you know the results of the CT.  We are giving you a trial of an inhaler called Breztri 2 puffs twice a day, stop taking the Spiriva while on the [place].  You can still use your as needed albuterol with the [facility].  Make sure you rinse your mouth well after you use the Breztri.   Please consider stopping smoking.

## 2021-06-21 NOTE — Progress Notes (Signed)
Subjective:    Patient ID: Shawn Lindsey, male    DOB: [birth year], 71 y.o.   MRN: [id number]  Patient Care Team: Leonel Ramsay, MD as PCP - General (Infectious Diseases) Flora Lipps, MD as Consulting Physician (Pulmonary Disease)  Chief Complaint  Patient presents with   Follow-up    HPI Patient is a 71 year old current smoker (1 PPD) who has been followed by Dr. Patricia Pesa for right upper lobe irregular consolidation of prior cavitation and multiple lung nodules.  Prior history of tonsillar cancer.  Prior history of prolonged ICU care approximately 6 years ago.  The patient had a recent CT chest on 17 June 2021 that showed increased size on previously noted bilateral patchy nodular appearance airspace disease.  Also multifocal areas seen through the right middle lobe and right lower lobe that had increased in size.  In a November 2021 CT chest there was a right upper lobe nodular opacity which was new from prior CT scan and a left upper lobe nodular opacity with pleural-based density which is also new from prior scan.  At CT of January 2022 showed hypermetabolism particularly on the right.  The patient underwent bronchoscopy on 08 June 2020 HI performed as Dr. Mortimer Fries was not available.  The patient was noted to have a lot of inflammation and bleeding during the procedure and the biopsies were nondiagnostic.  He was treated empirically with antibiotics and steroids and actually improved.  He saw our nurse practitioner Rexene Edison on 05 January 2021 when the follow-up CT noted above was ordered.  Patient presents today for discussion of this these findings and evaluation for possible robotic assisted navigational bronchoscopy.  As noted the patient continues to smoke 1 pack of cigarettes per day.  He has been noticing some increased shortness of breath of late and wheezing.  He is only on Spiriva.  He has not had any fevers, chills or sweats but feels some malaise.  Mostly  sedentary.  Normally walks with the assistance of a walker.  Has OSA but is noncompliant with CPAP.  Had navigational (ENB) bronchoscopy a year ago without untoward side effect from anesthesia.    Review of Systems A 10 point review of systems was performed and it is as noted above otherwise negative.  Patient Active Problem List   Diagnosis Date Noted   Type 2 diabetes mellitus with diabetic polyneuropathy, with long-term current use of insulin ([place])    Nephrolithiasis    Ureterolithiasis    Proteus mirabilis infection    Pneumonia of right upper lobe due to Klebsiella pneumoniae ([place])    Acute renal failure with tubular necrosis ([place])    Acute respiratory failure with hypoxia (HCC)    Septic shock ([place]) 07/05/2019   Mass of upper lobe of right lung 09/29/2015   Cancer of tonsil ([place]) 08/05/2014   HTN (hypertension) 08/05/2014   COPD (chronic obstructive pulmonary disease) ([place]) 08/05/2014   Hyperlipidemia 08/05/2014   Social History   Tobacco Use   Smoking status: Every Day    Packs/day: 1.00    Years: 40.00    Pack years: 40.00    Types: Cigarettes   Smokeless tobacco: Never   Tobacco comments:    1ppd - 06/21/2021  Substance Use Topics   Alcohol use: No   No Known Allergies  Current Meds  Medication Sig   acetaminophen (TYLENOL) 500 MG tablet Take 500 mg by mouth every 4 (four) hours as needed for moderate pain.   amLODipine (  NORVASC) 5 MG tablet Take 5 mg by mouth daily.   amoxicillin-clavulanate (AUGMENTIN) 875-125 MG tablet Take 1 tablet by mouth 2 (two) times daily for 14 days.   Ascorbic Acid (VITAMIN C PO) Take 1 tablet by mouth daily in the afternoon.   aspirin EC 81 MG EC tablet Take 1 tablet (81 mg total) by mouth daily.   atorvastatin (LIPITOR) 80 MG tablet Take 80 mg by mouth daily at 6 PM.    Budeson-Glycopyrrol-Formoterol (BREZTRI AEROSPHERE) 160-9-4.8 MCG/ACT AERO Inhale 2 puffs into the lungs in the morning and at bedtime.   Cholecalciferol 2000 UNITS  CAPS Take 2,000 Units by mouth daily.    FLUoxetine (PROZAC) 10 MG capsule Take 10 mg by mouth daily.   gabapentin (NEURONTIN) 600 MG tablet Take 600 mg by mouth at bedtime.    levETIRAcetam (KEPPRA) 500 MG tablet Take 500 mg by mouth 2 (two) times daily.   levothyroxine (SYNTHROID) 112 MCG tablet Take 112 mcg by mouth daily.   magnesium oxide (MAG-OX) 400 MG tablet Take 400 mg by mouth 2 (two) times daily.   methylPREDNISolone (MEDROL DOSEPAK) 4 MG TBPK tablet Take as directed in the package, this is a taper package.   Omega-3 Fatty Acids (FISH OIL) 1000 MG CAPS Take 1,000 mg by mouth daily.    omeprazole (PRILOSEC) 20 MG capsule Take 20 mg by mouth daily.   SPIRIVA RESPIMAT 2.5 MCG/ACT AERS INHALE 2 PUFFS INTO THE LUNGS ONCE DAILY   VENTOLIN HFA 108 (90 Base) MCG/ACT inhaler Inhale 1-2 puffs into the lungs every 6 (six) hours as needed (for shortness of breath/wheezing.).   Immunization History  Administered Date(s) Administered   Fluad Quad(high Dose 65+) 01/05/2021   Influenza Split 01/20/2014, 05/11/2015   Influenza, Seasonal, Injecte, Preservative Fre 02/08/2010, 04/05/2011, 02/03/2012, 01/31/2013   Influenza,inj,Quad PF,6+ Mos 01/20/2014, 04/29/2015, 01/28/2016, 05/15/2019, 02/27/2020   Influenza-Unspecified 01/30/2014, 04/29/2015, 01/28/2016, 05/15/2019, 02/27/2020   Pneumococcal Conjugate-13 04/29/2015   Pneumococcal Polysaccharide-23 02/08/2010, 05/11/2015   Tdap 08/28/2017   Zoster, Live 08/03/2012       Objective:   Physical Exam BP 132/80 (BP Location: Left Arm, Patient Position: Sitting, Cuff Size: Normal)    Pulse 72    Temp 98.2 F (36.8 C) (Oral)    Ht 5\' 10"  (1.778 m)    Wt 287 lb (130.2 kg)    SpO2 99%    BMI 41.18 kg/m  GENERAL: Obese gentleman, no acute distress.  Presents in transport chair.  No respiratory distress.  Number stational dyspnea HEAD: Normocephalic, atraumatic.  EYES: Pupils equal, round, reactive to light.  No scleral icterus.  MOUTH:  Nose/mouth/throat not examined due to masking requirements for COVID 19. NECK: Supple. No thyromegaly. Trachea midline. No JVD.  No adenopathy. PULMONARY: Good air entry bilaterally.  There are scattered wheezes throughout.  Rhonchi in the upper lung zones.   CARDIOVASCULAR: S1 and S2. Regular rate and rhythm.  No rubs, murmurs or gallops heard. ABDOMEN: Obese otherwise benign. MUSCULOSKELETAL: No joint deformity, no clubbing, no edema.  NEUROLOGIC: No focal deficits. SKIN: Intact,warm,dry. PSYCH: Mood and behavior normal.   RADIOGRAPHIC DATA Representative images from CT chest performed 17 June 2021, see areas of interest marked with arrows:           Assessment & Plan:     ICD-10-CM   1. 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 COPD with acute exacerbation (HCC)  J44.1    Treat with Medrol Dosepak Augmentin 875 milligrams twice daily x14 days DC Spiriva Breztri 2 puffs twice a day    3. OSA (obstructive sleep apnea)  G47.33    This issue adds complexity to his management Noncompliance with CPAP     Orders Placed This Encounter  Procedures   CT SUPER D CHEST WO [facility] PILOT    [facility] Protocol  Slice thickness 4.19 Slice interval 0.5 Arms Down Inspiratory image.    Standing Status:   Future    Standing Expiration Date:   06/21/2022    Order Specific Question:   Preferred imaging location?    Answer:   [facility] ordered this encounter  Medications   amoxicillin-clavulanate (AUGMENTIN) 875-125 MG tablet    Sig: Take 1 tablet by mouth 2 (two) times daily for 14 days.    Dispense:  28 tablet    Refill:  0   methylPREDNISolone (MEDROL DOSEPAK) 4 MG TBPK tablet    Sig: Take as directed in the package, this is a taper package.    Dispense:  21 tablet     Refill:  0   Budeson-Glycopyrrol-Formoterol (BREZTRI AEROSPHERE) 160-9-4.8 MCG/ACT AERO    Sig: Inhale 2 puffs into the lungs in the morning and at bedtime.    Dispense:  4 g    Refill:  0    Order Specific Question:   Lot Number?    Answer:   [id number]    Order Specific Question:   Expiration Date?    Answer:   12/30/2023    Order Specific Question:   Manufacturer?    Answer:   [employer] [[id number]]    Order Specific Question:   NDC    Answer:   9735-3299-24 [[id number]]    Order Specific Question:   Quantity    Answer:   2   The patient in follow-up in 3 to 4 weeks time after planning CT performed.  Suspect patient will need robotic bronchoscopy for diagnosis.  If the lesions are persistent after treatment with antibiotics and steroids he will need biopsy.  Patient is agreeable with this plan.   Renold Don, MD Advanced Bronchoscopy PCCM [facility]    *This note was dictated using voice recognition software/Dragon.  Despite best efforts to proofread, errors can occur which can change the meaning. Any transcriptional errors that result from this process are unintentional and may not be fully corrected at the time of dictation.

## 2021-06-21 NOTE — Telephone Encounter (Signed)
Spoke to patient. He is requesting CT reports from 06/17/2021. He is concerned that he may be developing PNA. C/o right side chest discomfort that radiates under arm pit and prod cough with yellow sputum x1.5w. Sob is baseline. Denied f/c/s or additional sx.  He is using OTC tussin QHS, albuterol HFA once daily and Spiriva once daily.   Tammy, please advise. thanks

## 2021-06-21 NOTE — Telephone Encounter (Signed)
Appt scheduled 06/21/21 at 2:00. Patient is aware and voiced his understanding.  Nothing further needed.

## 2021-06-21 NOTE — Telephone Encounter (Signed)
We will see the patient today.

## 2021-06-22 NOTE — Progress Notes (Signed)
Discussed at length yesterday with patient.

## 2021-06-28 DIAGNOSIS — F17200 Nicotine dependence, unspecified, uncomplicated: Secondary | ICD-10-CM | POA: Diagnosis not present

## 2021-06-28 DIAGNOSIS — Z6841 Body Mass Index (BMI) 40.0 and over, adult: Secondary | ICD-10-CM | POA: Diagnosis not present

## 2021-06-28 DIAGNOSIS — E119 Type 2 diabetes mellitus without complications: Secondary | ICD-10-CM | POA: Diagnosis not present

## 2021-06-28 DIAGNOSIS — E039 Hypothyroidism, unspecified: Secondary | ICD-10-CM | POA: Diagnosis not present

## 2021-06-28 DIAGNOSIS — E785 Hyperlipidemia, unspecified: Secondary | ICD-10-CM | POA: Diagnosis not present

## 2021-06-28 DIAGNOSIS — J449 Chronic obstructive pulmonary disease, unspecified: Secondary | ICD-10-CM | POA: Diagnosis not present

## 2021-06-28 DIAGNOSIS — R918 Other nonspecific abnormal finding of lung field: Secondary | ICD-10-CM | POA: Diagnosis not present

## 2021-06-28 DIAGNOSIS — I10 Essential (primary) hypertension: Secondary | ICD-10-CM | POA: Diagnosis not present

## 2021-07-01 DIAGNOSIS — J449 Chronic obstructive pulmonary disease, unspecified: Secondary | ICD-10-CM | POA: Diagnosis not present

## 2021-07-01 DIAGNOSIS — R918 Other nonspecific abnormal finding of lung field: Secondary | ICD-10-CM | POA: Diagnosis not present

## 2021-07-07 DIAGNOSIS — J449 Chronic obstructive pulmonary disease, unspecified: Secondary | ICD-10-CM | POA: Diagnosis not present

## 2021-07-13 ENCOUNTER — Telehealth: Payer: Self-pay | Admitting: Pulmonary Disease

## 2021-07-13 MED ORDER — BREZTRI AEROSPHERE 160-9-4.8 MCG/ACT IN AERO: 2.0000 | INHALATION_SPRAY | Freq: Two times a day (BID) | RESPIRATORY_TRACT | 5 refills | Status: DC

## 2021-07-13 NOTE — Telephone Encounter (Signed)
Called and spoke to patients girlfriend [name]. Called to state that Curtice worked well for patient. Nothing further needed.  ?

## 2021-07-14 ENCOUNTER — Ambulatory Visit
Admission: RE | Admit: 2021-07-14 | Discharge: 2021-07-14 | Disposition: A | Discharge status: Home / Self Care | Payer: PPO | Source: Ambulatory Visit | Attending: Pulmonary Disease | Admitting: Pulmonary Disease

## 2021-07-14 ENCOUNTER — Other Ambulatory Visit: Payer: PPO

## 2021-07-14 DIAGNOSIS — R918 Other nonspecific abnormal finding of lung field: Secondary | ICD-10-CM | POA: Insufficient documentation

## 2021-07-14 DIAGNOSIS — J432 Centrilobular emphysema: Secondary | ICD-10-CM | POA: Diagnosis not present

## 2021-07-23 ENCOUNTER — Ambulatory Visit (INDEPENDENT_AMBULATORY_CARE_PROVIDER_SITE_OTHER): Payer: PPO | Admitting: Pulmonary Disease

## 2021-07-23 ENCOUNTER — Encounter: Payer: Self-pay | Admitting: Pulmonary Disease

## 2021-07-23 ENCOUNTER — Telehealth: Payer: Self-pay

## 2021-07-23 ENCOUNTER — Other Ambulatory Visit: Payer: Self-pay

## 2021-07-23 ENCOUNTER — Other Ambulatory Visit (HOSPITAL_COMMUNITY): Payer: Self-pay

## 2021-07-23 VITALS — BP 112/80 | HR 86 | Temp 97.3°F | Ht 70.0 in | Wt 287.0 lb

## 2021-07-23 DIAGNOSIS — G4733 Obstructive sleep apnea (adult) (pediatric): Secondary | ICD-10-CM

## 2021-07-23 DIAGNOSIS — R918 Other nonspecific abnormal finding of lung field: Secondary | ICD-10-CM

## 2021-07-23 DIAGNOSIS — J449 Chronic obstructive pulmonary disease, unspecified: Secondary | ICD-10-CM | POA: Diagnosis not present

## 2021-07-23 DIAGNOSIS — R0781 Pleurodynia: Secondary | ICD-10-CM

## 2021-07-23 DIAGNOSIS — F17210 Nicotine dependence, cigarettes, uncomplicated: Secondary | ICD-10-CM | POA: Diagnosis not present

## 2021-07-23 DIAGNOSIS — Z6841 Body Mass Index (BMI) 40.0 and over, adult: Secondary | ICD-10-CM | POA: Diagnosis not present

## 2021-07-23 MED ORDER — LIDOCAINE 5 % EX PTCH: 1.0000 | MEDICATED_PATCH | CUTANEOUS | 1 refills | Status: DC

## 2021-07-23 MED ORDER — BREZTRI AEROSPHERE 160-9-4.8 MCG/ACT IN AERO: 2.0000 | INHALATION_SPRAY | Freq: Two times a day (BID) | RESPIRATORY_TRACT | 0 refills | Status: DC

## 2021-07-23 MED ORDER — LIDOCAINE 5 % EX PTCH: 1.0000 | MEDICATED_PATCH | Freq: Two times a day (BID) | CUTANEOUS | 1 refills | Status: AC

## 2021-07-23 NOTE — Telephone Encounter (Signed)
Spoke to patient and relayed below message. He voiced his understanding.  ?Nothing further needed.  ? ?

## 2021-07-23 NOTE — Progress Notes (Signed)
? ?Subjective:  ? ? Patient ID: Shawn Lindsey, male    DOB: 07-05-1950, 71 y.o.   MRN: [id number] ?Patient Care Team: ?Leonel Ramsay, MD as PCP - General (Infectious Diseases) ?Flora Lipps, MD as Consulting Physician (Pulmonary Disease) ? ?Chief Complaint  ?Patient presents with  ? Follow-up  ? ?HPI ?Patient is a 71 year old current smoker (1 PPD) who presents for follow-up after a 21 June 2021 visit.  Patient's primary pulmonologist is Dr. Patricia Pesa.  Patient has been followed up for right upper lobe irregular consolidation and prior cavitation with multiple lung nodules.  Prior history of tonsillar cancer.  History of prolonged ICU care approximately 6 years ago.  The patient had CT chest on 17 June 2021 that showed increased size on previously noted bilateral patchy nodular appearance of airspace disease.  Also multifocal areas seen throughout the right middle lobe and right lower lobe that had increased in size.  Patient had undergone prior navigational bronchoscopy on 08 June 2020 which was nondiagnostic and showed inflammatory cells and proteinaceous debris.  Patient also had significant bleeding during the procedure and significant inflammatory changes throughout the airway.  The patient was instructed to follow-up within 4 to 6 weeks however he was lost to follow-up until September 2022, at that time a chest CT was ordered which was finally performed on 16 February with results as noted above.  Patient then was referred for potential robotic bronchoscopy since his prior procedure the patient has shown some decline in functional status and had a significant COPD exacerbation.  We requested repeat imaging  after treatment with antibiotics and prednisone.  The patient was also switched to [facility] 2 puffs twice a day.  Since that visit the patient notes that the Judithann Sauger has really helped him with his pulmonary status.  He states that he can breathe much better.  He continues to be limited on  his activities of daily living due to multiple other issues stemming from a prior prolonged ICU stay. ? ?Patient has also been complaining of some right-sided pleuritic pain.  He has been taking 800 mg of ibuprofen 3 times a day.  We discussed alternative analgesic management. ? ?We reviewed the newer CT chest with the patient.  He understands need for repeat biopsy for tissue diagnosis.  Patient agrees to proceed. ? ?TEST/EVENTS :  ?CT chest on 07/02/2015 RUL cavitary lesions measures 5.0 x 2.2 x 3.6 cm.  ?Previously 5.4 x 2.5 x 3.5 cm. ?CT chest 01/05/16 shows RUL cavitary mass 4.8x4.1 CM, previous measurement 4.6 x 3.8CM ?CT chest 08/2016 no significant change in size of RUL mass ?CT chest 03/2020 Significant interval-increase in a dense, masslike irregular consolidation primarily ?involving the right upper lobe, with architectural distortion and volume loss, the dominant component now measuring approximately 7.4 x 7.1 cm, previously 5.4 x 4.1 cm when measured most similarly,there are new and enlarged satellite nodules in the right upper lobe new and enlarged nodules in the right middle lobe, the largest in the lateral segment measuring 2.4 x 1.8 cm, previously 0.8 x 0.5 cm.There is a new, nonspecific ground-glass opacity of the anterior left upper lobe, which is more ill-defined, measuring 2.0 x 1.1 cm  ?PET scan January 2022 right greater than left pulmonary hypermetabolic areas of airspace and groundglass consolidation, low-level hypermetabolism within the mediastinal nodes.  Hypermetabolism in the small abdominal nodes favored to be reactive ?CT chest June 04, 2020 similar appearance of persistent multifocal bilateral areas of patchy groundglass opacities. ?CT chest  06/17/2021: Multiple bilateral patchy nodular appearing areas of airspace disease largest area of involvement measures 10 cm x 5.6 cm and is seen in the posterior lateral aspect of the right upper lobe increased when compared to prior (6.2 cm x 4.0  cm) corresponds to area of abnormality seen on prior PET/CT.  Overall all areas are worsening. ?CT chest 07/15/2021:Nodular and masslike areas of ground-glass and mixed ?consolidation/ground-glass in the lungs bilaterally, new or progressive from 06/04/2020. 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  There is associated obstruction of the posterior segmental right upper lobe bronchus not previously noted. ? ?PFT 12/2015 ratio 72%, FEv1 90% DLCo 57% 6MWT WNL ?Office Arlyce Harman 01/16/17 Ratio 74% Fev1 92% predicted ? ?Review of Systems ?A 10 point review of systems was performed and it is as noted above otherwise negative. ? ?Patient Active Problem List  ? Diagnosis Date Noted  ? Type 2 diabetes mellitus with diabetic polyneuropathy, with long-term current use of insulin ([place])   ? Nephrolithiasis   ? Ureterolithiasis   ? Proteus mirabilis infection   ? Pneumonia of right upper lobe due to Klebsiella pneumoniae [facility])   ? Acute renal failure with tubular necrosis (HCC)   ? Acute respiratory failure with hypoxia ([place])   ? Septic shock ([place]) 07/05/2019  ? Mass of upper lobe of right lung 09/29/2015  ? Cancer of tonsil ([place]) 08/05/2014  ? HTN (hypertension) 08/05/2014  ? COPD (chronic obstructive pulmonary disease) ([place]) 08/05/2014  ? Hyperlipidemia 08/05/2014  ? ?Social History  ? ?Tobacco Use  ? Smoking status: Every Day  ?  Packs/day: 1.00  ?  Years: 40.00  ?  Pack years: 40.00  ?  Types: Cigarettes  ? Smokeless tobacco: Never  ? Tobacco comments:  ?  1ppd - 07/23/21  ?Substance Use Topics  ? Alcohol use: No  ? ?No Known Allergies ? ?Current Meds  ?Medication Sig  ? acetaminophen (TYLENOL) 500 MG tablet Take 500 mg by mouth every 4 (four) hours as needed for moderate pain.  ? amLODipine (NORVASC) 5 MG tablet Take 5 mg by mouth daily.  ? Ascorbic Acid (VITAMIN C PO) Take 1 tablet by mouth daily in the afternoon.  ? aspirin EC 81 MG EC tablet Take 1 tablet (81 mg total) by mouth daily.  ? atorvastatin (LIPITOR)  80 MG tablet Take 80 mg by mouth daily at 6 PM.   ? Budeson-Glycopyrrol-Formoterol (BREZTRI AEROSPHERE) 160-9-4.8 MCG/ACT AERO Inhale 2 puffs into the lungs in the morning and at bedtime.  ? Budeson-Glycopyrrol-Formoterol (BREZTRI AEROSPHERE) 160-9-4.8 MCG/ACT AERO Inhale 2 puffs into the lungs in the morning and at bedtime.  ? Budeson-Glycopyrrol-Formoterol (BREZTRI AEROSPHERE) 160-9-4.8 MCG/ACT AERO Inhale 2 puffs into the lungs in the morning and at bedtime.  ? Cholecalciferol 2000 UNITS CAPS Take 2,000 Units by mouth daily.   ? FLUoxetine (PROZAC) 10 MG capsule Take 10 mg by mouth daily.  ? gabapentin (NEURONTIN) 600 MG tablet Take 600 mg by mouth at bedtime.   ? levETIRAcetam (KEPPRA) 500 MG tablet Take 500 mg by mouth 2 (two) times daily.  ? levothyroxine (SYNTHROID) 112 MCG tablet Take 112 mcg by mouth daily.  ? lidocaine (LIDODERM) 5 % Place 1 patch onto the skin every 12 (twelve) hours for 15 days.  ? magnesium oxide (MAG-OX) 400 MG tablet Take 400 mg by mouth 2 (two) times daily.  ? Omega-3 Fatty Acids (FISH OIL) 1000 MG CAPS Take 1,000 mg by mouth daily.   ? omeprazole (PRILOSEC) 20 MG capsule  Take 20 mg by mouth daily.  ? VENTOLIN HFA 108 (90 Base) MCG/ACT inhaler Inhale 1-2 puffs into the lungs every 6 (six) hours as needed (for shortness of breath/wheezing.).  ? [DISCONTINUED] lidocaine (LIDODERM) 5 % Place 1 patch onto the skin daily. Remove & Discard patch within 12 hours or as directed by MD  ? [DISCONTINUED] methylPREDNISolone (MEDROL DOSEPAK) 4 MG TBPK tablet Take as directed in the package, this is a taper package.  ? [DISCONTINUED] SPIRIVA RESPIMAT 2.5 MCG/ACT AERS INHALE 2 PUFFS INTO THE LUNGS ONCE DAILY  ? ?Immunization History  ?Administered Date(s) Administered  ? Fluad Quad(high Dose 65+) 01/05/2021  ? Influenza Split 01/20/2014, 05/11/2015  ? Influenza, Seasonal, Injecte, Preservative Fre 02/08/2010, 04/05/2011, 02/03/2012, 01/31/2013  ? Influenza,inj,Quad PF,6+ Mos 01/20/2014, 04/29/2015,  01/28/2016, 05/15/2019, 02/27/2020  ? Influenza-Unspecified 01/30/2014, 04/29/2015, 01/28/2016, 05/15/2019, 02/27/2020  ? Pneumococcal Conjugate-13 04/29/2015  ? Pneumococcal Polysaccharide-23 10/10/

## 2021-07-23 NOTE — Telephone Encounter (Signed)
Patient Advocate Encounter ?  ?Received notification from [facility] that prior authorization for Lidoderm 5% patches is required by his/her insurance [employer]. ?  ?PA submitted on 07/23/21 ? ?Key#: [id number] ? ?Status is pending ?   ?[facility] will continue to follow: ? ?Patient Advocate ?Fax: [phone]  ?

## 2021-07-23 NOTE — Telephone Encounter (Signed)
Called and verified that patient only uses O2 at night time. Nothing further needed.  ?

## 2021-07-23 NOTE — Telephone Encounter (Signed)
Pt scheduled for regular bronch with ebus on 08/02/2021 at 12:30 pm . DX is multiple lung nodules of right upper lobe. CPT codes [phone]. Shawn Lindsey please see bronch info.  ?

## 2021-07-23 NOTE — Telephone Encounter (Signed)
Called and spoke to [place] ( on dpr) patient in regards to pre admit telephone call at 07/28/21 between 8-11 am  and [facility] between 8-11 am. Had a clear understanding. Nothing further needed.  ?

## 2021-07-23 NOTE — Patient Instructions (Signed)
Your biopsy will be done on 3 April at 12:30 PM ? ?Have refills of Breztri and your pharmacy. ? ?We sent lidocaine patch prescription to your pharmacy which you can put on the area where it hurts on your chest for 12 hours and then take it off. ? ?Please avoid taking ibuprofen particularly 3 days prior to the procedure.   ?We will see you in follow-up in 3 to 4 weeks time but we will stay in contact with you for any other studies that need to be ordered particularly after the procedure. ? ? ? ?

## 2021-07-23 NOTE — H&P (View-Only) (Signed)
? ?Subjective:  ? ? Patient ID: Shawn Lindsey, male    DOB: 1950-11-22, 71 y.o.   MRN: [id number] ?Patient Care Team: ?Leonel Ramsay, MD as PCP - General (Infectious Diseases) ?Flora Lipps, MD as Consulting Physician (Pulmonary Disease) ? ?Chief Complaint  ?Patient presents with  ? Follow-up  ? ?HPI ?Patient is a 71 year old current smoker (1 PPD) who presents for follow-up after a 21 June 2021 visit.  Patient's primary pulmonologist is Dr. Patricia Pesa.  Patient has been followed up for right upper lobe irregular consolidation and prior cavitation with multiple lung nodules.  Prior history of tonsillar cancer.  History of prolonged ICU care approximately 6 years ago.  The patient had CT chest on 17 June 2021 that showed increased size on previously noted bilateral patchy nodular appearance of airspace disease.  Also multifocal areas seen throughout the right middle lobe and right lower lobe that had increased in size.  Patient had undergone prior navigational bronchoscopy on 08 June 2020 which was nondiagnostic and showed inflammatory cells and proteinaceous debris.  Patient also had significant bleeding during the procedure and significant inflammatory changes throughout the airway.  The patient was instructed to follow-up within 4 to 6 weeks however he was lost to follow-up until September 2022, at that time a chest CT was ordered which was finally performed on 16 February with results as noted above.  Patient then was referred for potential robotic bronchoscopy since his prior procedure the patient has shown some decline in functional status and had a significant COPD exacerbation.  We requested repeat imaging  after treatment with antibiotics and prednisone.  The patient was also switched to [facility] 2 puffs twice a day.  Since that visit the patient notes that the Judithann Sauger has really helped him with his pulmonary status.  He states that he can breathe much better.  He continues to be limited on  his activities of daily living due to multiple other issues stemming from a prior prolonged ICU stay. ? ?Patient has also been complaining of some right-sided pleuritic pain.  He has been taking 800 mg of ibuprofen 3 times a day.  We discussed alternative analgesic management. ? ?We reviewed the newer CT chest with the patient.  He understands need for repeat biopsy for tissue diagnosis.  Patient agrees to proceed. ? ?TEST/EVENTS :  ?CT chest on 07/02/2015 RUL cavitary lesions measures 5.0 x 2.2 x 3.6 cm.  ?Previously 5.4 x 2.5 x 3.5 cm. ?CT chest 01/05/16 shows RUL cavitary mass 4.8x4.1 CM, previous measurement 4.6 x 3.8CM ?CT chest 08/2016 no significant change in size of RUL mass ?CT chest 03/2020 Significant interval-increase in a dense, masslike irregular consolidation primarily ?involving the right upper lobe, with architectural distortion and volume loss, the dominant component now measuring approximately 7.4 x 7.1 cm, previously 5.4 x 4.1 cm when measured most similarly,there are new and enlarged satellite nodules in the right upper lobe new and enlarged nodules in the right middle lobe, the largest in the lateral segment measuring 2.4 x 1.8 cm, previously 0.8 x 0.5 cm.There is a new, nonspecific ground-glass opacity of the anterior left upper lobe, which is more ill-defined, measuring 2.0 x 1.1 cm  ?PET scan January 2022 right greater than left pulmonary hypermetabolic areas of airspace and groundglass consolidation, low-level hypermetabolism within the mediastinal nodes.  Hypermetabolism in the small abdominal nodes favored to be reactive ?CT chest June 04, 2020 similar appearance of persistent multifocal bilateral areas of patchy groundglass opacities. ?CT chest  06/17/2021: Multiple bilateral patchy nodular appearing areas of airspace disease largest area of involvement measures 10 cm x 5.6 cm and is seen in the posterior lateral aspect of the right upper lobe increased when compared to prior (6.2 cm x 4.0  cm) corresponds to area of abnormality seen on prior PET/CT.  Overall all areas are worsening. ?CT chest 07/15/2021:Nodular and masslike areas of ground-glass and mixed ?consolidation/ground-glass in the lungs bilaterally, new or progressive from 06/04/2020. 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  There is associated obstruction of the posterior segmental right upper lobe bronchus not previously noted. ? ?PFT 12/2015 ratio 72%, FEv1 90% DLCo 57% 6MWT WNL ?Office Arlyce Harman 01/16/17 Ratio 74% Fev1 92% predicted ? ?Review of Systems ?A 10 point review of systems was performed and it is as noted above otherwise negative. ? ?Patient Active Problem List  ? Diagnosis Date Noted  ? Type 2 diabetes mellitus with diabetic polyneuropathy, with long-term current use of insulin ([place])   ? Nephrolithiasis   ? Ureterolithiasis   ? Proteus mirabilis infection   ? Pneumonia of right upper lobe due to Klebsiella pneumoniae [facility])   ? Acute renal failure with tubular necrosis (HCC)   ? Acute respiratory failure with hypoxia ([place])   ? Septic shock ([place]) 07/05/2019  ? Mass of upper lobe of right lung 09/29/2015  ? Cancer of tonsil ([place]) 08/05/2014  ? HTN (hypertension) 08/05/2014  ? COPD (chronic obstructive pulmonary disease) ([place]) 08/05/2014  ? Hyperlipidemia 08/05/2014  ? ?Social History  ? ?Tobacco Use  ? Smoking status: Every Day  ?  Packs/day: 1.00  ?  Years: 40.00  ?  Pack years: 40.00  ?  Types: Cigarettes  ? Smokeless tobacco: Never  ? Tobacco comments:  ?  1ppd - 07/23/21  ?Substance Use Topics  ? Alcohol use: No  ? ?No Known Allergies ? ?Current Meds  ?Medication Sig  ? acetaminophen (TYLENOL) 500 MG tablet Take 500 mg by mouth every 4 (four) hours as needed for moderate pain.  ? amLODipine (NORVASC) 5 MG tablet Take 5 mg by mouth daily.  ? Ascorbic Acid (VITAMIN C PO) Take 1 tablet by mouth daily in the afternoon.  ? aspirin EC 81 MG EC tablet Take 1 tablet (81 mg total) by mouth daily.  ? atorvastatin (LIPITOR)  80 MG tablet Take 80 mg by mouth daily at 6 PM.   ? Budeson-Glycopyrrol-Formoterol (BREZTRI AEROSPHERE) 160-9-4.8 MCG/ACT AERO Inhale 2 puffs into the lungs in the morning and at bedtime.  ? Budeson-Glycopyrrol-Formoterol (BREZTRI AEROSPHERE) 160-9-4.8 MCG/ACT AERO Inhale 2 puffs into the lungs in the morning and at bedtime.  ? Budeson-Glycopyrrol-Formoterol (BREZTRI AEROSPHERE) 160-9-4.8 MCG/ACT AERO Inhale 2 puffs into the lungs in the morning and at bedtime.  ? Cholecalciferol 2000 UNITS CAPS Take 2,000 Units by mouth daily.   ? FLUoxetine (PROZAC) 10 MG capsule Take 10 mg by mouth daily.  ? gabapentin (NEURONTIN) 600 MG tablet Take 600 mg by mouth at bedtime.   ? levETIRAcetam (KEPPRA) 500 MG tablet Take 500 mg by mouth 2 (two) times daily.  ? levothyroxine (SYNTHROID) 112 MCG tablet Take 112 mcg by mouth daily.  ? lidocaine (LIDODERM) 5 % Place 1 patch onto the skin every 12 (twelve) hours for 15 days.  ? magnesium oxide (MAG-OX) 400 MG tablet Take 400 mg by mouth 2 (two) times daily.  ? Omega-3 Fatty Acids (FISH OIL) 1000 MG CAPS Take 1,000 mg by mouth daily.   ? omeprazole (PRILOSEC) 20 MG capsule  Take 20 mg by mouth daily.  ? VENTOLIN HFA 108 (90 Base) MCG/ACT inhaler Inhale 1-2 puffs into the lungs every 6 (six) hours as needed (for shortness of breath/wheezing.).  ? [DISCONTINUED] lidocaine (LIDODERM) 5 % Place 1 patch onto the skin daily. Remove & Discard patch within 12 hours or as directed by MD  ? [DISCONTINUED] methylPREDNISolone (MEDROL DOSEPAK) 4 MG TBPK tablet Take as directed in the package, this is a taper package.  ? [DISCONTINUED] SPIRIVA RESPIMAT 2.5 MCG/ACT AERS INHALE 2 PUFFS INTO THE LUNGS ONCE DAILY  ? ?Immunization History  ?Administered Date(s) Administered  ? Fluad Quad(high Dose 65+) 01/05/2021  ? Influenza Split 01/20/2014, 05/11/2015  ? Influenza, Seasonal, Injecte, Preservative Fre 02/08/2010, 04/05/2011, 02/03/2012, 01/31/2013  ? Influenza,inj,Quad PF,6+ Mos 01/20/2014, 04/29/2015,  01/28/2016, 05/15/2019, 02/27/2020  ? Influenza-Unspecified 01/30/2014, 04/29/2015, 01/28/2016, 05/15/2019, 02/27/2020  ? Pneumococcal Conjugate-13 04/29/2015  ? Pneumococcal Polysaccharide-23 10/10/

## 2021-07-26 NOTE — Telephone Encounter (Signed)
Auth # [id number] valid 07/23/21 to 11-12-2021 for codes 31652, 31653, [phone] ? ?

## 2021-07-26 NOTE — Telephone Encounter (Signed)
Dr. Patsey Berthold, please see denial and advise. Thanks ?

## 2021-07-26 NOTE — Telephone Encounter (Signed)
Received a fax regarding Prior Authorization from [employer] for LIDOCAINE 1% PATCH. Authorization has been DENIED because THE DRUG DOES NOT MEET THERAPUTIC CRITERIA, HAVING A DIAGNOSIS OF POST-HERPETIC NEURALGIA. ? ? ? ?

## 2021-07-26 NOTE — Telephone Encounter (Signed)
Patient is aware of below message and voiced his understanding.  Nothing further needed.   

## 2021-07-28 ENCOUNTER — Other Ambulatory Visit: Payer: Self-pay

## 2021-07-28 ENCOUNTER — Encounter
Admission: RE | Admit: 2021-07-28 | Discharge: 2021-07-28 | Disposition: A | Discharge status: Home / Self Care | Payer: HMO | Source: Ambulatory Visit | Attending: Pulmonary Disease | Admitting: Pulmonary Disease

## 2021-07-28 NOTE — Patient Instructions (Addendum)
Your procedure is scheduled on: Monday 08/02/21 ?Report to the Registration Desk on the 1st floor of the [facility]. ?To find out your arrival time, please call [phone] between 1PM - 3PM on: Friday 07/30/21 ? ?REMEMBER: ?Instructions that are not followed completely may result in serious medical risk, up to and including death; or upon the discretion of your surgeon and anesthesiologist your surgery may need to be rescheduled. ? ?Do not eat or drink after midnight the night before surgery.  ?No gum chewing, lozengers or hard candies. ? ? ?TAKE THESE MEDICATIONS THE MORNING OF SURGERY WITH A SIP OF WATER: ?amLODipine (NORVASC) 5 MG tablet ?levETIRAcetam (KEPPRA) 500 MG tablet ?levothyroxine (SYNTHROID) 112 MCG tablet ? ?omeprazole (PRILOSEC) 20 MG capsule (take one the night before and one on the morning of surgery - helps to prevent nausea after surgery.) ? ?Use your VENTOLIN HFA 108 (90 Base) MCG/ACT inhaler on the day of surgery and bring to the hospital. ? ?Hold Aspirin starting 07/29/21 until after procedure. ? ?One week prior to surgery: ?Stop Anti-inflammatories (NSAIDS) such as Advil, Aleve, Ibuprofen, Motrin, Naproxen, Naprosyn and Aspirin based products such as Excedrin, Goodys Powder, BC Powder. ? ?Stop taking your Multivitamin  OVER THE COUNTER supplements until after surgery. ?You may however, continue to take Tylenol if needed for pain up until the day of surgery. ? ?No Alcohol for 24 hours before or after surgery. ? ?No Smoking including e-cigarettes for 24 hours prior to surgery.  ?No chewable tobacco products for at least 6 hours prior to surgery.  ?No nicotine patches on the day of surgery. ? ?Do not use any "recreational" drugs for at least a week prior to your surgery.  ?Please be advised that the combination of cocaine and anesthesia may have negative outcomes, up to and including death. ?If you test positive for cocaine, your surgery will be cancelled. ? ?On the morning of surgery brush  your teeth with toothpaste and water, you may rinse your mouth with mouthwash if you wish. ?Do not swallow any toothpaste or mouthwash. ? ?Do not wear jewelry. ? ?Do not wear lotions, powders, or colognes.  ? ?Do not shave body from the neck down 48 hours prior to surgery just in case you cut yourself which could leave a site for infection.  ?Also, freshly shaved skin may become irritated if using the CHG soap. ? ?Do not bring valuables to the hospital. [facility] is not responsible for any missing/lost belongings or valuables.  ? ?Bring your C-PAP to the hospital with you in case you may have to spend the night.  ? ?Notify your doctor if there is any change in your medical condition (cold, fever, infection). ? ?Wear comfortable clothing (specific to your surgery type) to the hospital. ? ?If you are being discharged the day of surgery, you will not be allowed to drive home. ?You will need a responsible adult (18 years or older) to drive you home and stay with you that night.  ? ?If you are taking public transportation, you will need to have a responsible adult (18 years or older) with you. ?Please confirm with your physician that it is acceptable to use public transportation.  ? ?Please call the [facility] Dept. at [phone] if you have any questions about these instructions. ? ?Surgery Visitation Policy: ? ?Patients undergoing a surgery or procedure may have two family members or support persons with them as long as the person is not COVID-19 positive or experiencing its symptoms.  ? ?  Inpatient Visitation:   ? ?Visiting hours are 7 a.m. to 8 p.m. ?Up to four visitors are allowed at one time in a patient room, including children. The visitors may rotate out with other people during the day. One designated support person (adult) may remain overnight.  ?

## 2021-07-29 DIAGNOSIS — J449 Chronic obstructive pulmonary disease, unspecified: Secondary | ICD-10-CM | POA: Diagnosis not present

## 2021-07-30 ENCOUNTER — Other Ambulatory Visit
Admission: RE | Admit: 2021-07-30 | Discharge: 2021-07-30 | Disposition: A | Discharge status: Home / Self Care | Payer: PPO | Source: Ambulatory Visit | Attending: Pulmonary Disease | Admitting: Pulmonary Disease

## 2021-07-30 DIAGNOSIS — Z01812 Encounter for preprocedural laboratory examination: Secondary | ICD-10-CM | POA: Diagnosis not present

## 2021-07-30 DIAGNOSIS — Z20822 Contact with and (suspected) exposure to covid-19: Secondary | ICD-10-CM | POA: Diagnosis not present

## 2021-07-31 LAB — SARS CORONAVIRUS 2 (TAT 6-24 HRS): SARS Coronavirus 2: NEGATIVE

## 2021-08-01 MED ORDER — ORAL CARE MOUTH RINSE
15.0000 mL | Freq: Once | OROMUCOSAL | Status: AC
  Administered 2021-08-02: 15 mL via OROMUCOSAL

## 2021-08-01 MED ORDER — IPRATROPIUM-ALBUTEROL 0.5-2.5 (3) MG/3ML IN SOLN: 3.0000 mL | Freq: Once | RESPIRATORY_TRACT | Status: AC

## 2021-08-01 MED ORDER — CHLORHEXIDINE GLUCONATE 0.12 % MT SOLN: 15.0000 mL | Freq: Once | OROMUCOSAL | Status: AC

## 2021-08-01 MED ORDER — SODIUM CHLORIDE 0.9 % IV SOLN: Freq: Once | INTRAVENOUS | Status: DC

## 2021-08-01 MED ORDER — SODIUM CHLORIDE 0.9 % IV SOLN: INTRAVENOUS | Status: DC

## 2021-08-02 ENCOUNTER — Encounter: Payer: Self-pay | Admitting: Pulmonary Disease

## 2021-08-02 ENCOUNTER — Other Ambulatory Visit: Payer: Self-pay

## 2021-08-02 ENCOUNTER — Ambulatory Visit: Payer: PPO

## 2021-08-02 ENCOUNTER — Ambulatory Visit
Admission: RE | Admit: 2021-08-02 | Discharge: 2021-08-02 | Disposition: A | Discharge status: Home / Self Care | Payer: PPO | Attending: Pulmonary Disease | Admitting: Pulmonary Disease

## 2021-08-02 ENCOUNTER — Encounter
Admission: RE | Disposition: A | Discharge status: Home / Self Care | Payer: Self-pay | Source: Home / Self Care | Attending: Pulmonary Disease

## 2021-08-02 ENCOUNTER — Ambulatory Visit: Payer: PPO | Admitting: Urgent Care

## 2021-08-02 DIAGNOSIS — Z8673 Personal history of transient ischemic attack (TIA), and cerebral infarction without residual deficits: Secondary | ICD-10-CM | POA: Diagnosis not present

## 2021-08-02 DIAGNOSIS — E119 Type 2 diabetes mellitus without complications: Secondary | ICD-10-CM | POA: Insufficient documentation

## 2021-08-02 DIAGNOSIS — E039 Hypothyroidism, unspecified: Secondary | ICD-10-CM | POA: Diagnosis not present

## 2021-08-02 DIAGNOSIS — N289 Disorder of kidney and ureter, unspecified: Secondary | ICD-10-CM | POA: Diagnosis not present

## 2021-08-02 DIAGNOSIS — J449 Chronic obstructive pulmonary disease, unspecified: Secondary | ICD-10-CM | POA: Diagnosis not present

## 2021-08-02 DIAGNOSIS — G4733 Obstructive sleep apnea (adult) (pediatric): Secondary | ICD-10-CM | POA: Insufficient documentation

## 2021-08-02 DIAGNOSIS — J9811 Atelectasis: Secondary | ICD-10-CM | POA: Diagnosis not present

## 2021-08-02 DIAGNOSIS — I10 Essential (primary) hypertension: Secondary | ICD-10-CM | POA: Diagnosis not present

## 2021-08-02 DIAGNOSIS — F17210 Nicotine dependence, cigarettes, uncomplicated: Secondary | ICD-10-CM | POA: Insufficient documentation

## 2021-08-02 DIAGNOSIS — K219 Gastro-esophageal reflux disease without esophagitis: Secondary | ICD-10-CM | POA: Insufficient documentation

## 2021-08-02 DIAGNOSIS — R918 Other nonspecific abnormal finding of lung field: Secondary | ICD-10-CM | POA: Diagnosis not present

## 2021-08-02 DIAGNOSIS — Z6841 Body Mass Index (BMI) 40.0 and over, adult: Secondary | ICD-10-CM | POA: Diagnosis not present

## 2021-08-02 DIAGNOSIS — R590 Localized enlarged lymph nodes: Secondary | ICD-10-CM | POA: Diagnosis not present

## 2021-08-02 HISTORY — PX: VIDEO BRONCHOSCOPY WITH ENDOBRONCHIAL ULTRASOUND: SHX6177

## 2021-08-02 LAB — GLUCOSE, CAPILLARY
Glucose-Capillary: 138 mg/dL — ABNORMAL HIGH (ref 70–99)
Glucose-Capillary: 124 mg/dL — ABNORMAL HIGH (ref 70–99)

## 2021-08-02 SURGERY — BRONCHOSCOPY, WITH EBUS
Anesthesia: General | Laterality: Right

## 2021-08-02 MED ORDER — ROCURONIUM BROMIDE 10 MG/ML (PF) SYRINGE
PREFILLED_SYRINGE | INTRAVENOUS | Status: AC
  Filled 2021-08-02: qty 10

## 2021-08-02 MED ORDER — PROPOFOL 10 MG/ML IV BOLUS
INTRAVENOUS | Status: AC
  Filled 2021-08-02: qty 20

## 2021-08-02 MED ORDER — SUCCINYLCHOLINE CHLORIDE 200 MG/10ML IV SOSY
PREFILLED_SYRINGE | INTRAVENOUS | Status: AC
  Filled 2021-08-02: qty 10

## 2021-08-02 MED ORDER — DEXAMETHASONE SODIUM PHOSPHATE 10 MG/ML IJ SOLN
INTRAMUSCULAR | Status: AC
  Filled 2021-08-02: qty 1

## 2021-08-02 MED ORDER — DEXAMETHASONE SODIUM PHOSPHATE 10 MG/ML IJ SOLN
INTRAMUSCULAR | Status: DC | PRN
  Administered 2021-08-02: 5 mg via INTRAVENOUS

## 2021-08-02 MED ORDER — ONDANSETRON HCL 4 MG/2ML IJ SOLN: 4.0000 mg | Freq: Once | INTRAMUSCULAR | Status: DC | PRN

## 2021-08-02 MED ORDER — FENTANYL CITRATE (PF) 100 MCG/2ML IJ SOLN
INTRAMUSCULAR | Status: AC
  Filled 2021-08-02: qty 2

## 2021-08-02 MED ORDER — ONDANSETRON HCL 4 MG/2ML IJ SOLN
INTRAMUSCULAR | Status: AC
  Filled 2021-08-02: qty 2

## 2021-08-02 MED ORDER — FENTANYL CITRATE (PF) 100 MCG/2ML IJ SOLN
INTRAMUSCULAR | Status: DC | PRN
  Administered 2021-08-02: 50 ug via INTRAVENOUS

## 2021-08-02 MED ORDER — IPRATROPIUM-ALBUTEROL 0.5-2.5 (3) MG/3ML IN SOLN
RESPIRATORY_TRACT | Status: AC
Start: 2021-08-02 — End: 2021-08-02
  Administered 2021-08-02: 3 mL via RESPIRATORY_TRACT
  Filled 2021-08-02: qty 3

## 2021-08-02 MED ORDER — SUGAMMADEX SODIUM 500 MG/5ML IV SOLN
INTRAVENOUS | Status: DC | PRN
  Administered 2021-08-02: 300 mg via INTRAVENOUS

## 2021-08-02 MED ORDER — LIDOCAINE HCL (CARDIAC) PF 100 MG/5ML IV SOSY
PREFILLED_SYRINGE | INTRAVENOUS | Status: DC | PRN
  Administered 2021-08-02: 100 mg via INTRAVENOUS

## 2021-08-02 MED ORDER — ONDANSETRON HCL 4 MG/2ML IJ SOLN
INTRAMUSCULAR | Status: DC | PRN
  Administered 2021-08-02: 4 mg via INTRAVENOUS

## 2021-08-02 MED ORDER — ROCURONIUM BROMIDE 100 MG/10ML IV SOLN
INTRAVENOUS | Status: DC | PRN
  Administered 2021-08-02: 30 mg via INTRAVENOUS

## 2021-08-02 MED ORDER — CHLORHEXIDINE GLUCONATE 0.12 % MT SOLN
OROMUCOSAL | Status: AC
  Filled 2021-08-02: qty 15

## 2021-08-02 MED ORDER — PROPOFOL 10 MG/ML IV BOLUS
INTRAVENOUS | Status: DC | PRN
  Administered 2021-08-02: 150 mg via INTRAVENOUS

## 2021-08-02 MED ORDER — SUCCINYLCHOLINE CHLORIDE 200 MG/10ML IV SOSY
PREFILLED_SYRINGE | INTRAVENOUS | Status: DC | PRN
  Administered 2021-08-02: 140 mg via INTRAVENOUS

## 2021-08-02 MED ORDER — FENTANYL CITRATE (PF) 100 MCG/2ML IJ SOLN: 25.0000 ug | INTRAMUSCULAR | Status: DC | PRN

## 2021-08-02 MED ORDER — LIDOCAINE HCL (PF) 2 % IJ SOLN
INTRAMUSCULAR | Status: AC
  Filled 2021-08-02: qty 5

## 2021-08-02 NOTE — Anesthesia Preprocedure Evaluation (Signed)
Anesthesia Evaluation  ?Patient identified by MRN, date of birth, ID band ?Patient awake ? ? ? ?Reviewed: ?Allergy & Precautions, H&P , NPO status , Patient's Chart, lab work & pertinent test results, reviewed documented beta blocker date and time  ? ?Airway ?Mallampati: II ? ? ?Neck ROM: full ? ? ? Dental ? ?(+) Poor Dentition ?  ?Pulmonary ?neg pulmonary ROS, sleep apnea , pneumonia, resolved, COPD, Current Smoker,  ?  ?Pulmonary exam normal ? ? ? ? ? ? ? Cardiovascular ?hypertension, negative cardio ROS ?Normal cardiovascular exam ?Rhythm:regular Rate:Normal ? ? ?  ?Neuro/Psych ?Seizures -,  Anxiety  Neuromuscular disease CVA, Residual Symptoms negative neurological ROS ? negative psych ROS  ? GI/Hepatic ?Neg liver ROS, GERD  Medicated,  ?Endo/Other  ?diabetesHypothyroidism Morbid obesity ? Renal/GU ?Renal disease  ?negative genitourinary ?  ?Musculoskeletal ? ? Abdominal ?  ?Peds ? Hematology ?negative hematology ROS ?(+)   ?Anesthesia Other Findings ?Past Medical History: ?No date: Anxiety ?    Comment:  mild ?2015: Cancer ([place]) ?    Comment:  tonsil ?No date: COPD (chronic obstructive pulmonary disease) ([place]) ?No date: Diabetes mellitus without complication ([place]) ?    Comment:  controlled with diet ?No date: Elevated cholesterol ?No date: GERD (gastroesophageal reflux disease) ?No date: History of kidney stones ?No date: Hypertension ?No date: Hypothyroidism ?No date: Morbid obesity with BMI of 40.0-44.9, adult ([place]) ?No date: Pneumonia ?    Comment:  klebsiella pneumoniae; extended stay in hospital  ?             requiring tracheotomy and gastrotomy ?2015: Seizures ([place]) ?    Comment:  related to sepsis ?No date: Septic shock ([place]) ?2019: Sleep apnea ?    Comment:  has a Cpap machime  ?2007: Stroke [facility]) ?    Comment:  no peripheral vision on left side of both eyes & left  ?             arm gets numb under stress ?Past Surgical History: ?07/23/2019: CYSTOSCOPY W/ RETROGRADES;  Right ?    Comment:  Procedure: CYSTOSCOPY WITH RETROGRADE PYELOGRAM;   ?             Surgeon: Abbie Sons, MD;  Location: [facility] ORS;   ?             Service: Urology;  Laterality: Right; ?07/05/2019: CYSTOSCOPY WITH STENT PLACEMENT; Right ?    Comment:  Procedure: CYSTOSCOPY WITH STENT PLACEMENT;  Surgeon:  ?             Abbie Sons, MD;  Location: [facility] ORS;  Service:  ?             Urology;  Laterality: Right; ?07/23/2019: CYSTOSCOPY/URETEROSCOPY/HOLMIUM LASER/STENT PLACEMENT;  ?Right ?    Comment:  Procedure: CYSTOSCOPY/URETEROSCOPY/HOLMIUM LASER/STENT  ?             Exchange;  Surgeon: Abbie Sons, MD;  Location: [facility] ?             ORS;  Service: Urology;  Laterality: Right; ?07/30/2019: CYSTOSCOPY/URETEROSCOPY/HOLMIUM LASER/STENT PLACEMENT;  ?Right ?    Comment:  Procedure: CYSTOSCOPY/URETEROSCOPY/HOLMIUM LASER/STENT  ?             Exchange;  Surgeon: Abbie Sons, MD;  Location: [facility] ?             ORS;  Service: Urology;  Laterality: Right; ?03/09/2016: ELECTROMAGNETIC NAVIGATION BROCHOSCOPY; N/A ?    Comment:  Procedure: ELECTROMAGNETIC NAVIGATION BRONCHOSCOPY;   ?  Surgeon: Flora Lipps, MD;  Location: [facility] ORS;  Service:  ?             Cardiopulmonary;  Laterality: N/A; ?No date: GASTROSTOMY TUBE PLACEMENT ?No date: REMOVAL OF GASTROSTOMY TUBE ?2015: TONSILLECTOMY ?No date: TRACHEOSTOMY ?06/08/2020: VIDEO BRONCHOSCOPY WITH ENDOBRONCHIAL NAVIGATION; N/A ?    Comment:  Procedure: VIDEO BRONCHOSCOPY WITH ENDOBRONCHIAL  ?             NAVIGATION;  Surgeon: Tyler Pita, MD;  Location:  ?             [facility] ORS;  Service: Pulmonary;  Laterality: N/A; ?06/08/2020: VIDEO BRONCHOSCOPY WITH ENDOBRONCHIAL ULTRASOUND; N/A ?    Comment:  Procedure: VIDEO BRONCHOSCOPY WITH ENDOBRONCHIAL  ?             ULTRASOUND;  Surgeon: Tyler Pita, MD;  Location:  ?             [facility] ORS;  Service: Pulmonary;  Laterality: N/A; ?BMI   ? Body Mass Index: 41.18 kg/m?  ?  ? Reproductive/Obstetrics ?negative  OB ROS ? ?  ? ? ? ? ? ? ? ? ? ? ? ? ? ?  ?  ? ? ? ? ? ? ? ? ?Anesthesia Physical ?Anesthesia Plan ? ?ASA: 3 ? ?Anesthesia Plan: General  ? ?Post-op Pain Management:   ? ?Induction:  ? ?PONV Risk Score and Plan:  ? ?Airway Management Planned:  ? ?Additional Equipment:  ? ?Intra-op Plan:  ? ?Post-operative Plan:  ? ?Informed Consent: I have reviewed the patients History and Physical, chart, labs and discussed the procedure including the risks, benefits and alternatives for the proposed anesthesia with the patient or authorized representative who has indicated his/her understanding and acceptance.  ? ? ? ?Dental Advisory Given ? ?Plan Discussed with: CRNA ? ?Anesthesia Plan Comments:   ? ? ? ? ? ? ?Anesthesia Quick Evaluation ? ?

## 2021-08-02 NOTE — Discharge Instructions (Signed)
AMBULATORY SURGERY  ?DISCHARGE INSTRUCTIONS ? ? ?The drugs that you were given will stay in your system until tomorrow so for the next 24 hours you should not: ? ?Drive an automobile ?Make any legal decisions ?Drink any alcoholic beverage ? ? ?You may resume regular meals tomorrow.  Today it is better to start with liquids and gradually work up to solid foods. ? ?You may eat anything you prefer, but it is better to start with liquids, then soup and crackers, and gradually work up to solid foods. ? ? ?Please notify your doctor immediately if you have any unusual bleeding, trouble breathing, redness and pain at the surgery site, drainage, fever, or pain not relieved by medication. ? ? ? ?Additional Instructions: ? ? ? ?Please contact your physician with any problems or Same Day Surgery at [phone], Monday through Friday 6 am to 4 pm, or [place] [facility] Main number at [phone].  ?

## 2021-08-02 NOTE — Op Note (Signed)
PROCEDURE (S):  ?BRONCHOSCOPY WITH TRANSBRONCHIAL LUNG BIOPSIES AND BRUSHINGS ?ENDOBRONCHIAL ULTRASOUND WITH TBNA ?Cellvizio probe based confocal laser endomicroscopy (pCLE) ?Fluoroscopy ? ? ?PROCEDURE DATE: 08/02/2021  TIME:  ?NAME:  Shawn Lindsey  DOB:12/09/1950  MRN: [id number] ?LOC:  ARPO/None    HOSP DAY: N/A ?CODE STATUS:  FULL ? ?Indications/Preliminary Diagnosis: Right upper lobe masslike process, multiple subsolid/cavitary lesions bilaterally, rule out cancer ? ?Consent: (Place X beside choice/s below) ? The benefits, risks and possible complications of the procedure were        explained to: ? _X_patient ? ___ patient's family ? ___ other:___________  ?who verbalized understanding and gave: ? ___ verbal ? ___ written ? _X_verbal and written ? ___ telephone ? ___ other:________ consent.  ?  ?  Unable to obtain consent; procedure performed on emergent basis.   ?  Other:   ? ?Benefits, limitations and potential complications of the procedure were discussed with the patient/family.  Complications from bronchoscopy are rare and most often minor, but if they occur they may include breathing difficulty, vocal cord spasm, hoarseness, slight fever, vomiting, dizziness, bronchospasm, infection, low blood oxygen, bleeding from biopsy site, or an allergic reaction to medications.  It is uncommon for patients to experience other more serious complications for example: Collapsed lung requiring chest tube placement, respiratory failure, heart attack and/or cardiac arrhythmia.  Patient agreed to proceed ? ? ?Surgeon: Renold Don, MD ?Assistant/Scrub: Sullivan Lone, RRT ?Circulator: N/A ?Anesthesiologist/CRNA: Vashti Hey, MD/Munesh Pidana ?Radiology technologist: August Saucer, RT ?Cytotechnology: [facility] ? ?Anesthesia: General endotracheal ? ?PROCEDURE DETAILS:  ?Description of Procedure: The patient was taken to Procedure Room 2 (Bronchoscopy Suite) appropriate timeout was taken.  Patient was placed on the fluoroscopy  table.  Patient was then inducted under general anesthesia by the anesthesia team.  He was intubated with an 8.5 ETT without difficulty.  A Portex adapter was placed on the ETT flange.  At this point the Olympus video bronchoscope was advanced through the Portex adapter and anatomic tour of the airway was performed.   The visible trachea was normal, carina was sharp, inspection of the right lung showed architectural distortion of the right upper lobe bronchus with narrowing of the lumen of the right upper lobe posterior subsegment.  Advancing to the right middle lobe on the lateral subsegment there appeared to be narrowing of the lumen.  The remainder of the right middle lobe and lower lobe bronchi had no endobronchial lesions or abnormalities noted.  Inspection of the left lung showed no endobronchial lesions,on left upper lobe, lingula and lower lobe subsegments.there were some inspissated secretions on the lingula subsegment and these were suctioned and lavaged till clear.  At this point attention was then placed to the right upper lobe area of distortion.  Utilizing fluoroscopy brushings were extended into the posterior subsegment orifice and sampling was done x2.  The brush could not be advanced past midpoint due to obstruction.  First pass was subjected to ROSE, this showed lesional cells.  Once this was completed, we proceeded with transbronchial biopsies and biopsies of the orifice opening of the right upper lobe posterior subsegment.  Total biopsies were #10.  Having completed this, bronchoalveolar lavage with attention to the posterior subsegment of the right upper lobe was performed.  40 mL's of saline were instilled yielding approximately 12 mL of aliquot.  After sampling of the right upper lobe the bronchoscope was advanced to the narrow subsegment on the right lower lobe.  Cellvizio probe based confocal laser  endomicroscopy (pCLE) was attempted but the probe would not be advanced to the target area due  to meeting resistance and good images could not be obtained.  With the inability to see good images sampling of this area was not performed.  Once this was completed the airway was examined for hemostasis and the bronchoscope was retrieved and exchanged for Olympus endobronchial ultrasound (EBUS) scope.  At this point the mediastinum was examined there was a small lymph node noted in the subcarinal space, measuring 1.5 x 1.0 cm.  Utilizing a Olympus 21-gauge EBUS needle transbronchial needle aspirates were performed x4 through the left subcarinal space.  Material was placed in CytoLyt and RPMI media.  Having completed this portion of the procedure there was minimal heme noted on the left mainstem bronchus, this was lavaged until clear.  Patient then received 10 mL of 1% lidocaine via bronchial lavage and the bronchoscope was retrieved.  The procedure was at this point terminated.  Patient was allowed to emerge from general anesthesia and was transferred to the PACU in satisfactory condition. ? ? ?SPECIMENS (Sites): (Place X beside choice below) ? Specimens Description  ? No Specimens Obtained    ? Washings   ?X Lavage RUL 12 ml  ?X Biopsies RUL X 10 transbronchial  ?X Fine Needle Aspirates Subcarinal X 4  ?X Brushings RUL X 2 passes  ? Sputum   ? ?FINDINGS:  ? ?Distorted architecture of right upper lobe posterior subsegment with narrowed lumen: ? ? ?Closer review of narrowing and submucosal mass effect right upper lobe posterior subsegment: ? ? ?Postbiopsy showing good hemostasis: ? ? ?Small subcarinal lymph node 1.5 x 1.0 cm subcarinal space: ? ? ?EBUS needle in lymph node during biopsy: ? ? ?Fluoroscopic image: ? ? ?Fluoroscopy utilized for biopsy guidance, total fluoroscopy time 70 seconds, dose 19.3 mGy. ? ?ESTIMATED BLOOD LOSS: < 51m ? ?COMPLICATIONS/RESOLUTION: none ?Post procedure chest x-ray shows no pneumothorax: ? ? ? ?IMPRESSION:POST-PROCEDURE DX:  ? 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 ? ?RECOMMENDATION/PLAN:  ?Await pathology report ?Patient will need further imaging for staging ?Oncology referral once final diagnosis known ? ? ? ?C. LDerrill Kay MD ?Advanced Bronchoscopy ?PCCM [facility] ? ? ? ?*This note was dictated using voice recognition software/Dragon.  Despite best efforts to proofread, errors can occur which can change the meaning. Any transcriptional errors that result from this process are unintentional and may not be fully corrected at the time of dictation. ? ? ? ? ?  ?

## 2021-08-02 NOTE — Interval H&P Note (Signed)
History and Physical Interval Note: ? ?08/02/2021 ?11:49 AM ? ?Shawn Lindsey  has presented today for surgery, with the diagnosis of MULTIPLE LUNG NODULES RIGHT.  The various methods of treatment have been discussed with the patient and family. After consideration of risks, benefits and other options for treatment, the patient has consented to  Procedure(s): ?BRONCHOSCOPY WITH ENDOBRONCHIAL ULTRASOUND (Right) as a surgical intervention.  The patient's history has been reviewed, patient examined, no change in status, stable for surgery.  I have reviewed the patient's chart and labs.  Questions were answered to the patient's satisfaction.   ? ? ?Vernard Gambles ? ? ?

## 2021-08-02 NOTE — Transfer of Care (Signed)
Immediate Anesthesia Transfer of Care Note ? ?Patient: Shawn Lindsey ? ?Procedure(s) Performed: BRONCHOSCOPY WITH ENDOBRONCHIAL ULTRASOUND (Right) ? ?Patient Location: PACU ? ?Anesthesia Type:General ? ?Level of Consciousness: awake ? ?Airway & Oxygen Therapy: Patient Spontanous Breathing and Patient connected to face mask oxygen ? ?Post-op Assessment: Report given to RN and Post -op Vital signs reviewed and stable ? ?Post vital signs: Reviewed and stable ? ?Last Vitals:  ?Vitals Value Taken Time  ?BP 133/83 08/02/21 1400  ?Temp 36.2 ?C 08/02/21 1358  ?Pulse 69 08/02/21 1401  ?Resp 20 08/02/21 1401  ?SpO2 100 % 08/02/21 1401  ?Vitals shown include unvalidated device data. ? ?Last Pain:  ?Vitals:  ? 08/02/21 1400  ?TempSrc:   ?PainSc: 0-No pain  ?   ? ?  ? ?Complications: No notable events documented. ?

## 2021-08-02 NOTE — Anesthesia Procedure Notes (Signed)
Procedure Name: Intubation ?Date/Time: 08/02/2021 12:56 PM ?Performed by: Cammie Sickle, CRNA ?Pre-anesthesia Checklist: Patient identified, Patient being monitored, Timeout performed, Emergency Drugs available and Suction available ?Patient Re-evaluated:Patient Re-evaluated prior to induction ?Oxygen Delivery Method: Circle system utilized ?Preoxygenation: Pre-oxygenation with 100% oxygen ?Induction Type: IV induction ?Ventilation: Mask ventilation without difficulty and Two handed mask ventilation required ?Laryngoscope Size: 3 and McGraph ?Grade View: Grade I ?Tube type: Oral ?Tube size: 8.5 mm ?Number of attempts: 1 ?Airway Equipment and Method: Stylet ?Placement Confirmation: ETT inserted through vocal cords under direct vision, positive ETCO2 and breath sounds checked- equal and bilateral ?Secured at: 22 cm ?Tube secured with: Tape ?Dental Injury: Teeth and Oropharynx as per pre-operative assessment  ? ? ? ? ?

## 2021-08-03 ENCOUNTER — Encounter: Payer: Self-pay | Admitting: Pulmonary Disease

## 2021-08-03 LAB — CYTOLOGY - NON PAP

## 2021-08-03 LAB — SURGICAL PATHOLOGY

## 2021-08-03 NOTE — Anesthesia Postprocedure Evaluation (Signed)
Anesthesia Post Note ? ?Patient: Shawn Lindsey ? ?Procedure(s) Performed: BRONCHOSCOPY WITH ENDOBRONCHIAL ULTRASOUND (Right) ? ?Patient location during evaluation: PACU ?Anesthesia Type: General ?Level of consciousness: awake and alert ?Pain management: pain level controlled ?Vital Signs Assessment: post-procedure vital signs reviewed and stable ?Respiratory status: spontaneous breathing, nonlabored ventilation and respiratory function stable ?Cardiovascular status: blood pressure returned to baseline and stable ?Postop Assessment: no apparent nausea or vomiting ?Anesthetic complications: no ? ? ?No notable events documented. ? ? ?Last Vitals:  ?Vitals:  ? 08/02/21 1500 08/02/21 1518  ?BP: (!) 141/73 (!) 149/70  ?Pulse: 69 75  ?Resp: 13 16  ?Temp:    ?SpO2: 97% 94%  ?  ?Last Pain:  ?Vitals:  ? 08/02/21 1518  ?TempSrc: Temporal  ?PainSc: 0-No pain  ? ? ?  ?  ?  ?  ?  ?  ? ?[place] Ouch ? ? ? ? ?

## 2021-08-04 ENCOUNTER — Ambulatory Visit: Payer: Medicare HMO | Admitting: Internal Medicine

## 2021-08-04 ENCOUNTER — Telehealth: Payer: Self-pay | Admitting: Pulmonary Disease

## 2021-08-04 DIAGNOSIS — J449 Chronic obstructive pulmonary disease, unspecified: Secondary | ICD-10-CM

## 2021-08-04 NOTE — Telephone Encounter (Signed)
Spoke to patient. He stated that she reviewed pathology results and he does not have a clear understanding.  ?He would like a call from Dr. Patsey Berthold directly to discuss results. ? ?Dr. Patsey Berthold, please advise. Thanks ?

## 2021-08-05 NOTE — Telephone Encounter (Signed)
Discussed findings with patient.  He will need the following lab work: Sed rate (ESR), CRP, hypersensitivity pneumonitis panel, ANA with reflex, ACE level, rheumatoid factor, CBC with differential. ?

## 2021-08-05 NOTE — Telephone Encounter (Signed)
Labs have been ordered.  ?Patient is aware and voiced his understanding.  ?He stated that he would come by next week for labs. ?Nothing further needed.  ? ?

## 2021-08-09 ENCOUNTER — Other Ambulatory Visit
Admission: RE | Admit: 2021-08-09 | Discharge: 2021-08-09 | Disposition: A | Discharge status: Home / Self Care | Payer: PPO | Attending: Pulmonary Disease | Admitting: Pulmonary Disease

## 2021-08-09 ENCOUNTER — Telehealth: Payer: Self-pay | Admitting: Pulmonary Disease

## 2021-08-09 DIAGNOSIS — J449 Chronic obstructive pulmonary disease, unspecified: Secondary | ICD-10-CM | POA: Insufficient documentation

## 2021-08-09 LAB — CBC WITH DIFFERENTIAL/PLATELET
Abs Immature Granulocytes: 0.11 10*3/uL — ABNORMAL HIGH (ref 0.00–0.07)
Basophils Absolute: 0.1 10*3/uL (ref 0.0–0.1)
Basophils Relative: 0 %
Eosinophils Absolute: 0.2 10*3/uL (ref 0.0–0.5)
Eosinophils Relative: 2 %
HCT: 45 % (ref 39.0–52.0)
Hemoglobin: 14.5 g/dL (ref 13.0–17.0)
Immature Granulocytes: 1 %
Lymphocytes Relative: 12 %
Lymphs Abs: 1.6 10*3/uL (ref 0.7–4.0)
MCH: 29.8 pg (ref 26.0–34.0)
MCHC: 32.2 g/dL (ref 30.0–36.0)
MCV: 92.4 fL (ref 80.0–100.0)
Monocytes Absolute: 0.8 10*3/uL (ref 0.1–1.0)
Monocytes Relative: 6 %
Neutro Abs: 10.8 10*3/uL — ABNORMAL HIGH (ref 1.7–7.7)
Neutrophils Relative %: 79 %
Platelets: 251 10*3/uL (ref 150–400)
RBC: 4.87 MIL/uL (ref 4.22–5.81)
RDW: 15 % (ref 11.5–15.5)
WBC: 13.5 10*3/uL — ABNORMAL HIGH (ref 4.0–10.5)
nRBC: 0 % (ref 0.0–0.2)

## 2021-08-09 LAB — C-REACTIVE PROTEIN: CRP: 4 mg/dL — ABNORMAL HIGH (ref <1.0)

## 2021-08-09 LAB — SEDIMENTATION RATE: Sed Rate: 31 mm/hr — ABNORMAL HIGH (ref 0–20)

## 2021-08-09 NOTE — Telephone Encounter (Signed)
Called and spoke with patient, states that Dr. Patsey Berthold said she did not find cancer during his procedure, but did find infection.  He was asking if she was going to prescribe something for that infection.  He had lab work done today.  I advised him that once she reviews the lab work she will have [place] call him with the results and her recommendations.  He verbalized understanding.  He is not running any fever or coughing up any mucous that is yellow or green. ? ?Dr. Patsey Berthold, ?Please advise once you have had a chance to review his lab work.  Thank you. ?

## 2021-08-10 LAB — ANGIOTENSIN CONVERTING ENZYME: Angiotensin-Converting Enzyme: 53 U/L (ref 14–82)

## 2021-08-10 LAB — RHEUMATOID FACTOR: Rheumatoid fact SerPl-aCnc: <10 IU/mL (ref <14.0)

## 2021-08-10 LAB — ANA W/REFLEX: Anti Nuclear Antibody (ANA): NEGATIVE

## 2021-08-10 NOTE — Telephone Encounter (Signed)
Called and spoke with patient, provided response from Dr. Patsey Berthold.  He verbalized understanding.  Nothing further needed at this time. ?

## 2021-08-10 NOTE — Telephone Encounter (Signed)
What he has is inflammation.  Not infection.  Not all of the blood work is back.  Once I get that back we will make a treatment plan. ?

## 2021-08-13 MED ORDER — METHYLPREDNISOLONE 4 MG PO TBPK: ORAL_TABLET | ORAL | 0 refills | Status: DC

## 2021-08-13 MED ORDER — AZITHROMYCIN 250 MG PO TABS: 250.0000 mg | ORAL_TABLET | ORAL | 2 refills | Status: DC

## 2021-08-13 NOTE — Telephone Encounter (Signed)
Lets send a Medrol Dosepak for him.  Also Azithromycin 250 mg 1 tablet Monday Wednesday Friday this would be a standing dose medication not the Z-Pak so we will write for 12 tablets with 2 refills. ?

## 2021-08-13 NOTE — Telephone Encounter (Signed)
Called and spoke with patient, advised of recommendations per Dr. Patsey Berthold.  He verbalized understanding.  Verified pharmacy and scripts sent.  Nothing further needed. ?

## 2021-08-13 NOTE — Telephone Encounter (Signed)
Primary Pulmonologist: Patsey Berthold ?Last office visit and with whom: 07/23/21 Patsey Berthold ?What do we see them for (pulmonary problems): Mass of RUL, COPD, Pleuric chest pain, OSA ?Last OV assessment/plan:  ? ?Assessment & Plan:  ?  ?    ICD-10-CM    ?1. Mass of upper lobe of right lung  R91.8    ?  Carcinoma until proven otherwise ?We will need biopsy for diagnosis ?Plan for biopsy with fluoroscopic guidance for biopsies  ?   ?2. Multiple lung nodules  R91.8    ?  May be multifocal adenocarcinoma ?Biopsies as above ?Will need staging with EBUS  ?   ?3. Stage 2 moderate COPD by GOLD classification ([place])  J44.9    ?  Continue Breztri 2 puffs twice a day ?Continue as needed albuterol  ?   ?4. Pleuritic chest pain  R07.81    ?  Lidocaine patch ?Avoid nonsteroidals ?As needed Tylenol extra strength  ?   ?5. Morbid obesity with BMI of 40.0-44.9, adult ([place])  E66.01    ?  Z68.41    ?  This issue adds complexity to his management  ?   ?6. OSA (obstructive sleep apnea)  G47.33    ?  Noncompliant with CPAP  ?   ?7. Tobacco dependence due to cigarettes  F17.210    ?  Patient counseled regards to discontinuation of smoking ?Total counseling time 3 to 5 minutes  ?   ?  ?     ?Meds ordered this encounter  ?Medications  ? DISCONTD: lidocaine (LIDODERM) 5 %  ?    Sig: Place 1 patch onto the skin daily. Remove & Discard patch within 12 hours or as directed by MD  ?    Dispense:  30 patch  ?    Refill:  1  ? lidocaine (LIDODERM) 5 %  ?    Sig: Place 1 patch onto the skin every 12 (twelve) hours for 15 days.  ?    Dispense:  15 patch  ?    Refill:  1  ? Budeson-Glycopyrrol-Formoterol (BREZTRI AEROSPHERE) 160-9-4.8 MCG/ACT AERO  ?    Sig: Inhale 2 puffs into the lungs in the morning and at bedtime.  ?    Dispense:  5.9 g  ?    Refill:  0  ?    Order Specific Question:   Lot Number?  ?    Answer:   [id number]  ?    Order Specific Question:   Expiration Date?  ?    Answer:   12/01/2023  ?    Order Specific Question:   Manufacturer?  ?    Answer:    [employer] [[id number]]  ?    Order Specific Question:   Quantity  ?    Answer:   1  ?  ?Benefits, limitations and potential complications of the procedure were discussed with the patient/family.  Complications from bronchoscopy are rare and most often minor, but if they occur they may include breathing difficulty, vocal cord spasm, hoarseness, slight fever, vomiting, dizziness, bronchospasm, infection, low blood oxygen, bleeding from biopsy site, or an allergic reaction to medications.  It is uncommon for patients to experience other more serious complications for example: Collapsed lung requiring chest tube placement, respiratory failure, heart attack and/or cardiac arrhythmia.  Patient agrees to proceed. ?  ?Patient's procedure has been scheduled for 3 April at around 12:30 PM.  Because of the patient's multiple comorbidities we will try to do the most expeditious procedure possible  which would be bronchoscopy under general anesthesia with fluoroscopic guidance for biopsies.  We will also perform EBUS with evaluation of the mediastinum and sampling as needed.  The patient will be a poor candidate for robotic bronchoscopy due to high BMI and airway collapsibility as well as length of procedure, trying to limit time under general anesthesia for the patient.  Patient in follow-up in 3 to 4 weeks time he is to contact us prior to that time should any new difficulties arise. ?  ?C. Derrill Kay, MD ?Advanced Bronchoscopy ?PCCM  [facility] ? ? ? ?*This note was dictated using voice recognition software/Dragon.  Despite best efforts to proofread, errors can occur which can change the meaning. Any transcriptional errors that result from this process are unintentional and may not be fully corrected at the time of dictation. ?   ?  ? ? Patient Instructions by Tyler Pita, MD at 07/23/2021 11:30 AM ? ?Author: Tyler Pita, MD Author Type: Physician Filed: 07/23/2021 12:14 PM  ?Note Status: Signed  Cosign: Cosign Not Required Encounter Date: 07/23/2021  ?Editor: Tyler Pita, MD (Physician)      ?       ?  ?Your biopsy will be done on 3 April at 12:30 PM ?  ?Have refills of Breztri and your pharmacy. ?  ?We sent lidocaine patch prescription to your pharmacy which you can put on the area where it hurts on your chest for 12 hours and then take it off. ?  ?Please avoid taking ibuprofen particularly 3 days prior to the procedure.   ?We will see you in follow-up in 3 to 4 weeks time but we will stay in contact with you for any other studies that need to be ordered particularly after the procedure. ?  ?  ?  ?  ?  ? ? ?Orthostatic Vitals Recorded in This Encounter ? ? 07/23/2021  ?[id number]     ?Patient Position: Sitting  ?BP Location: Left Arm  ?Cuff Size: Normal  ? ?Instructions ? ?Your biopsy will be done on 3 April at 12:30 PM ?  ?Have refills of Breztri and your pharmacy. ?  ?We sent lidocaine patch prescription to your pharmacy which you can put on the area where it hurts on your chest for 12 hours and then take it off. ?  ?Please avoid taking ibuprofen particularly 3 days prior to the procedure.   ?We will see you in follow-up in 3 to 4 weeks time but we will stay in contact with you for any other studies that need to be ordered particularly after the procedure.  ?  ? ? ?Was appointment offered to patient (explain)?  no ? ? ?Reason for call:  ?I called and spoke with patient and he says he is having a lot of pain (constant, dull/achy) on right side of chest (under arm)  Says he can feel it on his back when he sits in the chair.  The only thing that reduces it is Ibuprofen and Dr. Patsey Berthold told him she did not want him taking that a lot, but he needs something for the inflammation.  He states his sob is no worse than what is normal for him with his COPD.  I advised him that Dr. Patsey Berthold was doing a procedure and I would send her a message that she would get when she was done and when I hear back from her I would  call him back.  He verbalized understanding. ? ?Dr. Patsey Berthold, ?Please  advise.  Thank you. ? ?(examples of things to ask: : When did symptoms start? Fever? Cough? Productive? Color to sputum? More sputum than usual? Wheezing? Have you needed increased oxygen? Are you taking your respiratory medications? What over the counter measures have you tried?) ? ?No Known Allergies ? ?Immunization History  ?Administered Date(s) Administered  ? Fluad Quad(high Dose 65+) 01/05/2021  ? Influenza Split 01/20/2014, 05/11/2015  ? Influenza, Seasonal, Injecte, Preservative Fre 02/08/2010, 04/05/2011, 02/03/2012, 01/31/2013  ? Influenza,inj,Quad PF,6+ Mos 01/20/2014, 04/29/2015, 01/28/2016, 05/15/2019, 02/27/2020  ? Influenza-Unspecified 01/30/2014, 04/29/2015, 01/28/2016, 05/15/2019, 02/27/2020  ? Pneumococcal Conjugate-13 04/29/2015  ? Pneumococcal Polysaccharide-23 02/08/2010, 05/11/2015  ? Tdap 08/28/2017  ? Zoster, Live 08/03/2012  ?  ?

## 2021-08-13 NOTE — Addendum Note (Signed)
Addended by: Vanessa Barbara on: 08/13/2021 03:42 PM ? ? Modules accepted: Orders ? ?

## 2021-08-16 LAB — HYPERSENSITIVITY PNEUMONITIS
A. Pullulans Abs: NEGATIVE
A.Fumigatus #1 Abs: NEGATIVE
Micropolyspora faeni, IgG: NEGATIVE
Pigeon Serum Abs: NEGATIVE
Thermoact. Saccharii: NEGATIVE
Thermoactinomyces vulgaris, IgG: NEGATIVE

## 2021-08-29 DIAGNOSIS — J449 Chronic obstructive pulmonary disease, unspecified: Secondary | ICD-10-CM | POA: Diagnosis not present

## 2021-09-08 ENCOUNTER — Other Ambulatory Visit: Payer: Self-pay | Admitting: Pulmonary Disease

## 2021-09-24 ENCOUNTER — Encounter: Payer: Self-pay | Admitting: Pulmonary Disease

## 2021-09-24 ENCOUNTER — Ambulatory Visit: Payer: PPO | Admitting: Pulmonary Disease

## 2021-09-24 VITALS — BP 126/78 | HR 64 | Temp 97.8°F | Ht 70.0 in | Wt 287.0 lb

## 2021-09-24 DIAGNOSIS — R0781 Pleurodynia: Secondary | ICD-10-CM | POA: Diagnosis not present

## 2021-09-24 DIAGNOSIS — R918 Other nonspecific abnormal finding of lung field: Secondary | ICD-10-CM

## 2021-09-24 DIAGNOSIS — J449 Chronic obstructive pulmonary disease, unspecified: Secondary | ICD-10-CM

## 2021-09-24 DIAGNOSIS — F17210 Nicotine dependence, cigarettes, uncomplicated: Secondary | ICD-10-CM

## 2021-09-24 DIAGNOSIS — Z6841 Body Mass Index (BMI) 40.0 and over, adult: Secondary | ICD-10-CM

## 2021-09-24 MED ORDER — MELOXICAM 15 MG PO TABS: 15.0000 mg | ORAL_TABLET | Freq: Every day | ORAL | 3 refills | Status: AC

## 2021-09-24 NOTE — Patient Instructions (Signed)
I have sent in a prescription for a medication that is an anti-inflammatory that you take once a day.  Make sure you take it with the largest meal of the day.  Medication is called Mobic.  Continue taking your omeprazole as this will protect your stomach.  We will see you in follow-up in 2 months time with a chest CT at that time.  These call prior to that with any new questions or concerns.

## 2021-09-24 NOTE — Progress Notes (Signed)
Subjective:    Patient ID: Shawn Lindsey, male    DOB: 02-Feb-1951, 71 y.o.   MRN: [id number] Patient Care Team: Leonel Ramsay, MD as PCP - General (Infectious Diseases) Flora Lipps, MD as Consulting Physician (Pulmonary Disease)  Chief Complaint  Patient presents with   Follow-up    SOB with exertion, wheezing in the afternoon and dry cough at times prod with clear to yellow sputum.    HPI  Patient is a 71 year old current smoker (1 PPD) who presents for follow-up after bronchoscopy performed 02 August 2021.  Patient's primary pulmonologist is Dr. Patricia Pesa.  Patient has been followed up for right upper lobe irregular consolidation and prior cavitation with multiple lung nodules.  Prior history of tonsillar cancer. History of prolonged ICU care approximately 6 years ago.  The patient had CT chest on 17 June 2021 that showed increased size on previously noted bilateral patchy nodular appearance of airspace disease. Also multifocal areas seen throughout the right middle lobe and right lower lobe that had increased in size. Patient had undergone 2 prior navigational bronchoscopies first 1 was on 09 March 2016 (by Dr. Mortimer Fries) and the second on 08 June 2020 (by myself) both of which where nondiagnostic and showed inflammatory cells and proteinaceous debris.  Patient also had significant bleeding during the procedure and significant inflammatory changes throughout the airway.  The patient was instructed to follow-up within 4 to 6 weeks however he was lost to follow-up until September 2022, at that time a chest CT was ordered which was finally performed on 16 February with results as noted above.  Patient then was referred for potential robotic bronchoscopy since his prior procedure the patient has shown some decline in functional status and had a significant COPD exacerbation.  We requested repeat imaging  after treatment with antibiotics and prednisone.  The patient was also switched to  [facility] 2 puffs twice a day.  Since that visit the patient notes that the Judithann Sauger has really helped him with his pulmonary status.  He states that he can breathe much better. He continues to be limited on his activities of daily living due to multiple other issues stemming from his prior prolonged ICU stay.  He underwent repeat bronchoscopy this was done without advanced technologies due to trying to limit the patient's time under anesthesia.  The procedure was done on 02 August 2021.  The right upper lobe was biopsied again.  There was no distinct mass but there was architectural distortion in the right upper lobe bronchus with narrowing of the lumen, see photo below.  The first pass with brushings on this area were subjected to ROSE and showed lesional cells.  However, on final path only "atypical" cells were noted.  Endobronchial ultrasound was also performed and TBNA samples were consistent with adequate lymphoid material however no malignancy was noted.  Patient tolerated the procedure well.  He has not had any difficulties since the procedure.  Because of some inflammatory changes in the specimens, we have opted to treat him with Azithromycin 3 times a week.  He is doing well with this.  He is not having any other difficulties.  He does note persistent right-sided chest pain.  Does not endorse any other symptomatology.  TEST/EVENTS :  CT chest on 07/02/2015 RUL cavitary lesions measures 5.0 x 2.2 x 3.6 cm.  Previously 5.4 x 2.5 x 3.5 cm. CT chest 01/05/16 shows RUL cavitary mass 4.8x4.1 CM, previous measurement 4.6 x 3.8CM CT chest 08/2016  no significant change in size of RUL mass CT chest 03/2020 Significant interval-increase in a dense, masslike irregular consolidation primarily involving the right upper lobe, with architectural distortion and volume loss, the dominant component now measuring approximately 7.4 x 7.1 cm, previously 5.4 x 4.1 cm when measured most similarly,there are new and enlarged  satellite nodules in the right upper lobe new and enlarged nodules in the right middle lobe, the largest in the lateral segment measuring 2.4 x 1.8 cm, previously 0.8 x 0.5 cm.There is a new, nonspecific ground-glass opacity of the anterior left upper lobe, which is more ill-defined, measuring 2.0 x 1.1 cm  PET scan January 2022 right greater than left pulmonary hypermetabolic areas of airspace and groundglass consolidation, low-level hypermetabolism within the mediastinal nodes.  Hypermetabolism in the small abdominal nodes favored to be reactive CT chest June 04, 2020 similar appearance of persistent multifocal bilateral areas of patchy groundglass opacities. CT chest 06/17/2021: Multiple bilateral patchy nodular appearing areas of airspace disease largest area of involvement measures 10 cm x 5.6 cm and is seen in the posterior lateral aspect of the right upper lobe increased when compared to prior (6.2 cm x 4.0 cm) corresponds to area of abnormality seen on prior PET/CT.  Overall all areas are worsening. CT chest 07/15/2021:Nodular and masslike areas of ground-glass and mixed consolidation/ground-glass in the lungs bilaterally, new or progressive from 06/04/2020. 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  There is associated obstruction of the posterior segmental right upper lobe bronchus not previously noted.    PFT 12/2015 ratio 72%, FEv1 90% DLCo 57% 6MWT WNL Office Arlyce Harman 01/16/17 Ratio 74% Fev1 92% predicted  Review of Systems A 10 point review of systems was performed and it is as noted above otherwise negative.    Objective:   Physical Exam BP 126/78 (BP Location: Left Arm, Cuff Size: Large)   Pulse 64   Temp 97.8 F (36.6 C) (Temporal)   Ht 5\' 10"  (1.778 m)   Wt 287 lb (130.2 kg) Comment: weight is per pt  SpO2 96%   BMI 41.18 kg/m  GENERAL: Obese gentleman, no acute distress.  Presents in transport chair.  No respiratory distress.  No conversational dyspnea.    HEAD: Normocephalic, atraumatic.  EYES: Pupils equal, round, reactive to light.  No scleral icterus.  MOUTH: Nose/mouth/throat not examined due to masking requirements for COVID 19. NECK: Supple. No thyromegaly. Trachea midline. No JVD.  No adenopathy. PULMONARY: Good air entry bilaterally.  Clear lung fields.  CARDIOVASCULAR: S1 and S2. Regular rate and rhythm.  No rubs, murmurs or gallops heard. ABDOMEN: Obese otherwise benign. MUSCULOSKELETAL: No joint deformity, no clubbing, no edema.  NEUROLOGIC: No focal deficits. SKIN: Intact,warm,dry. PSYCH: Mood and behavior normal.   From bronchoscopy 02 August 2021:     Assessment & Plan:     ICD-10-CM   1. Multiple lung nodules  R91.8 CT CHEST WO CONTRAST   Inconclusive bronchoscopies x3 Continue to monitor expectantly CT chest for follow-up ordered    2. Stage 2 moderate COPD by GOLD classification (HCC)  J44.9    Continue Breztri 2 puffs twice a day Continue as needed albuterol    3. Pleuritic chest pain  R07.81    Trial of Mobic    4. Morbid obesity with BMI of 40.0-44.9, adult ([place])  E66.01    Z68.41    Weight loss recommended    5. Tobacco dependence due to cigarettes  F17.210    Counseled patient with regards discontinuation of  smoking     Orders Placed This Encounter  Procedures   CT CHEST WO CONTRAST    Standing Status:   Future    Number of Occurrences:   1    Standing Expiration Date:   09/25/2022    Scheduling Instructions:     75mo    Order Specific Question:   Preferred imaging location?    Answer:   [place] ordered this encounter  Medications   meloxicam (MOBIC) 15 MG tablet    Sig: Take 1 tablet (15 mg total) by mouth daily. Take with largest meal of the day.    Dispense:  90 tablet    Refill:  3   We will see the patient in follow-up in 2 months time he is to contact us prior to that time should any new difficulties arise.  Renold Don, MD Advanced Bronchoscopy PCCM [place]  [facility]    *This note was dictated using voice recognition software/Dragon.  Despite best efforts to proofread, errors can occur which can change the meaning. Any transcriptional errors that result from this process are unintentional and may not be fully corrected at the time of dictation.

## 2021-09-28 DIAGNOSIS — J449 Chronic obstructive pulmonary disease, unspecified: Secondary | ICD-10-CM | POA: Diagnosis not present

## 2021-10-20 DIAGNOSIS — E1142 Type 2 diabetes mellitus with diabetic polyneuropathy: Secondary | ICD-10-CM | POA: Diagnosis not present

## 2021-10-20 DIAGNOSIS — J449 Chronic obstructive pulmonary disease, unspecified: Secondary | ICD-10-CM | POA: Diagnosis not present

## 2021-10-20 DIAGNOSIS — F324 Major depressive disorder, single episode, in partial remission: Secondary | ICD-10-CM | POA: Diagnosis not present

## 2021-10-20 DIAGNOSIS — E039 Hypothyroidism, unspecified: Secondary | ICD-10-CM | POA: Diagnosis not present

## 2021-10-20 DIAGNOSIS — N1831 Chronic kidney disease, stage 3a: Secondary | ICD-10-CM | POA: Diagnosis not present

## 2021-10-20 DIAGNOSIS — E785 Hyperlipidemia, unspecified: Secondary | ICD-10-CM | POA: Diagnosis not present

## 2021-10-20 DIAGNOSIS — E119 Type 2 diabetes mellitus without complications: Secondary | ICD-10-CM | POA: Diagnosis not present

## 2021-10-20 DIAGNOSIS — R918 Other nonspecific abnormal finding of lung field: Secondary | ICD-10-CM | POA: Diagnosis not present

## 2021-10-20 DIAGNOSIS — I10 Essential (primary) hypertension: Secondary | ICD-10-CM | POA: Diagnosis not present

## 2021-10-20 DIAGNOSIS — G40909 Epilepsy, unspecified, not intractable, without status epilepticus: Secondary | ICD-10-CM | POA: Diagnosis not present

## 2021-10-20 DIAGNOSIS — Z6841 Body Mass Index (BMI) 40.0 and over, adult: Secondary | ICD-10-CM | POA: Diagnosis not present

## 2021-10-29 DIAGNOSIS — J449 Chronic obstructive pulmonary disease, unspecified: Secondary | ICD-10-CM | POA: Diagnosis not present

## 2021-11-03 ENCOUNTER — Other Ambulatory Visit: Payer: Self-pay | Admitting: Pulmonary Disease

## 2021-11-22 DIAGNOSIS — E119 Type 2 diabetes mellitus without complications: Secondary | ICD-10-CM | POA: Diagnosis not present

## 2021-11-22 DIAGNOSIS — E039 Hypothyroidism, unspecified: Secondary | ICD-10-CM | POA: Diagnosis not present

## 2021-11-22 DIAGNOSIS — J449 Chronic obstructive pulmonary disease, unspecified: Secondary | ICD-10-CM | POA: Diagnosis not present

## 2021-11-22 DIAGNOSIS — Z6841 Body Mass Index (BMI) 40.0 and over, adult: Secondary | ICD-10-CM | POA: Diagnosis not present

## 2021-11-22 DIAGNOSIS — E1142 Type 2 diabetes mellitus with diabetic polyneuropathy: Secondary | ICD-10-CM | POA: Diagnosis not present

## 2021-11-22 DIAGNOSIS — R918 Other nonspecific abnormal finding of lung field: Secondary | ICD-10-CM | POA: Diagnosis not present

## 2021-11-22 DIAGNOSIS — E785 Hyperlipidemia, unspecified: Secondary | ICD-10-CM | POA: Diagnosis not present

## 2021-11-22 DIAGNOSIS — I10 Essential (primary) hypertension: Secondary | ICD-10-CM | POA: Diagnosis not present

## 2021-11-22 DIAGNOSIS — N1831 Chronic kidney disease, stage 3a: Secondary | ICD-10-CM | POA: Diagnosis not present

## 2021-11-22 DIAGNOSIS — F324 Major depressive disorder, single episode, in partial remission: Secondary | ICD-10-CM | POA: Diagnosis not present

## 2021-11-28 DIAGNOSIS — J449 Chronic obstructive pulmonary disease, unspecified: Secondary | ICD-10-CM | POA: Diagnosis not present

## 2021-12-13 ENCOUNTER — Telehealth: Payer: Self-pay | Admitting: Pulmonary Disease

## 2021-12-13 NOTE — Telephone Encounter (Signed)
Shawn Lindsey, can you assist with scheduling this CT scan for this patient?

## 2021-12-13 NOTE — Telephone Encounter (Signed)
I have left a message for the patient to call me back to schedule this CT

## 2021-12-21 ENCOUNTER — Ambulatory Visit
Admission: RE | Admit: 2021-12-21 | Discharge: 2021-12-21 | Disposition: A | Discharge status: Home / Self Care | Payer: PPO | Source: Ambulatory Visit | Attending: Pulmonary Disease | Admitting: Pulmonary Disease

## 2021-12-21 DIAGNOSIS — R918 Other nonspecific abnormal finding of lung field: Secondary | ICD-10-CM | POA: Insufficient documentation

## 2021-12-21 DIAGNOSIS — J439 Emphysema, unspecified: Secondary | ICD-10-CM | POA: Diagnosis not present

## 2021-12-21 DIAGNOSIS — R911 Solitary pulmonary nodule: Secondary | ICD-10-CM | POA: Diagnosis not present

## 2021-12-21 DIAGNOSIS — K8020 Calculus of gallbladder without cholecystitis without obstruction: Secondary | ICD-10-CM | POA: Diagnosis not present

## 2021-12-21 DIAGNOSIS — I2510 Atherosclerotic heart disease of native coronary artery without angina pectoris: Secondary | ICD-10-CM | POA: Diagnosis not present

## 2021-12-21 DIAGNOSIS — R590 Localized enlarged lymph nodes: Secondary | ICD-10-CM | POA: Diagnosis not present

## 2021-12-21 DIAGNOSIS — N200 Calculus of kidney: Secondary | ICD-10-CM | POA: Insufficient documentation

## 2021-12-21 DIAGNOSIS — I2721 Secondary pulmonary arterial hypertension: Secondary | ICD-10-CM | POA: Insufficient documentation

## 2021-12-23 ENCOUNTER — Ambulatory Visit: Payer: PPO | Admitting: Pulmonary Disease

## 2021-12-23 ENCOUNTER — Encounter: Payer: Self-pay | Admitting: Pulmonary Disease

## 2021-12-23 VITALS — BP 124/60 | HR 81 | Temp 98.0°F | Ht 70.0 in | Wt 303.0 lb

## 2021-12-23 DIAGNOSIS — J449 Chronic obstructive pulmonary disease, unspecified: Secondary | ICD-10-CM

## 2021-12-23 DIAGNOSIS — R0781 Pleurodynia: Secondary | ICD-10-CM

## 2021-12-23 DIAGNOSIS — Z6841 Body Mass Index (BMI) 40.0 and over, adult: Secondary | ICD-10-CM | POA: Diagnosis not present

## 2021-12-23 DIAGNOSIS — R918 Other nonspecific abnormal finding of lung field: Secondary | ICD-10-CM | POA: Diagnosis not present

## 2021-12-23 DIAGNOSIS — F17210 Nicotine dependence, cigarettes, uncomplicated: Secondary | ICD-10-CM

## 2021-12-23 NOTE — Progress Notes (Signed)
Subjective:    Patient ID: Shawn Lindsey, male    DOB: 1950/12/20, 71 y.o.   MRN: [id number] Patient Care Team: Leonel Ramsay, MD as PCP - General (Infectious Diseases) Flora Lipps, MD as Consulting Physician (Pulmonary Disease)  Chief Complaint  Patient presents with   Follow-up   HPI Patient is a 71 year old current smoker (1 PPD) who presents for follow-up after bronchoscopy performed 02 August 2021.  Patient's primary pulmonologist is Dr. Patricia Pesa.  Patient has been followed up for right upper lobe irregular consolidation and prior cavitation with multiple lung nodules.  Prior history of tonsillar cancer. History of prolonged ICU care approximately 6 years ago.  The patient had CT chest on 17 June 2021 that showed increased size on previously noted bilateral patchy nodular appearance of airspace disease. Also multifocal areas seen throughout the right middle lobe and right lower lobe that had increased in size. Patient had undergone 2 prior navigational bronchoscopies first one was on 09 March 2016 (by Dr. Mortimer Fries) and the second on 08 June 2020 (by myself) both of which where nondiagnostic and showed inflammatory cells and proteinaceous debris.  Patient also had significant bleeding during the procedure and significant inflammatory changes throughout the airway.  The patient was instructed to follow-up within 4 to 6 weeks however he was lost to follow-up until September 2022, at that time a chest CT was ordered which was finally performed on 16 February with results as noted above.  Patient then was referred for potential robotic bronchoscopy since his prior procedure the patient has shown some decline in functional status and had a significant COPD exacerbation.  We requested repeat imaging  after treatment with antibiotics and prednisone.  The patient was also switched to [facility] 2 puffs twice a day.  Since that visit the patient notes that the Judithann Sauger has really helped him with  his pulmonary status.  He states that he can breathe much better. He continues to be limited on his activities of daily living due to multiple other issues stemming from his prior prolonged ICU stay.   He underwent repeat bronchoscopy on 02 August 2021, this was done without advanced technologies due to trying to limit the patient's time under anesthesia. The right upper lobe was biopsied again.  There was no distinct mass but there was architectural distortion in the right upper lobe bronchus with narrowing of the lumen, see op report.  The first pass with brushings on this area were subjected to ROSE and showed lesional cells.  However, on final path only "atypical" cells were noted.  Endobronchial ultrasound was also performed and TBNA samples were consistent with adequate lymphoid material however no malignancy was noted.  The patient has been on Azithromycin 3 times a week and has been free of COPD exacerbations.  Has not noted any other changes.  He continues to have right upper chest pain.  This responds to anti-inflammatories.  He is currently on Mobic but notes that Mobic does not last the whole day.  He also takes gabapentin 600 mg at bedtime for neuropathy.  Unfortunately the patient continues to smoke 1 pack of cigarettes per day.    He had a CT chest without contrast on 22 August which shows consolidation of the right upper lobe with progression to near complete collapse and consolidation of the right upper lobe multiple areas of cystic change and surrounding groundglass opacity and consolidative change again identified and have progressed since the prior examination some of the areas  have enlarged, coalesce and become increasingly solid.  We discussed the findings with the patient.  Also discussed the difficulty in obtaining diagnosis as he has had 3 negative bronchoscopies.  Because of his body habitus percutaneous biopsy would be increased risk and it would be difficult to reach targeted areas.   Discussed options with the patient, neck step would be to reassess with PET/CT.   TEST/EVENTS :  CT chest on 07/02/2015 RUL cavitary lesions measures 5.0 x 2.2 x 3.6 cm.  Previously 5.4 x 2.5 x 3.5 cm. CT chest 01/05/16 shows RUL cavitary mass 4.8x4.1 CM, previous measurement 4.6 x 3.8CM CT chest 08/2016 no significant change in size of RUL mass CT chest 03/2020 Significant interval-increase in a dense, masslike irregular consolidation primarily involving the right upper lobe, with architectural distortion and volume loss, the dominant component now measuring approximately 7.4 x 7.1 cm, previously 5.4 x 4.1 cm when measured most similarly,there are new and enlarged satellite nodules in the right upper lobe new and enlarged nodules in the right middle lobe, the largest in the lateral segment measuring 2.4 x 1.8 cm, previously 0.8 x 0.5 cm.There is a new, nonspecific ground-glass opacity of the anterior left upper lobe, which is more ill-defined, measuring 2.0 x 1.1 cm  PET scan January 2022 right greater than left pulmonary hypermetabolic areas of airspace and groundglass consolidation, low-level hypermetabolism within the mediastinal nodes.  Hypermetabolism in the small abdominal nodes favored to be reactive CT chest June 04, 2020 similar appearance of persistent multifocal bilateral areas of patchy groundglass opacities. CT chest 06/17/2021: Multiple bilateral patchy nodular appearing areas of airspace disease largest area of involvement measures 10 cm x 5.6 cm and is seen in the posterior lateral aspect of the right upper lobe increased when compared to prior (6.2 cm x 4.0 cm) corresponds to area of abnormality seen on prior PET/CT.  Overall all areas are worsening. CT chest 07/15/2021:Nodular and masslike areas of ground-glass and mixed consolidation/ground-glass in the lungs bilaterally, new or progressive from 06/04/2020. 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  There is  associated obstruction of the posterior segmental right upper lobe bronchus not previously noted.  BRONCHOSCOPIES: 03/09/2016 [facility] assisted bronchoscopy, Dr. Mortimer Fries: Biopsy of right upper lobe mass, negative, no granulomas or neoplasm noted. 06/08/2020 [facility] assisted bronchoscopy, Dr. Patsey Berthold: Biopsied right middle lobe lung mass/nodule, nondiagnostic. 08/02/2021 bronchoscopy with fluoroscopy, Dr. Patsey Berthold: Biopsy right upper lobe, nondiagnostic.  Inflammatory changes noted on ROSE, not confirmed on final path.  Review of Systems A 10 point review of systems was performed and it is as noted above otherwise negative.  Patient Active Problem List   Diagnosis Date Noted   Type 2 diabetes mellitus with diabetic polyneuropathy, with long-term current use of insulin ([place])    Nephrolithiasis    Ureterolithiasis    Proteus mirabilis infection    Pneumonia of right upper lobe due to Klebsiella pneumoniae ([place])    Acute renal failure with tubular necrosis ([place])    Acute respiratory failure with hypoxia (HCC)    Septic shock ([place]) 07/05/2019   Mass of upper lobe of right lung 09/29/2015   Cancer of tonsil ([place]) 08/05/2014   HTN (hypertension) 08/05/2014   COPD (chronic obstructive pulmonary disease) ([place]) 08/05/2014   Hyperlipidemia 08/05/2014   Social History   Tobacco Use   Smoking status: Every Day    Packs/day: 1.50    Years: 40.00    Total pack years: 60.00    Types: Cigarettes   Smokeless tobacco:  Never   Tobacco comments:    1ppd - 12/23/2021  not trying to quit.  12/23/2021 hfb  Substance Use Topics   Alcohol use: No   No Known Allergies  Current Meds  Medication Sig   acetaminophen (TYLENOL) 500 MG tablet Take 500 mg by mouth every 4 (four) hours as needed for moderate pain.   amLODipine (NORVASC) 5 MG tablet Take 5 mg by mouth daily.   Ascorbic Acid (VITAMIN C PO) Take 1 tablet by mouth daily in the afternoon.   aspirin EC 81 MG EC tablet Take 1 tablet (81 mg total) by mouth  daily.   atorvastatin (LIPITOR) 80 MG tablet Take 80 mg by mouth daily at 6 PM.    azithromycin (ZITHROMAX) 250 MG tablet TAKE 1 TABLET BY MOUTH 3 TIMES A WEEK TAKE ON MONDAY  WEDNESDAY  AND FRIDAY   Budeson-Glycopyrrol-Formoterol (BREZTRI AEROSPHERE) 160-9-4.8 MCG/ACT AERO Inhale 2 puffs into the lungs in the morning and at bedtime.   Cholecalciferol 2000 UNITS CAPS Take 2,000 Units by mouth daily.   FLUoxetine (PROZAC) 10 MG capsule Take 10 mg by mouth daily.   gabapentin (NEURONTIN) 600 MG tablet Take 600 mg by mouth at bedtime.    levETIRAcetam (KEPPRA) 500 MG tablet Take 500 mg by mouth 2 (two) times daily.   levothyroxine (SYNTHROID) 112 MCG tablet Take 112 mcg by mouth daily.   magnesium oxide (MAG-OX) 400 MG tablet Take 400 mg by mouth 2 (two) times daily.   meloxicam (MOBIC) 15 MG tablet Take 1 tablet (15 mg total) by mouth daily. Take with largest meal of the day.   Multiple Vitamin (MULTIVITAMIN) tablet Take 1 tablet by mouth daily.   Omega-3 Fatty Acids (FISH OIL) 1000 MG CAPS Take 1,000 mg by mouth daily.   omeprazole (PRILOSEC) 20 MG capsule Take 20 mg by mouth daily.   VENTOLIN HFA 108 (90 Base) MCG/ACT inhaler Inhale 1-2 puffs into the lungs every 6 (six) hours as needed (for shortness of breath/wheezing.).   Immunization History  Administered Date(s) Administered   Fluad Quad(high Dose 65+) 01/05/2021   Influenza Split 01/20/2014, 05/11/2015   Influenza, Seasonal, Injecte, Preservative Fre 02/08/2010, 04/05/2011, 02/03/2012, 01/31/2013   Influenza,inj,Quad PF,6+ Mos 01/20/2014, 04/29/2015, 01/28/2016, 05/15/2019, 02/27/2020   Influenza-Unspecified 01/30/2014, 04/29/2015, 01/28/2016, 05/15/2019, 02/27/2020   Pneumococcal Conjugate-13 04/29/2015   Pneumococcal Polysaccharide-23 02/08/2010, 05/11/2015   Tdap 08/28/2017   Zoster, Live 08/03/2012       Objective:   Physical Exam BP 124/60 (BP Location: Left Arm, Patient Position: Sitting, Cuff Size: Large)   Pulse 81    Temp 98 F (36.7 C) (Oral)   Ht 5\' 10"  (1.778 m)   Wt (!) 303 lb (137.4 kg)   SpO2 93%   BMI 43.48 kg/m  GENERAL: Obese gentleman, no acute distress.  Presents in transport chair.  No respiratory distress.  No conversational dyspnea.   HEAD: Normocephalic, atraumatic.  EYES: Pupils equal, round, reactive to light.  No scleral icterus.  MOUTH: Nose/mouth/throat not examined due to masking requirements for COVID 19. NECK: Supple. No thyromegaly. Trachea midline. No JVD.  No adenopathy. PULMONARY: Good air entry bilaterally.  Diminished breath sounds on the R upper lung zone, some rhonchi noted right midlung field.  CARDIOVASCULAR: S1 and S2. Regular rate and rhythm.  No rubs, murmurs or gallops heard. ABDOMEN: Obese otherwise benign. MUSCULOSKELETAL: No joint deformity, no clubbing, no edema.  NEUROLOGIC: No focal deficits. SKIN: Intact,warm,dry. PSYCH: Mood and behavior normal.      Assessment &  Plan:     ICD-10-CM   1. Mass of upper lobe of right lung  R91.8 NM PET Image Restage (PS) Skull Base to Thigh (F-18 FDG)   Reassess with PET/CT    2. Multiple lung nodules  R91.8    Reassess with PET/CT    3. 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 Pleuritic chest pain  R07.81    Continue Mobic once daily Increase gabapentin to 600 mg twice daily    5. Morbid obesity with BMI of 40.0-44.9, adult (HCC)  E66.01    Z68.41    Tissue adds complexity to his management Weight loss recommended    6. Tobacco dependence due to cigarettes  F17.210    Patient counseled regards to discontinuation of smoking     Orders Placed This Encounter  Procedures   NM PET Image Restage (PS) Skull Base to Thigh (F-18 FDG)    Standing Status:   Future    Standing Expiration Date:   12/24/2022    Scheduling Instructions:     Next available.    Order Specific Question:   If indicated for the ordered procedure, I authorize the  administration of a radiopharmaceutical per Radiology protocol    Answer:   Yes    Order Specific Question:   Preferred imaging location?    Answer:   [facility]   We will see the patient in follow-up in 4 to 6 weeks time he is to call sooner should any new problems arise.  We will notify patient of the results of PET/CT and need for potential repeat biopsy  C. Derrill Kay, MD Advanced Bronchoscopy PCCM [place] [facility]    *This note was dictated using voice recognition software/Dragon.  Despite best efforts to proofread, errors can occur which can change the meaning. Any transcriptional errors that result from this process are unintentional and may not be fully corrected at the time of dictation.

## 2021-12-23 NOTE — Patient Instructions (Signed)
You can take your gabapentin twice a day to see if this helps with your pain.  We will get a PET/CT to see where we are with the spots in your lung.  We will see you in follow-up in 4 to 6 weeks time call sooner should any new problems arise.

## 2021-12-24 ENCOUNTER — Encounter: Payer: Self-pay | Admitting: Pulmonary Disease

## 2021-12-29 DIAGNOSIS — J449 Chronic obstructive pulmonary disease, unspecified: Secondary | ICD-10-CM | POA: Diagnosis not present

## 2022-01-12 ENCOUNTER — Ambulatory Visit
Admission: RE | Admit: 2022-01-12 | Discharge: 2022-01-12 | Disposition: A | Discharge status: Home / Self Care | Payer: PPO | Source: Ambulatory Visit | Attending: Pulmonary Disease | Admitting: Pulmonary Disease

## 2022-01-12 DIAGNOSIS — Z85818 Personal history of malignant neoplasm of other sites of lip, oral cavity, and pharynx: Secondary | ICD-10-CM | POA: Diagnosis not present

## 2022-01-12 DIAGNOSIS — I2510 Atherosclerotic heart disease of native coronary artery without angina pectoris: Secondary | ICD-10-CM | POA: Insufficient documentation

## 2022-01-12 DIAGNOSIS — R918 Other nonspecific abnormal finding of lung field: Secondary | ICD-10-CM | POA: Insufficient documentation

## 2022-01-12 DIAGNOSIS — Z8589 Personal history of malignant neoplasm of other organs and systems: Secondary | ICD-10-CM | POA: Diagnosis not present

## 2022-01-12 DIAGNOSIS — R590 Localized enlarged lymph nodes: Secondary | ICD-10-CM | POA: Diagnosis not present

## 2022-01-12 DIAGNOSIS — I700 Atherosclerosis of aorta: Secondary | ICD-10-CM | POA: Diagnosis not present

## 2022-01-12 DIAGNOSIS — J90 Pleural effusion, not elsewhere classified: Secondary | ICD-10-CM | POA: Insufficient documentation

## 2022-01-12 LAB — GLUCOSE, CAPILLARY: Glucose-Capillary: 118 mg/dL — ABNORMAL HIGH (ref 70–99)

## 2022-01-12 MED ORDER — FLUDEOXYGLUCOSE F - 18 (FDG) INJECTION
15.7000 | Freq: Once | INTRAVENOUS | Status: AC | PRN
  Administered 2022-01-12: 16.57 via INTRAVENOUS

## 2022-01-20 ENCOUNTER — Other Ambulatory Visit: Payer: Self-pay | Admitting: Pulmonary Disease

## 2022-01-21 ENCOUNTER — Telehealth: Payer: Self-pay | Admitting: Pulmonary Disease

## 2022-01-21 MED ORDER — DOXYCYCLINE HYCLATE 100 MG PO TABS: 100.0000 mg | ORAL_TABLET | Freq: Two times a day (BID) | ORAL | 0 refills | Status: DC

## 2022-01-21 MED ORDER — METHYLPREDNISOLONE 4 MG PO TBPK: ORAL_TABLET | ORAL | 0 refills | Status: DC

## 2022-01-21 NOTE — Telephone Encounter (Signed)
Lets call him in a Medrol Dosepak and doxycycline 100 mg twice daily x7 days.  He is supposed to see me on Tuesday and we can follow-up on all these issues and discuss his PET/CT on follow-up.

## 2022-01-21 NOTE — Telephone Encounter (Signed)
Called and spoke to patient.  He stated that he has PET 01/12/2022. He had a difficult time completing PET scan due to back pain. Since he has been experiencing increased SOB, prod cough with clear sputum, wheezing, right side chest discomfort  that radiates into back. Denied f/c/s or additional sx.  He is concerned about PNA due to his hx. Negative covid test yesterday.  He is using albuterol BID and Breztri BID.   Dr. Patsey Berthold, please advise. Thanks

## 2022-01-21 NOTE — Telephone Encounter (Signed)
Patient is aware of below message/recommendations and voiced his understanding.  Doxycycline and medrol has been sent to preferred pharmacy.  Patient will keep scheduled visit. Nothing further needed.

## 2022-01-25 ENCOUNTER — Encounter: Payer: Self-pay | Admitting: Pulmonary Disease

## 2022-01-25 ENCOUNTER — Ambulatory Visit (INDEPENDENT_AMBULATORY_CARE_PROVIDER_SITE_OTHER): Payer: PPO | Admitting: Pulmonary Disease

## 2022-01-25 VITALS — BP 130/80 | HR 73 | Wt 303.0 lb

## 2022-01-25 DIAGNOSIS — Z6841 Body Mass Index (BMI) 40.0 and over, adult: Secondary | ICD-10-CM | POA: Diagnosis not present

## 2022-01-25 DIAGNOSIS — R0781 Pleurodynia: Secondary | ICD-10-CM

## 2022-01-25 DIAGNOSIS — F17210 Nicotine dependence, cigarettes, uncomplicated: Secondary | ICD-10-CM | POA: Diagnosis not present

## 2022-01-25 DIAGNOSIS — J449 Chronic obstructive pulmonary disease, unspecified: Secondary | ICD-10-CM | POA: Diagnosis not present

## 2022-01-25 DIAGNOSIS — R918 Other nonspecific abnormal finding of lung field: Secondary | ICD-10-CM

## 2022-01-25 MED ORDER — ALBUTEROL SULFATE (2.5 MG/3ML) 0.083% IN NEBU: 2.5000 mg | INHALATION_SOLUTION | Freq: Four times a day (QID) | RESPIRATORY_TRACT | 12 refills | Status: AC | PRN

## 2022-01-25 MED ORDER — COMPRESSOR/NEBULIZER MISC: 0 refills | Status: DC

## 2022-01-25 MED ORDER — GABAPENTIN 600 MG PO TABS: 600.0000 mg | ORAL_TABLET | Freq: Two times a day (BID) | ORAL | 11 refills | Status: AC

## 2022-01-25 NOTE — Progress Notes (Signed)
Subjective:    Patient ID: Shawn Lindsey, male    DOB: [birth year], 71 y.o.   MRN: [id number] Patient Care Team: Leonel Ramsay, MD as PCP - General (Infectious Diseases) Flora Lipps, MD as Consulting Physician (Pulmonary Disease)  Chief Complaint  Patient presents with   Follow-up    SOB. Coughing up brown sputum. 3L of oxygen at night.    HPI Patient is a 71 year old current smoker (1 PPD) who presents for follow-up after bronchoscopy performed 02 August 2021.  Patient's primary pulmonologist is Dr. Patricia Pesa.  Patient has been followed up for right upper lobe irregular consolidation and prior cavitation with multiple lung nodules.  Prior history of tonsillar cancer. History of prolonged ICU care approximately 6 years ago.  The patient had CT chest on 17 June 2020 that showed increased size on previously noted bilateral patchy nodular appearance of airspace disease. Also multifocal areas seen throughout the right middle lobe and right lower lobe that had increased in size. Patient had undergone 2 prior navigational bronchoscopies first one was on 09 March 2016 (by Dr. Mortimer Fries) and the second on 08 June 2020 (by myself) both of which where nondiagnostic and showed inflammatory cells and proteinaceous debris.  Patient also had significant bleeding during the procedure and significant inflammatory changes throughout the airway.  The patient was instructed to follow-up within 4 to 6 weeks however he was lost to follow-up until September 2022, at that time a chest CT was ordered which was finally performed on 16 February with results as noted above.  Patient then was referred for potential robotic bronchoscopy since his prior procedure the patient has shown some decline in functional status and had a significant COPD exacerbation.  We requested repeat imaging  after treatment with antibiotics and prednisone.  The patient was also switched to [facility] 2 puffs twice a day.  Since that visit the  patient notes that the Judithann Sauger has really helped him with his pulmonary status.  He states that he can breathe much better. He continues to be limited on his activities of daily living due to multiple other issues stemming from his prior prolonged ICU stay.   He underwent repeat bronchoscopy on 02 August 2021, this was done without advanced technologies due to trying to limit the patient's time under anesthesia. The right upper lobe was biopsied again.  There was no distinct mass but there was architectural distortion in the right upper lobe bronchus with narrowing of the lumen, see op report.  The first pass with brushings on this area were subjected to ROSE and showed lesional cells.  However, on final path only "atypical" cells were noted. Endobronchial ultrasound was also performed and TBNA samples were consistent with adequate lymphoid material however no malignancy was noted.   The patient has been on Azithromycin 3 times a week and has been free of COPD exacerbations.  Has not noted any other changes.  He continues to have right upper chest pain.  This responds to anti-inflammatories.  He is currently on Mobic but notes that Mobic does not last the whole day.  He also takes gabapentin 600 mg at bedtime for neuropathy.  Unfortunately the patient continues to smoke 1 pack of cigarettes per day.     He had a CT chest without contrast on 22 August which shows consolidation of the right upper lobe with progression to near complete collapse and consolidation of the right upper lobe multiple areas of cystic change and surrounding groundglass opacity and  consolidative change again identified and have progressed since the prior examination some of the areas have enlarged, coalesce and become increasingly solid.  We discussed the findings with the patient.  Also discussed the difficulty in obtaining diagnosis as he has had 3 negative bronchoscopies.  Because of his body habitus percutaneous biopsy make it difficult  to reach targeted areas. Discussed options with the patient, next step was to reassess with PET/CT.  He had a PET/CT that showed large right upper lobe mass with volume loss that was FDG avid.  6 areas of septal thickening and groundglass on the right chest with multifocal nodularity suspicious for satellite nodules with a focal groundglass and septal thickening of the right chest, significant right thyroid uptake.  Exam was somewhat limited due to the patient having discomfort during the procedure and inability to remain laying down.  Patient recently had an exacerbation treated with a Medrol Dosepak and cycling.  He completed both of these.  Still continues to have productive cough with sputum is white to pale yellow.  No hemoptysis.  Continues to have issues with right shoulder pain.  Shortness of breath is at baseline.  He presented today with sats at 85% but after drinking a few deep breaths and settling down he was 92%.  He is compliant with nocturnal O2 at 3 L/min.  He notes that Mobic is not helping his right shoulder/back pain as much as prior.  He does take gabapentin at bedtime for neuropathy.  Does not endorse any other symptomatology.  TEST/EVENTS :  CT chest on 07/02/2015 RUL cavitary lesions measures 5.0 x 2.2 x 3.6 cm.  Previously 5.4 x 2.5 x 3.5 cm. CT chest 01/05/16 shows RUL cavitary mass 4.8x4.1 CM, previous measurement 4.6 x 3.8CM CT chest 08/2016 no significant change in size of RUL mass CT chest 03/2020 Significant interval-increase in a dense, masslike irregular consolidation primarily involving the right upper lobe, with architectural distortion and volume loss, the dominant component now measuring approximately 7.4 x 7.1 cm, previously 5.4 x 4.1 cm when measured most similarly,there are new and enlarged satellite nodules in the right upper lobe new and enlarged nodules in the right middle lobe, the largest in the lateral segment measuring 2.4 x 1.8 cm, previously 0.8 x 0.5 cm.There  is a new, nonspecific ground-glass opacity of the anterior left upper lobe, which is more ill-defined, measuring 2.0 x 1.1 cm  PET scan January 2022 right greater than left pulmonary hypermetabolic areas of airspace and groundglass consolidation, low-level hypermetabolism within the mediastinal nodes.  Hypermetabolism in the small abdominal nodes favored to be reactive CT chest June 04, 2020 similar appearance of persistent multifocal bilateral areas of patchy groundglass opacities. CT chest 06/17/2021: Multiple bilateral patchy nodular appearing areas of airspace disease largest area of involvement measures 10 cm x 5.6 cm and is seen in the posterior lateral aspect of the right upper lobe increased when compared to prior (6.2 cm x 4.0 cm) corresponds to area of abnormality seen on prior PET/CT.  Overall all areas are worsening. CT chest 07/15/2021:Nodular and masslike areas of ground-glass and mixed consolidation/ground-glass in the lungs bilaterally, new or progressive from 06/04/2020. 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  There is associated obstruction of the posterior segmental right upper lobe bronchus not previously noted.   BRONCHOSCOPIES: 03/09/2016 ENB assisted bronchoscopy, Dr. Mortimer Fries: Biopsy of right upper lobe mass, negative, no granulomas or neoplasm noted. 06/08/2020 ENB assisted bronchoscopy, Dr. Patsey Berthold: Biopsied right middle lobe lung mass/nodule, nondiagnostic. 08/02/2021  bronchoscopy with fluoroscopy, Dr. Patsey Berthold: Biopsy right upper lobe, nondiagnostic.  Inflammatory changes noted on ROSE, not confirmed on final path.    Review of Systems A 10 point review of systems was performed and it is as noted above otherwise negative.  Patient Active Problem List   Diagnosis Date Noted   Type 2 diabetes mellitus with diabetic polyneuropathy, with long-term current use of insulin ([place])    Nephrolithiasis    Ureterolithiasis    Proteus mirabilis infection     Pneumonia of right upper lobe due to Klebsiella pneumoniae ([place])    Acute renal failure with tubular necrosis ([place])    Acute respiratory failure with hypoxia ([facility])    Septic shock ([place]) 07/05/2019   Mass of upper lobe of right lung 09/29/2015   Cancer of tonsil ([place]) 08/05/2014   HTN (hypertension) 08/05/2014   COPD (chronic obstructive pulmonary disease) ([place]) 08/05/2014   Hyperlipidemia 08/05/2014   Social History   Tobacco Use   Smoking status: Every Day    Packs/day: 1.50    Years: 40.00    Total pack years: 60.00    Types: Cigarettes   Smokeless tobacco: Never   Tobacco comments:    1ppd - 12/23/2021  not trying to quit.  12/23/2021 hfb  Substance Use Topics   Alcohol use: No   No Known Allergies  Current Meds  Medication Sig   acetaminophen (TYLENOL) 500 MG tablet Take 500 mg by mouth every 4 (four) hours as needed for moderate pain.   albuterol (PROVENTIL) (2.5 MG/3ML) 0.083% nebulizer solution Take 3 mLs (2.5 mg total) by nebulization every 6 (six) hours as needed for wheezing or shortness of breath.   amLODipine (NORVASC) 5 MG tablet Take 5 mg by mouth daily.   Ascorbic Acid (VITAMIN C PO) Take 1 tablet by mouth daily in the afternoon.   aspirin EC 81 MG EC tablet Take 1 tablet (81 mg total) by mouth daily.   atorvastatin (LIPITOR) 80 MG tablet Take 80 mg by mouth daily at 6 PM.    azithromycin (ZITHROMAX) 250 MG tablet TAKE 1 TABLET BY MOUTH 3 TIMES A WEEK TAKE ON MONDAY  WEDNESDAY  AND FRIDAY   BREZTRI AEROSPHERE 160-9-4.8 MCG/ACT AERO INHALE 2 PUFFS INTO THE LUNGS ONCE EVERYMORNING AND AT BEDTIME   Cholecalciferol 2000 UNITS CAPS Take 2,000 Units by mouth daily.   doxycycline (VIBRA-TABS) 100 MG tablet Take 1 tablet (100 mg total) by mouth 2 (two) times daily.   FLUoxetine (PROZAC) 10 MG capsule Take 10 mg by mouth daily.   gabapentin (NEURONTIN) 600 MG tablet Take by mouth. Take 1 tablet (600 mg total) by mouth 2 (two) times daily   HYDROcodone-acetaminophen  (NORCO/VICODIN) 5-325 MG tablet Take by mouth. Take 1 tablet by mouth every 12 (twelve) hours as needed for Pain   levETIRAcetam (KEPPRA) 500 MG tablet Take 500 mg by mouth 2 (two) times daily.   levothyroxine (SYNTHROID) 112 MCG tablet Take 112 mcg by mouth daily.   magnesium oxide (MAG-OX) 400 MG tablet Take 400 mg by mouth 2 (two) times daily.   meloxicam (MOBIC) 15 MG tablet Take 1 tablet (15 mg total) by mouth daily. Take with largest meal of the day.   methylPREDNISolone (MEDROL DOSEPAK) 4 MG TBPK tablet Use as directed   Multiple Vitamin (MULTIVITAMIN) tablet Take 1 tablet by mouth daily.   Omega-3 Fatty Acids (FISH OIL) 1000 MG CAPS Take 1,000 mg by mouth daily.   omeprazole (PRILOSEC) 20 MG capsule Take 20  mg by mouth daily.   VENTOLIN HFA 108 (90 Base) MCG/ACT inhaler Inhale 1-2 puffs into the lungs every 6 (six) hours as needed (for shortness of breath/wheezing.).   [DISCONTINUED] Nebulizers (COMPRESSOR/NEBULIZER) MISC Use as directed   Immunization History  Administered Date(s) Administered   Fluad Quad(high Dose 65+) 01/05/2021   Influenza Split 01/20/2014, 05/11/2015   Influenza, Seasonal, Injecte, Preservative Fre 02/08/2010, 04/05/2011, 02/03/2012, 01/31/2013   Influenza,inj,Quad PF,6+ Mos 01/20/2014, 04/29/2015, 01/28/2016, 05/15/2019, 02/27/2020   Influenza-Unspecified 01/30/2014, 04/29/2015, 01/28/2016, 05/15/2019, 02/27/2020   Pneumococcal Conjugate-13 04/29/2015   Pneumococcal Polysaccharide-23 02/08/2010, 05/11/2015   Tdap 08/28/2017   Zoster, Live 08/03/2012      Objective:   Physical Exam BP 130/80 (BP Location: Left Arm, Cuff Size: Normal)   Pulse 73   SpO2 (!) 85% after patient settled in room oxygen saturation was 92%. GENERAL: Obese gentleman, no acute distress.  Chronically ill-appearing, presents in transport chair.  No respiratory distress.  No conversational dyspnea.   HEAD: Normocephalic, atraumatic.  EYES: Pupils equal, round, reactive to light.  No  scleral icterus.  MOUTH: Mucosa moist.  No thrush.  Pharynx clear. NECK: Supple. No thyromegaly. Trachea midline. No JVD.  No adenopathy. PULMONARY: Good air entry bilaterally.  Diminished breath sounds on the R upper lung zone, some rhonchi noted right midlung field.  CARDIOVASCULAR: S1 and S2. Regular rate and rhythm.  No rubs, murmurs or gallops heard. ABDOMEN: Obese otherwise benign. MUSCULOSKELETAL: No joint deformity, no clubbing, no edema.  NEUROLOGIC: No focal deficits. SKIN: Intact,warm,dry. PSYCH: Mood and behavior normal.  I have reviewed the images of the PET/CT performed 12 January 2022, these images were discussed with the patient at the time of the visit and shown to the patient as well.  He was offered the opportunity to ask questions and these were answered to his satisfaction.     Assessment & Plan:     ICD-10-CM   1. Mass of upper lobe of right lung  R91.8 Ambulatory referral to Interventional Radiology    CT LUNG MASS BIOPSY    CANCELED: CT BIOPSY   We will send referral for IR CT-guided biopsy Continue Breztri 2 puffs twice a day Continue as needed Ventolin STOP SMOKING!    2. Stage 2 moderate COPD by GOLD classification ([place])  J44.9     3. Pleuritic chest pain  R07.81    Continue Mobic May increase gabapentin to 600 mg twice daily    4. Morbid obesity with BMI of 40.0-44.9, adult ([place])  E66.01    Z68.41    This issue adds complexity to his management    5. Tobacco dependence due to cigarettes  F17.210    Patient counseled regards to discontinuation of smoking Normal counseling time 3 to 5 minutes     Orders Placed This Encounter  Procedures   CT LUNG MASS BIOPSY    Standing Status:   Future    Standing Expiration Date:   07/29/2022    Order Specific Question:   Lab orders requested (DO NOT place separate lab orders, these will be automatically ordered during procedure specimen collection):    Answer:   Surgical Pathology    Order Specific  Question:   Reason for Exam (SYMPTOM  OR DIAGNOSIS REQUIRED)    Answer:   RUL lung mass    Order Specific Question:   Preferred location?    Answer:   [place]   Ambulatory referral to Interventional Radiology    Referral Priority:   Routine  Referral Type:   Consultation    Referral Reason:   Specialty Services Required    Requested Specialty:   Interventional Radiology    Number of Visits Requested:   1   Meds ordered this encounter  Medications   albuterol (PROVENTIL) (2.5 MG/3ML) 0.083% nebulizer solution    Sig: Take 3 mLs (2.5 mg total) by nebulization every 6 (six) hours as needed for wheezing or shortness of breath.    Dispense:  125 mL    Refill:  12   Nebulizers (COMPRESSOR/NEBULIZER) MISC    Sig: Use as directed    Dispense:  1 each    Refill:  0   gabapentin (NEURONTIN) 600 MG tablet    Sig: Take 1 tablet (600 mg total) by mouth 2 (two) times daily.    Dispense:  60 tablet    Refill:  11   We will see the patient in follow-up in 4 to 6 weeks time he is to contact us prior to that time should any new difficulties arise.  Renold Don, MD Advanced Bronchoscopy PCCM [facility]    *This note was dictated using voice recognition software/Dragon.  Despite best efforts to proofread, errors can occur which can change the meaning. Any transcriptional errors that result from this process are unintentional and may not be fully corrected at the time of dictation.

## 2022-01-25 NOTE — Patient Instructions (Signed)
We printed a prescription so you can take it to [facility] and get your nebulizer.  The medication for them the nebulizer (albuterol) was sent into [facility] already.   We have sent in a referral to Interventional Radiology (IR) to see if they can sample the spot in your lung that we have not been able to get a diagnosis on.  We will see you in follow-up in 4 to 6 weeks time call sooner should any new problems arise.  Continue taking your doxycycline until it is all completed.  Let us know if you are not better after taking it.

## 2022-01-28 IMAGING — CT CT HEAD W/O CM
3 of 4 series · 15 of 47 positions shown, 18 images · non-contrast
Comparison: CT head 07/05/2019

CLINICAL DATA: Per RN pt with increased confusion and speaking in
nonsense, started about 1 hour again. Pt with recent extubation and
recent surgery. Hx of stroke.

EXAM:
CT HEAD WITHOUT CONTRAST
TECHNIQUE: Contiguous axial images were obtained from the base of the skull
through the vertex without intravenous contrast.

[Series 3: coronal soft tissue · coronal · 0.32mm/px · 3 of 73 slices shown]
[im 28/73  brain]
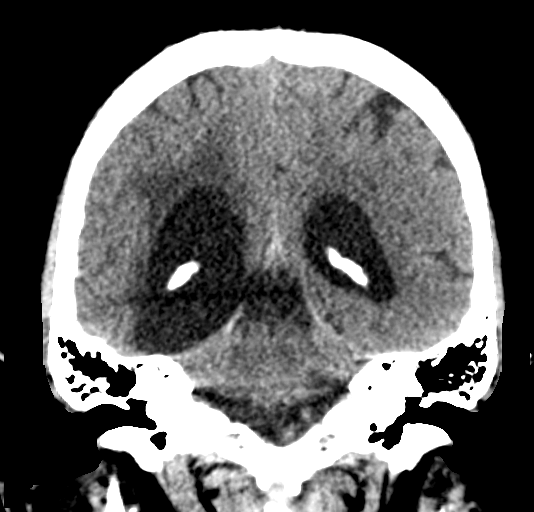
[im 34/73  brain]
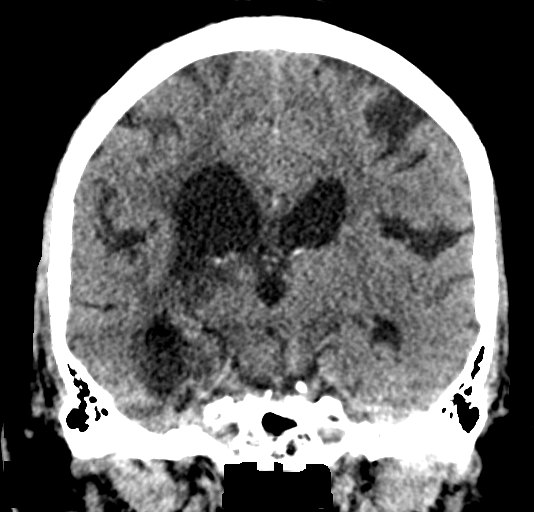
[im 39/73  brain]
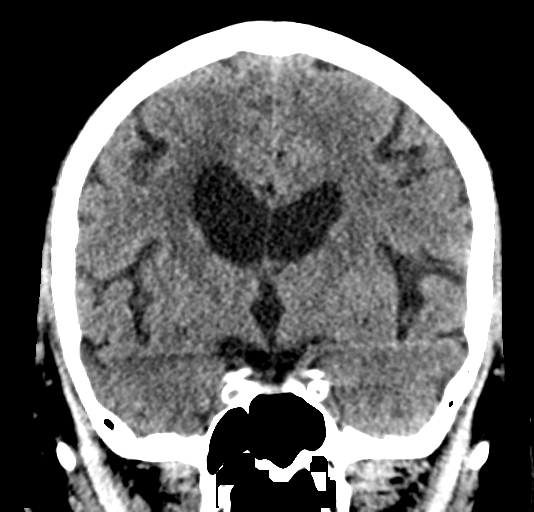

[Series 4: head wo · axial · 0.42mm/px · z∈[-180,-40]mm · 9 of 34 slices shown, 12 images]
[im 3/34  brain]
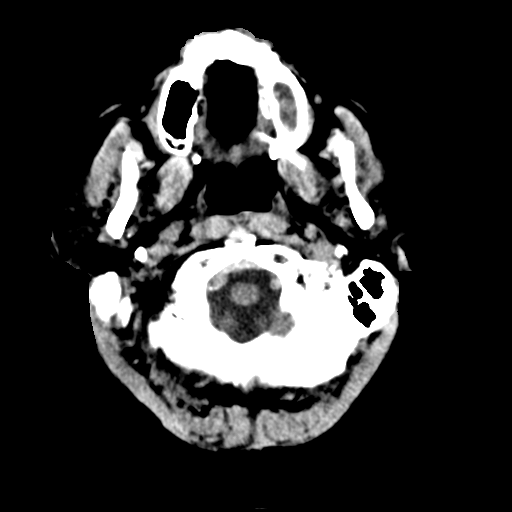
[im 3/34  bone]
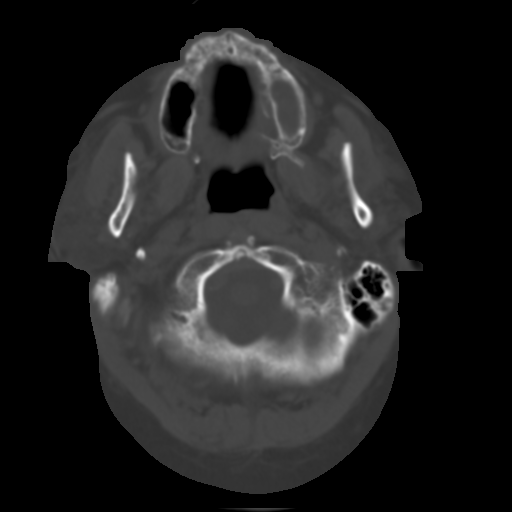
[im 8/34  brain]
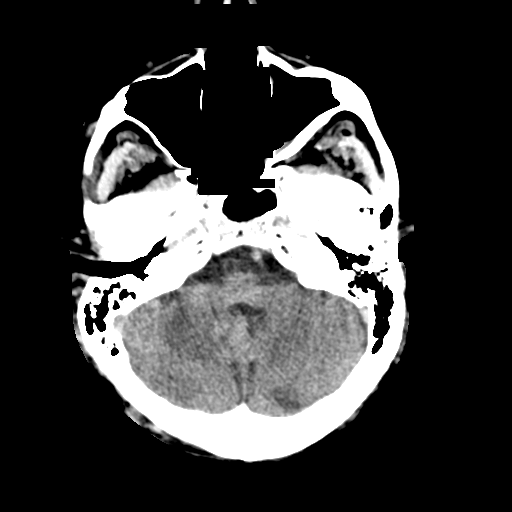
[im 10/34  brain]
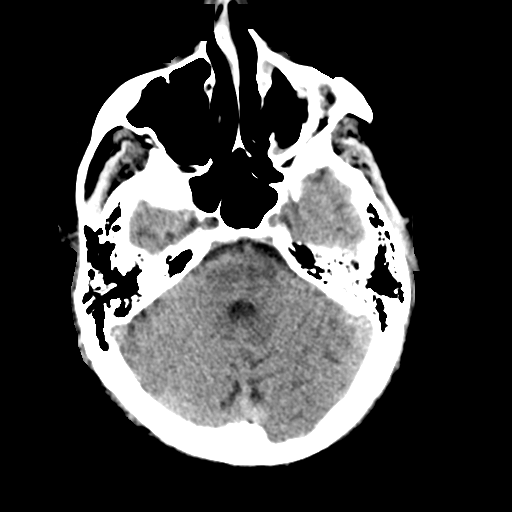
[im 15/34  brain]
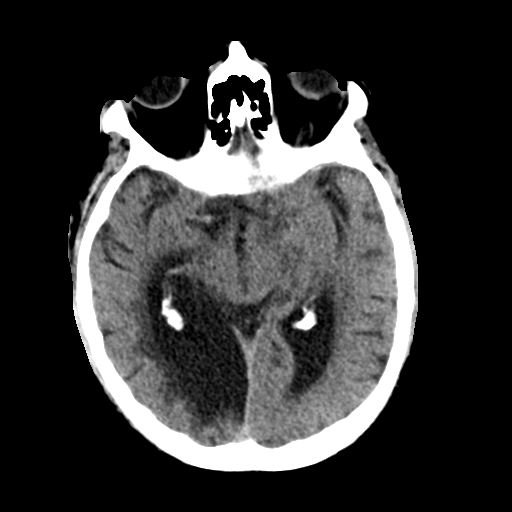
[im 17/34  brain]
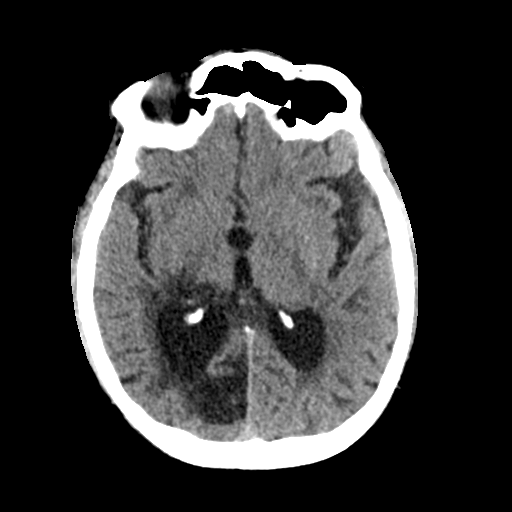
[im 17/34  bone]
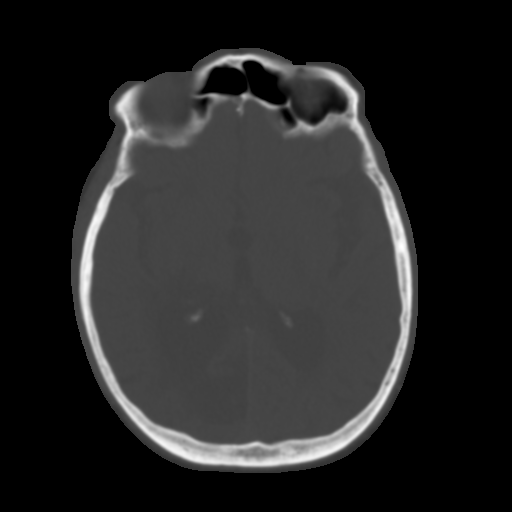
[im 19/34  brain]
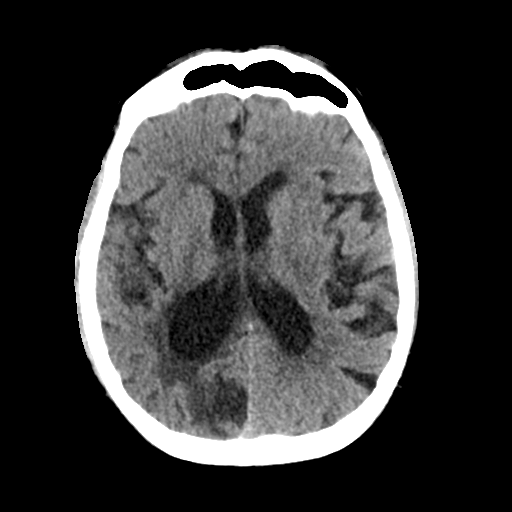
[im 24/34  brain]
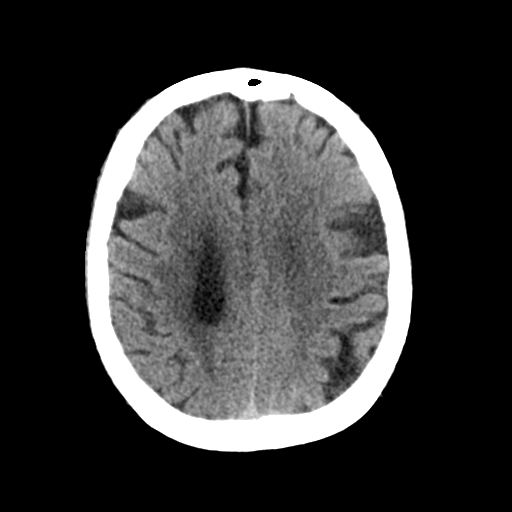
[im 26/34  brain]
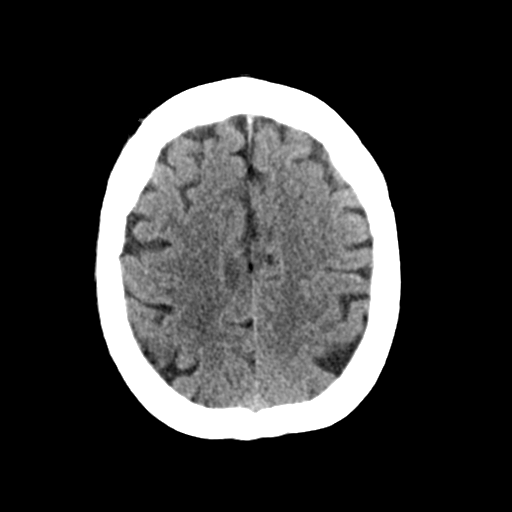
[im 31/34  brain]
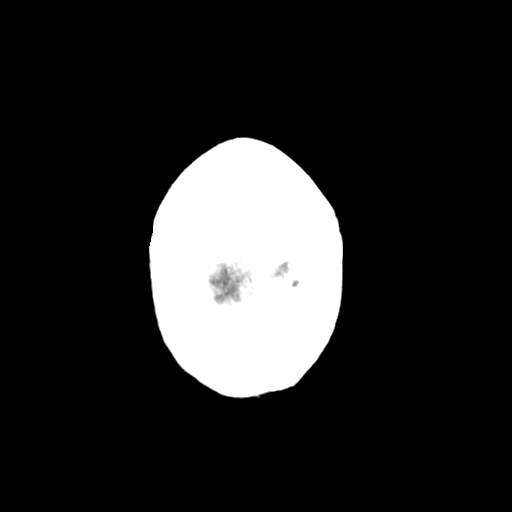
[im 31/34  bone]
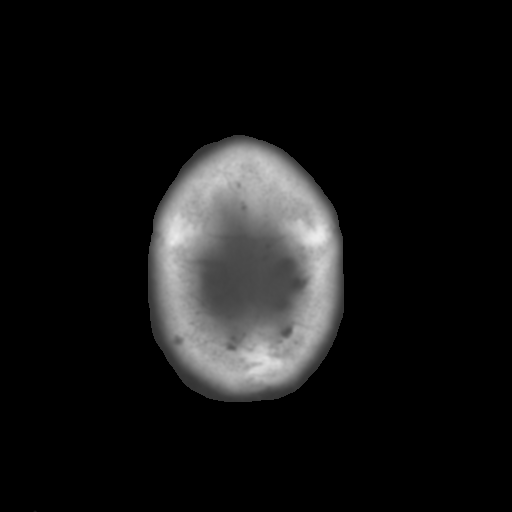

[Series 5: sagittal soft tissue · sagittal · 0.32mm/px · 3 of 55 slices shown]
[im 19/55  brain]
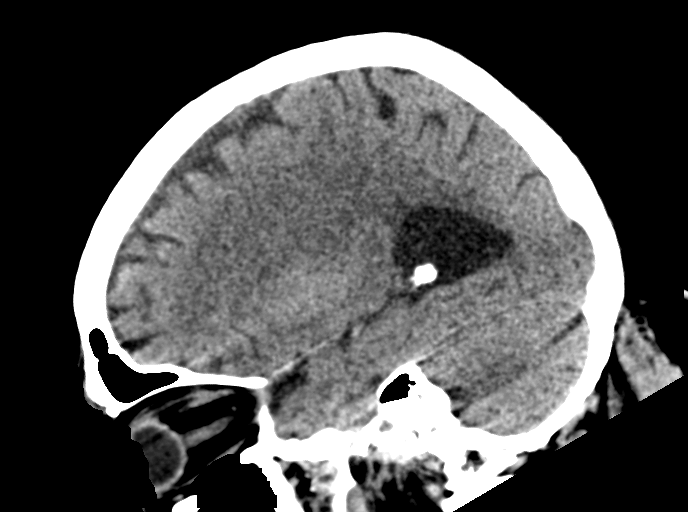
[im 28/55  brain]
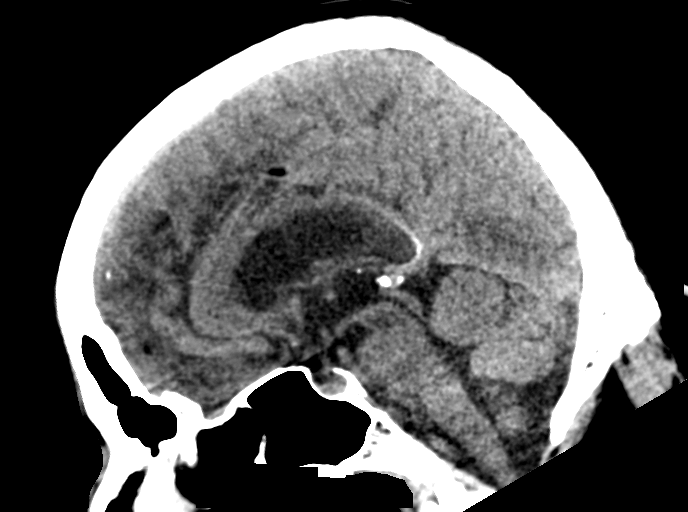
[im 37/55  brain]
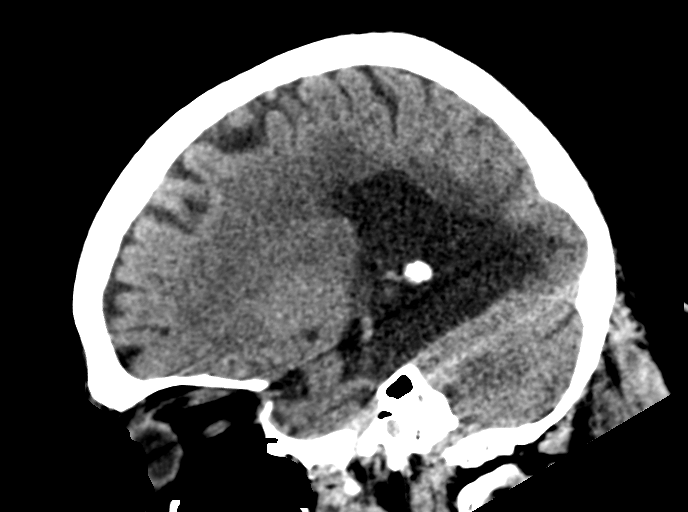

[15 of 47 positions shown; findings below may reference images not displayed]

FINDINGS: Brain: No evidence of acute infarction, hemorrhage, hydrocephalus,
extra-axial collection or mass lesion/mass effect. There is an old
large right occipital infarct and smaller left parietal infarct.
Diffuse periventricular white matter hypoattenuation consistent with
chronic small vessel ischemic change. Diffuse atrophy.

Vascular: No hyperdense vessel or unexpected calcification.

Skull: Normal. Negative for fracture or focal lesion.

Sinuses/Orbits: Left maxillary sinus mucosal thickening the
remaining paranasal sinuses are clear. Orbits are unremarkable.

Other: None.
IMPRESSION: 1. No acute intracranial abnormality.
2. Chronic findings as above.

## 2022-01-29 DIAGNOSIS — J449 Chronic obstructive pulmonary disease, unspecified: Secondary | ICD-10-CM | POA: Diagnosis not present

## 2022-01-31 ENCOUNTER — Telehealth: Payer: Self-pay | Admitting: Pulmonary Disease

## 2022-01-31 NOTE — Telephone Encounter (Signed)
Called and spoke to patient.  He stated he completed doxycyline and medrol dosepak two day again with mild improvement in sx. He postponed CT bx to 02/14/2022 due to sx.  C/o SOB with exertion, wheezing and prod cough with clear to brown sputum. Back/right side pain is unchanged.  Denied f/c/s or additional sx.   Wears 2L QHS.   Dr. Patsey Berthold, please advise

## 2022-01-31 NOTE — Telephone Encounter (Signed)
The biopsy is what is going to give [place] better idea of what is going on.  At present I do not think we need to prescribe anything else and await biopsy reports.

## 2022-01-31 NOTE — Telephone Encounter (Signed)
Patient is aware of recommendations and voiced his understanding.  Nothing further needed.

## 2022-01-31 NOTE — Telephone Encounter (Signed)
Patient states he has a procedure next Thursday but his breathing is still bad. Patient finished mediations that were given, but states he thinks he may need more to get his breathing under control. Patein uses [facility].  Please advise.

## 2022-02-02 ENCOUNTER — Other Ambulatory Visit: Payer: Self-pay | Admitting: Pulmonary Disease

## 2022-02-04 ENCOUNTER — Ambulatory Visit: Admission: RE | Admit: 2022-02-04 | Payer: PPO | Source: Ambulatory Visit

## 2022-02-04 ENCOUNTER — Encounter: Payer: Self-pay | Admitting: Pulmonary Disease

## 2022-02-05 ENCOUNTER — Other Ambulatory Visit: Payer: Self-pay

## 2022-02-05 ENCOUNTER — Emergency Department: Payer: PPO

## 2022-02-05 ENCOUNTER — Inpatient Hospital Stay
Admission: EM | Admit: 2022-02-05 | Discharge: 2022-02-14 | DRG: 189 | Disposition: A | Discharge status: Skilled Nursing Facility | Payer: PPO | Attending: Obstetrics and Gynecology | Admitting: Obstetrics and Gynecology

## 2022-02-05 DIAGNOSIS — R0689 Other abnormalities of breathing: Secondary | ICD-10-CM | POA: Diagnosis not present

## 2022-02-05 DIAGNOSIS — J9622 Acute and chronic respiratory failure with hypercapnia: Secondary | ICD-10-CM | POA: Diagnosis present

## 2022-02-05 DIAGNOSIS — Z8701 Personal history of pneumonia (recurrent): Secondary | ICD-10-CM

## 2022-02-05 DIAGNOSIS — J439 Emphysema, unspecified: Secondary | ICD-10-CM | POA: Diagnosis not present

## 2022-02-05 DIAGNOSIS — Z794 Long term (current) use of insulin: Secondary | ICD-10-CM

## 2022-02-05 DIAGNOSIS — E7800 Pure hypercholesterolemia, unspecified: Secondary | ICD-10-CM | POA: Diagnosis not present

## 2022-02-05 DIAGNOSIS — Z8673 Personal history of transient ischemic attack (TIA), and cerebral infarction without residual deficits: Secondary | ICD-10-CM

## 2022-02-05 DIAGNOSIS — E66813 Obesity, class 3: Secondary | ICD-10-CM | POA: Diagnosis present

## 2022-02-05 DIAGNOSIS — F32A Depression, unspecified: Secondary | ICD-10-CM | POA: Diagnosis not present

## 2022-02-05 DIAGNOSIS — I10 Essential (primary) hypertension: Secondary | ICD-10-CM | POA: Diagnosis not present

## 2022-02-05 DIAGNOSIS — J9620 Acute and chronic respiratory failure, unspecified whether with hypoxia or hypercapnia: Secondary | ICD-10-CM | POA: Diagnosis not present

## 2022-02-05 DIAGNOSIS — Z1152 Encounter for screening for COVID-19: Secondary | ICD-10-CM | POA: Diagnosis not present

## 2022-02-05 DIAGNOSIS — J9601 Acute respiratory failure with hypoxia: Principal | ICD-10-CM

## 2022-02-05 DIAGNOSIS — J910 Malignant pleural effusion: Secondary | ICD-10-CM | POA: Diagnosis not present

## 2022-02-05 DIAGNOSIS — J449 Chronic obstructive pulmonary disease, unspecified: Secondary | ICD-10-CM | POA: Diagnosis not present

## 2022-02-05 DIAGNOSIS — J90 Pleural effusion, not elsewhere classified: Secondary | ICD-10-CM | POA: Diagnosis present

## 2022-02-05 DIAGNOSIS — R079 Chest pain, unspecified: Secondary | ICD-10-CM | POA: Diagnosis not present

## 2022-02-05 DIAGNOSIS — C3490 Malignant neoplasm of unspecified part of unspecified bronchus or lung: Secondary | ICD-10-CM | POA: Diagnosis present

## 2022-02-05 DIAGNOSIS — R2681 Unsteadiness on feet: Secondary | ICD-10-CM | POA: Diagnosis not present

## 2022-02-05 DIAGNOSIS — R5381 Other malaise: Secondary | ICD-10-CM | POA: Diagnosis not present

## 2022-02-05 DIAGNOSIS — J9621 Acute and chronic respiratory failure with hypoxia: Secondary | ICD-10-CM | POA: Diagnosis not present

## 2022-02-05 DIAGNOSIS — Z736 Limitation of activities due to disability: Secondary | ICD-10-CM | POA: Diagnosis not present

## 2022-02-05 DIAGNOSIS — Z85118 Personal history of other malignant neoplasm of bronchus and lung: Secondary | ICD-10-CM

## 2022-02-05 DIAGNOSIS — I878 Other specified disorders of veins: Secondary | ICD-10-CM | POA: Diagnosis not present

## 2022-02-05 DIAGNOSIS — I6381 Other cerebral infarction due to occlusion or stenosis of small artery: Secondary | ICD-10-CM | POA: Diagnosis not present

## 2022-02-05 DIAGNOSIS — K219 Gastro-esophageal reflux disease without esophagitis: Secondary | ICD-10-CM | POA: Diagnosis present

## 2022-02-05 DIAGNOSIS — R918 Other nonspecific abnormal finding of lung field: Secondary | ICD-10-CM | POA: Diagnosis present

## 2022-02-05 DIAGNOSIS — R410 Disorientation, unspecified: Secondary | ICD-10-CM | POA: Diagnosis not present

## 2022-02-05 DIAGNOSIS — J189 Pneumonia, unspecified organism: Secondary | ICD-10-CM | POA: Diagnosis present

## 2022-02-05 DIAGNOSIS — R29898 Other symptoms and signs involving the musculoskeletal system: Secondary | ICD-10-CM | POA: Diagnosis not present

## 2022-02-05 DIAGNOSIS — M549 Dorsalgia, unspecified: Secondary | ICD-10-CM | POA: Diagnosis present

## 2022-02-05 DIAGNOSIS — Z515 Encounter for palliative care: Secondary | ICD-10-CM

## 2022-02-05 DIAGNOSIS — Z6841 Body Mass Index (BMI) 40.0 and over, adult: Secondary | ICD-10-CM | POA: Diagnosis not present

## 2022-02-05 DIAGNOSIS — F17210 Nicotine dependence, cigarettes, uncomplicated: Secondary | ICD-10-CM | POA: Diagnosis not present

## 2022-02-05 DIAGNOSIS — Z7982 Long term (current) use of aspirin: Secondary | ICD-10-CM

## 2022-02-05 DIAGNOSIS — Z85818 Personal history of malignant neoplasm of other sites of lip, oral cavity, and pharynx: Secondary | ICD-10-CM

## 2022-02-05 DIAGNOSIS — E039 Hypothyroidism, unspecified: Secondary | ICD-10-CM | POA: Diagnosis present

## 2022-02-05 DIAGNOSIS — G40909 Epilepsy, unspecified, not intractable, without status epilepticus: Secondary | ICD-10-CM | POA: Diagnosis not present

## 2022-02-05 DIAGNOSIS — Z79890 Hormone replacement therapy (postmenopausal): Secondary | ICD-10-CM

## 2022-02-05 DIAGNOSIS — G4730 Sleep apnea, unspecified: Secondary | ICD-10-CM | POA: Diagnosis present

## 2022-02-05 DIAGNOSIS — F418 Other specified anxiety disorders: Secondary | ICD-10-CM | POA: Clinically undetermined

## 2022-02-05 DIAGNOSIS — E1142 Type 2 diabetes mellitus with diabetic polyneuropathy: Secondary | ICD-10-CM | POA: Diagnosis not present

## 2022-02-05 DIAGNOSIS — M6259 Muscle wasting and atrophy, not elsewhere classified, multiple sites: Secondary | ICD-10-CM | POA: Diagnosis not present

## 2022-02-05 DIAGNOSIS — F419 Anxiety disorder, unspecified: Secondary | ICD-10-CM | POA: Diagnosis present

## 2022-02-05 DIAGNOSIS — I2510 Atherosclerotic heart disease of native coronary artery without angina pectoris: Secondary | ICD-10-CM | POA: Diagnosis not present

## 2022-02-05 DIAGNOSIS — Z7189 Other specified counseling: Secondary | ICD-10-CM | POA: Diagnosis not present

## 2022-02-05 DIAGNOSIS — R0902 Hypoxemia: Secondary | ICD-10-CM | POA: Diagnosis not present

## 2022-02-05 DIAGNOSIS — Z79899 Other long term (current) drug therapy: Secondary | ICD-10-CM

## 2022-02-05 DIAGNOSIS — I5031 Acute diastolic (congestive) heart failure: Secondary | ICD-10-CM | POA: Diagnosis not present

## 2022-02-05 DIAGNOSIS — R0602 Shortness of breath: Secondary | ICD-10-CM | POA: Diagnosis not present

## 2022-02-05 DIAGNOSIS — Z87442 Personal history of urinary calculi: Secondary | ICD-10-CM

## 2022-02-05 DIAGNOSIS — Z791 Long term (current) use of non-steroidal anti-inflammatories (NSAID): Secondary | ICD-10-CM

## 2022-02-05 DIAGNOSIS — Z7401 Bed confinement status: Secondary | ICD-10-CM | POA: Diagnosis not present

## 2022-02-05 DIAGNOSIS — E785 Hyperlipidemia, unspecified: Secondary | ICD-10-CM | POA: Diagnosis present

## 2022-02-05 DIAGNOSIS — R000 Tachycardia, unspecified: Secondary | ICD-10-CM | POA: Diagnosis not present

## 2022-02-05 DIAGNOSIS — Z833 Family history of diabetes mellitus: Secondary | ICD-10-CM

## 2022-02-05 DIAGNOSIS — J150 Pneumonia due to Klebsiella pneumoniae: Secondary | ICD-10-CM | POA: Diagnosis not present

## 2022-02-05 LAB — COMPREHENSIVE METABOLIC PANEL
ALT: 18 U/L (ref 0–44)
AST: 24 U/L (ref 15–41)
Albumin: 3.6 g/dL (ref 3.5–5.0)
Alkaline Phosphatase: 85 U/L (ref 38–126)
Anion gap: 5 (ref 5–15)
BUN: 14 mg/dL (ref 8–23)
CO2: 32 mmol/L (ref 22–32)
Calcium: 8.9 mg/dL (ref 8.9–10.3)
Chloride: 105 mmol/L (ref 98–111)
Creatinine, Ser: 1.43 mg/dL — ABNORMAL HIGH (ref 0.61–1.24)
GFR, Estimated: 52 mL/min — ABNORMAL LOW (ref >60)
Glucose, Bld: 86 mg/dL (ref 70–99)
Potassium: 4.6 mmol/L (ref 3.5–5.1)
Sodium: 142 mmol/L (ref 135–145)
Total Bilirubin: 0.7 mg/dL (ref 0.3–1.2)
Total Protein: 6.6 g/dL (ref 6.5–8.1)

## 2022-02-05 LAB — CBC WITH DIFFERENTIAL/PLATELET
Abs Immature Granulocytes: 0.05 10*3/uL (ref 0.00–0.07)
Basophils Absolute: 0.1 10*3/uL (ref 0.0–0.1)
Basophils Relative: 0 %
Eosinophils Absolute: 0.5 10*3/uL (ref 0.0–0.5)
Eosinophils Relative: 4 %
HCT: 42.6 % (ref 39.0–52.0)
Hemoglobin: 13.1 g/dL (ref 13.0–17.0)
Immature Granulocytes: 0 %
Lymphocytes Relative: 8 %
Lymphs Abs: 1.1 10*3/uL (ref 0.7–4.0)
MCH: 28.9 pg (ref 26.0–34.0)
MCHC: 30.8 g/dL (ref 30.0–36.0)
MCV: 94 fL (ref 80.0–100.0)
Monocytes Absolute: 0.9 10*3/uL (ref 0.1–1.0)
Monocytes Relative: 7 %
Neutro Abs: 10.5 10*3/uL — ABNORMAL HIGH (ref 1.7–7.7)
Neutrophils Relative %: 81 %
Platelets: 210 10*3/uL (ref 150–400)
RBC: 4.53 MIL/uL (ref 4.22–5.81)
RDW: 16.2 % — ABNORMAL HIGH (ref 11.5–15.5)
WBC: 13 10*3/uL — ABNORMAL HIGH (ref 4.0–10.5)
nRBC: 0 % (ref 0.0–0.2)

## 2022-02-05 LAB — TROPONIN I (HIGH SENSITIVITY)
Troponin I (High Sensitivity): 19 ng/L — ABNORMAL HIGH (ref <18)
Troponin I (High Sensitivity): 17 ng/L (ref <18)

## 2022-02-05 LAB — MRSA NEXT GEN BY PCR, NASAL: MRSA by PCR Next Gen: NOT DETECTED

## 2022-02-05 LAB — LACTIC ACID, PLASMA
Lactic Acid, Venous: 1.1 mmol/L (ref 0.5–1.9)
Lactic Acid, Venous: 1.2 mmol/L (ref 0.5–1.9)

## 2022-02-05 LAB — SARS CORONAVIRUS 2 BY RT PCR: SARS Coronavirus 2 by RT PCR: NEGATIVE

## 2022-02-05 LAB — BRAIN NATRIURETIC PEPTIDE: B Natriuretic Peptide: 262.3 pg/mL — ABNORMAL HIGH (ref 0.0–100.0)

## 2022-02-05 LAB — PROCALCITONIN: Procalcitonin: <0.1 ng/mL

## 2022-02-05 LAB — CBG MONITORING, ED: Glucose-Capillary: 184 mg/dL — ABNORMAL HIGH (ref 70–99)

## 2022-02-05 MED ORDER — ONDANSETRON HCL 4 MG/2ML IJ SOLN
4.0000 mg | Freq: Four times a day (QID) | INTRAMUSCULAR | Status: AC | PRN
  Administered 2022-02-06 (×2): 4 mg via INTRAVENOUS
  Filled 2022-02-05 (×3): qty 2

## 2022-02-05 MED ORDER — ONDANSETRON HCL 4 MG PO TABS: 4.0000 mg | ORAL_TABLET | Freq: Four times a day (QID) | ORAL | Status: AC | PRN

## 2022-02-05 MED ORDER — SODIUM CHLORIDE 0.9 % IV SOLN: 1.0000 g | Freq: Once | INTRAVENOUS | Status: DC

## 2022-02-05 MED ORDER — METHYLPREDNISOLONE SODIUM SUCC 125 MG IJ SOLR
125.0000 mg | Freq: Once | INTRAMUSCULAR | Status: AC
  Administered 2022-02-05: 125 mg via INTRAVENOUS
  Filled 2022-02-05: qty 2

## 2022-02-05 MED ORDER — FENTANYL CITRATE PF 50 MCG/ML IJ SOSY
25.0000 ug | PREFILLED_SYRINGE | INTRAMUSCULAR | Status: DC | PRN
  Administered 2022-02-05 – 2022-02-09 (×2): 25 ug via INTRAVENOUS
  Filled 2022-02-05 (×2): qty 1

## 2022-02-05 MED ORDER — INSULIN ASPART 100 UNIT/ML IJ SOLN
0.0000 [IU] | Freq: Every day | INTRAMUSCULAR | Status: DC
  Administered 2022-02-10: 2 [IU] via SUBCUTANEOUS
  Administered 2022-02-11: 3 [IU] via SUBCUTANEOUS
  Administered 2022-02-12: 2 [IU] via SUBCUTANEOUS
  Filled 2022-02-05 (×3): qty 1

## 2022-02-05 MED ORDER — LEVOTHYROXINE SODIUM 112 MCG PO TABS
112.0000 ug | ORAL_TABLET | Freq: Every day | ORAL | Status: DC
  Administered 2022-02-06 – 2022-02-14 (×9): 112 ug via ORAL
  Filled 2022-02-05 (×9): qty 1

## 2022-02-05 MED ORDER — METHYLPREDNISOLONE SODIUM SUCC 40 MG IJ SOLR
40.0000 mg | Freq: Two times a day (BID) | INTRAMUSCULAR | Status: DC
  Administered 2022-02-06: 40 mg via INTRAVENOUS
  Filled 2022-02-05: qty 1

## 2022-02-05 MED ORDER — SODIUM CHLORIDE 0.9 % IV SOLN
2.0000 g | Freq: Every day | INTRAVENOUS | Status: AC
  Administered 2022-02-06 – 2022-02-09 (×4): 2 g via INTRAVENOUS
  Filled 2022-02-05: qty 20
  Filled 2022-02-05: qty 2
  Filled 2022-02-05 (×2): qty 20

## 2022-02-05 MED ORDER — ATORVASTATIN CALCIUM 80 MG PO TABS
80.0000 mg | ORAL_TABLET | Freq: Every day | ORAL | Status: DC
  Administered 2022-02-05 – 2022-02-13 (×9): 80 mg via ORAL
  Filled 2022-02-05 (×7): qty 1
  Filled 2022-02-05: qty 4
  Filled 2022-02-05: qty 1

## 2022-02-05 MED ORDER — ENOXAPARIN SODIUM 80 MG/0.8ML IJ SOSY
0.5000 mg/kg | PREFILLED_SYRINGE | INTRAMUSCULAR | Status: DC
  Administered 2022-02-05 – 2022-02-13 (×9): 67.5 mg via SUBCUTANEOUS
  Filled 2022-02-05 (×2): qty 0.8
  Filled 2022-02-05: qty 0.68
  Filled 2022-02-05: qty 0.8
  Filled 2022-02-05: qty 0.68
  Filled 2022-02-05 (×3): qty 0.8
  Filled 2022-02-05: qty 0.68
  Filled 2022-02-05 (×2): qty 0.8

## 2022-02-05 MED ORDER — STERILE WATER FOR INJECTION IJ SOLN
INTRAMUSCULAR | Status: AC
  Administered 2022-02-05: 2 mL
  Filled 2022-02-05: qty 10

## 2022-02-05 MED ORDER — MORPHINE SULFATE (PF) 2 MG/ML IV SOLN
2.0000 mg | INTRAVENOUS | Status: AC | PRN
  Administered 2022-02-06 (×3): 2 mg via INTRAVENOUS
  Filled 2022-02-05 (×3): qty 1

## 2022-02-05 MED ORDER — SODIUM CHLORIDE 0.9 % IV SOLN
500.0000 mg | INTRAVENOUS | Status: DC
  Administered 2022-02-05: 500 mg via INTRAVENOUS

## 2022-02-05 MED ORDER — IPRATROPIUM-ALBUTEROL 0.5-2.5 (3) MG/3ML IN SOLN
3.0000 mL | Freq: Once | RESPIRATORY_TRACT | Status: AC
  Administered 2022-02-05: 3 mL via RESPIRATORY_TRACT
  Filled 2022-02-05: qty 3

## 2022-02-05 MED ORDER — SODIUM CHLORIDE 0.9 % IV SOLN
500.0000 mg | Freq: Once | INTRAVENOUS | Status: DC
  Filled 2022-02-05: qty 5

## 2022-02-05 MED ORDER — IPRATROPIUM-ALBUTEROL 0.5-2.5 (3) MG/3ML IN SOLN
3.0000 mL | Freq: Three times a day (TID) | RESPIRATORY_TRACT | Status: DC
  Administered 2022-02-05: 3 mL via RESPIRATORY_TRACT
  Filled 2022-02-05: qty 3

## 2022-02-05 MED ORDER — ACETAMINOPHEN 325 MG PO TABS
650.0000 mg | ORAL_TABLET | Freq: Four times a day (QID) | ORAL | Status: AC | PRN
  Administered 2022-02-06 – 2022-02-10 (×2): 650 mg via ORAL
  Filled 2022-02-05 (×2): qty 2

## 2022-02-05 MED ORDER — SODIUM CHLORIDE 0.9 % IV SOLN
1.0000 g | Freq: Once | INTRAVENOUS | Status: AC
  Administered 2022-02-05: 1 g via INTRAVENOUS
  Filled 2022-02-05: qty 10

## 2022-02-05 MED ORDER — HYDRALAZINE HCL 10 MG PO TABS: 10.0000 mg | ORAL_TABLET | Freq: Four times a day (QID) | ORAL | Status: AC | PRN

## 2022-02-05 MED ORDER — SENNOSIDES-DOCUSATE SODIUM 8.6-50 MG PO TABS
1.0000 | ORAL_TABLET | Freq: Every evening | ORAL | Status: DC | PRN
  Administered 2022-02-12: 1 via ORAL
  Filled 2022-02-05: qty 1

## 2022-02-05 MED ORDER — INSULIN ASPART 100 UNIT/ML IJ SOLN
0.0000 [IU] | Freq: Three times a day (TID) | INTRAMUSCULAR | Status: DC
  Administered 2022-02-06: 2 [IU] via SUBCUTANEOUS
  Administered 2022-02-06 – 2022-02-07 (×2): 1 [IU] via SUBCUTANEOUS
  Administered 2022-02-08: 2 [IU] via SUBCUTANEOUS
  Administered 2022-02-09 (×3): 1 [IU] via SUBCUTANEOUS
  Administered 2022-02-10 (×2): 2 [IU] via SUBCUTANEOUS
  Administered 2022-02-10: 1 [IU] via SUBCUTANEOUS
  Administered 2022-02-11 (×2): 3 [IU] via SUBCUTANEOUS
  Administered 2022-02-12: 7 [IU] via SUBCUTANEOUS
  Administered 2022-02-12: 2 [IU] via SUBCUTANEOUS
  Administered 2022-02-13 (×2): 3 [IU] via SUBCUTANEOUS
  Administered 2022-02-14: 2 [IU] via SUBCUTANEOUS
  Filled 2022-02-05 (×17): qty 1

## 2022-02-05 MED ORDER — ACETAMINOPHEN 650 MG RE SUPP: 650.0000 mg | Freq: Four times a day (QID) | RECTAL | Status: AC | PRN

## 2022-02-05 MED ORDER — LIDOCAINE 5 % EX PTCH
1.0000 | MEDICATED_PATCH | CUTANEOUS | Status: AC
  Administered 2022-02-05 – 2022-02-07 (×3): 1 via TRANSDERMAL
  Filled 2022-02-05 (×3): qty 1

## 2022-02-05 MED ORDER — IOHEXOL 350 MG/ML SOLN
100.0000 mL | Freq: Once | INTRAVENOUS | Status: AC | PRN
  Administered 2022-02-05: 100 mL via INTRAVENOUS

## 2022-02-05 NOTE — Hospital Course (Addendum)
[name] is a 71 year old male with history of pulmonary lesion concerning for adenocarcinoma, tobacco use, COPD, hypothyroid, depression, anxiety, neuropathy, hypertension, hyperlipidemia, who presents emergency department for chief concerns of shortness of breath.  Initial vitals in the emergency department showed temperature of 98, respiration rate of 30, heart rate of 37, blood pressure 130/62, SPO2 of 90% on 6 L nasal cannula.  At baseline patient requires 3 L nasal cannula nightly and with napping.  Serum sodium is 142, potassium 4.6, chloride 105, bicarb 32, BUN of 14, serum creatinine of 1.43, GFR 52, nonfasting blood glucose 86, WBC 13, hemoglobin 13.1, platelets of 210.  BNP was 262.3.  High sensitive troponin was 19.  Procalcitonin was less than 0.10.  CTA of the chest was read as negative for PE.  Patchy groundglass opacity in the left upper and left lower lobe and right upper and lower opacity consistent with multifocal pneumonia.  ED treatment: DuoNebs one-time dose, Solu-Medrol 125 mg IV, sterile water injection.

## 2022-02-05 NOTE — Assessment & Plan Note (Addendum)
Acute on chronic, at least in part related to pneumonia, pleural effusion, ?musculoskeletal in setting of cough --Resumed home Norco - Lidocaine patch ordered - Acetaminophen --IV analgesics PRN for breakthrough pain --Mobilize

## 2022-02-05 NOTE — H&P (Addendum)
History and Physical   [name]:[id number] DOB: 05-26-1950 DOA: 02/05/2022  PCP: Leonel Ramsay, MD  Outpatient Specialists: Dr. Patsey Berthold, pulmonology Patient coming from: Home  I have personally briefly reviewed patient's old medical records in [place].  Chief Concern: Shortness of breath  HPI: [name] is a 71 year old male with history of pulmonary lesion concerning for adenocarcinoma, tobacco use, COPD, hypothyroid, depression, anxiety, neuropathy, hypertension, hyperlipidemia, who presents emergency department for chief concerns of shortness of breath.  Initial vitals in the emergency department showed temperature of 98, respiration rate of 30, heart rate of 37, blood pressure 130/62, SPO2 of 90% on 6 L nasal cannula.  At baseline patient requires 3 L nasal cannula nightly and with napping.  Serum sodium is 142, potassium 4.6, chloride 105, bicarb 32, BUN of 14, serum creatinine of 1.43, GFR 52, nonfasting blood glucose 86, WBC 13, hemoglobin 13.1, platelets of 210.  BNP was 262.3.  High sensitive troponin was 19.  Procalcitonin was less than 0.10.  CTA of the chest was read as negative for PE.  Patchy groundglass opacity in the left upper and left lower lobe and right upper and lower opacity consistent with multifocal pneumonia.  ED treatment: DuoNebs one-time dose, Solu-Medrol 125 mg IV, sterile water injection. ------------------------- At bedside, he is able to tell me his name, age, current lcoation and year. He states that last 2-3 weeks, he has been having worsening shortness of breath, going from room to room. Per patient, his spO2 was 79 on 3 L [place] per home measurement by a family RN.  He denies fever. He endorses cough with exertion, that is productive of brown phelm.   He denies dysuria, hematuria, diarrhea. He endorses right back mid thoracic pain states that this started a few days ago.  He denies trauma to his person.  He reports the pain  is worse with palpation and is positional.  Social history: He lives at home with his girlfriend, Shawn Lindsey. He smokes 1 ppd. He denies etoh and recreational drugs. He is retired and formerly worked as a Sales promotion account executive.    ROS: Constitutional: no weight change, no fever ENT/Mouth: no sore throat, no rhinorrhea Eyes: no eye pain, no vision changes Cardiovascular: no chest pain, no dyspnea,  no edema, no palpitations Respiratory: no cough, no sputum, no wheezing Gastrointestinal: no nausea, no vomiting, no diarrhea, no constipation Genitourinary: no urinary incontinence, no dysuria, no hematuria Musculoskeletal: no arthralgias, no myalgias Skin: no skin lesions, no pruritus, Neuro: + weakness, no loss of consciousness, no syncope Psych: no anxiety, no depression, + decrease appetite Heme/Lymph: no bruising, no bleeding  ED Course: Discussed with emergency medicine provider, patient requiring hospitalization for chief concerns of acute on chronic hypoxia.  Assessment/Plan  Principal Problem:   Acute on chronic respiratory failure with hypoxia (HCC) Active Problems:   HTN (hypertension)   COPD (chronic obstructive pulmonary disease) (HCC)   Hyperlipidemia   Mass of upper lobe of right lung   Type 2 diabetes mellitus with diabetic polyneuropathy, with long-term current use of insulin (HCC)   History of Klebsiella pneumonia   Multifocal pneumonia   Back pain   Obesity, Class III, BMI 40-49.9 (morbid obesity) ([place])   Assessment and Plan:  * Acute on chronic respiratory failure with hypoxia ([place]) Patient meets criteria for sepsis with increased respiration rate, leukocytosis, source of multifocal pneumonia - Added ceftriaxone 1 g IV to total 2 g IV on admission and continued for additional  dose for 5-day course - Azithromycin 500 mg IV daily, 5 doses ordered - DuoNebs 3 times daily, 3 doses ordered; Solu-Medrol 40 mg IV twice daily, 2 doses ordered - Check MRSA PCR,  sputum Gram stain and culture, respiratory to induce sputum - Pulmonology has been consulted, Dr. Lanney Gins via secure chat and epic order and staff message - Continue oxygen supplementation to maintain SPO2 greater than 92% - Incentive spirometry, flutter valve - Admit to progressive cardiac, inpatient  Obesity, Class III, BMI 40-49.9 (morbid obesity) ([place]) - This complicates overall care and prognosis.   Back pain - Presumed secondary to pneumonia versus musculoskeletal in setting of cough - Lidocaine patch ordered - Acetaminophen; morphine 2 mg IV every 4 hours as needed for moderate pain, 3 doses ordered; fentanyl 25 mcg IV every 2 hours as needed for severe pain, 3 doses ordered  Multifocal pneumonia - Check MRSA PCR, respiratory to do sputum, sputum Gram stain and culture ordered - Continue ceftriaxone 2 g IV, azithromycin 500 mg IV  History of Klebsiella pneumonia - Induced sputum and sputum culture with Gram stain ordered  Type 2 diabetes mellitus with diabetic polyneuropathy, with long-term current use of insulin (HCC) - Insulin SSI with at bedtime coverage ordered - Goal inpatient blood glucose levels 140-180  Mass of upper lobe of right lung - Patient has outpatient scheduling for biopsy on 02/11/2019 2020  Hyperlipidemia - Atorvastatin 80 mg daily resumed  HTN (hypertension) - Hydralazine 10 mg p.o. every 6 hours as needed for SBP greater than 180, 4 days ordered  Chart reviewed.   DVT prophylaxis: Enoxaparin Code Status: Full code Diet: Heart healthy Family Communication: No Disposition Plan: Pending clinical course and pulmonology evaluation Consults called: Pulmonology Admission status: Progressive cardiac, inpatient  Past Medical History:  Diagnosis Date   Anxiety    mild   Cancer ([place]) 2015   tonsil   COPD (chronic obstructive pulmonary disease) ([place])    Diabetes mellitus without complication ([place])    controlled with diet   Elevated cholesterol     GERD (gastroesophageal reflux disease)    History of kidney stones    Hypertension    Hypothyroidism    Morbid obesity with BMI of 40.0-44.9, adult ([place])    Pneumonia    klebsiella pneumoniae; extended stay in hospital requiring tracheotomy and gastrotomy   Seizures ([place]) 2015   related to sepsis   Septic shock ([place])    Sleep apnea 2019   has a Cpap machime    Stroke ([place]) 2007   no peripheral vision on left side of both eyes & left arm gets numb under stress   Past Surgical History:  Procedure Laterality Date   CYSTOSCOPY W/ RETROGRADES Right 07/23/2019   Procedure: CYSTOSCOPY WITH RETROGRADE PYELOGRAM;  Surgeon: Abbie Sons, MD;  Location: [facility] ORS;  Service: Urology;  Laterality: Right;   CYSTOSCOPY WITH STENT PLACEMENT Right 07/05/2019   Procedure: CYSTOSCOPY WITH STENT PLACEMENT;  Surgeon: Abbie Sons, MD;  Location: [facility] ORS;  Service: Urology;  Laterality: Right;   CYSTOSCOPY/URETEROSCOPY/HOLMIUM LASER/STENT PLACEMENT Right 07/23/2019   Procedure: CYSTOSCOPY/URETEROSCOPY/HOLMIUM LASER/STENT Exchange;  Surgeon: Abbie Sons, MD;  Location: [facility] ORS;  Service: Urology;  Laterality: Right;   CYSTOSCOPY/URETEROSCOPY/HOLMIUM LASER/STENT PLACEMENT Right 07/30/2019   Procedure: CYSTOSCOPY/URETEROSCOPY/HOLMIUM LASER/STENT Exchange;  Surgeon: Abbie Sons, MD;  Location: [facility] ORS;  Service: Urology;  Laterality: Right;   ELECTROMAGNETIC NAVIGATION BROCHOSCOPY N/A 03/09/2016   Procedure: ELECTROMAGNETIC NAVIGATION BRONCHOSCOPY;  Surgeon: Flora Lipps, MD;  Location: [facility]  ORS;  Service: Cardiopulmonary;  Laterality: N/A;   GASTROSTOMY TUBE PLACEMENT     REMOVAL OF GASTROSTOMY TUBE     TONSILLECTOMY  2015   TRACHEOSTOMY     VIDEO BRONCHOSCOPY WITH ENDOBRONCHIAL NAVIGATION N/A 06/08/2020   Procedure: VIDEO BRONCHOSCOPY WITH ENDOBRONCHIAL NAVIGATION;  Surgeon: Tyler Pita, MD;  Location: [facility] ORS;  Service: Pulmonary;  Laterality: N/A;   VIDEO BRONCHOSCOPY WITH  ENDOBRONCHIAL ULTRASOUND N/A 06/08/2020   Procedure: VIDEO BRONCHOSCOPY WITH ENDOBRONCHIAL ULTRASOUND;  Surgeon: Tyler Pita, MD;  Location: [facility] ORS;  Service: Pulmonary;  Laterality: N/A;   VIDEO BRONCHOSCOPY WITH ENDOBRONCHIAL ULTRASOUND Right 08/02/2021   Procedure: BRONCHOSCOPY WITH ENDOBRONCHIAL ULTRASOUND;  Surgeon: Tyler Pita, MD;  Location: [facility] ORS;  Service: Pulmonary;  Laterality: Right;   Social History:  reports that he has been smoking cigarettes. He has a 60.00 pack-year smoking history. He has never used smokeless tobacco. He reports that he does not drink alcohol and does not use drugs.  No Known Allergies Family History  Problem Relation Age of Onset   Diabetes Brother    Dementia Mother    Family history: Family history reviewed and not pertinent  Prior to Admission medications   Medication Sig Start Date End Date Taking? Authorizing Provider  acetaminophen (TYLENOL) 500 MG tablet Take 500 mg by mouth every 4 (four) hours as needed for moderate pain.    [provider]  albuterol (PROVENTIL) (2.5 MG/3ML) 0.083% nebulizer solution Take 3 mLs (2.5 mg total) by nebulization every 6 (six) hours as needed for wheezing or shortness of breath. 01/25/22   Tyler Pita, MD  amLODipine (NORVASC) 5 MG tablet Take 5 mg by mouth daily. 02/16/16   [provider]  Ascorbic Acid (VITAMIN C PO) Take 1 tablet by mouth daily in the afternoon.    [provider]  aspirin EC 81 MG EC tablet Take 1 tablet (81 mg total) by mouth daily. 07/12/19   Fritzi Mandes, MD  atorvastatin (LIPITOR) 80 MG tablet Take 80 mg by mouth daily at 6 PM.     [provider]  azithromycin (ZITHROMAX) 250 MG tablet TAKE 1 TABLET BY MOUTH 3 TIMES A WEEK TAKE ON MONDAY  [facility]  AND FRIDAY 02/02/22   Tyler Pita, MD  BREZTRI AEROSPHERE 160-9-4.8 MCG/ACT AERO INHALE 2 PUFFS INTO THE LUNGS ONCE [place] AND AT BEDTIME 01/20/22   Tyler Pita, MD   Cholecalciferol 2000 UNITS CAPS Take 2,000 Units by mouth daily.    [provider]  doxycycline (VIBRA-TABS) 100 MG tablet Take 1 tablet (100 mg total) by mouth 2 (two) times daily. 01/21/22   Tyler Pita, MD  FLUoxetine (PROZAC) 10 MG capsule Take 10 mg by mouth daily.    [provider]  gabapentin (NEURONTIN) 600 MG tablet Take 1 tablet (600 mg total) by mouth 2 (two) times daily. 01/25/22 01/25/23  Tyler Pita, MD  HYDROcodone-acetaminophen (NORCO/VICODIN) 5-325 MG tablet Take by mouth. Take 1 tablet by mouth every 12 (twelve) hours as needed for Pain 01/18/22   [provider]  levETIRAcetam (KEPPRA) 500 MG tablet Take 500 mg by mouth 2 (two) times daily.    [provider]  levothyroxine (SYNTHROID) 112 MCG tablet Take 112 mcg by mouth daily. 05/28/20   [provider]  magnesium oxide (MAG-OX) 400 MG tablet Take 400 mg by mouth 2 (two) times daily.    [provider]  meloxicam (MOBIC) 15 MG tablet Take 1 tablet (15  mg total) by mouth daily. Take with largest meal of the day. 09/24/21 09/24/22  Tyler Pita, MD  methylPREDNISolone (MEDROL DOSEPAK) 4 MG TBPK tablet Use as directed 01/21/22   Tyler Pita, MD  Multiple Vitamin (MULTIVITAMIN) tablet Take 1 tablet by mouth daily.    [provider]  Nebulizers (COMPRESSOR/NEBULIZER) MISC Use as directed 01/25/22   Tyler Pita, MD  Omega-3 Fatty Acids (FISH OIL) 1000 MG CAPS Take 1,000 mg by mouth daily.    [provider]  omeprazole (PRILOSEC) 20 MG capsule Take 20 mg by mouth daily.    [provider]  VENTOLIN HFA 108 (90 Base) MCG/ACT inhaler Inhale 1-2 puffs into the lungs every 6 (six) hours as needed (for shortness of breath/wheezing.). 12/14/18   Flora Lipps, MD   Physical Exam: Vitals:   02/05/22 1900 02/05/22 1930 02/05/22 1939 02/05/22 1944  BP: (!) 89/76 111/70    Pulse: 88     Resp: 17 15    Temp:      TempSrc:       SpO2: 99%  (!) 89% 92%  Weight:      Height:       Constitutional: appears older than chronological age, frail, NAD, calm, comfortable Eyes: PERRL, lids and conjunctivae normal ENMT: Mucous membranes are moist. Posterior pharynx clear of any exudate or lesions. Age-appropriate dentition. Hearing appropriate Neck: normal, supple, no masses, no thyromegaly Respiratory: Diffuse decreased lung sounds bilaterally on auscultation, negative for crackles and no wheezing.  Increased respiratory effort. + accessory muscle use.  High flow nasal cannula in place at 6 L Cardiovascular: Regular rate and rhythm, no murmurs / rubs / gallops. No extremity edema. 2+ pedal pulses. No carotid bruits.  Abdomen: Morbidly obese abdomen, no tenderness, no masses palpated, no hepatosplenomegaly. Bowel sounds positive.  Musculoskeletal: no clubbing / cyanosis. No joint deformity upper and lower extremities. Good ROM, no contractures, no atrophy. Normal muscle tone.  Skin: no rashes, lesions, ulcers. No induration Neurologic: Sensation intact. Strength 5/5 in all 4.  Psychiatric: Normal judgment and insight. Alert and oriented x 3. Normal mood.   EKG: independently reviewed, showing sinus rhythm with rate of 78, QTc 444  Chest x-ray on Admission: I personally reviewed and I agree with radiologist reading as below.  CT Angio Chest PE W and/or Wo Contrast  Result Date: 02/05/2022 CLINICAL DATA:  Increased shortness of breath. Smoker. Reports right upper lung collapse for 3 weeks. EXAM: CT ANGIOGRAPHY CHEST WITH CONTRAST TECHNIQUE: Multidetector CT imaging of the chest was performed using the standard protocol during bolus administration of intravenous contrast. Multiplanar CT image reconstructions and MIPs were obtained to evaluate the vascular anatomy. RADIATION DOSE REDUCTION: This exam was performed according to the departmental dose-optimization program which includes automated exposure control, adjustment of the mA  and/or kV according to patient size and/or use of iterative reconstruction technique. CONTRAST:  130mL OMNIPAQUE IOHEXOL 350 MG/ML SOLN COMPARISON:  Current chest radiograph.  Chest CT, 12/21/2021. FINDINGS: Cardiovascular: Pulmonary arteries are well opacified. Respiratory motion mildly limits assessment of the smaller segmental/subsegmental vessels, particularly to the left lower lobe. Allowing for this, there is no evidence of a pulmonary embolism. Heart is normal in size. Left and circumflex coronary artery calcifications. No pericardial effusion. Aorta normal in caliber. Aortic atherosclerosis. No dissection. Mediastinum/Nodes: Mild mediastinal adenopathy. Precarinal node measures 1.7 cm short axis. Subcarinal node measures 1.7 cm in short axis. Enlarged left hilar node, 1.4 cm in short axis. No mediastinal or hilar  masses. Trachea is partly collapse, exam appears extra tori. Trachea otherwise unremarkable. Normal esophagus. Lungs/Pleura: Large moderate right pleural effusion. Right upper lobe bronchus is occluded. There is surrounding soft tissue, contiguous with the partly, which could reflect a mass, approximately 4 x 3.4 cm transversely. There is patchy airspace consolidation in the right lower lobe collapsed right upper lobe. Right middle lobe is collapsed. Additional patchy opacities noted in the right upper lobe. Patchy areas of ground-glass opacity are noted in the left lower lobe and anteromedial left upper lobe. 8 mm nodule, subpleural, left lower lobe, image 114/5. Moderate centrilobular emphysema. No left pleural effusion. No pneumothorax. Upper Abdomen: No acute findings. No visualized liver or adrenal mass. Dependent gallstone. Musculoskeletal: No fracture or acute finding. No bone lesion. No chest wall mass. Review of the MIP images confirms the above findings. IMPRESSION: 1. No evidence of a pulmonary embolism. 2. Possible central mass versus collapsed lung along the superior right hilum, where  the right upper lobe bronchus is occluded. 3. Patchy areas of right upper and lower lobe opacity consistent with multifocal pneumonia. 4. Moderate right pleural effusion. 5. Patchy ground-glass type opacities in the left upper and left lower lobe may reflect additional areas of infection, but are similar to the prior CT, and may be chronic. 6. 8 mm left lower lobe nodule. 7. Mediastinal and left hilar lymphadenopathy unchanged from the prior CT. 8. Coronary artery calcifications and aortic atherosclerosis. Aortic Atherosclerosis (ICD10-I70.0) and Emphysema (ICD10-J43.9). Electronically Signed   By: Lajean Manes M.D.   On: 02/05/2022 16:41   DG Chest Portable 1 View  Result Date: 02/05/2022 CLINICAL DATA:  Increasing shortness of breath EXAM: PORTABLE CHEST 1 VIEW COMPARISON:  Previous chest radiographs done on 08/02/2021 and CT chest done on 12/21/2021 FINDINGS: Transverse diameter of heart is increased. There is slight prominence of interstitial markings in left lung. There is decreased volume in right lung. There is pleural density in the periphery of right lung. There is homogeneous opacity in right upper and right lower lung fields. Left lateral CP angle is clear. There is no pneumothorax. IMPRESSION: There is interval decrease in volume in right lung along with increasing amount of right pleural effusion. Part of the increased opacity in right lung may be due to underlying neoplastic process and pneumonia. Prominence of interstitial markings in left lung may suggest interstitial edema or interstitial pneumonia. Electronically Signed   By: Elmer Picker M.D.   On: 02/05/2022 14:57    Labs on Admission: I have personally reviewed following labs  CBC: Recent Labs  Lab 02/05/22 1445  WBC 13.0*  NEUTROABS 10.5*  HGB 13.1  HCT 42.6  MCV 94.0  PLT 443   Basic Metabolic Panel: Recent Labs  Lab 02/05/22 1445  NA 142  K 4.6  CL 105  CO2 32  GLUCOSE 86  BUN 14  CREATININE 1.43*   CALCIUM 8.9   GFR: Estimated Creatinine Clearance: 66.2 mL/min (A) (by C-G formula based on SCr of 1.43 mg/dL (H)).  Liver Function Tests: Recent Labs  Lab 02/05/22 1445  AST 24  ALT 18  ALKPHOS 85  BILITOT 0.7  PROT 6.6  ALBUMIN 3.6   Urine analysis:    Component Value Date/Time   COLORURINE AMBER (A) 07/05/2019 1633   APPEARANCEUR Cloudy (A) 08/12/2019 0848   LABSPEC 1.023 07/05/2019 1633   PHURINE 6.0 07/05/2019 1633   GLUCOSEU Negative 08/12/2019 0848   HGBUR MODERATE (A) 07/05/2019 1633   BILIRUBINUR Negative 08/12/2019 0848  KETONESUR 5 (A) 07/05/2019 1633   PROTEINUR Negative 08/12/2019 0848   PROTEINUR 100 (A) 07/05/2019 1633   NITRITE Negative 08/12/2019 0848   NITRITE POSITIVE (A) 07/05/2019 1633   LEUKOCYTESUR 2+ (A) 08/12/2019 0848   LEUKOCYTESUR MODERATE (A) 07/05/2019 1633   CRITICAL CARE Performed by: Dr. Tobie Poet  Total critical care time: 35 minutes  Critical care time was exclusive of separately billable procedures and treating other patients.  Critical care was necessary to treat or prevent imminent or life-threatening deterioration.  Critical care was time spent personally by me on the following activities: development of treatment plan with patient and/or surrogate as well as nursing, discussions with consultants, evaluation of patient's response to treatment, examination of patient, obtaining history from patient or surrogate, ordering and performing treatments and interventions, ordering and review of laboratory studies, ordering and review of radiographic studies, pulse oximetry and re-evaluation of patient's condition.  Dr. Tobie Poet [facility]  If 7PM-7AM, please contact overnight-coverage provider If 7AM-7PM, please contact day coverage provider [email or web address]  02/05/2022, 8:00 PM

## 2022-02-05 NOTE — ED Triage Notes (Signed)
Pt arrives via EMS with increased shortness of breath. + daily smoker, state right upper lobe lung collapse x 3 weeks with no intervention from pulmonology. Pt also reports "spots on lungs" that he is having biopsy of this week. Pt was room air saturation of 70's, wears o2 3L [place] at night and while napping. EMS placed on non rebreather with saturation 90's gave breathing treatment en-route. Family was giving breathing tx when EMS arrived. Pt alert and oriented.

## 2022-02-05 NOTE — Assessment & Plan Note (Signed)
Follow sputum cultures

## 2022-02-05 NOTE — Assessment & Plan Note (Addendum)
Met for sepsis with tachypnea, leukocytosis, source of multifocal pneumonia vs lung mass causing airway obstruction --Continue Rocephin, Zithromax,  - Scheduled DuoNebs TID --Solu-Medrol 40 mg IV BID --Pulmicort nebs BID --Pulmonology consulted, appreciate recommendations --Follow up MRSA PCR, sputum culture, respiratory to induce sputum --Respiratory viral panel pending --O2 supplementation, goal spO2 >88% - Incentive spirometry, flutter valve  10/9: on 10 L/min HFNC 10/10: placed on 45 L/min heated HFNC more for comfort that O2 sats, pt feels better on higher flow rate

## 2022-02-05 NOTE — Progress Notes (Signed)
PHARMACIST - PHYSICIAN COMMUNICATION  CONCERNING:  Enoxaparin (Lovenox) for DVT Prophylaxis    RECOMMENDATION: Patient was prescribed enoxaparin 40mg  q24 hours for VTE prophylaxis.   Filed Weights   02/05/22 1435  Weight: (!) 137.4 kg (303 lb)    Body mass index is 43.48 kg/m.  Estimated Creatinine Clearance: 66.2 mL/min (A) (by C-G formula based on SCr of 1.43 mg/dL (H)).   Based on [place] patient is candidate for enoxaparin 0.5mg /kg TBW SQ every 24 hours based on BMI being >30.  DESCRIPTION: Pharmacy has adjusted enoxaparin dose per [facility] policy.  Patient is now receiving enoxaparin 0.5 mg/kg every 24 hours   Dara Hoyer, PharmD PGY-1 Pharmacy Resident 02/05/2022 6:01 PM

## 2022-02-05 NOTE — Assessment & Plan Note (Signed)
See acute respiratory failure.

## 2022-02-05 NOTE — Assessment & Plan Note (Signed)
Has outpatient biopsy scheduled for 02/11/2019.  Follows with pulmonology. They are consulted, see recommendations.

## 2022-02-05 NOTE — Assessment & Plan Note (Signed)
-   Hydralazine PRN

## 2022-02-05 NOTE — Assessment & Plan Note (Signed)
-   This complicates overall care and prognosis.  

## 2022-02-05 NOTE — Assessment & Plan Note (Signed)
Sliding scale Novolog Adjust insulin PRN

## 2022-02-05 NOTE — ED Provider Notes (Signed)
[facility] Provider Note    Event Date/Time   First MD Initiated Contact with Patient 02/05/22 1429     (approximate)   History   Shortness of Breath   HPI  Shawn Lindsey is a 71 y.o. male with history of COPD on 3 L of oxygen at nighttime who comes in with concerns for shortness of breath.  Patient reports about a month of shortness of breath.  Patient reports he did not feel any more short of breath today than he did recently but that he was at a family wedding and that his family member is a [employer] scientist and they noted that he was short of breath and they told him he needed to go to the ER to be evaluated.  They called the EMS and they noted that his oxygen levels were in the 70s.  They did get better after a DuoNeb.  Patient given 2 DuoNebs.  However patient was going to come into the emergency room to be further evaluated.  Patient does report swelling in his bilateral legs but denies being on any Lasix.   I reviewed the office note from pulmonary on 9/26.  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  He is planned to have a biopsy but this has not been done yet.  Physical Exam   Triage Vital Signs: Blood pressure 130/62, pulse 77, temperature 98 F (36.7 C), temperature source Oral, resp. rate (!) 30, height 5\' 10"  (1.778 m), weight (!) 137.4 kg, SpO2 90 %.  Most recent vital signs: Vitals:   02/05/22 1434 02/05/22 1436  BP: 130/62   Pulse: 77   Resp: (!) 30   Temp:  98 F (36.7 C)  SpO2: 90%      General: Awake, no distress.  CV:  Good peripheral perfusion.  Resp:  Normal effort.  Abd:  No distention.  Soft nontender Other:  Swelling in the bilateral legs.   ED Results / Procedures / Treatments   Labs (all labs ordered are listed, but only abnormal results are displayed) Labs Reviewed  SARS CORONAVIRUS 2 BY RT PCR  CBC WITH DIFFERENTIAL/PLATELET  COMPREHENSIVE METABOLIC PANEL   BRAIN NATRIURETIC PEPTIDE  PROCALCITONIN  BLOOD GAS, VENOUS  TROPONIN I (HIGH SENSITIVITY)     EKG  My interpretation of EKG: Normal sinus rate of 78 without any ST elevation or T wave inversions, normal intervals   RADIOLOGY I have reviewed the xray personally and interpreted patient has mass noted on the right side with whited out lung.   PROCEDURES:  Critical Care performed: Yes, see critical care procedure note(s)  .1-3 Lead EKG Interpretation  Performed by: Vanessa Osborne, MD Authorized by: Vanessa [place], MD     Interpretation: normal     ECG rate:  77   ECG rate assessment: normal     Rhythm: sinus rhythm     Ectopy: none     Conduction: normal   .Critical Care  Performed by: Vanessa [place], MD Authorized by: Vanessa [place], MD   Critical care provider statement:    Critical care time (minutes):  30   Critical care was necessary to treat or prevent imminent or life-threatening deterioration of the following conditions:  Respiratory failure   Critical care was time spent personally by me on the following activities:  Development of treatment plan with patient or surrogate, discussions with consultants, evaluation of patient's response to treatment, examination  of patient, ordering and review of laboratory studies, ordering and review of radiographic studies, ordering and performing treatments and interventions, pulse oximetry, re-evaluation of patient's condition and review of old [facility]: Medications  ipratropium-albuterol (DUONEB) 0.5-2.5 (3) MG/3ML nebulizer solution 3 mL (has no administration in time range)  methylPREDNISolone sodium succinate (SOLU-MEDROL) 125 mg/2 mL injection 125 mg (has no administration in time range)     IMPRESSION / MDM / ASSESSMENT AND PLAN / ED COURSE  I reviewed the triage vital signs and the nursing notes.   Patient's presentation is most consistent with acute presentation with potential threat to  life or bodily function.   Patient comes in with shortness of breath improving with the DuoNeb.  Will treat as COPD exacerbation but x-ray ordered to evaluate for pneumothorax, mass.  X-ray does show whited out right lung which is concerning for mass versus edema.  Given concern for potential cancer will get CT PE evaluate for any pulmonary embolism.  Patient will be handed off to oncoming team pending these results and admission to the hospital.  Patient currently on 6 L of oxygen satting 88 to 90%.  VBG does not show significantly elevated CO2 therefore we can hold off on BiPAP at this time.  Patient be handed off to oncoming team pending the rest of the results and admission  The patient is on the cardiac monitor to evaluate for evidence of arrhythmia and/or significant heart rate changes.      FINAL CLINICAL IMPRESSION(S) / ED DIAGNOSES   Final diagnoses:  Acute respiratory failure with hypoxia ([place])     Rx / DC Orders   ED Discharge Orders     None        Note:  This document was prepared using Dragon voice recognition software and may include unintentional dictation errors.   Vanessa [place], MD 02/05/22 [id number]

## 2022-02-05 NOTE — Assessment & Plan Note (Signed)
-   Atorvastatin 80 mg daily resumed 

## 2022-02-06 DIAGNOSIS — J9621 Acute and chronic respiratory failure with hypoxia: Secondary | ICD-10-CM | POA: Diagnosis not present

## 2022-02-06 DIAGNOSIS — J90 Pleural effusion, not elsewhere classified: Secondary | ICD-10-CM | POA: Diagnosis present

## 2022-02-06 DIAGNOSIS — J189 Pneumonia, unspecified organism: Secondary | ICD-10-CM | POA: Diagnosis not present

## 2022-02-06 DIAGNOSIS — R918 Other nonspecific abnormal finding of lung field: Secondary | ICD-10-CM | POA: Diagnosis not present

## 2022-02-06 LAB — BASIC METABOLIC PANEL
Anion gap: 6 (ref 5–15)
BUN: 14 mg/dL (ref 8–23)
CO2: 28 mmol/L (ref 22–32)
Calcium: 8.3 mg/dL — ABNORMAL LOW (ref 8.9–10.3)
Chloride: 107 mmol/L (ref 98–111)
Creatinine, Ser: 1.12 mg/dL (ref 0.61–1.24)
GFR, Estimated: >60 mL/min (ref >60)
Glucose, Bld: 160 mg/dL — ABNORMAL HIGH (ref 70–99)
Potassium: 4.7 mmol/L (ref 3.5–5.1)
Sodium: 141 mmol/L (ref 135–145)

## 2022-02-06 LAB — PROCALCITONIN: Procalcitonin: <0.1 ng/mL

## 2022-02-06 LAB — HEMOGLOBIN A1C
Hgb A1c MFr Bld: 6 % — ABNORMAL HIGH (ref 4.8–5.6)
Mean Plasma Glucose: 125.5 mg/dL

## 2022-02-06 LAB — CBC
HCT: 42.1 % (ref 39.0–52.0)
Hemoglobin: 13.1 g/dL (ref 13.0–17.0)
MCH: 29 pg (ref 26.0–34.0)
MCHC: 31.1 g/dL (ref 30.0–36.0)
MCV: 93.3 fL (ref 80.0–100.0)
Platelets: 207 10*3/uL (ref 150–400)
RBC: 4.51 MIL/uL (ref 4.22–5.81)
RDW: 16.2 % — ABNORMAL HIGH (ref 11.5–15.5)
WBC: 10.3 10*3/uL (ref 4.0–10.5)
nRBC: 0 % (ref 0.0–0.2)

## 2022-02-06 LAB — CBG MONITORING, ED
Glucose-Capillary: 167 mg/dL — ABNORMAL HIGH (ref 70–99)
Glucose-Capillary: 130 mg/dL — ABNORMAL HIGH (ref 70–99)
Glucose-Capillary: 132 mg/dL — ABNORMAL HIGH (ref 70–99)

## 2022-02-06 LAB — GLUCOSE, CAPILLARY: Glucose-Capillary: 144 mg/dL — ABNORMAL HIGH (ref 70–99)

## 2022-02-06 MED ORDER — FLUOXETINE HCL 10 MG PO CAPS
10.0000 mg | ORAL_CAPSULE | Freq: Every evening | ORAL | Status: DC
  Administered 2022-02-06 – 2022-02-13 (×8): 10 mg via ORAL
  Filled 2022-02-06 (×9): qty 1

## 2022-02-06 MED ORDER — BUDESONIDE 0.5 MG/2ML IN SUSP
0.5000 mg | Freq: Two times a day (BID) | RESPIRATORY_TRACT | Status: DC
  Administered 2022-02-06 – 2022-02-14 (×16): 0.5 mg via RESPIRATORY_TRACT
  Filled 2022-02-06 (×16): qty 2

## 2022-02-06 MED ORDER — AZITHROMYCIN 250 MG PO TABS
500.0000 mg | ORAL_TABLET | Freq: Every day | ORAL | Status: DC
  Administered 2022-02-06 – 2022-02-08 (×3): 500 mg via ORAL
  Filled 2022-02-06 (×3): qty 2

## 2022-02-06 MED ORDER — LEVETIRACETAM 500 MG PO TABS
500.0000 mg | ORAL_TABLET | Freq: Two times a day (BID) | ORAL | Status: DC
  Administered 2022-02-06 – 2022-02-14 (×16): 500 mg via ORAL
  Filled 2022-02-06 (×17): qty 1

## 2022-02-06 MED ORDER — GABAPENTIN 600 MG PO TABS
600.0000 mg | ORAL_TABLET | Freq: Two times a day (BID) | ORAL | Status: DC
  Administered 2022-02-06 – 2022-02-14 (×16): 600 mg via ORAL
  Filled 2022-02-06 (×17): qty 1

## 2022-02-06 MED ORDER — HYDROCODONE-ACETAMINOPHEN 5-325 MG PO TABS
1.0000 | ORAL_TABLET | Freq: Two times a day (BID) | ORAL | Status: DC | PRN
  Administered 2022-02-08 – 2022-02-09 (×2): 1 via ORAL
  Filled 2022-02-06 (×2): qty 1

## 2022-02-06 MED ORDER — IPRATROPIUM-ALBUTEROL 0.5-2.5 (3) MG/3ML IN SOLN
3.0000 mL | RESPIRATORY_TRACT | Status: DC | PRN
  Administered 2022-02-06 (×3): 3 mL via RESPIRATORY_TRACT
  Filled 2022-02-06 (×3): qty 3

## 2022-02-06 MED ORDER — ZOLPIDEM TARTRATE 5 MG PO TABS
5.0000 mg | ORAL_TABLET | Freq: Every day | ORAL | Status: DC
  Administered 2022-02-06 – 2022-02-13 (×8): 5 mg via ORAL
  Filled 2022-02-06 (×8): qty 1

## 2022-02-06 MED ORDER — IPRATROPIUM-ALBUTEROL 0.5-2.5 (3) MG/3ML IN SOLN
3.0000 mL | RESPIRATORY_TRACT | Status: DC
  Administered 2022-02-06 – 2022-02-07 (×5): 3 mL via RESPIRATORY_TRACT
  Filled 2022-02-06 (×5): qty 3

## 2022-02-06 MED ORDER — ADULT MULTIVITAMIN W/MINERALS CH
1.0000 | ORAL_TABLET | Freq: Every day | ORAL | Status: DC
  Administered 2022-02-06 – 2022-02-14 (×9): 1 via ORAL
  Filled 2022-02-06 (×8): qty 1

## 2022-02-06 MED ORDER — DM-GUAIFENESIN ER 30-600 MG PO TB12
1.0000 | ORAL_TABLET | Freq: Two times a day (BID) | ORAL | Status: DC
  Administered 2022-02-06 – 2022-02-14 (×17): 1 via ORAL
  Filled 2022-02-06 (×17): qty 1

## 2022-02-06 MED ORDER — METHYLPREDNISOLONE SODIUM SUCC 40 MG IJ SOLR
40.0000 mg | Freq: Two times a day (BID) | INTRAMUSCULAR | Status: DC
  Administered 2022-02-06 – 2022-02-10 (×8): 40 mg via INTRAVENOUS
  Filled 2022-02-06 (×8): qty 1

## 2022-02-06 NOTE — ED Notes (Signed)
Pt placed on bedpan but sheets and pad soiled with feces. Full linen change at this time. New sheet and pad placed on bed.

## 2022-02-06 NOTE — Progress Notes (Signed)
Pt was welcoming of visit. Pt shared he felt he had a good support system and was feeling good. Welcoming of more visits. Pt was tired tonight and wanting to rest during his breathing treatments. Chaplain will send for follow up

## 2022-02-06 NOTE — Progress Notes (Signed)
Progress Note   Patient: Shawn Lindsey WNU:[id number] DOB: 1950/11/25 DOA: 02/05/2022     1 DOS: the patient was seen and examined on 02/06/2022   Brief hospital course: 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  In the ED, afebrile, RR 30, HR 70's-80's, stable BP, and hypoxic with spO2 of 90% on 6 L/min supplement O2.  Labs notable for creatinine of 1.43, WBC 13, BNP 262.3.  High sensitive troponin was 19.  Procalcitonin < 0.10.  CTA of the chest was read as negative for PE.   Findings consistent with multifocal pneumonia and possible neoplasm.  It showed  Large right pleural effusion, occluded RUL bronchus with continguous soft tissue felt to possibly reflect a mass 4 x 3.4 cm in size.  RML, RLL are collapsed.  Patchy airspace opacities RUL, LLL and LUL's, LLL 8 mm nodule.  Pt was admitted to medicine service, started on IV antibiotics for presumed community-acquired multifocal pneumonia.  Pulmonology consulted.  Patient is followed by [facility], primarily Dr. Patsey Berthold.  There is a CT-guided lung biopsy scheduled as outpatient for 10/12.    Assessment and Plan: * Acute on chronic respiratory failure with hypoxia (HCC) Met for sepsis with tachypnea, leukocytosis, source of multifocal pneumonia. --Continue Rocephin, Zithromax - Scheduled DuoNebs Q4H --Solu-Medrol 40 mg IV BID --Pulmicort nebs BID --Pulmonology consulted, appreciate recommendations --Follow up MRSA PCR, sputum culture, respiratory to induce sputum --Respiratory viral panel pending --O2 supplementation, goal spO2 >88% - Incentive spirometry, flutter valve   Pleural effusion on right Etiology  TBD, but there is concern for recurrent lung cancer. --Pulmonology following --Plan for US-guided thoracentesis with pleural fluid cytology   Obesity, Class III, BMI 40-49.9 (morbid obesity) ([place]) - This complicates overall care and prognosis.   Back pain Acute on chronic, at least in part related to pneumonia, pleural effusion, ?musculoskeletal in setting of cough --Resumed home Norco - Lidocaine patch ordered - Acetaminophen --IV analgesics PRN for breakthrough pain --Mobilize  Multifocal pneumonia See acute respiratory failure.   History of Klebsiella pneumonia Follow sputum cultures  Type 2 diabetes mellitus with diabetic polyneuropathy, with long-term current use of insulin (HCC) Sliding scale Novolog Adjust insulin PRN  Mass of upper lobe of right lung Has outpatient biopsy scheduled for 02/11/2019.  Follows with pulmonology. They are consulted, see recommendations.  Hyperlipidemia - Atorvastatin 80 mg daily resumed  COPD (chronic obstructive pulmonary disease) ([place]) See respiratory failure  HTN (hypertension) - Hydralazine PRN        Subjective: Pt seen in the ED today, holding for a bed.  Reports improvement in his breathing, but remains dyspneic.  He reports biopsy planned this week and asks me to contact his pulmonology team about getting it done while here.  Reports recent improved lower extremity edema.   Physical Exam: Vitals:   02/06/22 1734 02/06/22 1809 02/06/22 1935 02/06/22 1935  BP: (!) 145/85 137/70  (!) 145/69  Pulse: 90 94  88  Resp: 16 16  (!) 24  Temp: 97.9 F (36.6 C) 98 F (36.7 C)  98.1 F (36.7 C)  TempSrc: Oral     SpO2: 94% 94% 94% 94%  Weight:      Height:        General exam: awake,  alert, no acute distress, obese HEENT: HFNC in place, moist mucus membranes, hearing grossly normal  Respiratory system: diffuse rhonchi, absent breath sounds on right, no wheezes, mildly increased respiratory effort, on 8 L/min HFNC  O2. Cardiovascular system: normal S1/S2, RRR, no peripheral edema (signs of recent improved LE edema with skin wrinkling) Gastrointestinal system: soft, NT, ND Central nervous system: A&O x4. no gross focal neurologic deficits, normal speech Extremities: lower extremity venous stasis, normal tone Skin: dry, intact, normal temperature Psychiatry: normal mood, congruent affect, judgement and insight appear normal   Data Reviewed:  Notable labs --  glucose 160, A1c 6.0%  Family Communication: none  Disposition: Status is: Inpatient Remains inpatient appropriate because: on IV antibiotics, ongoing evaluation, remains on significant amount of supplemental oxygen.   Planned Discharge Destination: Home    Time spent: 45 minutes  Author: Ezekiel Slocumb, DO 02/06/2022 7:58 PM  For on call review [email or web address].

## 2022-02-06 NOTE — Consult Note (Signed)
History and Physical   Shawn Lindsey BWL:[id number] DOB: January 13, 1951 DOA: 02/05/2022  PCP: Leonel Ramsay, MD  Outpatient Specialists: Dr. Patsey Berthold, pulmonology Patient coming from: Home   Chief Concern: Shortness of breath  HPI Patient admitted for acute on chronic resp failure Now on 8L [place]  Patient was supposed to have CT guided Biopsy of lung mass this week I am concerned about recurrent lung cancer  CT chest 10/7  reviewed with Patient in detail     He denies fever. He endorses cough with exertion, that is productive of brown phelm.   He denies dysuria, hematuria, diarrhea. He endorses right back mid thoracic pain states that this started a few days ago.  He denies trauma to his person.  He reports the pain is worse with palpation and is positional.  Social history: He lives at home with his girlfriend, [name]. He smokes 1 ppd. He denies etoh and recreational drugs. He is retired and formerly worked as a Sales promotion account executive.    ED Course: Discussed with emergency medicine provider, patient requiring hospitalization for chief concerns of acute on chronic hypoxia.    Principal Problem:   Acute on chronic respiratory failure with hypoxia (HCC) Active Problems:   HTN (hypertension)   COPD (chronic obstructive pulmonary disease) (HCC)   Hyperlipidemia   Mass of upper lobe of right lung   Type 2 diabetes mellitus with diabetic polyneuropathy, with long-term current use of insulin (HCC)   History of Klebsiella pneumonia   Multifocal pneumonia   Back pain   Obesity, Class III, BMI 40-49.9 (morbid obesity) ([place])   Assessment and Plan:  Chart reviewed.   DVT prophylaxis: Enoxaparin Code Status: Full code Diet: Heart healthy Family Communication: No Disposition Plan: Pending clinical course and pulmonology evaluation Consults called: Pulmonology Admission status: Progressive cardiac, inpatient  Past Medical History:  Diagnosis Date   Anxiety     mild   Cancer ([place]) 2015   tonsil   COPD (chronic obstructive pulmonary disease) ([place])    Diabetes mellitus without complication ([place])    controlled with diet   Elevated cholesterol    GERD (gastroesophageal reflux disease)    History of kidney stones    Hypertension    Hypothyroidism    Morbid obesity with BMI of 40.0-44.9, adult ([place])    Pneumonia    klebsiella pneumoniae; extended stay in hospital requiring tracheotomy and gastrotomy   Seizures ([place]) 2015   related to sepsis   Septic shock ([place])    Sleep apnea 2019   has a Cpap machime    Stroke ([place]) 2007   no peripheral vision on left side of both eyes & left arm gets numb under stress   Past Surgical History:  Procedure Laterality Date   CYSTOSCOPY W/ RETROGRADES Right 07/23/2019   Procedure: CYSTOSCOPY WITH RETROGRADE PYELOGRAM;  Surgeon: Abbie Sons, MD;  Location: [facility];  Service: Urology;  Laterality: Right;   CYSTOSCOPY WITH STENT PLACEMENT Right 07/05/2019   Procedure: CYSTOSCOPY WITH STENT PLACEMENT;  Surgeon: Abbie Sons, MD;  Location: [facility];  Service: Urology;  Laterality: Right;   CYSTOSCOPY/URETEROSCOPY/HOLMIUM LASER/STENT PLACEMENT Right 07/23/2019   Procedure: CYSTOSCOPY/URETEROSCOPY/HOLMIUM LASER/STENT Exchange;  Surgeon: Abbie Sons, MD;  Location: [facility];  Service: Urology;  Laterality: Right;   CYSTOSCOPY/URETEROSCOPY/HOLMIUM LASER/STENT PLACEMENT Right 07/30/2019   Procedure: CYSTOSCOPY/URETEROSCOPY/HOLMIUM LASER/STENT Exchange;  Surgeon: Abbie Sons, MD;  Location: [facility];  Service: Urology;  Laterality: Right;   ELECTROMAGNETIC NAVIGATION BROCHOSCOPY N/A 03/09/2016  Procedure: ELECTROMAGNETIC NAVIGATION BRONCHOSCOPY;  Surgeon: Flora Lipps, MD;  Location: [facility];  Service: Cardiopulmonary;  Laterality: N/A;   GASTROSTOMY TUBE PLACEMENT     REMOVAL OF GASTROSTOMY TUBE     TONSILLECTOMY  2015   TRACHEOSTOMY     VIDEO BRONCHOSCOPY WITH ENDOBRONCHIAL NAVIGATION N/A 06/08/2020    Procedure: VIDEO BRONCHOSCOPY WITH ENDOBRONCHIAL NAVIGATION;  Surgeon: Tyler Pita, MD;  Location: [facility];  Service: Pulmonary;  Laterality: N/A;   VIDEO BRONCHOSCOPY WITH ENDOBRONCHIAL ULTRASOUND N/A 06/08/2020   Procedure: VIDEO BRONCHOSCOPY WITH ENDOBRONCHIAL ULTRASOUND;  Surgeon: Tyler Pita, MD;  Location: [facility];  Service: Pulmonary;  Laterality: N/A;   VIDEO BRONCHOSCOPY WITH ENDOBRONCHIAL ULTRASOUND Right 08/02/2021   Procedure: BRONCHOSCOPY WITH ENDOBRONCHIAL ULTRASOUND;  Surgeon: Tyler Pita, MD;  Location: [facility];  Service: Pulmonary;  Laterality: Right;   Social History:  reports that he has been smoking cigarettes. He has a 60.00 pack-year smoking history. He has never used smokeless tobacco. He reports that he does not drink alcohol and does not use drugs.  No Known Allergies Family History  Problem Relation Age of Onset   Diabetes Brother    Dementia Mother    Family history: Family history reviewed and not pertinent  Prior to Admission medications   Medication Sig Start Date End Date Taking? Authorizing Provider  acetaminophen (TYLENOL) 500 MG tablet Take 500 mg by mouth every 4 (four) hours as needed for moderate pain.    [provider]  albuterol (PROVENTIL) (2.5 MG/3ML) 0.083% nebulizer solution Take 3 mLs (2.5 mg total) by nebulization every 6 (six) hours as needed for wheezing or shortness of breath. 01/25/22   Tyler Pita, MD  amLODipine (NORVASC) 5 MG tablet Take 5 mg by mouth daily. 02/16/16   [provider]  Ascorbic Acid (VITAMIN C PO) Take 1 tablet by mouth daily in the afternoon.    [provider]  aspirin EC 81 MG EC tablet Take 1 tablet (81 mg total) by mouth daily. 07/12/19   Fritzi Mandes, MD  atorvastatin (LIPITOR) 80 MG tablet Take 80 mg by mouth daily at 6 PM.     [provider]  azithromycin (ZITHROMAX) 250 MG tablet TAKE 1 TABLET BY MOUTH 3 TIMES A WEEK TAKE ON MONDAY  [facility]  AND FRIDAY  02/02/22   Tyler Pita, MD  BREZTRI AEROSPHERE 160-9-4.8 MCG/ACT AERO INHALE 2 PUFFS INTO THE LUNGS ONCE [place] AND AT BEDTIME 01/20/22   Tyler Pita, MD  Cholecalciferol 2000 UNITS CAPS Take 2,000 Units by mouth daily.    [provider]  doxycycline (VIBRA-TABS) 100 MG tablet Take 1 tablet (100 mg total) by mouth 2 (two) times daily. 01/21/22   Tyler Pita, MD  FLUoxetine (PROZAC) 10 MG capsule Take 10 mg by mouth daily.    [provider]  gabapentin (NEURONTIN) 600 MG tablet Take 1 tablet (600 mg total) by mouth 2 (two) times daily. 01/25/22 01/25/23  Tyler Pita, MD  HYDROcodone-acetaminophen (NORCO/VICODIN) 5-325 MG tablet Take by mouth. Take 1 tablet by mouth every 12 (twelve) hours as needed for Pain 01/18/22   [provider]  levETIRAcetam (KEPPRA) 500 MG tablet Take 500 mg by mouth 2 (two) times daily.    [provider]  levothyroxine (SYNTHROID) 112 MCG tablet Take 112 mcg by mouth daily. 05/28/20   [provider]  magnesium oxide (MAG-OX) 400 MG tablet Take 400 mg by mouth 2 (two) times daily.    [provider]  meloxicam (MOBIC) 15 MG tablet Take 1 tablet (15 mg total) by mouth daily. Take with largest meal of the day. 09/24/21 09/24/22  Tyler Pita, MD  methylPREDNISolone (MEDROL DOSEPAK) 4 MG TBPK tablet Use as directed 01/21/22   Tyler Pita, MD  Multiple Vitamin (MULTIVITAMIN) tablet Take 1 tablet by mouth daily.    [provider]  Nebulizers (COMPRESSOR/NEBULIZER) MISC Use as directed 01/25/22   Tyler Pita, MD  Omega-3 Fatty Acids (FISH OIL) 1000 MG CAPS Take 1,000 mg by mouth daily.    [provider]  omeprazole (PRILOSEC) 20 MG capsule Take 20 mg by mouth daily.    [provider]  VENTOLIN HFA 108 (90 Base) MCG/ACT inhaler Inhale 1-2 puffs into the lungs every 6 (six) hours as needed (for shortness of breath/wheezing.). 12/14/18   Flora Lipps, MD    Physical Exam: Vitals:   02/06/22 1130 02/06/22 1200 02/06/22 1311 02/06/22 1500  BP: 129/72 131/78  129/74  Pulse: 88 90  93  Resp: 15 (!) 21  19  Temp:      TempSrc:      SpO2: 93% 93% 94% 93%  Weight:      Height:          Review of Systems: Gen:  Denies  fever, sweats, chills weight loss  HEENT: Denies blurred vision, double vision, ear pain, eye pain, hearing loss, nose bleeds, sore throat Cardiac:  No dizziness, chest pain or heaviness, chest tightness,edema, No JVD Resp:+cough, +sputum production, +shortness of breath,+wheezing, -hemoptysis,  Gi: Denies swallowing difficulty, stomach pain, nausea or vomiting,  Other:  All other systems negative   Physical Examination:   General Appearance: No distress  EYES PERRLA, EOM intact.   NECK Supple, No JVD Pulmonary: normal breath sounds, decreased BS RT lung CardiovascularNormal S1,S2.  No m/r/g.   Abdomen: Benign, Soft, non-tender. Skin:   warm, no rashes, no ecchymosis  Extremities: normal, no cyanosis, clubbing. Neuro:without focal findings,  speech normal  PSYCHIATRIC: Mood, affect within normal limits.   ALL OTHER ROS ARE NEGATIVE   CT Angio Chest PE W and/or Wo Contrast  Result Date: 02/05/2022 CLINICAL DATA:  Increased shortness of breath. Smoker. Reports right upper lung collapse for 3 weeks. EXAM: CT ANGIOGRAPHY CHEST WITH CONTRAST TECHNIQUE: Multidetector CT imaging of the chest was performed using the standard protocol during bolus administration of intravenous contrast. Multiplanar CT image reconstructions and MIPs were obtained to evaluate the vascular anatomy. RADIATION DOSE REDUCTION: This exam was performed according to the departmental dose-optimization program which includes automated exposure control, adjustment of the mA and/or kV according to patient size and/or use of iterative reconstruction technique. CONTRAST:  155mL OMNIPAQUE IOHEXOL 350 MG/ML SOLN COMPARISON:  Current chest radiograph.  Chest  CT, 12/21/2021. FINDINGS: Cardiovascular: Pulmonary arteries are well opacified. Respiratory motion mildly limits assessment of the smaller segmental/subsegmental vessels, particularly to the left lower lobe. Allowing for this, there is no evidence of a pulmonary embolism. Heart is normal in size. Left and circumflex coronary artery calcifications. No pericardial effusion. Aorta normal in caliber. Aortic atherosclerosis. No dissection. Mediastinum/Nodes: Mild mediastinal adenopathy. Precarinal node measures 1.7 cm short axis. Subcarinal node measures 1.7 cm in short axis. Enlarged left hilar node, 1.4 cm in short axis. No mediastinal or hilar masses. Trachea is partly collapse, exam appears extra tori. Trachea otherwise unremarkable. Normal esophagus. Lungs/Pleura: Large moderate right pleural effusion. Right upper lobe bronchus is occluded. There is surrounding soft tissue, contiguous with the  partly, which could reflect a mass, approximately 4 x 3.4 cm transversely. There is patchy airspace consolidation in the right lower lobe collapsed right upper lobe. Right middle lobe is collapsed. Additional patchy opacities noted in the right upper lobe. Patchy areas of ground-glass opacity are noted in the left lower lobe and anteromedial left upper lobe. 8 mm nodule, subpleural, left lower lobe, image 114/5. Moderate centrilobular emphysema. No left pleural effusion. No pneumothorax. Upper Abdomen: No acute findings. No visualized liver or adrenal mass. Dependent gallstone. Musculoskeletal: No fracture or acute finding. No bone lesion. No chest wall mass. Review of the MIP images confirms the above findings. IMPRESSION: 1. No evidence of a pulmonary embolism. 2. Possible central mass versus collapsed lung along the superior right hilum, where the right upper lobe bronchus is occluded. 3. Patchy areas of right upper and lower lobe opacity consistent with multifocal pneumonia. 4. Moderate right pleural effusion. 5. Patchy  ground-glass type opacities in the left upper and left lower lobe may reflect additional areas of infection, but are similar to the prior CT, and may be chronic. 6. 8 mm left lower lobe nodule. 7. Mediastinal and left hilar lymphadenopathy unchanged from the prior CT. 8. Coronary artery calcifications and aortic atherosclerosis. Aortic Atherosclerosis (ICD10-I70.0) and Emphysema (ICD10-J43.9). Electronically Signed   By: Lajean Manes M.D.   On: 02/05/2022 16:41   DG Chest Portable 1 View  Result Date: 02/05/2022 CLINICAL DATA:  Increasing shortness of breath EXAM: PORTABLE CHEST 1 VIEW COMPARISON:  Previous chest radiographs done on 08/02/2021 and CT chest done on 12/21/2021 FINDINGS: Transverse diameter of heart is increased. There is slight prominence of interstitial markings in left lung. There is decreased volume in right lung. There is pleural density in the periphery of right lung. There is homogeneous opacity in right upper and right lower lung fields. Left lateral CP angle is clear. There is no pneumothorax. IMPRESSION: There is interval decrease in volume in right lung along with increasing amount of right pleural effusion. Part of the increased opacity in right lung may be due to underlying neoplastic process and pneumonia. Prominence of interstitial markings in left lung may suggest interstitial edema or interstitial pneumonia. Electronically Signed   By: Elmer Picker M.D.   On: 02/05/2022 14:57    Labs on Admission: I have personally reviewed following labs  CBC: Recent Labs  Lab 02/05/22 1445 02/06/22 0443  WBC 13.0* 10.3  NEUTROABS 10.5*  --   HGB 13.1 13.1  HCT 42.6 42.1  MCV 94.0 93.3  PLT 210 101    Basic Metabolic Panel: Recent Labs  Lab 02/05/22 1445 02/06/22 0443  NA 142 141  K 4.6 4.7  CL 105 107  CO2 32 28  GLUCOSE 86 160*  BUN 14 14  CREATININE 1.43* 1.12  CALCIUM 8.9 8.3*    GFR: Estimated Creatinine Clearance: 84.5 mL/min (by C-G formula based on  SCr of 1.12 mg/dL).  Liver Function Tests: Recent Labs  Lab 02/05/22 1445  AST 24  ALT 18  ALKPHOS 85  BILITOT 0.7  PROT 6.6  ALBUMIN 3.6    Urine analysis:    Component Value Date/Time   COLORURINE AMBER (A) 07/05/2019 1633   APPEARANCEUR Cloudy (A) 08/12/2019 0848   LABSPEC 1.023 07/05/2019 1633   PHURINE 6.0 07/05/2019 1633   GLUCOSEU Negative 08/12/2019 0848   HGBUR MODERATE (A) 07/05/2019 1633   BILIRUBINUR Negative 08/12/2019 0848   KETONESUR 5 (A) 07/05/2019 1633   PROTEINUR Negative 08/12/2019 0848  PROTEINUR 100 (A) 07/05/2019 1633   NITRITE Negative 08/12/2019 0848   NITRITE POSITIVE (A) 07/05/2019 1633   LEUKOCYTESUR 2+ (A) 08/12/2019 0848   LEUKOCYTESUR MODERATE (A) 07/05/2019 1633    ASSESSMENT AND PLAN 71 yo white male with previous lung cancer with acute on chronic hypoxic resp failure due to worsening CXR with RT sided pleural effusion with COPD exacerbation less likely pneumonia PROCAL<0.10  Severe ACUTE Hypoxic and Hypercapnic Respiratory Failure Oxygen as needed  ABNORMAL CT CHEST Plan for US guided thoracentesis first Will defer CT guided Bx of lung mass at this time   SEVERE COPD EXACERBATION -continue IV steroids as prescribed -continue NEB THERAPY as prescribed -morphine as needed -wean fio2 as needed and tolerated Continue IV abx as prescribed Incentive spirometry, flutter valve   History of Klebsiella pneumonia - Induced sputum and sputum culture with Gram stain ordered   Mass of upper lobe of right lung - Patient has outpatient scheduling for biopsy on 02/11/2019 2020 defer at this time and plan for US thoracentesis first    MEDICATION ADJUSTMENTS/LABS AND TESTS ORDERED:    CURRENT MEDICATIONS REVIEWED AT [place]   Patient  satisfied with Plan of action and management. All questions answered    Corrin Parker, M.D.  [facility]  Medical Director [place] Director [facility] Cardio-Pulmonary Department

## 2022-02-06 NOTE — Assessment & Plan Note (Addendum)
Etiology TBD, but there is concern for recurrent lung cancer. 10/9: US thoracentesis, 1.5 L removed Pleural fluid LDH very high 298 --Pulmonology following --Follow up pleural fluid studies (cytology, cultures, acid fast smear)

## 2022-02-06 NOTE — Progress Notes (Signed)
PHARMACIST - PHYSICIAN COMMUNICATION  CONCERNING: Antibiotic IV to Oral Route Change Policy  RECOMMENDATION: This patient is receiving azithromycin by the intravenous route.  Based on criteria approved by the Pharmacy and Therapeutics Committee, the antibiotic(s) is/are being converted to the equivalent oral dose form(s).   DESCRIPTION: These criteria include: Patient being treated for a respiratory tract infection, urinary tract infection, cellulitis or clostridium difficile associated diarrhea if on metronidazole The patient is not neutropenic and does not exhibit a GI malabsorption state The patient is eating (either orally or via tube) and/or has been taking other orally administered medications for a least 24 hours The patient is improving clinically and has a Tmax < 100.5  If you have questions about this conversion, please contact the [place]  02/06/22

## 2022-02-06 NOTE — Assessment & Plan Note (Signed)
See respiratory failure

## 2022-02-06 NOTE — ED Notes (Addendum)
Breakfast tray delivered by dietary at this time. Pt requesting cough medication at this time. Dr. Arbutus Ped, DO made aware see new orders.

## 2022-02-06 NOTE — ED Notes (Signed)
Lunch meal tray delivered by dietary at this time.

## 2022-02-07 ENCOUNTER — Inpatient Hospital Stay: Payer: PPO

## 2022-02-07 ENCOUNTER — Telehealth: Payer: Self-pay | Admitting: Pulmonary Disease

## 2022-02-07 DIAGNOSIS — J9621 Acute and chronic respiratory failure with hypoxia: Secondary | ICD-10-CM | POA: Diagnosis not present

## 2022-02-07 DIAGNOSIS — J189 Pneumonia, unspecified organism: Secondary | ICD-10-CM | POA: Diagnosis not present

## 2022-02-07 DIAGNOSIS — J90 Pleural effusion, not elsewhere classified: Secondary | ICD-10-CM | POA: Diagnosis not present

## 2022-02-07 DIAGNOSIS — R918 Other nonspecific abnormal finding of lung field: Secondary | ICD-10-CM | POA: Diagnosis not present

## 2022-02-07 LAB — GLUCOSE, CAPILLARY
Glucose-Capillary: 122 mg/dL — ABNORMAL HIGH (ref 70–99)
Glucose-Capillary: 111 mg/dL — ABNORMAL HIGH (ref 70–99)
Glucose-Capillary: 109 mg/dL — ABNORMAL HIGH (ref 70–99)
Glucose-Capillary: 107 mg/dL — ABNORMAL HIGH (ref 70–99)

## 2022-02-07 LAB — BASIC METABOLIC PANEL
Anion gap: 7 (ref 5–15)
BUN: 18 mg/dL (ref 8–23)
CO2: 28 mmol/L (ref 22–32)
Calcium: 9.2 mg/dL (ref 8.9–10.3)
Chloride: 104 mmol/L (ref 98–111)
Creatinine, Ser: 1.19 mg/dL (ref 0.61–1.24)
GFR, Estimated: >60 mL/min (ref >60)
Glucose, Bld: 149 mg/dL — ABNORMAL HIGH (ref 70–99)
Potassium: 5 mmol/L (ref 3.5–5.1)
Sodium: 139 mmol/L (ref 135–145)

## 2022-02-07 LAB — GLUCOSE, PLEURAL OR PERITONEAL FLUID: Glucose, Fluid: 112 mg/dL

## 2022-02-07 LAB — CBC
HCT: 44.6 % (ref 39.0–52.0)
Hemoglobin: 13.8 g/dL (ref 13.0–17.0)
MCH: 28.5 pg (ref 26.0–34.0)
MCHC: 30.9 g/dL (ref 30.0–36.0)
MCV: 92.1 fL (ref 80.0–100.0)
Platelets: 232 10*3/uL (ref 150–400)
RBC: 4.84 MIL/uL (ref 4.22–5.81)
RDW: 16.3 % — ABNORMAL HIGH (ref 11.5–15.5)
WBC: 18.5 10*3/uL — ABNORMAL HIGH (ref 4.0–10.5)
nRBC: 0 % (ref 0.0–0.2)

## 2022-02-07 LAB — BODY FLUID CELL COUNT WITH DIFFERENTIAL
Eos, Fluid: 4 %
Lymphs, Fluid: 30 %
Monocyte-Macrophage-Serous Fluid: 33 %
Neutrophil Count, Fluid: 33 %
Total Nucleated Cell Count, Fluid: 2068 cu mm

## 2022-02-07 LAB — PROTEIN, PLEURAL OR PERITONEAL FLUID: Total protein, fluid: 3.8 g/dL

## 2022-02-07 LAB — LACTATE DEHYDROGENASE, PLEURAL OR PERITONEAL FLUID: LD, Fluid: 298 U/L — ABNORMAL HIGH (ref 3–23)

## 2022-02-07 LAB — MAGNESIUM: Magnesium: 2.5 mg/dL — ABNORMAL HIGH (ref 1.7–2.4)

## 2022-02-07 LAB — PROCALCITONIN: Procalcitonin: <0.1 ng/mL

## 2022-02-07 MED ORDER — SALINE SPRAY 0.65 % NA SOLN: 1.0000 | NASAL | Status: DC | PRN

## 2022-02-07 MED ORDER — IPRATROPIUM-ALBUTEROL 0.5-2.5 (3) MG/3ML IN SOLN
3.0000 mL | Freq: Three times a day (TID) | RESPIRATORY_TRACT | Status: DC
  Administered 2022-02-07 – 2022-02-14 (×20): 3 mL via RESPIRATORY_TRACT
  Filled 2022-02-07 (×21): qty 3

## 2022-02-07 MED ORDER — FLUTICASONE PROPIONATE 50 MCG/ACT NA SUSP
2.0000 | Freq: Every day | NASAL | Status: DC
  Administered 2022-02-07 – 2022-02-14 (×8): 2 via NASAL
  Filled 2022-02-07: qty 16

## 2022-02-07 NOTE — Plan of Care (Signed)

## 2022-02-07 NOTE — Telephone Encounter (Signed)
Spoke with Dr. Patsey Berthold, she states the patient cancelled the procedure scheduled for 10/6 (his granddaughter was getting married).  He was admitted on 02/05/22.  The patient was originally a Dr. Mortimer Fries patient.  Dr. Mortimer Fries saw over the weekend and stated to hold off on bx for now and do a thoracentesis for now.  Patient to have a thoracentesis as in patient for now.

## 2022-02-07 NOTE — Progress Notes (Signed)
Progress Note   Patient: Shawn Lindsey XNT:[id number] DOB: Aug 19, 1950 DOA: 02/05/2022     2 DOS: the patient was seen and examined on 02/07/2022   Brief hospital course: Mr. Syler Norcia is a 71 year old male with history of pulmonary lesion concerning for adenocarcinoma, tobacco use, COPD on 3 L/min O2 with sleep at baseline (followed by pulmonology), hypothyroid, depression, anxiety, neuropathy, hypertension, hyperlipidemia, who presented to the ED on 02/05/2022 for evaluation of progressively worsening shortness of breath.  In the ED, afebrile, RR 30, HR 70's-80's, stable BP, and hypoxic with spO2 of 90% on 6 L/min supplement O2.  Labs notable for creatinine of 1.43, WBC 13, BNP 262.3.  High sensitive troponin was 19.  Procalcitonin < 0.10.  CTA of the chest was read as negative for PE.   Findings consistent with multifocal pneumonia and possible neoplasm.  It showed  Large right pleural effusion, occluded RUL bronchus with continguous soft tissue felt to possibly reflect a mass 4 x 3.4 cm in size.  RML, RLL are collapsed.  Patchy airspace opacities RUL, LLL and LUL's, LLL 8 mm nodule.  Pt was admitted to medicine service, started on IV antibiotics for presumed community-acquired multifocal pneumonia.  Pulmonology consulted.  Patient is followed by [facility], primarily Dr. Patsey Berthold.  There is a CT-guided lung biopsy scheduled as outpatient for 10/12.    Assessment and Plan: * Acute on chronic respiratory failure with hypoxia (HCC) Met for sepsis with tachypnea, leukocytosis, source of multifocal pneumonia vs lung mass causing airway obstruction --Continue Rocephin, Zithromax - Scheduled DuoNebs Q4H --Solu-Medrol 40 mg IV BID --Pulmicort nebs BID --Pulmonology consulted, appreciate recommendations --Follow up MRSA PCR, sputum culture, respiratory to induce sputum --Respiratory viral panel pending --O2 supplementation, goal spO2 >88% - Incentive spirometry, flutter  valve  10/9: on 10 L/min HFNC  Pleural effusion on right Etiology TBD, but there is concern for recurrent lung cancer. 10/9: US thoracentesis, 1.5 L removed Pleural fluid LDH very high 298 --Pulmonology following --Follow up pleural fluid studies (cytology, cultures, acid fast smear)  Obesity, Class III, BMI 40-49.9 (morbid obesity) ([place]) - This complicates overall care and prognosis.   Back pain Acute on chronic, at least in part related to pneumonia, pleural effusion, ?musculoskeletal in setting of cough --Resumed home Norco - Lidocaine patch ordered - Acetaminophen --IV analgesics PRN for breakthrough pain --Mobilize  Multifocal pneumonia See acute respiratory failure.   History of Klebsiella pneumonia Follow sputum cultures  Type 2 diabetes mellitus with diabetic polyneuropathy, with long-term current use of insulin (HCC) Sliding scale Novolog Adjust insulin PRN  Mass of upper lobe of right lung Has outpatient biopsy scheduled for 02/11/2019.  Follows with pulmonology. They are consulted, see recommendations.  Hyperlipidemia - Atorvastatin 80 mg daily resumed  COPD (chronic obstructive pulmonary disease) ([place]) See respiratory failure  HTN (hypertension) - Hydralazine PRN        Subjective: Pt seen awake sitting up in bed, girlfriend/significant other at bedside.  He says he still feels unable to breathe, feels better at higher flow rate of oxygen. Reports right nasal congestion  Physical Exam: Vitals:   02/07/22 0734 02/07/22 1107 02/07/22 1240 02/07/22 1254  BP: (!) 140/77   119/63  Pulse: 85 85  84  Resp: 17   16  Temp: 98.3 F (36.8 C)   (!) 97.4 F (36.3 C)  TempSrc:      SpO2: 90% 94% 94% (!) 89%  Weight:      Height:  General exam: awake, alert, no acute distress, obese HEENT: HFNC in place, moist mucus membranes, hearing grossly normal  Respiratory system: rhonchi,  absent breath sounds on right, no expiratory wheezes, mildly  increased respiratory effort, on 10 >> 12 L/min HFNC O2. Cardiovascular system: normal S1/S2, RRR Gastrointestinal system: soft, NT, ND Central nervous system: A&O x4. no gross focal neurologic deficits, normal speech Extremities: lower extremity venous stasis, normal tone Skin: dry, intact, normal temperature Psychiatry: normal mood, congruent affect, judgement and insight appear normal   Data Reviewed:  Notable labs --  glucose 149, WBC 18.5 (on steroids), procal < 0.10  Family Communication: none  Disposition: Status is: Inpatient Remains inpatient appropriate because: on IV antibiotics, ongoing evaluation, remains on significant amount of supplemental oxygen.   Planned Discharge Destination: Home    Time spent: 45 minutes  Author: Ezekiel Slocumb, DO 02/07/2022 4:59 PM  For on call review [email or web address].

## 2022-02-07 NOTE — Procedures (Signed)
Ultrasound-guided diagnostic and therapeutic right sided thoracentesis performed yielding 1.5 liters of straw colored fluid. No immediate complications.   Diagnostic fluid was sent to the lab for further analysis. Follow-up chest x-ray pending. EBL is < 2 ml.

## 2022-02-08 ENCOUNTER — Inpatient Hospital Stay
Admit: 2022-02-08 | Discharge: 2022-02-08 | Disposition: A | Discharge status: Home / Self Care | Payer: PPO | Attending: Pulmonary Disease | Admitting: Pulmonary Disease

## 2022-02-08 ENCOUNTER — Inpatient Hospital Stay: Payer: PPO

## 2022-02-08 DIAGNOSIS — R4589 Other symptoms and signs involving emotional state: Secondary | ICD-10-CM | POA: Clinically undetermined

## 2022-02-08 DIAGNOSIS — F418 Other specified anxiety disorders: Secondary | ICD-10-CM | POA: Clinically undetermined

## 2022-02-08 DIAGNOSIS — J9621 Acute and chronic respiratory failure with hypoxia: Secondary | ICD-10-CM | POA: Diagnosis not present

## 2022-02-08 LAB — ECHOCARDIOGRAM COMPLETE
AR max vel: 2.86 cm2
AV Area VTI: 2.74 cm2
AV Area mean vel: 2.75 cm2
AV Mean grad: 7 mmHg
AV Peak grad: 12.5 mmHg
Ao pk vel: 1.77 m/s
Area-P 1/2: 3.5 cm2
Height: 70 in
S' Lateral: 3.9 cm
Weight: 4848 oz

## 2022-02-08 LAB — GLUCOSE, CAPILLARY
Glucose-Capillary: 116 mg/dL — ABNORMAL HIGH (ref 70–99)
Glucose-Capillary: 201 mg/dL — ABNORMAL HIGH (ref 70–99)
Glucose-Capillary: 118 mg/dL — ABNORMAL HIGH (ref 70–99)
Glucose-Capillary: 114 mg/dL — ABNORMAL HIGH (ref 70–99)

## 2022-02-08 LAB — BASIC METABOLIC PANEL
Anion gap: 10 (ref 5–15)
BUN: 25 mg/dL — ABNORMAL HIGH (ref 8–23)
CO2: 28 mmol/L (ref 22–32)
Calcium: 9.1 mg/dL (ref 8.9–10.3)
Chloride: 102 mmol/L (ref 98–111)
Creatinine, Ser: 1.14 mg/dL (ref 0.61–1.24)
GFR, Estimated: >60 mL/min (ref >60)
Glucose, Bld: 130 mg/dL — ABNORMAL HIGH (ref 70–99)
Potassium: 5 mmol/L (ref 3.5–5.1)
Sodium: 140 mmol/L (ref 135–145)

## 2022-02-08 LAB — CBC
HCT: 44 % (ref 39.0–52.0)
Hemoglobin: 13.6 g/dL (ref 13.0–17.0)
MCH: 28.5 pg (ref 26.0–34.0)
MCHC: 30.9 g/dL (ref 30.0–36.0)
MCV: 92.1 fL (ref 80.0–100.0)
Platelets: 223 10*3/uL (ref 150–400)
RBC: 4.78 MIL/uL (ref 4.22–5.81)
RDW: 16 % — ABNORMAL HIGH (ref 11.5–15.5)
WBC: 16.8 10*3/uL — ABNORMAL HIGH (ref 4.0–10.5)
nRBC: 0 % (ref 0.0–0.2)

## 2022-02-08 MED ORDER — ALPRAZOLAM 0.25 MG PO TABS
0.2500 mg | ORAL_TABLET | Freq: Two times a day (BID) | ORAL | Status: DC | PRN
  Administered 2022-02-08 – 2022-02-14 (×9): 0.25 mg via ORAL
  Filled 2022-02-08 (×9): qty 1

## 2022-02-08 NOTE — Care Management Important Message (Signed)
Important Message  Patient Details  Name: Shawn Lindsey MRN: [id number] Date of Birth: [birth year]   Medicare Important Message Given:  N/A - LOS <3 / Initial given by admissions     Dannette Barbara 02/08/2022, 8:37 AM

## 2022-02-08 NOTE — Progress Notes (Signed)
Progress Note   Patient: Shawn Lindsey IFO:[id number] DOB: [birth year] DOA: 02/05/2022     3 DOS: the patient was seen and examined on 02/08/2022   Brief hospital course: Mr. Cray Monnin is a 71 year old male with history of pulmonary lesion concerning for adenocarcinoma, tobacco use, COPD on 3 L/min O2 with sleep at baseline (followed by pulmonology), hypothyroid, depression, anxiety, neuropathy, hypertension, hyperlipidemia, who presented to the ED on 02/05/2022 for evaluation of progressively worsening shortness of breath.  In the ED, afebrile, RR 30, HR 70's-80's, stable BP, and hypoxic with spO2 of 90% on 6 L/min supplement O2.  Labs notable for creatinine of 1.43, WBC 13, BNP 262.3.  High sensitive troponin was 19.  Procalcitonin < 0.10.  CTA of the chest was read as negative for PE.   Findings consistent with multifocal pneumonia and possible neoplasm.  It showed  Large right pleural effusion, occluded RUL bronchus with continguous soft tissue felt to possibly reflect a mass 4 x 3.4 cm in size.  RML, RLL are collapsed.  Patchy airspace opacities RUL, LLL and LUL's, LLL 8 mm nodule.  Pt was admitted to medicine service, started on IV antibiotics for presumed community-acquired multifocal pneumonia.  Pulmonology consulted.  Patient is followed by [facility] Pulmonology, primarily Dr. Patsey Berthold.  He had a CT-guided lung biopsy scheduled as outpatient for 10/12.  That procedure is on hold given current respiratory status. Thoracentesis was performed and cytology pending.   Assessment and Plan: * Acute on chronic respiratory failure with hypoxia (HCC) Met for sepsis with tachypnea, leukocytosis, source of multifocal pneumonia vs lung mass causing airway obstruction --Continue Rocephin, Zithromax,  - Scheduled DuoNebs TID --Solu-Medrol 40 mg IV BID --Pulmicort nebs BID --Pulmonology consulted, appreciate recommendations --Follow up MRSA PCR, sputum culture, respiratory to induce  sputum --Respiratory viral panel pending --O2 supplementation, goal spO2 >88% - Incentive spirometry, flutter valve  10/9: on 10 L/min HFNC 10/10: placed on 45 L/min heated HFNC more for comfort that O2 sats, pt feels better on higher flow rate  Anxiety about health Lowest dose xanax PRN started. Seems tolerating well, monitor.  Pleural effusion on right Etiology TBD, but there is concern for recurrent lung cancer. 10/9: US thoracentesis, 1.5 L removed Pleural fluid LDH very high 298 --Pulmonology following --Follow up pleural fluid studies (cytology, cultures, acid fast smear)  Obesity, Class III, BMI 40-49.9 (morbid obesity) ([place]) - This complicates overall care and prognosis.   Back pain Acute on chronic, at least in part related to pneumonia, pleural effusion, ?musculoskeletal in setting of cough --Resumed home Norco - Lidocaine patch ordered - Acetaminophen --IV analgesics PRN for breakthrough pain --Mobilize  Multifocal pneumonia See acute respiratory failure.   History of Klebsiella pneumonia Follow sputum cultures  Type 2 diabetes mellitus with diabetic polyneuropathy, with long-term current use of insulin (HCC) Sliding scale Novolog Adjust insulin PRN  Mass of upper lobe of right lung Has outpatient biopsy scheduled for 02/11/2019.  Follows with pulmonology. They are consulted, see recommendations.  Hyperlipidemia - Atorvastatin 80 mg daily resumed  COPD (chronic obstructive pulmonary disease) ([place]) See respiratory failure  HTN (hypertension) - Hydralazine PRN        Subjective: Pt seen with girlfriend at bedside this morning.  He reports feeling a bit better, less SOB on the heated high-flow RT placed him on earlier this AM.  He requested something for anxiety earlier, given lowest dose xanax, he states can't tell if working, but girlfriend reports he's more calm now than earlier.  He denies other acute complaints except his breathing.  Physical  Exam: Vitals:   02/08/22 0952 02/08/22 1155 02/08/22 1438 02/08/22 1622  BP:  121/61  123/63  Pulse:  73 82 78  Resp: 15 19 16 19   Temp:  98.7 F (37.1 C)  98.7 F (37.1 C)  TempSrc:    Oral  SpO2:  97% 92% 96%  Weight:      Height:        General exam: awake, alert, no acute distress, obese HEENT: heated HFNC in place, moist mucus membranes, hearing grossly normal  Respiratory system: absent breath sounds on right, left side more clear today, no expiratory wheezes, increased respiratory effort, on 45 L/min heated HFNC O2. Cardiovascular system: normal S1/S2, RRR Gastrointestinal system: soft, NT, ND Central nervous system: A&O x4. no gross focal neurologic deficits, normal speech Extremities: no lower extremity edema, lower extremity venous stasis, normal tone Skin: dry, intact, normal temperature Psychiatry: normal mood, congruent affect, judgement and insight appear normal   Data Reviewed:  Notable labs --  glucose 130, BUN 25, WBC 16.8k on steroids   Echo today -- EF 01-02%, grade 1 diastolic dysfunction  Family Communication: significant other/girlfriend at bedside yesterday and today on rounds  Disposition: Status is: Inpatient Remains inpatient appropriate because: on IV antibiotics, ongoing evaluation, remains on significant amount of supplemental oxygen.   Planned Discharge Destination: Home    Time spent: 45 minutes  Author: Ezekiel Slocumb, DO 02/08/2022 6:32 PM  For on call review [email or web address].

## 2022-02-08 NOTE — Assessment & Plan Note (Signed)
Lowest dose xanax PRN started. Seems tolerating well, monitor.

## 2022-02-09 ENCOUNTER — Other Ambulatory Visit (HOSPITAL_COMMUNITY): Payer: Self-pay | Admitting: Student

## 2022-02-09 DIAGNOSIS — Z515 Encounter for palliative care: Secondary | ICD-10-CM | POA: Diagnosis not present

## 2022-02-09 DIAGNOSIS — Z7189 Other specified counseling: Secondary | ICD-10-CM

## 2022-02-09 DIAGNOSIS — R918 Other nonspecific abnormal finding of lung field: Secondary | ICD-10-CM

## 2022-02-09 DIAGNOSIS — J9621 Acute and chronic respiratory failure with hypoxia: Secondary | ICD-10-CM | POA: Diagnosis not present

## 2022-02-09 LAB — RESPIRATORY PANEL BY PCR

## 2022-02-09 LAB — GLUCOSE, CAPILLARY
Glucose-Capillary: 127 mg/dL — ABNORMAL HIGH (ref 70–99)
Glucose-Capillary: 146 mg/dL — ABNORMAL HIGH (ref 70–99)
Glucose-Capillary: 148 mg/dL — ABNORMAL HIGH (ref 70–99)
Glucose-Capillary: 162 mg/dL — ABNORMAL HIGH (ref 70–99)

## 2022-02-09 LAB — EXPECTORATED SPUTUM ASSESSMENT W GRAM STAIN, RFLX TO RESP C

## 2022-02-09 LAB — ACID FAST SMEAR (AFB, MYCOBACTERIA): Acid Fast Smear: NEGATIVE

## 2022-02-09 MED ORDER — HYDROCODONE-ACETAMINOPHEN 5-325 MG PO TABS
1.0000 | ORAL_TABLET | Freq: Two times a day (BID) | ORAL | Status: DC | PRN
  Administered 2022-02-09 – 2022-02-14 (×10): 1 via ORAL
  Filled 2022-02-09 (×10): qty 1

## 2022-02-09 NOTE — Evaluation (Addendum)
Occupational Therapy Evaluation Patient Details Name: Shawn Lindsey MRN: [id number] DOB: May 19, 1950 Today's Date: 02/09/2022   History of Present Illness Shawn Lindsey is a 41yoM who comes to [facility] on 02/05/22 [facility] [place] with SOB. PMH: tobacco use, COPD, hypoTSH, depression, GAD, diabetic neuropathy, HTN, HLD. RUL mass. POC includes saturation goal >92%. Workup showing ulmoary effusion, pt now s/p thoracentesis 1.5L 10/9.   Clinical Impression   Patient seen for evaluation, significant other present and both agreeable to OT. Patient presenting with decreased independence in self care, balance, functional mobility/transfers, and endurance. At baseline, pt's SO assists with LB dressing/bathing, cooking, transportation, and grocery shopping. Pt is normally independent with toileting and will occasionally assist with light IADL tasks. Pt currently functioning at supervision for bed mobility, Min A to stand from EOB, and Min guard to take steps at EOB using RW. Anticipate pt requires Max A for LB dressing and toileting tasks. Pt on HHHF throughout @ 45L/min, 40% FiO2, saturations >90% throughout session with activity. Patient will benefit from acute OT to increase overall independence in the areas of ADLs and functional mobility in order to safely discharge to next venue of care. Possible for pt to improve to [facility] pending progress while in this hospital, O2 requirements, and caregiver support at home.     Recommendations for follow up therapy are one component of a multi-disciplinary discharge planning process, led by the attending physician.  Recommendations may be updated based on patient status, additional functional criteria and insurance authorization.   Follow Up Recommendations  OT at Long-term acute care hospital (requiring HHHF)    Assistance Recommended at Discharge Set up Supervision/Assistance  Patient can return home with the following Assist for transportation;Help with stairs or ramp for  entrance;Assistance with cooking/housework;A little help with walking and/or transfers;A lot of help with bathing/dressing/bathroom    Functional Status Assessment  Patient has had a recent decline in their functional status and demonstrates the ability to make significant improvements in function in a reasonable and predictable amount of time.  Equipment Recommendations  None recommended by OT    Recommendations for Other Services       Precautions / Restrictions Precautions Precautions: Fall Restrictions Weight Bearing Restrictions: No      Mobility Bed Mobility Overal bed mobility: Needs Assistance Bed Mobility: Supine to Sit, Sit to Supine     Supine to sit: Supervision, HOB elevated Sit to supine: Supervision        Transfers Overall transfer level: Needs assistance Equipment used: Rolling walker (2 wheels) Transfers: Sit to/from Stand Sit to Stand: Min assist (from EOB)                  Balance Overall balance assessment: Modified Independent, Mild deficits observed, not formally tested                                         ADL either performed or assessed with clinical judgement   ADL Overall ADL's : Needs assistance/impaired     Grooming: Wash/dry face;Set up;Sitting       Lower Body Bathing: Maximal assistance;Sitting/lateral leans       Lower Body Dressing: Maximal assistance;Sitting/lateral leans   Toilet Transfer: Minimal assistance;Rolling walker (2 wheels) Toilet Transfer Details (indicate cue type and reason): simulated with STS from EOB Toileting- Clothing Manipulation and Hygiene: Maximal assistance;Sit to/from stand       Functional  mobility during ADLs: Min guard;Rolling walker (2 wheels) (to take 1 step forward/backward then ~2 laterally at EOB) General ADL Comments: girlfriend assists with LB dressing/bathing at baseline     Vision Baseline Vision/History: 1 Wears glasses (readers only) Patient Visual  Report: No change from baseline       Perception     Praxis      Pertinent Vitals/Pain Pain Assessment Pain Assessment: 0-10 Pain Score: 8  Pain Location: posterolatera RUL thorax Pain Intervention(s): Limited activity within patient's tolerance, Monitored during session, Premedicated before session     Hand Dominance Right   Extremity/Trunk Assessment Upper Extremity Assessment Upper Extremity Assessment: Generalized weakness   Lower Extremity Assessment Lower Extremity Assessment: Generalized weakness   Cervical / Trunk Assessment Cervical / Trunk Assessment: Kyphotic   Communication Communication Communication: No difficulties   Cognition Arousal/Alertness: Awake/alert Behavior During Therapy: WFL for tasks assessed/performed Overall Cognitive Status: Within Functional Limits for tasks assessed                                       General Comments  SpO2 maintained >90% on [place] @ 45L/min, 40% FiO2    Exercises Other Exercises Other Exercises: OT provided education re: role of OT, OT POC, post acute recs, sitting up for all meals, EOB/OOB mobility with assistance, home/fall safety.     Shoulder Instructions      Home Living Family/patient expects to be discharged to:: Private residence Living Arrangements: Spouse/significant other [name] (girlfriend of 18 years)) Available Help at Discharge: Family;Available PRN/intermittently (girlfriend works during the day) Type of Home: House Home Access: [place]: One level     Bathroom Shower/Tub: Sponge bathes at [place]: Conservation officer, nature (2 wheels);Grab bars - tub/shower;Wheelchair - manual;BSC/3in1 (wears 3L O2 [place] at night)          Prior Functioning/Environment Prior Level of Function : Needs assist       Physical Assist : ADLs (physical)   ADLs (physical): Dressing;IADLs;Bathing Mobility Comments: household AMB with RW, uses WC for in  to medical appointments; can perform transfers and bed mobility; denies falls ADLs Comments: Girlfriend assists with LB dressing/bathing, cooking, transportation, grocery shopping. Pt will stand at sink and help with dishes. IND toileting.        OT Problem List: Decreased strength;Decreased activity tolerance;Impaired balance (sitting and/or standing);Cardiopulmonary status limiting activity;Pain;Obesity      OT Treatment/Interventions: Self-care/ADL training;Therapeutic exercise;Patient/family education;Balance training;Energy conservation;Therapeutic activities;DME and/or AE instruction    OT Goals(Current goals can be found in the care plan section) Acute Rehab OT Goals Patient Stated Goal: get stronger OT Goal Formulation: With patient Time For Goal Achievement: 02/23/22 Potential to Achieve Goals: Good ADL Goals Pt Will Perform Grooming: standing;with supervision Pt Will Perform Upper Body Dressing: with supervision;sitting Pt Will Perform Lower Body Dressing: with min assist;with caregiver independent in assisting;with adaptive equipment;sitting/lateral leans;sit to/from stand Pt Will Transfer to Toilet: ambulating;regular height toilet;with supervision Pt Will Perform Toileting - Clothing Manipulation and hygiene: with min assist;with caregiver independent in assisting;with adaptive equipment;sit to/from stand  OT Frequency: Min 2X/week    Co-evaluation              AM-PAC OT "6 Clicks" Daily Activity     Outcome Measure Help from another person eating meals?: None Help from another person taking care of  personal grooming?: A Little Help from another person toileting, which includes using toliet, bedpan, or urinal?: A Lot Help from another person bathing (including washing, rinsing, drying)?: A Lot Help from another person to put on and taking off regular upper body clothing?: A Little Help from another person to put on and taking off regular lower body clothing?: A  Lot 6 Click Score: 16   End of Session Equipment Utilized During Treatment: Gait belt;Rolling walker (2 wheels) Nurse Communication: Mobility status  Activity Tolerance: Patient tolerated treatment well;Patient limited by fatigue Patient left: in bed;with call bell/phone within reach;with bed alarm set;with family/visitor present  OT Visit Diagnosis: Unsteadiness on feet (R26.81);Muscle weakness (generalized) (M62.81);Pain                Time: 7169-6789 OT Time Calculation (min): 22 min Charges:  OT General Charges $OT Visit: 1 Visit OT Evaluation $OT Eval Mod Complexity: 1 Mod  [facility] MS, OTR/L ascom [phone]  02/09/22, 7:14 PM

## 2022-02-09 NOTE — Plan of Care (Signed)

## 2022-02-09 NOTE — Progress Notes (Signed)
Progress Note   Patient: Shawn Lindsey OIB:[id number] DOB: Jan 22, 1951 DOA: 02/05/2022     4 DOS: the patient was seen and examined on 02/09/2022   Brief hospital course: 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   In the ED, afebrile, RR 30, HR 70's-80's, stable BP, and hypoxic with spO2 of 90% on 6 L/min supplement O2.  Labs notable for creatinine of 1.43, WBC 13, BNP 262.3.  High sensitive troponin was 19.  Procalcitonin < 0.10.   CTA of the chest was read as negative for PE.   Findings consistent with multifocal pneumonia and possible neoplasm.  It showed  Large right pleural effusion, occluded RUL bronchus with continguous soft tissue felt to possibly reflect a mass 4 x 3.4 cm in size.  RML, RLL are collapsed.  Patchy airspace opacities RUL, LLL and LUL's, LLL 8 mm nodule.   Pt was admitted to medicine service, started on IV antibiotics for presumed community-acquired multifocal pneumonia.  Pulmonology consulted.   Patient is followed by [facility] Pulmonology, primarily Dr. Patsey Lindsey.  He had a CT-guided lung biopsy scheduled as outpatient for 10/12.  That procedure is on hold given current respiratory status. Thoracentesis was performed and cytology pending.     Assessment and Plan: * Acute on chronic respiratory failure with hypoxia (HCC) Met for sepsis with tachypnea, leukocytosis, source of multifocal pneumonia vs lung mass causing airway obstruction --Continue Rocephin started 10/7, now s/p azighromycin - Scheduled DuoNebs TID --Solu-Medrol 40 mg IV BID --Pulmicort nebs BID --Pulmonology consulted, appreciate recommendations --Follow up MRSA PCR, sputum culture,  respiratory to induce sputum --O2 supplementation, goal spO2 >88% - Incentive spirometry, flutter valve -- on heated high flow today, mainly for comfort it seems - palliative consult for goals of care discussion, patient is open to this  Pleural effusion on right Etiology TBD, but there is concern for recurrent lung cancer. 10/9: US thoracentesis, 1.5 L removed Pleural fluid LDH very high 298 --Pulmonology following --Follow up pleural fluid studies (cytology, cultures, acid fast smear)  Obesity, Class III, BMI 40-49.9 (morbid obesity) ([place]) - This complicates overall care and prognosis.   Multifocal pneumonia Questionable, procal wnl. Treating for it  Type 2 diabetes mellitus with diabetic polyneuropathy, with long-term current use of insulin (HCC) Sliding scale Novolog Adjust insulin PRN  Mass of upper lobe of right lung Has outpatient biopsy scheduled for 02/11/2019.  Follows with pulmonology. They are consulted, see recommendations.  Hyperlipidemia - Atorvastatin 80 mg daily resumed  COPD (chronic obstructive pulmonary disease) ([place]) See respiratory failure  HTN (hypertension) - Hydralazine PRN      Subjective: Pt seen with girlfriend at bedside this morning.  Breathing improved with high flow  Physical Exam: Vitals:   02/08/22 2110 02/09/22 0341 02/09/22 0709 02/09/22 0819  BP:  (!) 130/56    Pulse:  64  83  Resp:  _0 Temp:  98.3 F (36.8 C)    TempSrc:  Oral    SpO2: 94%   98%  Weight:      Height:        General exam: awake, alert, no acute distress, obese HEENT: heated HFNC in place, moist mucus membranes, hearing grossly normal  Respiratory system: decreased breath sounds on right,  left few scattered wheeze,  speaking in complete sentences Cardiovascular system: normal S1/S2, RRR Gastrointestinal system: soft, NT, ND Central nervous system: A&O x4. no gross focal neurologic deficits, normal speech Extremities: no lower extremity edema,  lower extremity venous stasis, normal tone Skin: dry, intact, normal temperature Psychiatry: normal mood, congruent affect, judgement and insight appear normal    Family Communication: significant other/girlfriend at bedside today  Disposition: Status is: Inpatient Remains inpatient appropriate because: on IV antibiotics, ongoing evaluation, remains on significant amount of supplemental oxygen.   Planned Discharge Destination: TBD, will order pt/ot consults     Author: Desma Maxim, MD 02/09/2022 1:02 PM  For on call review [email or web address].

## 2022-02-09 NOTE — Plan of Care (Signed)
Problem: Education: Goal: Ability to describe self-care measures that may prevent or decrease complications (Diabetes Survival Skills Education) will improve 02/09/2022 0222 by Bonner Puna, RN Outcome: Progressing 02/09/2022 0221 by Bonner Puna, RN Outcome: Progressing Goal: Individualized Educational Video(s) 02/09/2022 0222 by Bonner Puna, RN Outcome: Progressing 02/09/2022 0221 by Bonner Puna, RN Outcome: Progressing   Problem: Coping: Goal: Ability to adjust to condition or change in health will improve 02/09/2022 0222 by Bonner Puna, RN Outcome: Progressing 02/09/2022 0221 by Bonner Puna, RN Outcome: Progressing   Problem: Fluid Volume: Goal: Ability to maintain a balanced intake and output will improve 02/09/2022 0222 by Bonner Puna, RN Outcome: Progressing 02/09/2022 0221 by Bonner Puna, RN Outcome: Progressing   Problem: Health Behavior/Discharge Planning: Goal: Ability to identify and utilize available resources and services will improve 02/09/2022 0222 by Bonner Puna, RN Outcome: Progressing 02/09/2022 0221 by Bonner Puna, RN Outcome: Progressing Goal: Ability to manage health-related needs will improve 02/09/2022 0222 by Bonner Puna, RN Outcome: Progressing 02/09/2022 0221 by Bonner Puna, RN Outcome: Progressing   Problem: Metabolic: Goal: Ability to maintain appropriate glucose levels will improve 02/09/2022 0222 by Bonner Puna, RN Outcome: Progressing 02/09/2022 0221 by Bonner Puna, RN Outcome: Progressing   Problem: Nutritional: Goal: Maintenance of adequate nutrition will improve 02/09/2022 0222 by Bonner Puna, RN Outcome: Progressing 02/09/2022 0221 by Bonner Puna, RN Outcome: Progressing Goal: Progress toward achieving an optimal weight will improve 02/09/2022 0222 by Bonner Puna, RN Outcome: Progressing 02/09/2022 0221 by Bonner Puna, RN Outcome: Progressing   Problem: Skin Integrity: Goal: Risk for impaired skin integrity will  decrease 02/09/2022 0222 by Bonner Puna, RN Outcome: Progressing 02/09/2022 0221 by Bonner Puna, RN Outcome: Progressing   Problem: Tissue Perfusion: Goal: Adequacy of tissue perfusion will improve 02/09/2022 0222 by Bonner Puna, RN Outcome: Progressing 02/09/2022 0221 by Bonner Puna, RN Outcome: Progressing   Problem: Education: Goal: Knowledge of General Education information will improve Description: Including pain rating scale, medication(s)/side effects and non-pharmacologic comfort measures 02/09/2022 0222 by Bonner Puna, RN Outcome: Progressing 02/09/2022 0221 by Bonner Puna, RN Outcome: Progressing   Problem: Health Behavior/Discharge Planning: Goal: Ability to manage health-related needs will improve 02/09/2022 0222 by Bonner Puna, RN Outcome: Progressing 02/09/2022 0221 by Bonner Puna, RN Outcome: Progressing   Problem: Clinical Measurements: Goal: Ability to maintain clinical measurements within normal limits will improve 02/09/2022 0222 by Bonner Puna, RN Outcome: Progressing 02/09/2022 0221 by Bonner Puna, RN Outcome: Progressing Goal: Will remain free from infection 02/09/2022 0222 by Bonner Puna, RN Outcome: Progressing 02/09/2022 0221 by Bonner Puna, RN Outcome: Progressing Goal: Diagnostic test results will improve 02/09/2022 0222 by Bonner Puna, RN Outcome: Progressing 02/09/2022 0221 by Bonner Puna, RN Outcome: Progressing Goal: Respiratory complications will improve 02/09/2022 0222 by Bonner Puna, RN Outcome: Progressing 02/09/2022 0221 by Bonner Puna, RN Outcome: Progressing Goal: Cardiovascular complication will be avoided 02/09/2022 0222 by Bonner Puna, RN Outcome: Progressing 02/09/2022 0221 by Bonner Puna, RN Outcome: Progressing   Problem: Activity: Goal: Risk for activity intolerance will decrease 02/09/2022 0222 by Bonner Puna, RN Outcome: Progressing 02/09/2022 0221 by Bonner Puna, RN Outcome: Progressing   Problem:  Nutrition: Goal: Adequate nutrition will be maintained 02/09/2022 0222 by Bonner Puna, RN Outcome: Progressing 02/09/2022 0221 by Bonner Puna, RN Outcome: Progressing   Problem: Coping: Goal: Level of anxiety will decrease 02/09/2022 0222 by Bonner Puna, RN Outcome: Progressing 02/09/2022 0221 by Bonner Puna, RN Outcome: Progressing   Problem: Elimination: Goal: Will not experience complications related to bowel motility 02/09/2022 0222  by Bonner Puna, RN Outcome: Progressing 02/09/2022 0221 by Bonner Puna, RN Outcome: Progressing Goal: Will not experience complications related to urinary retention 02/09/2022 0222 by Bonner Puna, RN Outcome: Progressing 02/09/2022 0221 by Bonner Puna, RN Outcome: Progressing   Problem: Pain Managment: Goal: General experience of comfort will improve 02/09/2022 0222 by Bonner Puna, RN Outcome: Progressing 02/09/2022 0221 by Bonner Puna, RN Outcome: Progressing   Problem: Safety: Goal: Ability to remain free from injury will improve 02/09/2022 0222 by Bonner Puna, RN Outcome: Progressing 02/09/2022 0221 by Bonner Puna, RN Outcome: Progressing   Problem: Skin Integrity: Goal: Risk for impaired skin integrity will decrease 02/09/2022 0222 by Bonner Puna, RN Outcome: Progressing 02/09/2022 0221 by Bonner Puna, RN Outcome: Progressing

## 2022-02-09 NOTE — Evaluation (Signed)
Physical Therapy Evaluation Patient Details Name: Shawn Lindsey MRN: [id number] DOB: 25-Dec-1950 Today's Date: 02/09/2022  History of Present Illness  Lum Stillinger is a 37yoM who comes to [facility] on 02/05/22 from [facility] with SOB. PMH: tobacco use, COPD, hypoTSH, depression, GAD, diabetic neuropathy, HTN, HLD. RUL mass. POC includes saturation goal >92%. Workup showing ulmoary effusion, pt now s/p thoracentesis 1.5L 10/9.  Clinical Impression  Pt in bed on entry, Rt lung pain still quite elevated, but he remains calm, pleasant, motivated to participate in assessment. Remains on HHHF throughout @ 45L/min, 40% FiO2, saturations 95% throughout session even post exertion. Pt weak with attempts to come to standing, requires minA, but can stand 8x from elevated EOB c RW. No physical assist needed for supine to EOB. Pt heavily dependent on BUE on RW for marching in place, not ready to commence walking assessment today. Tolerates ~28 minutes of exercise in session, then left up in chair with SO and PMT in room. Pt not moving well enough for safe return to home environment just yet, but if he is able to return to household distance AMB and able to be on an O2 flowrate that can be met at home, he may eventually be able to DC straight to home.      Recommendations for follow up therapy are one component of a multi-disciplinary discharge planning process, led by the attending physician.  Recommendations may be updated based on patient status, additional functional criteria and insurance authorization.  Follow Up Recommendations PT at Long-term acute care hospital (requiring HHHF)      Assistance Recommended at Discharge Set up Supervision/Assistance  Patient can return home with the following  A lot of help with walking and/or transfers;A lot of help with bathing/dressing/bathroom;Assist for transportation;Help with stairs or ramp for entrance    Equipment Recommendations    Recommendations for Other Services        Functional Status Assessment Patient has had a recent decline in their functional status and demonstrates the ability to make significant improvements in function in a reasonable and predictable amount of time.     Precautions / Restrictions Precautions Precautions: Fall Restrictions Weight Bearing Restrictions: No      Mobility  Bed Mobility Overal bed mobility: Needs Assistance Bed Mobility: Supine to Sit, Sit to Supine     Supine to sit: Supervision Sit to supine: Supervision   General bed mobility comments: mod effort, adequate strength    Transfers Overall transfer level: Needs assistance Equipment used: Rolling walker (2 wheels) Transfers: Sit to/from Stand, Bed to chair/wheelchair/BSC Sit to Stand: Min assist Stand pivot transfers: Supervision         General transfer comment: able to perform from elevated surface at supervision level    Ambulation/Gait Ambulation/Gait assistance:  (deferred to next date due to heavy BUE reliance on RW for stationary stepping)                Stairs            Wheelchair Mobility    Modified Rankin (Stroke Patients Only)       Balance Overall balance assessment: Modified Independent, Mild deficits observed, not formally tested                                           Pertinent Vitals/Pain Pain Assessment Pain Assessment: 0-10 Pain Score: 8  Pain Location:  posterolatera RUL thorax Pain Intervention(s): Limited activity within patient's tolerance, Monitored during session, Premedicated before session    [place] expects to be discharged to:: Private residence Living Arrangements: Spouse/significant other ([name]  (girlfriend of 18 years)) Available Help at Discharge: Family;Available PRN/intermittently (wife works during the day) Type of Home: House Home Access: [place]: [place]: Conservation officer, nature (2 wheels);Grab  bars - tub/shower;Wheelchair - manual (wears 3L O2 [place] at night.)      Prior Function               Mobility Comments: household AMB with RW, uses  WC for in to medical appointments; can perform transfers and bed mobility; ADLs Comments: SO helps with LBD, LBB     Hand Dominance   Dominant Hand: Right    Extremity/Trunk Assessment   Upper Extremity Assessment Upper Extremity Assessment: Generalized weakness;Overall [facility] for tasks assessed    Lower Extremity Assessment Lower Extremity Assessment: Generalized weakness;Overall [facility] for tasks assessed    Cervical / Trunk Assessment Cervical / Trunk Assessment: Kyphotic  Communication   Communication: No difficulties  Cognition Arousal/Alertness: Awake/alert Behavior During Therapy: WFL for tasks assessed/performed Overall Cognitive Status: Within Functional Limits for tasks assessed                                          General Comments      Exercises General Exercises - Lower Extremity Heel Slides: AROM, Both Straight Leg Raises: AROM, Both Other Exercises Other Exercises: STS from elevated EOB 2x3 c RW Other Exercises: Stationary marching c RW support 2x12 Other Exercises: sustained standing c RW, cues for postural extension   Assessment/Plan    PT Assessment Patient needs continued PT services  PT Problem List Decreased strength;Decreased range of motion;Decreased activity tolerance;Decreased balance;Decreased mobility;Decreased safety awareness       PT Treatment Interventions DME instruction;Balance training;Gait training;Stair training;Functional mobility training;Therapeutic activities;Therapeutic exercise;Patient/family education;Wheelchair mobility training    PT Goals (Current goals can be found in the Care Plan section)  Acute Rehab PT Goals Patient Stated Goal: regain strength for return to come, have greater confidence in balance ability PT Goal Formulation: With patient Time  For Goal Achievement: 02/23/22 Potential to Achieve Goals: Good    Frequency Min 2X/week     Co-evaluation               AM-PAC PT "6 Clicks" Mobility  Outcome Measure Help needed turning from your back to your side while in a flat bed without using bedrails?: A Little Help needed moving from lying on your back to sitting on the side of a flat bed without using bedrails?: A Little Help needed moving to and from a bed to a chair (including a wheelchair)?: A Little Help needed standing up from a chair using your arms (e.g., wheelchair or bedside chair)?: A Little Help needed to walk in hospital room?: A Lot Help needed climbing 3-5 steps with a railing? : A Lot 6 Click Score: 16    End of Session Equipment Utilized During Treatment: Oxygen Activity Tolerance: Patient tolerated treatment well;No increased pain;Patient limited by pain Patient left: in chair;with family/visitor present;with call bell/phone within reach;with chair alarm set;with nursing/sitter in room Nurse Communication: Mobility status PT Visit Diagnosis: Difficulty in walking, not elsewhere classified (R26.2);Unsteadiness on feet (R26.81);Other abnormalities of gait and  mobility (R26.89);Muscle weakness (generalized) (M62.81)    Time: 7871-8367 PT Time Calculation (min) (ACUTE ONLY): 44 min   Charges:   PT Evaluation $PT Eval Moderate Complexity: 1 Mod PT Treatments $Therapeutic Exercise: 23-37 mins       3:58 PM, 02/09/22 Etta Grandchild, PT, DPT Physical Therapist - [facility]  [phone] ([place])    Geet Hosking C 02/09/2022, 3:53 PM

## 2022-02-09 NOTE — Consult Note (Signed)
Consultation Note Date: 02/09/2022   Patient Name: Shawn Lindsey  DOB: Dec 01, 1950  MRN: [id number]  Age / Sex: 71 y.o., male  PCP: Leonel Ramsay, MD Referring Physician: Gwynne Edinger, MD  Reason for Consultation: Establishing goals of care  HPI/Patient Profile: 71 y.o. 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 Concern for multifocal pna vs lung mass causing airway obstruction. Patient now on [facility] for comfort. Patient also with pleural effusions s/p thoracentesis on 10/9 - awaiting pleural fluid studies. Patient was scheduled for outpatient biopsy 10/12 however this has been put on hold d/t respiratory status. PMT consulted to discuss [place].   Clinical Assessment and Goals of Care: I have reviewed medical records including EPIC notes, labs and imaging, assessed the patient and then met with patient and his sig other Shawn Lindsey  to discuss diagnosis prognosis, GOC, EOL wishes, disposition and options.  I introduced Palliative Medicine as specialized medical care for people living with serious illness. It focuses on providing relief from the symptoms and stress of a serious illness. The goal is to improve quality of life for both the patient and the family.  We discussed a brief life review of the patient. Patient served as Curator for 27 years. He has been with his sig other Shawn Lindsey for 18 years. He has 2 sons - one in [place] and one local.   As far as functional and nutritional status he tells me he was doing well prior to his pneumonia diagnosis. He wore O2 for naps and sleeping at night but otherwise did not need it. He was able to care for himself  independently. He had a good appetite.    We discussed patient's current illness and what it means in the larger context of patient's on-going co-morbidities.  Natural disease trajectory and expectations at EOL were discussed. We discussed concern for malignancy and compromised respiratory status.  I attempted to elicit values and goals of care important to the patient.    The difference between aggressive medical intervention and comfort care was considered in light of the patient's goals of care.   Patient tells me he has been significantly ill in the past - was comatose, on ventilator for months and required a trach. He is familiar with aggressive medical care. 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   Advance directives, concepts specific to code status, artificial feeding and hydration, and rehospitalization were considered and discussed.  Patient tells me if he is diagnosed with cancer he is certainly open to treatment options. He also tells me his desire to remain full code - he is open to mechanical ventilation and open to CPR if he arrests.   Discussed with patient and sig other the importance of continued conversation with family and the medical providers regarding overall plan of care and treatment options, ensuring decisions are within the context of the patient's values and GOCs.    We discuss decision maker if patient unable - he tells me he would want his sig other Shawn Lindsey and his sons to make decisions together. He would like to name Shawn Lindsey as HCPOA. We discussed attempting to get this document completed while he is in the hospital.   Questions and concerns were addressed. The family  was encouraged to call with questions or concerns.   Primary Decision Maker PATIENT    SUMMARY OF RECOMMENDATIONS   At this point patient hopeful for clearer answers on his diagnosis prior to making further decisions He would like to remain full  code at this time If he is diagnosed with cancer he is open to treatments offered He would like to name his sig other Shawn Lindsey as HCPOA - have placed consult to complete document  Code Status/Advance Care Planning: Full code     Primary Diagnoses: Present on Admission:  Acute on chronic respiratory failure with hypoxia (HCC)  HTN (hypertension)  Hyperlipidemia  Mass of upper lobe of right lung  Multifocal pneumonia  Back pain  COPD (chronic obstructive pulmonary disease) (HCC)  Obesity, Class III, BMI 40-49.9 (morbid obesity) ([place])  Pleural effusion on right   I have reviewed the medical record, interviewed the patient and family, and examined the patient. The following aspects are pertinent.  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   COPD (chronic obstructive pulmonary disease) (HCC)    Diabetes mellitus without complication (HCC)    controlled with diet   Elevated cholesterol    GERD (gastroesophageal reflux disease)    History of kidney stones    Hypertension    Hypothyroidism    Morbid obesity with BMI of 40.0-44.9, adult (HCC)    Pneumonia    klebsiella pneumoniae; extended stay in hospital requiring tracheotomy and gastrotomy   Seizures ([place]) 2015   related to sepsis   Septic shock ([place])    Sleep apnea 2019   has a Cpap machime    Stroke ([place]) 2007   no peripheral vision on left side of both eyes & left arm gets numb under stress   Social History   Socioeconomic History   Marital status: Divorced    Spouse name: Not on file   Number of children: 2   Years of education: Not on file   Highest education level: Not on file  Occupational History   Not on file  Tobacco Use   Smoking status: Every Day    Packs/day: 1.50    Years: 40.00    Total pack years: 60.00    Types: Cigarettes   Smokeless tobacco: Never   Tobacco comments:    1ppd - 12/23/2021  not trying to quit.  12/23/2021 hfb  Vaping Use   Vaping  Use: Never used  Substance and Sexual Activity   Alcohol use: No   Drug use: No   Sexual activity: Not on file  Other Topics Concern   Not on file  Social History Narrative   Lives with long time girlfriend   Social Determinants of Health   Financial Resource Strain: Not on file  Food Insecurity: No Food Insecurity (02/06/2022)   Hunger Vital Sign    Worried About Running Out of Food in the Last Year: Never true    Ran Out of Food in the Last Year: Never true  Transportation Needs: No Transportation Needs (02/06/2022)   PRAPARE - Hydrologist (Medical): No    Lack of Transportation (Non-Medical): No  Physical Activity: Not on file  Stress: Not on file  Social Connections: Not on file   Family History  Problem Relation Age of Onset   Diabetes Brother    Dementia Mother    Scheduled Meds:  atorvastatin  80 mg  Oral q1800   budesonide (PULMICORT) nebulizer solution  0.5 mg Nebulization BID   dextromethorphan-guaiFENesin  1 tablet Oral BID   enoxaparin (LOVENOX) injection  0.5 mg/kg Subcutaneous Q24H   FLUoxetine  10 mg Oral QPM   fluticasone  2 spray Each Nare Daily   gabapentin  600 mg Oral BID   insulin aspart  0-5 Units Subcutaneous QHS   insulin aspart  0-9 Units Subcutaneous TID WC   ipratropium-albuterol  3 mL Nebulization TID   levETIRAcetam  500 mg Oral BID   levothyroxine  112 mcg Oral Q0600   methylPREDNISolone (SOLU-MEDROL) injection  40 mg Intravenous Q12H   multivitamin with minerals  1 tablet Oral Daily   zolpidem  5 mg Oral QHS   Continuous Infusions: PRN Meds:.acetaminophen **OR** acetaminophen, ALPRAZolam, fentaNYL (SUBLIMAZE) injection, hydrALAZINE, HYDROcodone-acetaminophen, ondansetron **OR** ondansetron (ZOFRAN) IV, senna-docusate, sodium chloride No Known Allergies Review of Systems  Constitutional:  Positive for activity change.  Respiratory:  Positive for shortness of breath.   Musculoskeletal:  Positive for back pain.     Physical Exam Constitutional:      General: He is not in acute distress.    Appearance: He is ill-appearing.  Pulmonary:     Effort: Pulmonary effort is normal.     Comments: Remains on HHFNC Skin:    General: Skin is warm and dry.  Neurological:     Mental Status: He is alert and oriented to person, place, and time.  Psychiatric:        Mood and Affect: Mood normal.        Behavior: Behavior normal.     Vital Signs: BP (!) 130/56 (BP Location: Left Arm)   Pulse 82   Temp 98.3 F (36.8 C) (Oral)   Resp 14   Ht 5' 10"  (1.778 m)   Wt (!) 137.4 kg   SpO2 98%   BMI 43.48 kg/m  Pain Scale: 0-10   Pain Score: 8    SpO2: SpO2: 98 % O2 Device:SpO2: 98 % O2 Flow Rate: .O2 Flow Rate (L/min): 45 L/min  IO: Intake/output summary:  Intake/Output Summary (Last 24 hours) at 02/09/2022 1510 Last data filed at 02/09/2022 0436 Gross per 24 hour  Intake --  Output 1600 ml  Net -1600 ml    LBM:   Baseline Weight: Weight: (!) 137.4 kg Most recent weight: Weight: (!) 137.4 kg     Palliative Assessment/Data: PPS 50%     *Please note that this is a verbal dictation therefore any spelling or grammatical errors are due to the "[employer]" system interpretation.   Juel Burrow, DNP, AGNP-C Palliative Medicine Team [phone] Pager: [phone]

## 2022-02-10 ENCOUNTER — Ambulatory Visit
Admission: RE | Admit: 2022-02-10 | Discharge: 2022-02-10 | Disposition: A | Discharge status: Home / Self Care | Payer: PPO | Source: Ambulatory Visit | Attending: Pulmonary Disease | Admitting: Pulmonary Disease

## 2022-02-10 DIAGNOSIS — G40909 Epilepsy, unspecified, not intractable, without status epilepticus: Secondary | ICD-10-CM

## 2022-02-10 DIAGNOSIS — Z515 Encounter for palliative care: Secondary | ICD-10-CM | POA: Diagnosis not present

## 2022-02-10 DIAGNOSIS — Z7189 Other specified counseling: Secondary | ICD-10-CM | POA: Diagnosis not present

## 2022-02-10 DIAGNOSIS — J9621 Acute and chronic respiratory failure with hypoxia: Secondary | ICD-10-CM | POA: Diagnosis not present

## 2022-02-10 LAB — BASIC METABOLIC PANEL
Anion gap: 9 (ref 5–15)
BUN: 29 mg/dL — ABNORMAL HIGH (ref 8–23)
CO2: 29 mmol/L (ref 22–32)
Calcium: 9 mg/dL (ref 8.9–10.3)
Chloride: 102 mmol/L (ref 98–111)
Creatinine, Ser: 0.92 mg/dL (ref 0.61–1.24)
GFR, Estimated: >60 mL/min (ref >60)
Glucose, Bld: 165 mg/dL — ABNORMAL HIGH (ref 70–99)
Potassium: 4.4 mmol/L (ref 3.5–5.1)
Sodium: 140 mmol/L (ref 135–145)

## 2022-02-10 LAB — CULTURE, BLOOD (ROUTINE X 2)
Culture: NO GROWTH
Culture: NO GROWTH

## 2022-02-10 LAB — CBC
HCT: 46.2 % (ref 39.0–52.0)
Hemoglobin: 14.8 g/dL (ref 13.0–17.0)
MCH: 28.7 pg (ref 26.0–34.0)
MCHC: 32 g/dL (ref 30.0–36.0)
MCV: 89.5 fL (ref 80.0–100.0)
Platelets: 206 10*3/uL (ref 150–400)
RBC: 5.16 MIL/uL (ref 4.22–5.81)
RDW: 14.9 % (ref 11.5–15.5)
WBC: 14.3 10*3/uL — ABNORMAL HIGH (ref 4.0–10.5)
nRBC: 0 % (ref 0.0–0.2)

## 2022-02-10 LAB — GLUCOSE, CAPILLARY
Glucose-Capillary: 130 mg/dL — ABNORMAL HIGH (ref 70–99)
Glucose-Capillary: 152 mg/dL — ABNORMAL HIGH (ref 70–99)
Glucose-Capillary: 132 mg/dL — ABNORMAL HIGH (ref 70–99)
Glucose-Capillary: 245 mg/dL — ABNORMAL HIGH (ref 70–99)

## 2022-02-10 LAB — MAGNESIUM: Magnesium: 2.1 mg/dL (ref 1.7–2.4)

## 2022-02-10 LAB — PHOSPHORUS: Phosphorus: 3.3 mg/dL (ref 2.5–4.6)

## 2022-02-10 MED ORDER — LIDOCAINE 5 % EX PTCH
1.0000 | MEDICATED_PATCH | CUTANEOUS | Status: DC
  Administered 2022-02-10 – 2022-02-14 (×5): 1 via TRANSDERMAL
  Filled 2022-02-10 (×6): qty 1

## 2022-02-10 MED ORDER — PREDNISONE 20 MG PO TABS
40.0000 mg | ORAL_TABLET | Freq: Every day | ORAL | Status: DC
  Administered 2022-02-11: 40 mg via ORAL
  Filled 2022-02-10: qty 2

## 2022-02-10 NOTE — Progress Notes (Signed)
   02/10/22 1300  Clinical Encounter Type  Visited With Patient and family together  Visit Type Initial  Referral From Nurse  Consult/Referral To [place] responded to nurse consult for AD. Chaplain discussed AD with patient and significant other. Chaplain services are available for follow up as needed.

## 2022-02-10 NOTE — Progress Notes (Signed)
Physical Therapy Treatment Patient Details Name: Shawn Lindsey MRN: [id number] DOB: Apr 03, 1951 Today's Date: 02/10/2022   History of Present Illness Shawn Lindsey is a 55yoM who comes to [facility] on 02/[facility] [place] with SOB. PMH: tobacco use, COPD, hypoTSH, depression, GAD, diabetic neuropathy, HTN, HLD. RUL mass. POC includes saturation goal >92%. Workup showing ulmoary effusion, pt now s/p thoracentesis 1.5L 10/9.    PT Comments    Pt in bed on entry, SO at bedside. Rt thoracic pain similar to prior day. Reports tolerated up to chair yesterday fairly well. Pt motivated to continue progression in strength. HHHF down from 45L to 40L today. Able to perform STS 14x in session, and 2 bouts of 74ft AMB. Pt set up in chair for lunch. Encouraged to consider up to chair twice today (again for dinner time). NSG team made aware of pt's updated mobility status.     Recommendations for follow up therapy are one component of a multi-disciplinary discharge planning process, led by the attending physician.  Recommendations may be updated based on patient status, additional functional criteria and insurance authorization.  Follow Up Recommendations  PT at Long-term acute care hospital     Assistance Recommended at Discharge Set up Supervision/Assistance  Patient can return home with the following A lot of help with walking and/or transfers;A lot of help with bathing/dressing/bathroom;Assist for transportation;Help with stairs or ramp for entrance   Equipment Recommendations  None recommended by PT    Recommendations for Other Services       Precautions / Restrictions Precautions Precautions: Fall     Mobility  Bed Mobility Overal bed mobility: Needs Assistance Bed Mobility: Supine to Sit     Supine to sit: Modified independent (Device/Increase time)     General bed mobility comments: appears improved since yesterday    Transfers Overall transfer level: Needs assistance Equipment used:  Rolling walker (2 wheels) Transfers: Sit to/from Stand Sit to Stand: From elevated surface, Supervision                Ambulation/Gait Ambulation/Gait assistance: Min guard, Supervision Gait Distance (Feet): 30 Feet           General Gait Details: alternate 13ft fwd, 25ft back x5 over 2 reps, mild increased dyspnea, Sats >95%.   Stairs             Wheelchair Mobility    Modified Rankin (Stroke Patients Only)       Balance                                            Cognition Arousal/Alertness: Awake/alert Behavior During Therapy: WFL for tasks assessed/performed Overall Cognitive Status: Within Functional Limits for tasks assessed                                          Exercises Other Exercises Other Exercises: 4xSTS from elevated (3 sets) Other Exercises: 2 walking bouts, 38ft each c RW, fwd/backward; 40L/min sats >95%    General Comments        Pertinent Vitals/Pain Pain Assessment Pain Score: 8  Pain Location: posterolatera RUL/thorax Pain Intervention(s): Limited activity within patient's tolerance, Monitored during session    Home Living  Prior Function            PT Goals (current goals can now be found in the care plan section) Acute Rehab PT Goals Patient Stated Goal: regain strength for return to come, have greater confidence in balance ability PT Goal Formulation: With patient Time For Goal Achievement: 02/23/22 Potential to Achieve Goals: Good Progress towards PT goals: Progressing toward goals    Frequency    Min 2X/week      PT Plan Current plan remains appropriate    Co-evaluation              AM-PAC PT "6 Clicks" Mobility   Outcome Measure  Help needed turning from your back to your side while in a flat bed without using bedrails?: A Little Help needed moving from lying on your back to sitting on the side of a flat bed without using  bedrails?: A Little Help needed moving to and from a bed to a chair (including a wheelchair)?: A Little Help needed standing up from a chair using your arms (e.g., wheelchair or bedside chair)?: A Little Help needed to walk in hospital room?: A Lot Help needed climbing 3-5 steps with a railing? : A Lot 6 Click Score: 16    End of Session Equipment Utilized During Treatment: Oxygen Activity Tolerance: Patient tolerated treatment well;No increased pain;Patient limited by pain Patient left: in chair;with family/visitor present;with call bell/phone within reach;with chair alarm set;with nursing/sitter in room Nurse Communication: Mobility status PT Visit Diagnosis: Difficulty in walking, not elsewhere classified (R26.2);Unsteadiness on feet (R26.81);Other abnormalities of gait and mobility (R26.89);Muscle weakness (generalized) (M62.81)     Time: 1122-1201 PT Time Calculation (min) (ACUTE ONLY): 39 min  Charges:  $Therapeutic Exercise: 38-52 mins                    1:35 PM, 02/10/22 Etta Grandchild, PT, DPT Physical Therapist - [facility]  [phone] ([place])    [name] C 02/10/2022, 1:31 PM

## 2022-02-10 NOTE — TOC Initial Note (Signed)
Transition of Care [facility]) - Initial/Assessment Note    Patient Details  Name: Shawn Lindsey MRN: [id number] Date of Birth: [birth year]  Transition of Care [facility]) CM/SW Contact:    Alberteen Sam, LCSW Phone Number: 02/10/2022, 3:05 PM  Clinical Narrative:                  CSW attempted to meet patient in room, patient was receiving patient care.   CSW called patient later,provided patient with information 2 LTAC options which PT is recommending. He reports he would rather go to STR at [facility]. CSW explained he will need to be at 5L O2 or below to go to [facility], spoke with RT who reports they will work on this as their goal. TOC will follow up tomorrow to see if patient has achieved this goal in order to look at SNFs, if not will need to move forward with LTAC options   Treatment team updated.   Expected Discharge Plan:  (TBD)     Patient Goals and CMS Choice Patient states their goals for this hospitalization and ongoing recovery are:: to go home CMS Medicare.gov Compare Post Acute Care list provided to:: Patient Choice offered to / list presented to : Patient  Expected Discharge Plan and Services Expected Discharge Plan:  (TBD)                                              Prior Living Arrangements/Services                       Activities of Daily Living Home Assistive Devices/Equipment: Gilford Rile (specify type) ADL Screening (condition at time of admission) Patient's cognitive ability adequate to safely complete daily activities?: Yes Is the patient deaf or have difficulty hearing?: No Does the patient have difficulty seeing, even when wearing glasses/contacts?: No Does the patient have difficulty concentrating, remembering, or making decisions?: No Patient able to express need for assistance with ADLs?: Yes Does the patient have difficulty dressing or bathing?: Yes Independently performs ADLs?: Yes (appropriate for developmental age) Does the patient have  difficulty walking or climbing stairs?: Yes Weakness of Legs: Both Weakness of Arms/Hands: None  Permission Sought/Granted                  Emotional Assessment       Orientation: : Oriented to Self, Oriented to Place, Oriented to  Time, Oriented to Situation Alcohol / Substance Use: Not Applicable Psych Involvement: No (comment)  Admission diagnosis:  Acute respiratory failure with hypoxia ([place]) [J96.01] Acute on chronic respiratory failure with hypoxia (HCC) [J96.21] Patient Active Problem List   Diagnosis Date Noted   Seizure disorder ([place]) 02/10/2022   Anxiety about health 02/08/2022   Pleural effusion on right 02/06/2022   Acute on chronic respiratory failure with hypoxia ([place]) 02/05/2022   History of Klebsiella pneumonia 02/05/2022   Multifocal pneumonia 02/05/2022   Back pain 02/05/2022   Obesity, Class III, BMI 40-49.9 (morbid obesity) ([place]) 02/05/2022   Type 2 diabetes mellitus with diabetic polyneuropathy, with long-term current use of insulin ([place])    Nephrolithiasis    Ureterolithiasis    Proteus mirabilis infection    Pneumonia of right upper lobe due to Klebsiella pneumoniae ([place])    Acute renal failure with tubular necrosis ([place])    Acute respiratory failure with hypoxia ([place])  Septic shock ([place]) 07/05/2019   Mass of upper lobe of right lung 09/29/2015   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 [place] [address] Phone: [phone] Fax: [phone]     Social Determinants of Health (SDOH) Interventions    Readmission Risk Interventions     No data to display

## 2022-02-10 NOTE — Progress Notes (Signed)
Palliative: [name], Shawn Lindsey, is resting quietly in bed.  He greets me, making and mostly keeping eye contact.  He is alert and oriented x3, able to make his basic needs known.  There is no family at bedside at this time although bedside nursing staff is present attending to needs.  We talk about test results.  Shawn Lindsey shares that he is awaiting test results.  We talk about HCPOA.  Shawn Lindsey shares that he has not yet completed this paperwork with chaplain service naming his significant other of 18 years, Shawn Lindsey, as his healthcare surrogate.  No questions or concerns at this time.  Conference with attending, bedside nursing staff, transition of care team related to patient condition, needs, goals of care.  Plan: At this point continue full scope/full code.  Time for outcomes.  Awaiting test results.  25 minutes Shawn Axe, NP Palliative medicine team Team phone [phone] Greater than 50% of this time was spent counseling and coordinating care related to the above assessment and plan.

## 2022-02-10 NOTE — Progress Notes (Signed)
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   In the ED, afebrile, RR 30, HR 70's-80's, stable BP, and hypoxic with spO2 of 90% on 6 L/min supplement O2.  Labs notable for creatinine of 1.43, WBC 13, BNP 262.3.  High sensitive troponin was 19.  Procalcitonin < 0.10.   CTA of the chest was read as negative for PE.   Findings consistent with multifocal pneumonia and possible neoplasm.  It showed  Large right pleural effusion, occluded RUL bronchus with continguous soft tissue felt to possibly reflect a mass 4 x 3.4 cm in size.  RML, RLL are collapsed.  Patchy airspace opacities RUL, LLL and LUL's, LLL 8 mm nodule.   Pt was admitted to medicine service, started on IV antibiotics for presumed community-acquired multifocal pneumonia.  Pulmonology consulted.   Patient is followed by [facility] Pulmonology, primarily Dr. Patsey Berthold.  He had a CT-guided lung biopsy scheduled as outpatient for 10/12.  That procedure is on hold given current respiratory status. Thoracentesis was performed and cytology pending.     Assessment and Plan: * Acute on chronic respiratory failure with hypoxia (HCC) Met for sepsis with tachypnea, leukocytosis, source of multifocal pneumonia vs lung mass causing airway obstruction --Continue Rocephin started 10/7, now s/p azighromycin - Scheduled DuoNebs TID --stop solumedrol, start prednisone, plan to taper --Pulmicort nebs BID --Pulmonology consulted, appreciate recommendations --Follow up MRSA  PCR, sputum culture, respiratory to induce sputum --O2 supplementation, goal spO2 >88% - Incentive spirometry, flutter valve -- on heated high flow today, will have respiratory assess whether truly needed - palliative consult for goals of care discussion, patient is open to this  Debility PT/OT advising LTAC, TOC to investigate  Pleural effusion on right Etiology TBD, but there is concern for recurrent lung cancer. 10/9: US thoracentesis, 1.5 L removed Pleural fluid LDH very high 298 --Pulmonology following --Follow up pleural fluid studies (cytology, cultures, acid fast smear)  Obesity, Class III, BMI 40-49.9 (morbid obesity) ([place]) - This complicates overall care and prognosis.   Multifocal pneumonia Questionable, procal wnl. Treating for it  Type 2 diabetes mellitus with diabetic polyneuropathy, with long-term current use of insulin (HCC) Sliding scale Novolog Adjust insulin PRN  Mass of upper lobe of right lung Has outpatient biopsy scheduled for 02/11/2019.  Follows with pulmonology. They are consulted, see recommendations.  Hyperlipidemia - Atorvastatin 80 mg daily resumed  COPD (chronic obstructive pulmonary disease) ([place]) See respiratory failure  HTN (hypertension) - Hydralazine PRN      Subjective: Pt seen with girlfriend at bedside this morning.  Breathing improved with high flow  Physical Exam: Vitals:   02/10/22 0100 02/10/22 0533 02/10/22 1000 02/10/22 1206  BP: (!) 145/75 125/69 134/63 127/78  Pulse: 67 67 94 86  Resp: 16 20 (!) 26 20  Temp: 98 F (36.7 C) 98.3 F (36.8 C) 98.7 F (37.1 C) 98.2 F (36.8 C)  TempSrc: Oral  Oral   SpO2: 97% 94% 94% 96%  Weight:      Height:        General exam: awake, alert, no  acute distress, obese HEENT: heated HFNC in place, moist mucus membranes, hearing grossly normal  Respiratory system: decreased breath sounds on right, left few scattered wheeze,  speaking in complete sentences Cardiovascular system:  normal S1/S2, RRR Gastrointestinal system: soft, NT, ND Central nervous system: A&O x4. no gross focal neurologic deficits, normal speech Extremities: no lower extremity edema, lower extremity venous stasis, normal tone Skin: dry, intact, normal temperature Psychiatry: normal mood, congruent affect, judgement and insight appear normal    Family Communication: significant other/girlfriend at bedside today  Disposition: Status is: Inpatient Remains inpatient appropriate because: on IV antibiotics, ongoing evaluation, remains on significant amount of supplemental oxygen.   Planned Discharge Destination: TBD, possibly LTAC     Author: Desma Maxim, MD 02/10/2022 1:24 PM  For on call review [email or web address].

## 2022-02-11 ENCOUNTER — Other Ambulatory Visit: Payer: Self-pay | Admitting: Anatomic Pathology & Clinical Pathology

## 2022-02-11 ENCOUNTER — Inpatient Hospital Stay: Payer: PPO

## 2022-02-11 DIAGNOSIS — J9621 Acute and chronic respiratory failure with hypoxia: Secondary | ICD-10-CM | POA: Diagnosis not present

## 2022-02-11 LAB — BASIC METABOLIC PANEL
Anion gap: 10 (ref 5–15)
BUN: 32 mg/dL — ABNORMAL HIGH (ref 8–23)
CO2: 34 mmol/L — ABNORMAL HIGH (ref 22–32)
Calcium: 9.9 mg/dL (ref 8.9–10.3)
Chloride: 100 mmol/L (ref 98–111)
Creatinine, Ser: 1.19 mg/dL (ref 0.61–1.24)
GFR, Estimated: >60 mL/min (ref >60)
Glucose, Bld: 116 mg/dL — ABNORMAL HIGH (ref 70–99)
Potassium: 5 mmol/L (ref 3.5–5.1)
Sodium: 144 mmol/L (ref 135–145)

## 2022-02-11 LAB — CBC
HCT: 52.7 % — ABNORMAL HIGH (ref 39.0–52.0)
Hemoglobin: 16.6 g/dL (ref 13.0–17.0)
MCH: 28.4 pg (ref 26.0–34.0)
MCHC: 31.5 g/dL (ref 30.0–36.0)
MCV: 90.1 fL (ref 80.0–100.0)
Platelets: 200 10*3/uL (ref 150–400)
RBC: 5.85 MIL/uL — ABNORMAL HIGH (ref 4.22–5.81)
RDW: 15.1 % (ref 11.5–15.5)
WBC: 13.7 10*3/uL — ABNORMAL HIGH (ref 4.0–10.5)
nRBC: 0 % (ref 0.0–0.2)

## 2022-02-11 LAB — BODY FLUID CULTURE W GRAM STAIN: Culture: NO GROWTH

## 2022-02-11 LAB — GLUCOSE, CAPILLARY
Glucose-Capillary: 85 mg/dL (ref 70–99)
Glucose-Capillary: 243 mg/dL — ABNORMAL HIGH (ref 70–99)
Glucose-Capillary: 243 mg/dL — ABNORMAL HIGH (ref 70–99)
Glucose-Capillary: 257 mg/dL — ABNORMAL HIGH (ref 70–99)

## 2022-02-11 LAB — CYTOLOGY - NON PAP

## 2022-02-11 LAB — PHOSPHORUS: Phosphorus: 3.4 mg/dL (ref 2.5–4.6)

## 2022-02-11 LAB — MAGNESIUM: Magnesium: 2.4 mg/dL (ref 1.7–2.4)

## 2022-02-11 MED ORDER — PREDNISONE 20 MG PO TABS
30.0000 mg | ORAL_TABLET | Freq: Every day | ORAL | Status: DC
  Administered 2022-02-12: 30 mg via ORAL
  Filled 2022-02-11: qty 1

## 2022-02-11 MED ORDER — IOHEXOL 300 MG/ML  SOLN
75.0000 mL | Freq: Once | INTRAMUSCULAR | Status: AC | PRN
  Administered 2022-02-11: 75 mL via INTRAVENOUS

## 2022-02-11 NOTE — NC FL2 (Signed)
[place] LEVEL OF CARE SCREENING TOOL     IDENTIFICATION  Patient Name: Shawn Lindsey Birthdate: 1951/04/20 Sex: male Admission Date (Current Location): 02/05/2022  [facility] [employer] and [place] Number:  Engineering geologist and Address:  [facility], [address], [place], [place] [address]      Provider Number: [id number]  Attending Physician Name and Address:  Gwynne Edinger, MD  Relative Name and Phone Number:  [name] [phone]    Current Level of Care: Hospital Recommended Level of Care: [place] Prior Approval Number:    Date Approved/Denied:   PASRR Number: [id number]  Discharge Plan: SNF    Current Diagnoses: Patient Active Problem List   Diagnosis Date Noted   Seizure disorder ([place]) 02/10/2022   Anxiety about health 02/08/2022   Pleural effusion on right 02/06/2022   Acute on chronic respiratory failure with hypoxia ([place]) 02/05/2022   History of Klebsiella pneumonia 02/05/2022   Multifocal pneumonia 02/05/2022   Back pain 02/05/2022   Obesity, Class III, BMI 40-49.9 (morbid obesity) ([place]) 02/05/2022   Type 2 diabetes mellitus with diabetic polyneuropathy, with long-term current use of insulin (HCC)    Nephrolithiasis    Ureterolithiasis    Proteus mirabilis infection    Pneumonia of right upper lobe due to Klebsiella pneumoniae ([place])    Acute renal failure with tubular necrosis (HCC)    Acute respiratory failure with hypoxia (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[place]) 08/05/2014   HTN (hypertension) 08/05/2014   COPD (chronic obstructive pulmonary disease) ([place]) 08/05/2014   Hyperlipidemia 08/05/2014    Orientation RESPIRATION BLADDER Height & Weight     Self, Time, Situation, Place  O2 (5L nasal cannula) Continent, External catheter Weight: (!) 303 lb (137.4 kg) Height:  5\' 10"  (177.8 cm)  BEHAVIORAL SYMPTOMS/MOOD  NEUROLOGICAL BOWEL NUTRITION STATUS      Continent Diet (see discharge summary)  AMBULATORY STATUS COMMUNICATION OF NEEDS Skin   Limited Assist Verbally Normal                       Personal Care Assistance Level of Assistance  Bathing, Feeding, Dressing, Total care Bathing Assistance: Limited assistance Feeding assistance: Independent Dressing Assistance: Limited assistance Total Care Assistance: Limited assistance   Functional Limitations Info  Sight, Hearing, Speech Sight Info: Adequate Hearing Info: Adequate Speech Info: Adequate    SPECIAL CARE FACTORS FREQUENCY  PT (By licensed PT), OT (By licensed OT)     PT Frequency: min 4x weekly OT Frequency: min 4x weekly            Contractures Contractures Info: Not present    Additional Factors Info  Code Status, Allergies Code Status Info: full Allergies Info: NKA           Current Medications (02/11/2022):  This is the current hospital active medication list Current Facility-Administered Medications  Medication Dose Route Frequency Provider Last Rate Last Admin   ALPRAZolam Duanne Moron) tablet 0.25 mg  0.25 mg Oral BID PRN Nicole Kindred A, DO   0.25 mg at 02/11/22 0912   atorvastatin (LIPITOR) tablet 80 mg  80 mg Oral q1800 Cox, Amy N, DO   80 mg at 02/10/22 2106   budesonide (PULMICORT) nebulizer solution 0.5 mg  0.5 mg Nebulization BID Flora Lipps, MD   0.5 mg at 02/11/22 0749   dextromethorphan-guaiFENesin (MUCINEX DM) 30-600 MG  per 12 hr tablet 1 tablet  1 tablet Oral BID Nicole Kindred A, DO   1 tablet at 02/11/22 0912   enoxaparin (LOVENOX) injection 67.5 mg  0.5 mg/kg Subcutaneous Q24H Cox, Amy N, DO   67.5 mg at 02/10/22 2106   FLUoxetine (PROZAC) capsule 10 mg  10 mg Oral QPM Nicole Kindred A, DO   10 mg at 02/10/22 1737   fluticasone (FLONASE) 50 MCG/ACT nasal spray 2 spray  2 spray Each Nare Daily Nicole Kindred A, DO   2 spray at 02/11/22 0912   gabapentin (NEURONTIN) tablet 600 mg  600 mg Oral BID  Nicole Kindred A, DO   600 mg at 02/11/22 0912   HYDROcodone-acetaminophen (NORCO/VICODIN) 5-325 MG per tablet 1 tablet  1 tablet Oral Q12H PRN Gwynne Edinger, MD   1 tablet at 02/11/22 0912   insulin aspart (novoLOG) injection 0-5 Units  0-5 Units Subcutaneous QHS Cox, Amy N, DO   2 Units at 02/10/22 2141   insulin aspart (novoLOG) injection 0-9 Units  0-9 Units Subcutaneous TID WC Cox, Amy N, DO   3 Units at 02/11/22 1240   ipratropium-albuterol (DUONEB) 0.5-2.5 (3) MG/3ML nebulizer solution 3 mL  3 mL Nebulization TID Nicole Kindred A, DO   3 mL at 02/11/22 0749   levETIRAcetam (KEPPRA) tablet 500 mg  500 mg Oral BID Nicole Kindred A, DO   500 mg at 02/11/22 0912   levothyroxine (SYNTHROID) tablet 112 mcg  112 mcg Oral Q0600 Cox, Amy N, DO   112 mcg at 02/11/22 0542   lidocaine (LIDODERM) 5 % 1 patch  1 patch Transdermal Q24H Gwynne Edinger, MD   1 patch at 02/11/22 0914   multivitamin with minerals tablet 1 tablet  1 tablet Oral Daily Nicole Kindred A, DO   1 tablet at 02/11/22 0914   predniSONE (DELTASONE) tablet 40 mg  40 mg Oral Q breakfast Gwynne Edinger, MD   40 mg at 02/11/22 0912   senna-docusate (Senokot-S) tablet 1 tablet  1 tablet Oral QHS PRN Cox, Amy N, DO       sodium chloride (OCEAN) 0.65 % nasal spray 1 spray  1 spray Each Nare PRN Nicole Kindred A, DO       zolpidem (AMBIEN) tablet 5 mg  5 mg Oral QHS Nicole Kindred A, DO   5 mg at 02/10/22 2106     Discharge Medications: Please see discharge summary for a list of discharge medications.  Relevant Imaging Results:  Relevant Lab Results:   Additional Information SSN: [id number]  Alberteen Sam, LCSW

## 2022-02-11 NOTE — Progress Notes (Signed)
Occupational Therapy Treatment Patient Details Name: DAVIAN HANSHAW MRN: [id number] DOB: 1950-12-06 Today's Date: 02/11/2022   History of present illness Brandy Kabat is a 60yoM who comes [facility] [place] with [facility]B. PMH: tobacco use, COPD, hypoTSH, depression, GAD, diabetic neuropathy, HTN, HLD. RUL mass. POC includes saturation goal >92%. Workup showing pulmoary effusion, pt now s/p thoracentesis 1.5L 10/9.   OT comments  Chart reviewed, RN cleared pt for participation in OT tx session. Tx session targeted improving activity tolerance in order to facilitate improved participation in ADL tasks. [name], girlfriend, present throughout. Improvements noted in activity tolerance as evidenced by pt able to maintain STS 30 seconds 5 attempts. Grooming completed with SET UP. Short amb transfer to bedside chair completed with CGA with RW. Pt is left in bedside chair, all needs met. Sp02 remained >95% on 6L via HFNC throughout. OT will continue to follow acutely. Recommend STR at this time as pt oxygen requirement appears to continue to improve, if pt oxygen requirement is too high for STR, would recommend rehab at [facility].    Recommendations for follow up therapy are one component of a multi-disciplinary discharge planning process, led by the attending physician.  Recommendations may be updated based on patient status, additional functional criteria and insurance authorization.    Follow Up Recommendations  Skilled nursing-short term rehab (<3 hours/day)    Assistance Recommended at Discharge Set up Supervision/Assistance  Patient can return home with the following  Assist for transportation;Help with stairs or ramp for entrance;Assistance with cooking/housework;A little help with walking and/or transfers;A lot of help with bathing/dressing/bathroom   Equipment Recommendations  Other (comment) (per next venue of care)    Recommendations for Other Services      Precautions / Restrictions  Precautions Precautions: Fall Restrictions Weight Bearing Restrictions: No       Mobility Bed Mobility Overal bed mobility: Needs Assistance Bed Mobility: Supine to Sit     Supine to sit: Supervision          Transfers Overall transfer level: Needs assistance Equipment used: Rolling walker (2 wheels) Transfers: Sit to/from Stand Sit to Stand: From elevated surface, Supervision           General transfer comment: Pt participated in STS 5x, maintaining standing for approx 30 seconds each time with intermittnet vcs for technique in prep for improved ADL participation     Balance Overall balance assessment: Needs assistance Sitting-balance support: Feet supported Sitting balance-Leahy Scale: Good     Standing balance support: Bilateral upper extremity supported Standing balance-Leahy Scale: Fair                             ADL either performed or assessed with clinical judgement   ADL Overall ADL's : Needs assistance/impaired     Grooming: Wash/dry face;Sitting;Set up                   Toilet Transfer: Min guard;Rolling walker (2 wheels) Toilet Transfer Details (indicate cue type and reason): simulated to bedside chair Toileting- Clothing Manipulation and Hygiene: Maximal assistance;Sit to/from stand Toileting - Clothing Manipulation Details (indicate cue type and reason): due to body habitus- pt reports he wipes while seated on toilet at baseline     Functional mobility during ADLs: Min guard;Rolling walker (2 wheels) (short amb transfer)      Extremity/Trunk Assessment              Vision  Perception     Praxis      Cognition Arousal/Alertness: Awake/alert Behavior During Therapy: WFL for tasks assessed/performed Overall Cognitive Status: Within Functional Limits for tasks assessed                                          Exercises      Shoulder Instructions       General Comments spo2 >95%  on HFNC 6 L throughout    Pertinent Vitals/ Pain       Pain Assessment Pain Assessment: Faces Faces Pain Scale: Hurts even more Pain Location: posterior R upper lung Pain Descriptors / Indicators: Discomfort Pain Intervention(s): Limited activity within patient's tolerance, Monitored during session  Home Living                                          Prior Functioning/Environment              Frequency  Min 2X/week        Progress Toward Goals  OT Goals(current goals can now be found in the care plan section)  Progress towards OT goals: Progressing toward goals     Plan Discharge plan remains appropriate    Co-evaluation                 AM-PAC OT "6 Clicks" Daily Activity     Outcome Measure   Help from another person eating meals?: None Help from another person taking care of personal grooming?: A Little Help from another person toileting, which includes using toliet, bedpan, or urinal?: A Lot Help from another person bathing (including washing, rinsing, drying)?: A Lot Help from another person to put on and taking off regular upper body clothing?: A Little Help from another person to put on and taking off regular lower body clothing?: A Lot 6 Click Score: 16    End of Session Equipment Utilized During Treatment: Rolling walker (2 wheels)  OT Visit Diagnosis: Unsteadiness on feet (R26.81);Muscle weakness (generalized) (M62.81);Pain   Activity Tolerance Patient tolerated treatment well   Patient Left in chair;with call bell/phone within reach;with chair alarm set;with family/visitor present   Nurse Communication Mobility status        Time: 0924-0942 OT Time Calculation (min): 18 min  Charges: OT General Charges $OT Visit: 1 Visit OT Treatments $Self Care/Home Management : 8-22 mins  Anna Unwin, OTD OTR/L  02/11/22, 10:05 AM  

## 2022-02-11 NOTE — TOC Progression Note (Signed)
Transition of Care [facility]) - Progression Note    Patient Details  Name: Shawn Lindsey MRN: [id number] Date of Birth: Jan 05, 1951  Transition of Care [facility]) CM/SW [place],  Phone Number: 02/11/2022, 1:53 PM  Clinical Narrative:     Patient potential for dc readiness tomorrow 10/14, CSW has started insurance auth for dc to [facility] tomorrow, Gena at [facility] aware and HTA provided with weekend [facility] phone number to call once auth achieved.   Expected Discharge Plan:  (TBD)    Expected Discharge Plan and Services Expected Discharge Plan:  (TBD)                                               Social Determinants of Health (SDOH) Interventions    Readmission Risk Interventions     No data to display

## 2022-02-11 NOTE — Plan of Care (Signed)

## 2022-02-11 NOTE — Care Management Important Message (Signed)
Important Message  Patient Details  Name: Shawn Lindsey MRN: [id number] Date of Birth: Apr 11, 1951   Medicare Important Message Given:  Yes     Dannette Barbara 02/11/2022, 1:53 PM

## 2022-02-11 NOTE — Consult Note (Signed)
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  He has had a persistent lung mass that was highly suspicious for malignancy, although multiple biopsies were negative.  He recently presented to the emergency room with respiratory distress.  Subsequent work-up included thoracentesis which confirmed malignancy.  Patient still has an oxygen requirement of 5 L, but feels significantly improved since admission.  He has no neurologic complaints.  He denies any recent fevers or illnesses.  He has a good appetite and denies weight loss.  He has no chest pain, cough, or hemoptysis.  He denies any nausea, vomiting, constipation, or diarrhea.  He has no urinary complaints.  Patient otherwise feels well and offers no further specific complaints today.  REVIEW OF SYSTEMS:   Review of Systems  Constitutional:  Positive for malaise/fatigue. Negative for fever and weight loss.  Respiratory:  Positive for shortness of breath. Negative for cough and hemoptysis.   Cardiovascular: Negative.  Negative for chest pain and leg swelling.  Gastrointestinal: Negative.  Negative for abdominal pain.  Genitourinary: Negative.  Negative for dysuria.  Musculoskeletal: Negative.  Negative for back pain.  Skin: Negative.  Negative for rash.  Neurological:  Positive for weakness. Negative for dizziness, focal weakness and headaches.  Psychiatric/Behavioral: Negative.  The patient is not nervous/anxious.     As per HPI. Otherwise, a complete review of systems is  negative.  PAST MEDICAL HISTORY: Past Medical History:  Diagnosis Date   Anxiety    mild   Cancer ([place]) 2015   tonsil   COPD (chronic obstructive pulmonary disease) (HCC)    Diabetes mellitus without complication ([place])    controlled with diet   Elevated cholesterol    GERD (gastroesophageal reflux disease)    History of kidney stones    Hypertension    Hypothyroidism    Morbid obesity with BMI of 40.0-44.9, adult (HCC)    Pneumonia    klebsiella pneumoniae; extended stay in hospital requiring tracheotomy and gastrotomy   Seizures ([place]) 2015   related to sepsis   Septic shock ([place])    Sleep apnea 2019   has a Cpap machime    Stroke ([place]) 2007   no peripheral vision on left side of both eyes & left arm gets numb under stress    PAST SURGICAL HISTORY: Past Surgical History:  Procedure Laterality Date   CYSTOSCOPY W/ RETROGRADES Right 07/23/2019   Procedure: CYSTOSCOPY WITH RETROGRADE PYELOGRAM;  Surgeon: Abbie Sons, MD;  Location: [facility];  Service: Urology;  Laterality: Right;   CYSTOSCOPY WITH STENT PLACEMENT Right 07/05/2019   Procedure: CYSTOSCOPY WITH STENT PLACEMENT;  Surgeon: Abbie Sons, MD;  Location: [facility];  Service: Urology;  Laterality: Right;   CYSTOSCOPY/URETEROSCOPY/HOLMIUM LASER/STENT PLACEMENT Right 07/23/2019   Procedure: CYSTOSCOPY/URETEROSCOPY/HOLMIUM LASER/STENT Exchange;  Surgeon: Abbie Sons, MD;  Location: [facility];  Service: Urology;  Laterality: Right;   CYSTOSCOPY/URETEROSCOPY/HOLMIUM LASER/STENT PLACEMENT Right 07/30/2019   Procedure: CYSTOSCOPY/URETEROSCOPY/HOLMIUM LASER/STENT Exchange;  Surgeon: Abbie Sons, MD;  Location: [facility];  Service: Urology;  Laterality: Right;   ELECTROMAGNETIC NAVIGATION BROCHOSCOPY N/A 03/09/2016  Procedure: ELECTROMAGNETIC NAVIGATION BRONCHOSCOPY;  Surgeon: Flora Lipps, MD;  Location: [facility];  Service: Cardiopulmonary;  Laterality: N/A;   GASTROSTOMY TUBE PLACEMENT     REMOVAL OF GASTROSTOMY  TUBE     TONSILLECTOMY  2015   TRACHEOSTOMY     VIDEO BRONCHOSCOPY WITH ENDOBRONCHIAL NAVIGATION N/A 06/08/2020   Procedure: VIDEO BRONCHOSCOPY WITH ENDOBRONCHIAL NAVIGATION;  Surgeon: Tyler Pita, MD;  Location: [facility];  Service: Pulmonary;  Laterality: N/A;   VIDEO BRONCHOSCOPY WITH ENDOBRONCHIAL ULTRASOUND N/A 06/08/2020   Procedure: VIDEO BRONCHOSCOPY WITH ENDOBRONCHIAL ULTRASOUND;  Surgeon: Tyler Pita, MD;  Location: [facility];  Service: Pulmonary;  Laterality: N/A;   VIDEO BRONCHOSCOPY WITH ENDOBRONCHIAL ULTRASOUND Right 08/02/2021   Procedure: BRONCHOSCOPY WITH ENDOBRONCHIAL ULTRASOUND;  Surgeon: Tyler Pita, MD;  Location: [facility];  Service: Pulmonary;  Laterality: Right;    FAMILY HISTORY: Family History  Problem Relation Age of Onset   Diabetes Brother    Dementia Mother     ADVANCED DIRECTIVES (Y/N):  @ADVDIR @  HEALTH MAINTENANCE: Social History   Tobacco Use   Smoking status: Every Day    Packs/day: 1.50    Years: 40.00    Total pack years: 60.00    Types: Cigarettes   Smokeless tobacco: Never   Tobacco comments:    1ppd - 12/23/2021  not trying to quit.  12/23/2021 hfb  Vaping Use   Vaping Use: Never used  Substance Use Topics   Alcohol use: No   Drug use: No     Colonoscopy:  PAP:  Bone density:  Lipid panel:  No Known Allergies  Current Facility-Administered Medications  Medication Dose Route Frequency Provider Last Rate Last Admin   ALPRAZolam Duanne Moron) tablet 0.25 mg  0.25 mg Oral BID PRN Nicole Kindred A, DO   0.25 mg at 02/11/22 0912   atorvastatin (LIPITOR) tablet 80 mg  80 mg Oral q1800 Cox, Amy N, DO   80 mg at 02/10/22 2106   budesonide (PULMICORT) nebulizer solution 0.5 mg  0.5 mg Nebulization BID Flora Lipps, MD   0.5 mg at 02/11/22 0749   dextromethorphan-guaiFENesin (MUCINEX DM) 30-600 MG per 12 hr tablet 1 tablet  1 tablet Oral BID Nicole Kindred A, DO   1 tablet at 02/11/22 0912   enoxaparin (LOVENOX) injection 67.5 mg   0.5 mg/kg Subcutaneous Q24H Cox, Amy N, DO   67.5 mg at 02/10/22 2106   FLUoxetine (PROZAC) capsule 10 mg  10 mg Oral QPM Nicole Kindred A, DO   10 mg at 02/10/22 1737   fluticasone (FLONASE) 50 MCG/ACT nasal spray 2 spray  2 spray Each Nare Daily Nicole Kindred A, DO   2 spray at 02/11/22 0912   gabapentin (NEURONTIN) tablet 600 mg  600 mg Oral BID Nicole Kindred A, DO   600 mg at 02/11/22 0912   HYDROcodone-acetaminophen (NORCO/VICODIN) 5-325 MG per tablet 1 tablet  1 tablet Oral Q12H PRN Gwynne Edinger, MD   1 tablet at 02/11/22 0912   insulin aspart (novoLOG) injection 0-5 Units  0-5 Units Subcutaneous QHS Cox, Amy N, DO   2 Units at 02/10/22 2141   insulin aspart (novoLOG) injection 0-9 Units  0-9 Units Subcutaneous TID WC Cox, Amy N, DO   3 Units at 02/11/22 1240   ipratropium-albuterol (DUONEB) 0.5-2.5 (3) MG/3ML nebulizer solution 3 mL  3 mL Nebulization TID Nicole Kindred A, DO   3 mL at 02/11/22 1406   levETIRAcetam (KEPPRA) tablet 500 mg  500 mg Oral BID [place] Ped,  Kelly A, DO   500 mg at 02/11/22 0912   levothyroxine (SYNTHROID) tablet 112 mcg  112 mcg Oral Q0600 Cox, Amy N, DO   112 mcg at 02/11/22 0542   lidocaine (LIDODERM) 5 % 1 patch  1 patch Transdermal Q24H Gwynne Edinger, MD   1 patch at 02/11/22 0914   multivitamin with minerals tablet 1 tablet  1 tablet Oral Daily Ezekiel Slocumb, DO   1 tablet at 02/11/22 0914   [START ON 02/12/2022] predniSONE (DELTASONE) tablet 30 mg  30 mg Oral Q breakfast Wouk, Ailene Rud, MD       senna-docusate (Senokot-S) tablet 1 tablet  1 tablet Oral QHS PRN Cox, Amy N, DO       sodium chloride (OCEAN) 0.65 % nasal spray 1 spray  1 spray Each Nare PRN Nicole Kindred A, DO       zolpidem (AMBIEN) tablet 5 mg  5 mg Oral QHS Nicole Kindred A, DO   5 mg at 02/10/22 2106    OBJECTIVE: Vitals:   02/11/22 1233 02/11/22 1234  BP:  113/63  Pulse:  73  Resp:    Temp: 98 F (36.7 C)   SpO2:  96%     Body mass index is 43.48 kg/m.     ECOG FS:2 - Symptomatic, <50% confined to bed  General: Well-developed, well-nourished, no acute distress. Eyes: Pink conjunctiva, anicteric sclera. HEENT: Normocephalic, moist mucous membranes. Lungs: No audible wheezing or coughing. Heart: Regular rate and rhythm. Abdomen: Soft, nontender, no obvious distention. Musculoskeletal: No edema, cyanosis, or clubbing. Neuro: Alert, answering all questions appropriately. Cranial nerves grossly intact. Skin: No rashes or petechiae noted. Psych: Normal affect. Lymphatics: No cervical, calvicular, axillary or inguinal LAD.   LAB RESULTS:  Lab Results  Component Value Date   NA 144 02/11/2022   K 5.0 02/11/2022   CL 100 02/11/2022   CO2 34 (H) 02/11/2022   GLUCOSE 116 (H) 02/11/2022   BUN 32 (H) 02/11/2022   CREATININE 1.19 02/11/2022   CALCIUM 9.9 02/11/2022   PROT 6.6 02/05/2022   ALBUMIN 3.6 02/05/2022   AST 24 02/05/2022   ALT 18 02/05/2022   ALKPHOS 85 02/05/2022   BILITOT 0.7 02/05/2022   GFRNONAA >60 02/11/2022   GFRAA >60 07/11/2019    Lab Results  Component Value Date   WBC 13.7 (H) 02/11/2022   NEUTROABS 10.5 (H) 02/05/2022   HGB 16.6 02/11/2022   HCT 52.7 (H) 02/11/2022   MCV 90.1 02/11/2022   PLT 200 02/11/2022     STUDIES: CT HEAD W & WO CONTRAST (5MM)  Result Date: 02/11/2022 CLINICAL DATA:  Pulmonary lesion concerning for adenocarcinoma concern for metastatic disease. EXAM: CT HEAD WITHOUT AND WITH CONTRAST TECHNIQUE: Contiguous axial images were obtained from the base of the skull through the vertex without and with intravenous contrast. RADIATION DOSE REDUCTION: This exam was performed according to the departmental dose-optimization program which includes automated exposure control, adjustment of the mA and/or kV according to patient size and/or use of iterative reconstruction technique. CONTRAST:  79mL OMNIPAQUE IOHEXOL 300 MG/ML  SOLN COMPARISON:  CT head 07/07/2019 FINDINGS: Brain: There is no acute  intracranial hemorrhage, extra-axial fluid collection, or acute infarct. There is background parenchymal volume loss with prominence of the ventricular system and extra-axial CSF spaces. There is a remote infarct in the right PCA distribution with associated ex vacuo dilatation of the right lateral ventricle, unchanged. There are remote lacunar infarcts in the right lentiform nucleus and left  caudate body, unchanged. Additional patchy hypodensity in the supratentorial white matter likely reflects sequela of underlying chronic small vessel ischemic change. There is no mass lesion or abnormal enhancement. There is no mass effect or midline shift. Vascular: There is calcification of the bilateral carotid siphons. Skull: Normal. Negative for fracture or focal lesion. Sinuses/Orbits: There is mucosal thickening in the left maxillary sinus. Globes and orbits are unremarkable. Other: There is a trace right mastoid effusion. Imaged right nasopharynx is unremarkable. IMPRESSION: No evidence of intracranial metastatic disease. Electronically Signed   By: Valetta Mole M.D.   On: 02/11/2022 16:47   ECHOCARDIOGRAM COMPLETE  Result Date: 02/08/2022    ECHOCARDIOGRAM REPORT   Patient Name:   Isayah A Weekes Date of Exam: 02/08/2022 Medical Rec #:  [id number]      Height:       70.0 in Accession #:    [id number]     Weight:       303.0 lb Date of Birth:  [birth year]      BSA:          2.490 m Patient Age:    89 years       BP:           121/61 mmHg Patient Gender: M              HR:           85 bpm. Exam Location:  [facility] Procedure: 2D Echo, Cardiac Doppler and Color Doppler Indications:     I50.31 CHF Acute Diastolic  History:         Patient has no prior history of Echocardiogram examinations.                  COPD; Risk Factors:Current Smoker, Hypertension, Dyslipidemia                  and Diabetes.  Sonographer:     Rosalia Hammers Referring Phys:  [id number] CARMEN L GONZALEZ Diagnosing Phys: Yolonda Kida MD  Sonographer  Comments: Technically difficult study due to poor echo windows. Image acquisition challenging due to patient body habitus, Image acquisition challenging due to respiratory motion and Image acquisition challenging due to COPD. IMPRESSIONS  1. Left ventricular ejection fraction, by estimation, is 45 to 50%. The left ventricle has mildly decreased function. The left ventricle has no regional wall motion abnormalities. Left ventricular diastolic parameters are consistent with Grade I diastolic dysfunction (impaired relaxation).  2. Right ventricular systolic function is normal. The right ventricular size is normal.  3. The mitral valve is normal in structure. Trivial mitral valve regurgitation.  4. The aortic valve is normal in structure. Aortic valve regurgitation is not visualized. Aortic valve sclerosis is present, with no evidence of aortic valve stenosis. FINDINGS  Left Ventricle: Left ventricular ejection fraction, by estimation, is 45 to 50%. The left ventricle has mildly decreased function. The left ventricle has no regional wall motion abnormalities. The left ventricular internal cavity size was normal in size. There is no left ventricular hypertrophy. Left ventricular diastolic parameters are consistent with Grade I diastolic dysfunction (impaired relaxation). Right Ventricle: The right ventricular size is normal. No increase in right ventricular wall thickness. Right ventricular systolic function is normal. Left Atrium: Left atrial size was normal in size. Right Atrium: Right atrial size was normal in size. Pericardium: There is no evidence of pericardial effusion. Mitral Valve: The mitral valve is normal in structure. Trivial mitral valve regurgitation. Tricuspid Valve: The tricuspid valve  is normal in structure. Tricuspid valve regurgitation is trivial. Aortic Valve: The aortic valve is normal in structure. Aortic valve regurgitation is not visualized. Aortic valve sclerosis is present, with no evidence of  aortic valve stenosis. Aortic valve mean gradient measures 7.0 mmHg. Aortic valve peak gradient measures 12.5 mmHg. Aortic valve area, by VTI measures 2.74 cm. Pulmonic Valve: The pulmonic valve was normal in structure. Pulmonic valve regurgitation is not visualized. Aorta: The ascending aorta was not well visualized. IAS/Shunts: No atrial level shunt detected by color flow Doppler.  LEFT VENTRICLE PLAX 2D LVIDd:         5.00 cm   Diastology LVIDs:         3.90 cm   LV e' medial:    18.90 cm/s LV PW:         1.10 cm   LV E/e' medial:  6.3 LV IVS:        1.10 cm   LV e' lateral:   10.30 cm/s LVOT diam:     2.50 cm   LV E/e' lateral: 11.7 LV SV:         91 LV SV Index:   37 LVOT Area:     4.91 cm  RIGHT VENTRICLE RV Basal diam:  4.10 cm RV Mid diam:    2.80 cm RV S prime:     13.80 cm/s TAPSE (M-mode): 1.9 cm LEFT ATRIUM             Index        RIGHT ATRIUM           Index LA diam:        3.20 cm 1.28 cm/m   RA Area:     14.90 cm LA Vol (A2C):   48.8 ml 19.60 ml/m  RA Volume:   36.40 ml  14.62 ml/m LA Vol (A4C):   65.2 ml 26.18 ml/m LA Biplane Vol: 58.2 ml 23.37 ml/m  AORTIC VALVE AV Area (Vmax):    2.86 cm AV Area (Vmean):   2.75 cm AV Area (VTI):     2.74 cm AV Vmax:           177.00 cm/s AV Vmean:          118.000 cm/s AV VTI:            0.333 m AV Peak Grad:      12.5 mmHg AV Mean Grad:      7.0 mmHg LVOT Vmax:         103.00 cm/s LVOT Vmean:        66.200 cm/s LVOT VTI:          0.186 m LVOT/AV VTI ratio: 0.56  AORTA Ao Root diam: 4.20 cm MITRAL VALVE MV Area (PHT): 3.50 cm     SHUNTS MV Decel Time: 217 msec     Systemic VTI:  0.19 m MV E velocity: 120.00 cm/s  Systemic Diam: 2.50 cm MV A velocity: 122.00 cm/s MV E/A ratio:  0.98 Dwayne D Callwood MD Electronically signed by Yolonda Kida MD Signature Date/Time: 02/08/2022/5:03:18 PM    Final    DG Chest 2 View  Result Date: 02/08/2022 CLINICAL DATA:  Pleural effusion EXAM: CHEST - 2 VIEW COMPARISON:  Chest x-ray dated February 07, 2022  FINDINGS: Cardiac and mediastinal contours are unchanged. Volume loss of the right hemithorax with persistent moderate loculated right pleural effusion and bilateral patchy airspace opacities. No evidence of pneumothorax. IMPRESSION: Unchanged moderate loculated right pleural effusion  and bilateral patchy airspace opacities. Electronically Signed   By: Yetta Glassman M.D.   On: 02/08/2022 13:44   DG Chest Port 1 View  Result Date: 02/07/2022 CLINICAL DATA:  Status post right thoracentesis. EXAM: PORTABLE CHEST 1 VIEW COMPARISON:  02/05/2022 FINDINGS: Status post right thoracentesis with slight improved aeration of the residual right lung. No pneumothorax. Stable appearance of left lung. IMPRESSION: Status post right thoracentesis with slight improved aeration of the residual right lung. No pneumothorax. Electronically Signed   By: Aletta Edouard M.D.   On: 02/07/2022 12:04   US THORACENTESIS ASP PLEURAL SPACE W/IMG GUIDE  Result Date: 02/07/2022 INDICATION: 71 year old male inpatient smoker. History of COPD, diabetes, hypertension, morbid obesity, CVA. With a known right upper lobe lung mass. Admitted for acute on chronic respiratory failure found to have a right-sided pleural effusion. Request is for therapeutic and diagnostic right-sided thoracentesis EXAM: ULTRASOUND GUIDED THERAPEUTIC AND DIAGNOSTIC RIGHT-SIDED THORACENTESIS MEDICATIONS: LIDOCAINE 1% 10 ML COMPLICATIONS: None immediate. PROCEDURE: An ultrasound guided thoracentesis was thoroughly discussed with the patient and questions answered. The benefits, risks, alternatives and complications were also discussed. The patient understands and wishes to proceed with the procedure. Written consent was obtained. Ultrasound was performed to localize and mark an adequate pocket of fluid in the right chest. The area was then prepped and draped in the normal sterile fashion. 1% Lidocaine was used for local anesthesia. Under ultrasound guidance a 6 Fr  Safe-T-Centesis catheter was introduced. Thoracentesis was performed. The catheter was removed and a dressing applied. FINDINGS: A total of approximately 1.5 L of straw-colored fluid was removed. Samples were sent to the laboratory as requested by the clinical team. IMPRESSION: Successful ultrasound guided therapeutic and diagnostic right-sided thoracentesis yielding of pleural fluid. Read by: Rushie Nyhan, NP Electronically Signed   By: Corrie Mckusick D.O.   On: 02/07/2022 12:02   CT Angio Chest PE W and/or Wo Contrast  Result Date: 02/05/2022 CLINICAL DATA:  Increased shortness of breath. Smoker. Reports right upper lung collapse for 3 weeks. EXAM: CT ANGIOGRAPHY CHEST WITH CONTRAST TECHNIQUE: Multidetector CT imaging of the chest was performed using the standard protocol during bolus administration of intravenous contrast. Multiplanar CT image reconstructions and MIPs were obtained to evaluate the vascular anatomy. RADIATION DOSE REDUCTION: This exam was performed according to the departmental dose-optimization program which includes automated exposure control, adjustment of the mA and/or kV according to patient size and/or use of iterative reconstruction technique. CONTRAST:  157mL OMNIPAQUE IOHEXOL 350 MG/ML SOLN COMPARISON:  Current chest radiograph.  Chest CT, 12/21/2021. FINDINGS: Cardiovascular: Pulmonary arteries are well opacified. Respiratory motion mildly limits assessment of the smaller segmental/subsegmental vessels, particularly to the left lower lobe. Allowing for this, there is no evidence of a pulmonary embolism. Heart is normal in size. Left and circumflex coronary artery calcifications. No pericardial effusion. Aorta normal in caliber. Aortic atherosclerosis. No dissection. Mediastinum/Nodes: Mild mediastinal adenopathy. Precarinal node measures 1.7 cm short axis. Subcarinal node measures 1.7 cm in short axis. Enlarged left hilar node, 1.4 cm in short axis. No mediastinal or hilar  masses. Trachea is partly collapse, exam appears extra tori. Trachea otherwise unremarkable. Normal esophagus. Lungs/Pleura: Large moderate right pleural effusion. Right upper lobe bronchus is occluded. There is surrounding soft tissue, contiguous with the partly, which could reflect a mass, approximately 4 x 3.4 cm transversely. There is patchy airspace consolidation in the right lower lobe collapsed right upper lobe. Right middle lobe is collapsed. Additional patchy opacities noted in the right upper lobe.  Patchy areas of ground-glass opacity are noted in the left lower lobe and anteromedial left upper lobe. 8 mm nodule, subpleural, left lower lobe, image 114/5. Moderate centrilobular emphysema. No left pleural effusion. No pneumothorax. Upper Abdomen: No acute findings. No visualized liver or adrenal mass. Dependent gallstone. Musculoskeletal: No fracture or acute finding. No bone lesion. No chest wall mass. Review of the MIP images confirms the above findings. IMPRESSION: 1. No evidence of a pulmonary embolism. 2. Possible central mass versus collapsed lung along the superior right hilum, where the right upper lobe bronchus is occluded. 3. Patchy areas of right upper and lower lobe opacity consistent with multifocal pneumonia. 4. Moderate right pleural effusion. 5. Patchy ground-glass type opacities in the left upper and left lower lobe may reflect additional areas of infection, but are similar to the prior CT, and may be chronic. 6. 8 mm left lower lobe nodule. 7. Mediastinal and left hilar lymphadenopathy unchanged from the prior CT. 8. Coronary artery calcifications and aortic atherosclerosis. Aortic Atherosclerosis (ICD10-I70.0) and Emphysema (ICD10-J43.9). Electronically Signed   By: Lajean Manes M.D.   On: 02/05/2022 16:41   DG Chest Portable 1 View  Result Date: 02/05/2022 CLINICAL DATA:  Increasing shortness of breath EXAM: PORTABLE CHEST 1 VIEW COMPARISON:  Previous chest radiographs done on  08/02/2021 and CT chest done on 12/21/2021 FINDINGS: Transverse diameter of heart is increased. There is slight prominence of interstitial markings in left lung. There is decreased volume in right lung. There is pleural density in the periphery of right lung. There is homogeneous opacity in right upper and right lower lung fields. Left lateral CP angle is clear. There is no pneumothorax. IMPRESSION: There is interval decrease in volume in right lung along with increasing amount of right pleural effusion. Part of the increased opacity in right lung may be due to underlying neoplastic process and pneumonia. Prominence of interstitial markings in left lung may suggest interstitial edema or interstitial pneumonia. Electronically Signed   By: Elmer Picker M.D.   On: 02/05/2022 14:57    ASSESSMENT: Stage IV adenocarcinoma of the lung  PLAN:    Stage IV adenocarcinoma of the lung: Confirmed from pleural fluid on thoracentesis.  Although patient has a decreased performance status and poor respiratory reserve, he expressed interest in definitive treatment likely using combination chemotherapy and radiation therapy followed by maintenance immunotherapy.  He expressed understanding that this malignancy may not be curable, but is treatable.  CT of the head on February 11, 2022 did not reveal any metastatic disease.  PET scan results from January 12, 2022 reviewed independently with no obvious hypermetabolism outside the thoracic cavity.  Okay to discharge from an oncology standpoint and will arrange follow-up in the cancer center in 1 to 2 weeks for treatment planning.  Can discuss with pulmonology.  Appreciate consult, will follow.    Lloyd Huger, MD   02/11/2022 5:57 PM

## 2022-02-11 NOTE — Progress Notes (Signed)
Physical Therapy Treatment Patient Details Name: Shawn Lindsey MRN: [id number] DOB: 07-Mar-1951 Today's Date: 02/11/2022   History of Present Illness Shawn Lindsey is a 7yoM who comes to [facility] on 02/05/22 from [place] with SOB. PMH: tobacco use, COPD, hypoTSH, depression, GAD, diabetic neuropathy, HTN, HLD. RUL mass. POC includes saturation goal >92%. Workup showing ulmoary effusion, pt now s/p thoracentesis 1.5L 10/9.    PT Comments    Pt tolerated treatment well today and was able to improve overall assist levels, O2 dependence, activity tolerance, and ambulation distance since last session. Pt was mod I for bed mobility and was provided supervision for safety with transfers and gait in RW. Pt able to demonstrate Ind with STS from lower surface height, and will continue to be challenged in this aspect. Able to maintain SpO2 >90% throughout session; RN notified. Despite progress, pt continues to be limited with meeting goals secondary to decreased activity tolerance and O2 dependence. Pt will continue to benefit from skilled acute PT services to address deficits for return to baseline function. Will continue to recommend LTACH vs. SNF at DC pending O2 needs.  Pt demonstrates great safety awareness and insight into deficits throughout session. He is appropriate to stand at bedside Ind vs. Ambulate short distance to [facility] for toileting. RN and pt verbalized understanding.      Recommendations for follow up therapy are one component of a multi-disciplinary discharge planning process, led by the attending physician.  Recommendations may be updated based on patient status, additional functional criteria and insurance authorization.  Follow Up Recommendations  PT at Long-term acute care hospital (vs. SNF pending O2 dependence)     Assistance Recommended at Discharge Set up Supervision/Assistance  Patient can return home with the following Assist for transportation;Help with stairs or ramp for entrance;A  little help with walking and/or transfers;A little help with bathing/dressing/bathroom;Assistance with cooking/housework   Equipment Recommendations  None recommended by PT       Precautions / Restrictions Precautions Precautions: Fall Restrictions Weight Bearing Restrictions: No     Mobility  Bed Mobility Overal bed mobility: Needs Assistance Bed Mobility: Supine to Sit     Supine to sit: Modified independent (Device/Increase time) Sit to supine: Modified independent (Device/Increase time)   General bed mobility comments: increased time/effort, HOB elevated, use of BUE for support    Transfers Overall transfer level: Needs assistance Equipment used: Rolling walker (2 wheels) Transfers: Sit to/from Stand Sit to Stand: From elevated surface, Supervision           General transfer comment: performed multiple repetitions of STS from EOB. Decreasing bed height with each subsequent repetition. Verbal cues for hand placement.    Ambulation/Gait Ambulation/Gait assistance: Supervision Gait Distance (Feet): 80 Feet (32ft x2) Assistive device: Rolling walker (2 wheels)         General Gait Details: Supervision for safety and line management. Demonstrates slowed cadence, decreased RW proximity, increased UE reliance, labored breathing, and decreased step length/foot clearance bil. Seated rest break between. SpO2 >90% on 6L. RPE of 4-6/10 indicating "moderate activity"       Balance Overall balance assessment: Needs assistance Sitting-balance support: Feet supported Sitting balance-Leahy Scale: Good     Standing balance support: Bilateral upper extremity supported Standing balance-Leahy Scale: Fair Standing balance comment: UE support on RW                            Cognition Arousal/Alertness: Awake/alert Behavior During Therapy: [facility]  for tasks assessed/performed Overall Cognitive Status: Within Functional Limits for tasks assessed                                           Exercises Other Exercises Other Exercises: Participates in bed mobility, x5 STS transfers from elevated bed height (subsequently lowering EOB with each repetition), and x2 bouts of gait with RW. Continues to be mod I-supervision, Other Exercises: Pt and visitor educated re: PT role/POC, DC recommendations, progress with participation, transferring to [facility] for toileting with RW.    General Comments General comments (skin integrity, edema, etc.): SpO2 >95% on HFNC 6L throughout session      Pertinent Vitals/Pain Pain Assessment Pain Assessment: Faces Faces Pain Scale: Hurts a little bit Pain Location: posterior R upper lung Pain Descriptors / Indicators: Discomfort Pain Intervention(s): Monitored during session     PT Goals (current goals can now be found in the care plan section) Acute Rehab PT Goals Patient Stated Goal: regain strength for return to come, have greater confidence in balance ability PT Goal Formulation: With patient Time For Goal Achievement: 02/23/22 Potential to Achieve Goals: Good Progress towards PT goals: Progressing toward goals    Frequency    Min 2X/week      PT Plan Current plan remains appropriate       AM-PAC PT "6 Clicks" Mobility   Outcome Measure  Help needed turning from your back to your side while in a flat bed without using bedrails?: None Help needed moving from lying on your back to sitting on the side of a flat bed without using bedrails?: None Help needed moving to and from a bed to a chair (including a wheelchair)?: A Little Help needed standing up from a chair using your arms (e.g., wheelchair or bedside chair)?: A Little Help needed to walk in hospital room?: A Little Help needed climbing 3-5 steps with a railing? : A Lot 6 Click Score: 19    End of Session Equipment Utilized During Treatment: Oxygen;Gait belt Activity Tolerance: Patient tolerated treatment well;No increased  pain Patient left: with family/visitor present;with call bell/phone within reach;in bed;with bed alarm set Nurse Communication: Mobility status PT Visit Diagnosis: Difficulty in walking, not elsewhere classified (R26.2);Unsteadiness on feet (R26.81);Other abnormalities of gait and mobility (R26.89);Muscle weakness (generalized) (M62.81)     Time: 3500-9381 PT Time Calculation (min) (ACUTE ONLY): 29 min  Charges:  $Therapeutic Exercise: 23-37 mins                     Herminio Commons, PT, DPT 1:24 PM,02/11/22 Physical Therapist - [facility]

## 2022-02-11 NOTE — Progress Notes (Signed)
Progress Note   Patient: Shawn Lindsey [id number]:[id number] DOB: 04/04/1951 DOA: 02/05/2022     6 DOS: the patient was seen and examined on 02/11/2022   Brief hospital course: Shawn Lindsey is a 71 year old male with history of pulmonary lesion concerning for adenocarcinoma, tobacco use, COPD on 3 L/min O2 with sleep at baseline (followed by pulmonology), hypothyroid, depression, anxiety, neuropathy, hypertension, hyperlipidemia, who presented to the ED on 02/05/2022 for evaluation of progressively worsening shortness of breath.   In the ED, afebrile, RR 30, HR 70's-80's, stable BP, and hypoxic with spO2 of 90% on 6 L/min supplement O2.  Labs notable for creatinine of 1.43, WBC 13, BNP 262.3.  High sensitive troponin was 19.  Procalcitonin < 0.10.   CTA of the chest was read as negative for PE.   Findings consistent with multifocal pneumonia and possible neoplasm.  It showed  Large right pleural effusion, occluded RUL bronchus with continguous soft tissue felt to possibly reflect a mass 4 x 3.4 cm in size.  RML, RLL are collapsed.  Patchy airspace opacities RUL, LLL and LUL's, LLL 8 mm nodule.   Pt was admitted to medicine service, started on IV antibiotics for presumed community-acquired multifocal pneumonia.  Pulmonology consulted.   Patient is followed by [facility] Pulmonology, primarily Dr. Patsey Berthold.  He had a CT-guided lung biopsy scheduled as outpatient for 10/12.  That procedure is on hold given current respiratory status. Thoracentesis was performed and cytology pending.     Assessment and Plan: * Acute on chronic respiratory failure with hypoxia (HCC) Met for sepsis with tachypnea, leukocytosis, source of multifocal pneumonia vs lung mass causing airway obstruction --now s/p 7 day course ceftriaxone and azith - Scheduled DuoNebs TID --stop solumedrol, start prednisone, plan to taper --Pulmicort nebs BID --Pulmonology consulted, appreciate recommendations --Follow up MRSA PCR, sputum  culture, respiratory to induce sputum --O2 supplementation, goal spO2 >88% - Incentive spirometry, flutter valve -- on heated high flow today, will have respiratory assess whether truly needed - palliative consult for goals of care discussion, patient is open to this  Debility PT/OT advising LTAC, TOC investigating, though if can maintain o2 at 5 or under can go to snf  Pleural effusion on right, 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   Multifocal pneumonia Questionable, procal wnl. Treating for it  Type 2 diabetes mellitus with diabetic polyneuropathy, with long-term current use of insulin (HCC) Sliding scale Novolog Adjust insulin PRN  Mass of upper lobe of right lung Has outpatient biopsy scheduled for 02/11/2019.  Follows with pulmonology. They are consulted, see recommendations.  Hyperlipidemia - Atorvastatin 80 mg daily resumed  COPD (chronic obstructive pulmonary disease) ([place]) See respiratory failure  HTN (hypertension) - Hydralazine PRN      Subjective: breathing improving, has appetite  Physical Exam: Vitals:   02/11/22 0901 02/11/22 0956 02/11/22 1233 02/11/22 1234  BP: (!) 148/69   113/63  Pulse: 75   73  Resp: 20     Temp:   98 F (36.7 C)   TempSrc:   Oral   SpO2:  98%  96%  Weight:      Height:        General exam: awake, alert, no acute distress, obese HEENT: heated HFNC in place,  moist mucus membranes, hearing grossly normal  Respiratory system: decreased breath sounds on right, left few scattered wheeze,  speaking in complete sentences Cardiovascular system: normal S1/S2, RRR Gastrointestinal system: soft, NT, ND Central nervous system: A&O x4. no gross focal neurologic deficits, normal  speech Extremities: no lower extremity edema, lower extremity venous stasis, normal tone Skin: dry, intact, normal temperature Psychiatry: normal mood, congruent affect, judgement and insight appear normal    Family Communication: none at bedside  Disposition: Status is: Inpatient Remains inpatient appropriate because: dispo planning   Planned Discharge Destination: snf vs ltac     Author: Desma Maxim, MD 02/11/2022 1:44 PM  For on call review [email or web address].

## 2022-02-11 NOTE — Progress Notes (Signed)
Tolerated cpap well overnight; slept most of night.

## 2022-02-12 LAB — BASIC METABOLIC PANEL
Anion gap: 6 (ref 5–15)
BUN: 31 mg/dL — ABNORMAL HIGH (ref 8–23)
CO2: 32 mmol/L (ref 22–32)
Calcium: 8.8 mg/dL — ABNORMAL LOW (ref 8.9–10.3)
Chloride: 103 mmol/L (ref 98–111)
Creatinine, Ser: 1.12 mg/dL (ref 0.61–1.24)
GFR, Estimated: >60 mL/min (ref >60)
Glucose, Bld: 105 mg/dL — ABNORMAL HIGH (ref 70–99)
Potassium: 3.7 mmol/L (ref 3.5–5.1)
Sodium: 141 mmol/L (ref 135–145)

## 2022-02-12 LAB — CULTURE, RESPIRATORY W GRAM STAIN: Culture: NORMAL

## 2022-02-12 LAB — CBC
HCT: 45.3 % (ref 39.0–52.0)
Hemoglobin: 14.5 g/dL (ref 13.0–17.0)
MCH: 28.5 pg (ref 26.0–34.0)
MCHC: 32 g/dL (ref 30.0–36.0)
MCV: 89 fL (ref 80.0–100.0)
Platelets: 197 10*3/uL (ref 150–400)
RBC: 5.09 MIL/uL (ref 4.22–5.81)
RDW: 15.1 % (ref 11.5–15.5)
WBC: 13.8 10*3/uL — ABNORMAL HIGH (ref 4.0–10.5)
nRBC: 0 % (ref 0.0–0.2)

## 2022-02-12 LAB — GLUCOSE, CAPILLARY
Glucose-Capillary: 82 mg/dL (ref 70–99)
Glucose-Capillary: 168 mg/dL — ABNORMAL HIGH (ref 70–99)
Glucose-Capillary: 301 mg/dL — ABNORMAL HIGH (ref 70–99)
Glucose-Capillary: 209 mg/dL — ABNORMAL HIGH (ref 70–99)

## 2022-02-12 MED ORDER — PREDNISONE 20 MG PO TABS
20.0000 mg | ORAL_TABLET | Freq: Every day | ORAL | Status: DC
  Administered 2022-02-13 – 2022-02-14 (×2): 20 mg via ORAL
  Filled 2022-02-12 (×2): qty 1

## 2022-02-12 NOTE — Progress Notes (Addendum)
Progress Note   Patient: Shawn Lindsey [id number]:[id number] DOB: Feb 08, 1951 DOA: 02/05/2022     7 DOS: the patient was seen and examined on 02/12/2022   Brief hospital course: [name] is a 71 year old male with history of pulmonary lesion concerning for adenocarcinoma, tobacco use, COPD on 3 L/min O2 with sleep at baseline (followed by Lindsey), hypothyroid, depression, anxiety, neuropathy, hypertension, hyperlipidemia, who presented to the ED on 02/05/2022 for evaluation of progressively worsening shortness of breath.   In the ED, afebrile, RR 30, HR 70's-80's, stable BP, and hypoxic with spO2 of 90% on 6 L/min supplement O2.  Labs notable for creatinine of 1.43, WBC 13, BNP 262.3.  High sensitive troponin was 19.  Procalcitonin < 0.10.   CTA of the chest was read as negative for PE.   Findings consistent with multifocal pneumonia and possible neoplasm.  It showed  Large right pleural effusion, occluded RUL bronchus with continguous soft tissue felt to possibly reflect a mass 4 x 3.4 cm in size.  RML, RLL are collapsed.  Patchy airspace opacities RUL, LLL and LUL's, LLL 8 mm nodule.   Pt was admitted to medicine service, started on IV antibiotics for presumed community-acquired multifocal pneumonia.  Lindsey consulted.   Patient is followed by Shawn Lindsey, primarily Shawn Lindsey.  He had a CT-guided lung biopsy scheduled as outpatient for 10/12.  That procedure is on hold given current respiratory status. Thoracentesis was performed and cytology pending.     Assessment and Plan: * Acute on chronic respiratory failure with hypoxia (HCC) Met for sepsis with tachypnea, leukocytosis, source of multifocal pneumonia vs lung mass causing airway obstruction --now s/p 7 day course ceftriaxone and azith - Scheduled DuoNebs TID --prednisone taper --Lindsey consulted, appreciate recommendations --Follow up MRSA PCR, sputum culture, respiratory to induce sputum --O2  supplementation, goal spO2 >88% - Incentive spirometry, flutter valve -- on [place] O2 today, 3 L - palliative consult for goals of care discussion, patient is open to this  [place] for snf now that o2 is improved, pending insurance auth, [facility] so looks like d/c will be monday  Pleural effusion on right, malignant Path shows adenocarcinoma 10/9: US thoracentesis, 1.5 L removed - CT of head  neg (can't tolerate mri) - outpt onco f/u, Shawn Lindsey  Obesity, Class III, BMI 40-49.9 (morbid obesity) ([place]) - This complicates overall care and prognosis.   Multifocal pneumonia Questionable, procal wnl. Treated for it with 7 day course cef/azith  Type 2 diabetes mellitus with diabetic polyneuropathy, with long-term current use of insulin (HCC) Sliding scale Novolog Adjust insulin PRN  Hyperlipidemia - Atorvastatin 80 mg daily resumed  COPD (chronic obstructive pulmonary disease) ([place]) See respiratory failure  HTN (hypertension) - Hydralazine PRN      Subjective: breathing improving, has appetite  Physical Exam: Vitals:   02/12/22 0429 02/12/22 0825 02/12/22 1159 02/12/22 1300  BP: 132/71 136/76 124/73   Pulse: 69 72 72   Resp:  18 20   Temp:  (!) 97.4 F (36.3 C) 98.6 F (37 C)   TempSrc:      SpO2: 93% 96% 93% 93%  Weight:      Height:        General exam: awake, alert, no acute distress, obese HEENT: heated HFNC in place, moist mucus membranes, hearing grossly normal  Respiratory system: decreased breath sounds on right, left few scattered wheeze,  speaking in complete sentences Cardiovascular system: normal S1/S2, RRR Gastrointestinal system: soft, NT,  ND Central nervous system: A&O x4. no gross focal neurologic deficits, normal speech Extremities: no lower extremity edema, lower extremity venous stasis, normal tone Skin: dry, intact, normal temperature Psychiatry: normal mood, congruent affect, judgement and insight appear  normal    Family Communication: wife updated @ bedside 10/14  Disposition: Status is: Inpatient Remains inpatient appropriate because: insurance auth for snf pending   Planned Discharge Destination: snf       Author: Desma Maxim, MD 02/12/2022 1:20 PM  For on call review [email or web address].

## 2022-02-12 NOTE — TOC Progression Note (Addendum)
Transition of Care [facility]) - Progression Note    Patient Details  Name: Shawn Lindsey MRN: [id number] Date of Birth: 08/28/1950  Transition of Care [facility]) CM/SW Contact  Izola Price, RN Phone Number: 02/12/2022, 12:13 PM  Clinical Narrative: 10/14: HTA contacted first thing this am and authorization was next on list. Then HTA rep. Contacted RN CM indicated concern about facility accepting 6L/HFNC which patient was on when documents submitted. Nursing Flowsheets indicate patient is on 3L/[place], updated HTA and pending request from [place] as to what ceiling is on HF O2 in needs increasing for activity or change in condition/oxygen demands. Updated provider. HTA will pass on information to authorization reviewer. Simmie Davies RN CM UPDATE: [facility] returned RN CM call. Can take HF [place] up to 6L/[place] and patient is currently at 3L/[place]. Let HTA know but review for auth will not be completed before [facility] needs DC Summary today for admission if authorization is approved. [facility] cannot admit on Sunday. So Monday is target date now. Simmie Davies RN CM   [facility]aufmann. #[id number] SNF auth #[id number] at 150 pm. Simmie Davies RN CM    Expected Discharge Plan:  (TBD)    Expected Discharge Plan and Services Expected Discharge Plan:  (TBD)                                               Social Determinants of Health (SDOH) Interventions    Readmission Risk Interventions     No data to display

## 2022-02-13 LAB — GLUCOSE, CAPILLARY
Glucose-Capillary: 94 mg/dL (ref 70–99)
Glucose-Capillary: 229 mg/dL — ABNORMAL HIGH (ref 70–99)
Glucose-Capillary: 243 mg/dL — ABNORMAL HIGH (ref 70–99)
Glucose-Capillary: 193 mg/dL — ABNORMAL HIGH (ref 70–99)

## 2022-02-13 NOTE — Progress Notes (Addendum)
Progress Note   Patient: Shawn Lindsey TRR:[id number] DOB: 13-Jul-1950 DOA: 02/05/2022     8 DOS: the patient was seen and examined on 02/13/2022   Brief hospital course: 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   In the ED, afebrile, RR 30, HR 70's-80's, stable BP, and hypoxic with spO2 of 90% on 6 L/min supplement O2.  Labs notable for creatinine of 1.43, WBC 13, BNP 262.3.  High sensitive troponin was 19.  Procalcitonin < 0.10.   CTA of the chest was read as negative for PE.   Findings consistent with multifocal pneumonia and possible neoplasm.  It showed  Large right pleural effusion, occluded RUL bronchus with continguous soft tissue felt to possibly reflect a mass 4 x 3.4 cm in size.  RML, RLL are collapsed.  Patchy airspace opacities RUL, LLL and LUL's, LLL 8 mm nodule.   Pt was admitted to medicine service, started on IV antibiotics for presumed community-acquired multifocal pneumonia.  Pulmonology consulted.   Patient is followed by [facility] Pulmonology, primarily Dr. Patsey Lindsey.  He had a CT-guided lung biopsy scheduled as outpatient for 10/12.  That procedure is on hold given current respiratory status. Thoracentesis was performed and cytology pending.     Assessment and Plan: * Acute on chronic respiratory failure with hypoxia (HCC) Met for sepsis with tachypnea, leukocytosis, source of multifocal pneumonia vs lung mass causing airway obstruction --now s/p 7 day course ceftriaxone and azith - Scheduled DuoNebs TID --prednisone taper --Pulmonology consulted, appreciate recommendations --Follow up MRSA PCR, sputum culture, respiratory to induce sputum --O2  supplementation, goal spO2 >88% - Incentive spirometry, flutter valve -- on [place] O2 today, 3 L, stable from yesterday - palliative consult for goals of care discussion, patient is open to this  [place] for snf now that o2 is improved, pending insurance auth, [facility] so looks like d/c will be tomorrow  Pleural effusion on right, malignant Path shows adenocarcinoma 10/9: US thoracentesis, 1.5 L removed - CT of head  neg (can't tolerate mri) - outpt onco f/u, dr. [place] Lindsey  Obesity, Class III, BMI 40-49.9 (morbid obesity) ([place]) - This complicates overall care and prognosis.   Multifocal pneumonia Questionable, procal wnl. Treated for it with 7 day course cef/azith  Type 2 diabetes mellitus with diabetic polyneuropathy, with long-term current use of insulin (HCC) Sliding scale Novolog Adjust insulin PRN  Hyperlipidemia - Atorvastatin 80 mg daily resumed  Seizure disorder - cont home keppra  COPD (chronic obstructive pulmonary disease) ([place]) See respiratory failure  HTN (hypertension) - Hydralazine PRN      Subjective: breathing stable, having BMs  Physical Exam: Vitals:   02/12/22 2050 02/13/22 0127 02/13/22 0612 02/13/22 0800  BP: 139/80 (!) 120/57 (!) 140/64 135/70  Pulse: 78 66 68 75  Resp: 20 16  20   Temp: 98.1 F (36.7 C)  98.7 F (37.1 C) 97.6 F (36.4 C)  TempSrc: Oral  Oral   SpO2: 95% 98% 93% 96%  Weight:      Height:        General exam: awake, alert, no acute distress, obese HEENT: heated HFNC in place, moist mucus membranes, hearing grossly normal  Respiratory system: decreased breath sounds on right, left few scattered wheeze,  speaking in complete sentences Cardiovascular system: normal S1/S2, RRR Gastrointestinal system: soft, NT, ND Central nervous system: A&O x4. no gross focal neurologic deficits, normal speech Extremities: no lower extremity edema, lower extremity venous stasis, normal tone Skin: dry, intact,  normal temperature Psychiatry: normal mood, congruent affect, judgement and insight appear normal    Family Communication: wife updated @ bedside 10/15  Disposition: Status is: Inpatient Remains inpatient appropriate because: insurance auth for snf pending   Planned Discharge Destination: snf       Author: Desma Maxim, MD 02/13/2022 11:04 AM  For on call review [email or web address].

## 2022-02-14 ENCOUNTER — Institutional Professional Consult (permissible substitution): Payer: PPO | Admitting: Radiation Oncology

## 2022-02-14 DIAGNOSIS — K219 Gastro-esophageal reflux disease without esophagitis: Secondary | ICD-10-CM | POA: Diagnosis not present

## 2022-02-14 DIAGNOSIS — Z79899 Other long term (current) drug therapy: Secondary | ICD-10-CM | POA: Diagnosis not present

## 2022-02-14 DIAGNOSIS — Z736 Limitation of activities due to disability: Secondary | ICD-10-CM | POA: Diagnosis not present

## 2022-02-14 DIAGNOSIS — C3491 Malignant neoplasm of unspecified part of right bronchus or lung: Secondary | ICD-10-CM | POA: Diagnosis not present

## 2022-02-14 DIAGNOSIS — R590 Localized enlarged lymph nodes: Secondary | ICD-10-CM | POA: Diagnosis not present

## 2022-02-14 DIAGNOSIS — J150 Pneumonia due to Klebsiella pneumoniae: Secondary | ICD-10-CM | POA: Diagnosis not present

## 2022-02-14 DIAGNOSIS — I700 Atherosclerosis of aorta: Secondary | ICD-10-CM | POA: Diagnosis not present

## 2022-02-14 DIAGNOSIS — R29898 Other symptoms and signs involving the musculoskeletal system: Secondary | ICD-10-CM | POA: Diagnosis not present

## 2022-02-14 DIAGNOSIS — R2681 Unsteadiness on feet: Secondary | ICD-10-CM | POA: Diagnosis not present

## 2022-02-14 DIAGNOSIS — I2510 Atherosclerotic heart disease of native coronary artery without angina pectoris: Secondary | ICD-10-CM | POA: Diagnosis not present

## 2022-02-14 DIAGNOSIS — I110 Hypertensive heart disease with heart failure: Secondary | ICD-10-CM | POA: Diagnosis not present

## 2022-02-14 DIAGNOSIS — J90 Pleural effusion, not elsewhere classified: Secondary | ICD-10-CM | POA: Diagnosis not present

## 2022-02-14 DIAGNOSIS — E7800 Pure hypercholesterolemia, unspecified: Secondary | ICD-10-CM | POA: Diagnosis not present

## 2022-02-14 DIAGNOSIS — C3411 Malignant neoplasm of upper lobe, right bronchus or lung: Secondary | ICD-10-CM | POA: Diagnosis not present

## 2022-02-14 DIAGNOSIS — J449 Chronic obstructive pulmonary disease, unspecified: Secondary | ICD-10-CM | POA: Diagnosis not present

## 2022-02-14 DIAGNOSIS — E039 Hypothyroidism, unspecified: Secondary | ICD-10-CM | POA: Diagnosis not present

## 2022-02-14 DIAGNOSIS — F17210 Nicotine dependence, cigarettes, uncomplicated: Secondary | ICD-10-CM | POA: Diagnosis not present

## 2022-02-14 DIAGNOSIS — Z85818 Personal history of malignant neoplasm of other sites of lip, oral cavity, and pharynx: Secondary | ICD-10-CM | POA: Diagnosis not present

## 2022-02-14 DIAGNOSIS — Z515 Encounter for palliative care: Secondary | ICD-10-CM | POA: Diagnosis not present

## 2022-02-14 DIAGNOSIS — G40909 Epilepsy, unspecified, not intractable, without status epilepticus: Secondary | ICD-10-CM | POA: Diagnosis not present

## 2022-02-14 DIAGNOSIS — J9621 Acute and chronic respiratory failure with hypoxia: Secondary | ICD-10-CM | POA: Diagnosis not present

## 2022-02-14 DIAGNOSIS — Z9981 Dependence on supplemental oxygen: Secondary | ICD-10-CM | POA: Diagnosis not present

## 2022-02-14 DIAGNOSIS — J432 Centrilobular emphysema: Secondary | ICD-10-CM | POA: Diagnosis not present

## 2022-02-14 DIAGNOSIS — Z8673 Personal history of transient ischemic attack (TIA), and cerebral infarction without residual deficits: Secondary | ICD-10-CM | POA: Diagnosis not present

## 2022-02-14 DIAGNOSIS — E1142 Type 2 diabetes mellitus with diabetic polyneuropathy: Secondary | ICD-10-CM | POA: Diagnosis not present

## 2022-02-14 DIAGNOSIS — D72829 Elevated white blood cell count, unspecified: Secondary | ICD-10-CM | POA: Diagnosis not present

## 2022-02-14 DIAGNOSIS — F32A Depression, unspecified: Secondary | ICD-10-CM | POA: Diagnosis not present

## 2022-02-14 DIAGNOSIS — M6259 Muscle wasting and atrophy, not elsewhere classified, multiple sites: Secondary | ICD-10-CM | POA: Diagnosis not present

## 2022-02-14 DIAGNOSIS — C3490 Malignant neoplasm of unspecified part of unspecified bronchus or lung: Secondary | ICD-10-CM | POA: Diagnosis not present

## 2022-02-14 DIAGNOSIS — G4730 Sleep apnea, unspecified: Secondary | ICD-10-CM | POA: Diagnosis not present

## 2022-02-14 DIAGNOSIS — Z7401 Bed confinement status: Secondary | ICD-10-CM | POA: Diagnosis not present

## 2022-02-14 DIAGNOSIS — I10 Essential (primary) hypertension: Secondary | ICD-10-CM | POA: Diagnosis not present

## 2022-02-14 LAB — GLUCOSE, CAPILLARY
Glucose-Capillary: 109 mg/dL — ABNORMAL HIGH (ref 70–99)
Glucose-Capillary: 164 mg/dL — ABNORMAL HIGH (ref 70–99)

## 2022-02-14 MED ORDER — HYDROCODONE-ACETAMINOPHEN 5-325 MG PO TABS: 1.0000 | ORAL_TABLET | Freq: Two times a day (BID) | ORAL | 0 refills | Status: DC | PRN

## 2022-02-14 NOTE — Care Management Important Message (Signed)
Important Message  Patient Details  Name: Shawn Lindsey MRN: [id number] Date of Birth: Sep 30, 1950   Medicare Important Message Given:  Yes     Dannette Barbara 02/14/2022, 12:55 PM

## 2022-02-14 NOTE — Progress Notes (Signed)
Occupational Therapy Treatment Patient Details Name: Shawn Lindsey MRN: [id number] DOB: 24-Jan-1951 Today's Date: 02/14/2022   History of present illness Anselm Aumiller is a 22yoM who comes to [facility] on 02/[facility]/22 [facility] with SOB. PMH: tobacco use, COPD, hypoTSH, depression, GAD, diabetic neuropathy, HTN, HLD. RUL mass. POC includes saturation goal >92%. Workup showing ulmoary effusion, pt now s/p thoracentesis 1.5L 10/9.   OT comments  Upon entering session, pt resting in bed with girlfriend present. Both agreeable to OT. Tx session targeted improving tolerance for functional mobility in the setting of ADL tasks. Pt completed supine to sit and stood from EOB with Min A. Pt deferred grooming tasks this date, however, agreeable to complete functional mobility in the room. He required Min guard for dynamic standing balance while using RW for BUE support. Pt was motivated to participate in therapy. Pt is making progress toward goal completion. D/C recommendation remains appropriate. OT will continue to follow acutely.    Recommendations for follow up therapy are one component of a multi-disciplinary discharge planning process, led by the attending physician.  Recommendations may be updated based on patient status, additional functional criteria and insurance authorization.    Follow Up Recommendations  Skilled nursing-short term rehab (<3 hours/day)    Assistance Recommended at Discharge Set up Supervision/Assistance  Patient can return home with the following  Assist for transportation;Help with stairs or ramp for entrance;Assistance with cooking/housework;A little help with walking and/or transfers;A lot of help with bathing/dressing/bathroom   Equipment Recommendations  Other (comment) (defer to next venue of care)    Recommendations for Other Services      Precautions / Restrictions Precautions Precautions: Fall Restrictions Weight Bearing Restrictions: No       Mobility Bed  Mobility Overal bed mobility: Needs Assistance Bed Mobility: Supine to Sit     Supine to sit: Min assist, HOB elevated (for trunk elevation from flat bed)          Transfers Overall transfer level: Needs assistance Equipment used: Rolling walker (2 wheels) Transfers: Sit to/from Stand Sit to Stand: Min assist                 Balance Overall balance assessment: Needs assistance Sitting-balance support: Feet supported Sitting balance-Leahy Scale: Good     Standing balance support: Bilateral upper extremity supported Standing balance-Leahy Scale: Fair Standing balance comment: UE support on RW                           ADL either performed or assessed with clinical judgement   ADL Overall ADL's : Needs assistance/impaired                                     Functional mobility during ADLs: Min guard;Rolling walker (2 wheels) (2 laps in room) General ADL Comments: Pt deferring grooming tasks this date    Extremity/Trunk Assessment Upper Extremity Assessment Upper Extremity Assessment: Generalized weakness   Lower Extremity Assessment Lower Extremity Assessment: Generalized weakness   Cervical / Trunk Assessment Cervical / Trunk Assessment: Kyphotic    Vision Baseline Vision/History: 1 Wears glasses (readers only) Patient Visual Report: No change from baseline     Perception     Praxis      Cognition Arousal/Alertness: Awake/alert Behavior During Therapy: WFL for tasks assessed/performed Overall Cognitive Status: Within Functional Limits for tasks assessed  Exercises      Shoulder Instructions       General Comments on 3L O2 throughout - SpO2 read 91% on pulse ox, 94% on portable monitor. Educated on pursed lip breathing and energy conservation techniques.    Pertinent Vitals/ Pain       Pain Assessment Pain Assessment: Faces Faces Pain Scale: Hurts a little  bit Pain Location: back of head (pt reporting having cancer and scan was done to see if it's spreading) Pain Descriptors / Indicators: Discomfort Pain Intervention(s): Monitored during session  Home Living                                          Prior Functioning/Environment              Frequency  Min 2X/week        Progress Toward Goals  OT Goals(current goals can now be found in the care plan section)  Progress towards OT goals: Progressing toward goals  Acute Rehab OT Goals Patient Stated Goal: get stronger OT Goal Formulation: With patient Time For Goal Achievement: 02/23/22 Potential to Achieve Goals: Good  Plan Discharge plan remains appropriate;Frequency remains appropriate    Co-evaluation                 AM-PAC OT "6 Clicks" Daily Activity     Outcome Measure   Help from another person eating meals?: None Help from another person taking care of personal grooming?: A Little Help from another person toileting, which includes using toliet, bedpan, or urinal?: A Lot Help from another person bathing (including washing, rinsing, drying)?: A Lot Help from another person to put on and taking off regular upper body clothing?: A Little Help from another person to put on and taking off regular lower body clothing?: A Lot 6 Click Score: 16    End of Session Equipment Utilized During Treatment: Rolling walker (2 wheels);Oxygen (3L O2 via [place])  OT Visit Diagnosis: Unsteadiness on feet (R26.81);Muscle weakness (generalized) (M62.81);Pain   Activity Tolerance Patient tolerated treatment well   Patient Left in chair;with call bell/phone within reach;with chair alarm set;with family/visitor present   Nurse Communication Mobility status        Time: 9485-4627 OT Time Calculation (min): 18 min  Charges: OT General Charges $OT Visit: 1 Visit OT Treatments $Self Care/Home Management : 8-22 mins  Sanford [place] MS, OTR/L ascom  [phone]  02/14/22, 1:37 PM

## 2022-02-14 NOTE — Discharge Summary (Signed)
Shawn Lindsey BOF:[id number] DOB: 07-28-1950 DOA: 02/05/2022  PCP: Leonel Ramsay, MD  Admit date: 02/05/2022 Discharge date: 02/14/2022  Time spent: 35 minutes  Recommendations for Outpatient Follow-up:  Close oncology f/u     Discharge Diagnoses:  Principal Problem:   Acute on chronic respiratory failure with hypoxia ([place]) Active Problems:   HTN (hypertension)   COPD (chronic obstructive pulmonary disease) ([place])   Hyperlipidemia   Mass of upper lobe of right lung   Type 2 diabetes mellitus with diabetic polyneuropathy, with long-term current use of insulin ([facility])   History of Klebsiella pneumonia   Multifocal pneumonia   Back pain   Obesity, Class III, BMI 40-49.9 (morbid obesity) ([place])   Pleural effusion on right   Anxiety about health   Seizure disorder [facility])   Discharge Condition: stable  Diet recommendation: heart healthy  Filed Weights   02/05/22 1435  Weight: (!) 137.4 kg    History of present illness:  From admissin h and p [name] is a 71 year old male with history of pulmonary lesion concerning for adenocarcinoma, tobacco use, COPD, hypothyroid, depression, anxiety, neuropathy, hypertension, hyperlipidemia, who presents emergency department for chief concerns of shortness of breath.   At bedside, he is able to tell me his name, age, current lcoation and year. He states that last 2-3 weeks, he has been having worsening shortness of breath, going from room to room. Per patient, his spO2 was 79 on 3 L  per home measurement by a family RN.   He denies fever. He endorses cough with exertion, that is productive of brown phelm.    He denies dysuria, hematuria, diarrhea. He endorses right back mid thoracic pain states that this started a few days ago.  He denies trauma to his person.  He reports the pain is worse with palpation and is positional.    Hospital Course:  Patient presented with acute hypoxic respiratory failure. Required high flow o2  weaned to 3 L. Was treated for possible CAP with a 7 day course of ceftriaxone/azithromycin. Analysis of pleural effusion reveals adenocarcinoma. CT head (not able to hold still long enough for MRI) shows no signs of mets. Oncology consulted, plan for close outpatient f/u. Discharged to SNF.   Procedures: Thoracentesis 02/07/22  Consultations: Oncology, pulmonology  Discharge Exam: Vitals:   02/14/22 0857 02/14/22 1137  BP:  (!) 150/95  Pulse: 66 89  Resp: 18   Temp:  97.8 F (36.6 C)  SpO2: 92% 93%    General exam: awake, alert, no acute distress, obese HEENT: heated HFNC in place, moist mucus membranes, hearing grossly normal  Respiratory system: decreased breath sounds on right, left few scattered wheeze,  speaking in complete sentences Cardiovascular system: normal S1/S2, RRR Gastrointestinal system: soft, NT, ND Central nervous system: A&O x4. no gross focal neurologic deficits, normal speech Extremities: no lower extremity edema, lower extremity venous stasis, normal tone Skin: dry, intact, normal temperature Psychiatry: normal mood, congruent affect, judgement and insight appear normal  Discharge Instructions   Discharge Instructions     Diet - low sodium heart healthy   Complete by: As directed    Increase activity slowly   Complete by: As directed       Allergies as of 02/14/2022   No Known Allergies      Medication List     STOP taking these medications    ibuprofen 200 MG tablet Commonly known as: ADVIL       TAKE these medications  amLODipine 5 MG tablet Commonly known as: NORVASC Take 5 mg by mouth daily.   atorvastatin 80 MG tablet Commonly known as: LIPITOR Take 80 mg by mouth daily at 6 PM.   azithromycin 250 MG tablet Commonly known as: ZITHROMAX TAKE 1 TABLET BY MOUTH 3 TIMES A WEEK TAKE ON MONDAY  WEDNESDAY  AND FRIDAY   Breztri Aerosphere 160-9-4.8 MCG/ACT Aero Generic drug: Budeson-Glycopyrrol-Formoterol INHALE 2 PUFFS  INTO THE LUNGS ONCE EVERYMORNING AND AT BEDTIME   FLUoxetine 10 MG capsule Commonly known as: PROZAC Take 10 mg by mouth every evening.   gabapentin 600 MG tablet Commonly known as: NEURONTIN Take 1 tablet (600 mg total) by mouth 2 (two) times daily.   HYDROcodone-acetaminophen 5-325 MG tablet Commonly known as: NORCO/VICODIN Take 1 tablet by mouth every 12 (twelve) hours as needed for moderate pain or severe pain.   levETIRAcetam 500 MG tablet Commonly known as: KEPPRA Take 500 mg by mouth 2 (two) times daily.   levothyroxine 112 MCG tablet Commonly known as: SYNTHROID Take 112 mcg by mouth daily.   meloxicam 15 MG tablet Commonly known as: MOBIC Take 1 tablet (15 mg total) by mouth daily. Take with largest meal of the day. What changed: when to take this   multivitamin tablet Take 1 tablet by mouth daily.   omeprazole 40 MG capsule Commonly known as: PRILOSEC Take 40 mg by mouth 2 (two) times daily.   Ventolin HFA 108 (90 Base) MCG/ACT inhaler Generic drug: albuterol Inhale 1-2 puffs into the lungs every 6 (six) hours as needed (for shortness of breath/wheezing.).   albuterol (2.5 MG/3ML) 0.083% nebulizer solution Commonly known as: PROVENTIL Take 3 mLs (2.5 mg total) by nebulization every 6 (six) hours as needed for wheezing or shortness of breath.       No Known Allergies  Contact information for follow-up providers     Lloyd Huger, MD Follow up.   Specialty: Oncology Contact information: [place] [address] [phone]         Leonel Ramsay, MD Follow up.   Specialty: Infectious Diseases Contact information: [place] [address] [phone]              Contact information for after-discharge care     Destination     [facility] Preferred SNF .   Service: Skilled Nursing Contact information: [address] [place] [phone]                      The results of significant diagnostics from this hospitalization (including imaging, microbiology, ancillary and laboratory) are listed below for reference.    Significant Diagnostic Studies: CT HEAD W & WO CONTRAST (5MM)  Result Date: 02/11/2022 CLINICAL DATA:  Pulmonary lesion concerning for adenocarcinoma concern for metastatic disease. EXAM: CT HEAD WITHOUT AND WITH CONTRAST TECHNIQUE: Contiguous axial images were obtained from the base of the skull through the vertex without and with intravenous contrast. RADIATION DOSE REDUCTION: This exam was performed according to the departmental dose-optimization program which includes automated exposure control, adjustment of the mA and/or kV according to patient size and/or use of iterative reconstruction technique. CONTRAST:  59mL OMNIPAQUE IOHEXOL 300 MG/ML  SOLN COMPARISON:  CT head 07/07/2019 FINDINGS: Brain: There is no acute intracranial hemorrhage, extra-axial fluid collection, or acute infarct. There is background parenchymal volume loss with prominence of the ventricular system and extra-axial CSF spaces. There is a remote  infarct in the right PCA distribution with associated ex vacuo dilatation of the right lateral ventricle, unchanged. There are remote lacunar infarcts in the right lentiform nucleus and left caudate body, unchanged. Additional patchy hypodensity in the supratentorial white matter likely reflects sequela of underlying chronic small vessel ischemic change. There is no mass lesion or abnormal enhancement. There is no mass effect or midline shift. Vascular: There is calcification of the bilateral carotid siphons. Skull: Normal. Negative for fracture or focal lesion. Sinuses/Orbits: There is mucosal thickening in the left maxillary sinus. Globes and orbits are unremarkable. Other: There is a trace right mastoid effusion. Imaged right nasopharynx is unremarkable. IMPRESSION: No evidence  of intracranial metastatic disease. Electronically Signed   By: Valetta Mole M.D.   On: 02/11/2022 16:47   ECHOCARDIOGRAM COMPLETE  Result Date: 02/08/2022    ECHOCARDIOGRAM REPORT   Patient Name:   Oswaldo A Quade Date of Exam: 02/08/2022 Medical Rec #:  [id number]      Height:       70.0 in Accession #:    [id number]     Weight:       303.0 lb Date of Birth:  Sep 08, 1950      BSA:          2.490 m Patient Age:    49 years       BP:           121/61 mmHg Patient Gender: M              HR:           85 bpm. Exam Location:  [facility] Procedure: 2D Echo, Cardiac Doppler and Color Doppler Indications:     I50.31 CHF Acute Diastolic  History:         Patient has no prior history of Echocardiogram examinations.                  COPD; Risk Factors:Current Smoker, Hypertension, Dyslipidemia                  and Diabetes.  Sonographer:     Rosalia Hammers Referring Phys:  [id number] CARMEN L GONZALEZ Diagnosing Phys: Yolonda Kida MD  Sonographer Comments: Technically difficult study due to poor echo windows. Image acquisition challenging due to patient body habitus, Image acquisition challenging due to respiratory motion and Image acquisition challenging due to COPD. IMPRESSIONS  1. Left ventricular ejection fraction, by estimation, is 45 to 50%. The left ventricle has mildly decreased function. The left ventricle has no regional wall motion abnormalities. Left ventricular diastolic parameters are consistent with Grade I diastolic dysfunction (impaired relaxation).  2. Right ventricular systolic function is normal. The right ventricular size is normal.  3. The mitral valve is normal in structure. Trivial mitral valve regurgitation.  4. The aortic valve is normal in structure. Aortic valve regurgitation is not visualized. Aortic valve sclerosis is present, with no evidence of aortic valve stenosis. FINDINGS  Left Ventricle: Left ventricular ejection fraction, by estimation, is 45 to 50%. The left ventricle has mildly decreased  function. The left ventricle has no regional wall motion abnormalities. The left ventricular internal cavity size was normal in size. There is no left ventricular hypertrophy. Left ventricular diastolic parameters are consistent with Grade I diastolic dysfunction (impaired relaxation). Right Ventricle: The right ventricular size is normal. No increase in right ventricular wall thickness. Right ventricular systolic function is normal. Left Atrium: Left atrial size was normal in size. Right Atrium: Right atrial size  was normal in size. Pericardium: There is no evidence of pericardial effusion. Mitral Valve: The mitral valve is normal in structure. Trivial mitral valve regurgitation. Tricuspid Valve: The tricuspid valve is normal in structure. Tricuspid valve regurgitation is trivial. Aortic Valve: The aortic valve is normal in structure. Aortic valve regurgitation is not visualized. Aortic valve sclerosis is present, with no evidence of aortic valve stenosis. Aortic valve mean gradient measures 7.0 mmHg. Aortic valve peak gradient measures 12.5 mmHg. Aortic valve area, by VTI measures 2.74 cm. Pulmonic Valve: The pulmonic valve was normal in structure. Pulmonic valve regurgitation is not visualized. Aorta: The ascending aorta was not well visualized. IAS/Shunts: No atrial level shunt detected by color flow Doppler.  LEFT VENTRICLE PLAX 2D LVIDd:         5.00 cm   Diastology LVIDs:         3.90 cm   LV e' medial:    18.90 cm/s LV PW:         1.10 cm   LV E/e' medial:  6.3 LV IVS:        1.10 cm   LV e' lateral:   10.30 cm/s LVOT diam:     2.50 cm   LV E/e' lateral: 11.7 LV SV:         91 LV SV Index:   37 LVOT Area:     4.91 cm  RIGHT VENTRICLE RV Basal diam:  4.10 cm RV Mid diam:    2.80 cm RV S prime:     13.80 cm/s TAPSE (M-mode): 1.9 cm LEFT ATRIUM             Index        RIGHT ATRIUM           Index LA diam:        3.20 cm 1.28 cm/m   RA Area:     14.90 cm LA Vol (A2C):   48.8 ml 19.60 ml/m  RA Volume:    36.40 ml  14.62 ml/m LA Vol (A4C):   65.2 ml 26.18 ml/m LA Biplane Vol: 58.2 ml 23.37 ml/m  AORTIC VALVE AV Area (Vmax):    2.86 cm AV Area (Vmean):   2.75 cm AV Area (VTI):     2.74 cm AV Vmax:           177.00 cm/s AV Vmean:          118.000 cm/s AV VTI:            0.333 m AV Peak Grad:      12.5 mmHg AV Mean Grad:      7.0 mmHg LVOT Vmax:         103.00 cm/s LVOT Vmean:        66.200 cm/s LVOT VTI:          0.186 m LVOT/AV VTI ratio: 0.56  AORTA Ao Root diam: 4.20 cm MITRAL VALVE MV Area (PHT): 3.50 cm     SHUNTS MV Decel Time: 217 msec     Systemic VTI:  0.19 m MV E velocity: 120.00 cm/s  Systemic Diam: 2.50 cm MV A velocity: 122.00 cm/s MV E/A ratio:  0.98 Dwayne D Callwood MD Electronically signed by Yolonda Kida MD Signature Date/Time: 02/08/2022/5:03:18 PM    Final    DG Chest 2 View  Result Date: 02/08/2022 CLINICAL DATA:  Pleural effusion EXAM: CHEST - 2 VIEW COMPARISON:  Chest x-ray dated February 07, 2022 FINDINGS: Cardiac and mediastinal contours are unchanged.  Volume loss of the right hemithorax with persistent moderate loculated right pleural effusion and bilateral patchy airspace opacities. No evidence of pneumothorax. IMPRESSION: Unchanged moderate loculated right pleural effusion and bilateral patchy airspace opacities. Electronically Signed   By: Yetta Glassman M.D.   On: 02/08/2022 13:44   DG Chest Port 1 View  Result Date: 02/07/2022 CLINICAL DATA:  Status post right thoracentesis. EXAM: PORTABLE CHEST 1 VIEW COMPARISON:  02/05/2022 FINDINGS: Status post right thoracentesis with slight improved aeration of the residual right lung. No pneumothorax. Stable appearance of left lung. IMPRESSION: Status post right thoracentesis with slight improved aeration of the residual right lung. No pneumothorax. Electronically Signed   By: Aletta Edouard M.D.   On: 02/07/2022 12:04   US THORACENTESIS ASP PLEURAL SPACE W/IMG GUIDE  Result Date: 02/07/2022 INDICATION: 71 year old male  inpatient smoker. History of COPD, diabetes, hypertension, morbid obesity, CVA. With a known right upper lobe lung mass. Admitted for acute on chronic respiratory failure found to have a right-sided pleural effusion. Request is for therapeutic and diagnostic right-sided thoracentesis EXAM: ULTRASOUND GUIDED THERAPEUTIC AND DIAGNOSTIC RIGHT-SIDED THORACENTESIS MEDICATIONS: LIDOCAINE 1% 10 ML COMPLICATIONS: None immediate. PROCEDURE: An ultrasound guided thoracentesis was thoroughly discussed with the patient and questions answered. The benefits, risks, alternatives and complications were also discussed. The patient understands and wishes to proceed with the procedure. Written consent was obtained. Ultrasound was performed to localize and mark an adequate pocket of fluid in the right chest. The area was then prepped and draped in the normal sterile fashion. 1% Lidocaine was used for local anesthesia. Under ultrasound guidance a 6 Fr Safe-T-Centesis catheter was introduced. Thoracentesis was performed. The catheter was removed and a dressing applied. FINDINGS: A total of approximately 1.5 L of straw-colored fluid was removed. Samples were sent to the laboratory as requested by the clinical team. IMPRESSION: Successful ultrasound guided therapeutic and diagnostic right-sided thoracentesis yielding of pleural fluid. Read by: Rushie Nyhan, NP Electronically Signed   By: Corrie Mckusick D.O.   On: 02/07/2022 12:02   CT Angio Chest PE W and/or Wo Contrast  Result Date: 02/05/2022 CLINICAL DATA:  Increased shortness of breath. Smoker. Reports right upper lung collapse for 3 weeks. EXAM: CT ANGIOGRAPHY CHEST WITH CONTRAST TECHNIQUE: Multidetector CT imaging of the chest was performed using the standard protocol during bolus administration of intravenous contrast. Multiplanar CT image reconstructions and MIPs were obtained to evaluate the vascular anatomy. RADIATION DOSE REDUCTION: This exam was performed according to  the departmental dose-optimization program which includes automated exposure control, adjustment of the mA and/or kV according to patient size and/or use of iterative reconstruction technique. CONTRAST:  171mL OMNIPAQUE IOHEXOL 350 MG/ML SOLN COMPARISON:  Current chest radiograph.  Chest CT, 12/21/2021. FINDINGS: Cardiovascular: Pulmonary arteries are well opacified. Respiratory motion mildly limits assessment of the smaller segmental/subsegmental vessels, particularly to the left lower lobe. Allowing for this, there is no evidence of a pulmonary embolism. Heart is normal in size. Left and circumflex coronary artery calcifications. No pericardial effusion. Aorta normal in caliber. Aortic atherosclerosis. No dissection. Mediastinum/Nodes: Mild mediastinal adenopathy. Precarinal node measures 1.7 cm short axis. Subcarinal node measures 1.7 cm in short axis. Enlarged left hilar node, 1.4 cm in short axis. No mediastinal or hilar masses. Trachea is partly collapse, exam appears extra tori. Trachea otherwise unremarkable. Normal esophagus. Lungs/Pleura: Large moderate right pleural effusion. Right upper lobe bronchus is occluded. There is surrounding soft tissue, contiguous with the partly, which could reflect a mass, approximately 4 x 3.4 cm  transversely. There is patchy airspace consolidation in the right lower lobe collapsed right upper lobe. Right middle lobe is collapsed. Additional patchy opacities noted in the right upper lobe. Patchy areas of ground-glass opacity are noted in the left lower lobe and anteromedial left upper lobe. 8 mm nodule, subpleural, left lower lobe, image 114/5. Moderate centrilobular emphysema. No left pleural effusion. No pneumothorax. Upper Abdomen: No acute findings. No visualized liver or adrenal mass. Dependent gallstone. Musculoskeletal: No fracture or acute finding. No bone lesion. No chest wall mass. Review of the MIP images confirms the above findings. IMPRESSION: 1. No evidence of  a pulmonary embolism. 2. Possible central mass versus collapsed lung along the superior right hilum, where the right upper lobe bronchus is occluded. 3. Patchy areas of right upper and lower lobe opacity consistent with multifocal pneumonia. 4. Moderate right pleural effusion. 5. Patchy ground-glass type opacities in the left upper and left lower lobe may reflect additional areas of infection, but are similar to the prior CT, and may be chronic. 6. 8 mm left lower lobe nodule. 7. Mediastinal and left hilar lymphadenopathy unchanged from the prior CT. 8. Coronary artery calcifications and aortic atherosclerosis. Aortic Atherosclerosis (ICD10-I70.0) and Emphysema (ICD10-J43.9). Electronically Signed   By: Lajean Manes M.D.   On: 02/05/2022 16:41   DG Chest Portable 1 View  Result Date: 02/05/2022 CLINICAL DATA:  Increasing shortness of breath EXAM: PORTABLE CHEST 1 VIEW COMPARISON:  Previous chest radiographs done on 08/02/2021 and CT chest done on 12/21/2021 FINDINGS: Transverse diameter of heart is increased. There is slight prominence of interstitial markings in left lung. There is decreased volume in right lung. There is pleural density in the periphery of right lung. There is homogeneous opacity in right upper and right lower lung fields. Left lateral CP angle is clear. There is no pneumothorax. IMPRESSION: There is interval decrease in volume in right lung along with increasing amount of right pleural effusion. Part of the increased opacity in right lung may be due to underlying neoplastic process and pneumonia. Prominence of interstitial markings in left lung may suggest interstitial edema or interstitial pneumonia. Electronically Signed   By: Elmer Picker M.D.   On: 02/05/2022 14:57    Microbiology: Recent Results (from the past 240 hour(s))  SARS Coronavirus 2 by RT PCR (hospital order, performed in [facility] hospital lab) *cepheid single result test* Anterior Nasal Swab     Status: None    Collection Time: 02/05/22  2:41 PM   Specimen: Anterior Nasal Swab  Result Value Ref Range Status   SARS Coronavirus 2 by RT PCR NEGATIVE NEGATIVE Final    Comment: (NOTE) SARS-CoV-2 target nucleic acids are NOT DETECTED.  The SARS-CoV-2 RNA is generally detectable in upper and lower respiratory specimens during the acute phase of infection. The lowest concentration of SARS-CoV-2 viral copies this assay can detect is 250 copies / mL. A negative result does not preclude SARS-CoV-2 infection and should not be used as the sole basis for treatment or other patient management decisions.  A negative result may occur with improper specimen collection / handling, submission of specimen other than nasopharyngeal swab, presence of viral mutation(s) within the areas targeted by this assay, and inadequate number of viral copies (<250 copies / mL). A negative result must be combined with clinical observations, patient history, and epidemiological information.  Fact Sheet for Patients:   https://www.patel.info/  Fact Sheet for Healthcare Providers: https://hall.com/  This test is not yet approved or  cleared by  the [employer] and has been authorized for detection and/or diagnosis of SARS-CoV-2 by FDA under an Emergency Use Authorization (EUA).  This EUA will remain in effect (meaning this test can be used) for the duration of the COVID-19 declaration under Section 564(b)(1) of the Act, 21 U.S.C. section 360bbb-3(b)(1), unless the authorization is terminated or revoked sooner.  Performed at [facility], [address] [place], [place] [address]   Blood culture (routine x 2)     Status: None   Collection Time: 02/05/22  5:27 PM   Specimen: BLOOD  Result Value Ref Range Status   Specimen Description BLOOD LEFT ANTECUBITAL  Final   Special Requests   Final    BOTTLES DRAWN AEROBIC AND ANAEROBIC Blood Culture results may not be  optimal due to an excessive volume of blood received in culture bottles   Culture   Final    NO GROWTH 5 DAYS Performed at [facility], [address], [place], [place] [address]    Report Status 02/10/2022 FINAL  Final  Blood culture (routine x 2)     Status: None   Collection Time: 02/05/22  5:32 PM   Specimen: BLOOD  Result Value Ref Range Status   Specimen Description BLOOD BLOOD RIGHT HAND  Final   Special Requests   Final    BOTTLES DRAWN AEROBIC AND ANAEROBIC Blood Culture results may not be optimal due to an excessive volume of blood received in culture bottles   Culture   Final    NO GROWTH 5 DAYS Performed at [facility], [address], [place], [place] [address]    Report Status 02/10/2022 FINAL  Final  MRSA Next Gen by PCR, Nasal     Status: None   Collection Time: 02/05/22  6:45 PM   Specimen: Nasal Mucosa; Nasal Swab  Result Value Ref Range Status   MRSA by PCR Next Gen NOT DETECTED NOT DETECTED Final    Comment: (NOTE) The GeneXpert MRSA Assay (FDA approved for NASAL specimens only), is one component of a comprehensive MRSA colonization surveillance program. It is not intended to diagnose MRSA infection nor to guide or monitor treatment for MRSA infections. Test performance is not FDA approved in patients less than 72 years old. Performed at [facility], [address] [place], [place] [address]   Fungus Culture With Stain     Status: None (Preliminary result)   Collection Time: 02/07/22 11:30 AM   Specimen: PATH Cytology Pleural fluid  Result Value Ref Range Status   Fungus Stain Final report  Final    Comment: (NOTE) Performed At: [facility] [place], [place] [id number] Rush Farmer MD [id number]    Fungus (Mycology) Culture PENDING  Incomplete   Fungal Source PLEURAL  Final    Comment: Performed at [facility], [address] [place], [place] [address]  Acid Fast Smear (AFB)      Status: None   Collection Time: 02/07/22 11:30 AM   Specimen: PATH Cytology Pleural fluid  Result Value Ref Range Status   AFB Specimen Processing Concentration  Final   Acid Fast Smear Negative  Final    Comment: (NOTE) Performed At: [facility] [place], [place] [id number] Rush Farmer MD [id number]    Source (AFB) PLEURAL  Final    Comment: Performed at [facility], [address] [place], [place] [address]  Body fluid culture w Gram Stain     Status: None  Collection Time: 02/07/22 11:30 AM   Specimen: PATH Cytology Pleural fluid  Result Value Ref Range Status   Specimen Description   Final    PLEURAL Performed at [facility], [address], [place], [place] [address]    Special Requests   Final    NONE Performed at [facility], [address] [place], [place] [address]    Gram Stain   Final    WBC PRESENT, PREDOMINANTLY MONONUCLEAR NO ORGANISMS SEEN CYTOSPIN SMEAR    Culture   Final    NO GROWTH 3 DAYS Performed at [facility], [place] [address], [place], [place] [address]    Report Status 02/11/2022 FINAL  Final  Fungus Culture Result     Status: None   Collection Time: 02/07/22 11:30 AM  Result Value Ref Range Status   Result 1 Comment  Final    Comment: (NOTE) KOH/Calcofluor preparation:  no fungus observed. Performed At: [facility] [place], [place] [id number] Rush Farmer MD [id number]   Expectorated Sputum Assessment w Gram Stain, Rflx to Resp Cult     Status: None   Collection Time: 02/09/22  1:27 PM   Specimen: Sputum  Result Value Ref Range Status   Specimen Description SPUTUM  Final   Special Requests NONE  Final   Sputum evaluation   Final    THIS SPECIMEN IS ACCEPTABLE FOR SPUTUM CULTURE Performed at [facility], [address] [place], [place] [address]    Report Status 02/09/2022 FINAL  Final  Respiratory (~20 pathogens) panel by PCR      Status: None   Collection Time: 02/09/22  1:27 PM   Specimen: Nasopharyngeal Swab; Respiratory  Result Value Ref Range Status   Adenovirus NOT DETECTED NOT DETECTED Final   Coronavirus 229E NOT DETECTED NOT DETECTED Final    Comment: (NOTE) The Coronavirus on the Respiratory Panel, DOES NOT test for the novel  Coronavirus (2019 nCoV)    Coronavirus HKU1 NOT DETECTED NOT DETECTED Final   Coronavirus NL63 NOT DETECTED NOT DETECTED Final   Coronavirus OC43 NOT DETECTED NOT DETECTED Final   Metapneumovirus NOT DETECTED NOT DETECTED Final   Rhinovirus / Enterovirus NOT DETECTED NOT DETECTED Final   Influenza A NOT DETECTED NOT DETECTED Final   Influenza B NOT DETECTED NOT DETECTED Final   Parainfluenza Virus 1 NOT DETECTED NOT DETECTED Final   Parainfluenza Virus 2 NOT DETECTED NOT DETECTED Final   Parainfluenza Virus 3 NOT DETECTED NOT DETECTED Final   Parainfluenza Virus 4 NOT DETECTED NOT DETECTED Final   Respiratory Syncytial Virus NOT DETECTED NOT DETECTED Final   Bordetella pertussis NOT DETECTED NOT DETECTED Final   Bordetella Parapertussis NOT DETECTED NOT DETECTED Final   Chlamydophila pneumoniae NOT DETECTED NOT DETECTED Final   Mycoplasma pneumoniae NOT DETECTED NOT DETECTED Final    Comment: Performed at [facility], [place] [address] [place], [place] [address]  Culture, Respiratory w Gram Stain     Status: None   Collection Time: 02/09/22  1:27 PM   Specimen: SPU  Result Value Ref Range Status   Specimen Description   Final    SPUTUM Performed at [facility] [address], [place], [place] [address]    Special Requests   Final    NONE Reflexed from [phone] Performed at [facility], [address] [place],  [address]    Gram Stain   Final    FEW SQUAMOUS EPITHELIAL CELLS PRESENT  FEW WBC PRESENT,BOTH PMN AND MONONUCLEAR FEW GRAM POSITIVE COCCI IN CLUSTERS RARE GRAM POSITIVE RODS    Culture   Final    FEW Consistent with normal  respiratory flora. No Pseudomonas species isolated Performed at [place] [facility] [address] [place], [place] [address]    Report Status 02/12/2022 FINAL  Final     Labs: Basic Metabolic Panel: Recent Labs  Lab 02/08/22 0456 02/10/22 0505 02/11/22 0638 02/12/22 0536  NA 140 140 144 141  K 5.0 4.4 5.0 3.7  CL 102 102 100 103  CO2 28 29 34* 32  GLUCOSE 130* 165* 116* 105*  BUN 25* 29* 32* 31*  CREATININE 1.14 0.92 1.19 1.12  CALCIUM 9.1 9.0 9.9 8.8*  MG  --  2.1 2.4  --   PHOS  --  3.3 3.4  --    Liver Function Tests: No results for input(s): "AST", "ALT", "ALKPHOS", "BILITOT", "PROT", "ALBUMIN" in the last 168 hours. No results for input(s): "LIPASE", "AMYLASE" in the last 168 hours. No results for input(s): "AMMONIA" in the last 168 hours. CBC: Recent Labs  Lab 02/08/22 0456 02/10/22 0505 02/11/22 0638 02/12/22 0536  WBC 16.8* 14.3* 13.7* 13.8*  HGB 13.6 14.8 16.6 14.5  HCT 44.0 46.2 52.7* 45.3  MCV 92.1 89.5 90.1 89.0  PLT 223 206 200 197   Cardiac Enzymes: No results for input(s): "CKTOTAL", "CKMB", "CKMBINDEX", "TROPONINI" in the last 168 hours. BNP: BNP (last 3 results) Recent Labs    02/05/22 1445  BNP 262.3*    ProBNP (last 3 results) No results for input(s): "PROBNP" in the last 8760 hours.  CBG: Recent Labs  Lab 02/13/22 1141 02/13/22 1540 02/13/22 2031 02/14/22 0753 02/14/22 1139  GLUCAP 229* 243* 193* 109* 164*       Signed:  Desma Maxim MD.  [facility] 02/14/2022, 12:32 PM

## 2022-02-14 NOTE — TOC Transition Note (Signed)
Transition of Care [facility]) - CM/SW Discharge Note   Patient Details  Name: Shawn Lindsey MRN: [id number] Date of Birth: [birth year]  Transition of [facility] CM/SW Contact:  Candie Chroman, LCSW Phone Number: 02/14/2022, 1:40 PM   Clinical Narrative:   Patient has orders to discharge to [facility] today. RN will call report to [phone] (Room 713). Significant other will bring his CPAP to the facility. EMS transport has been arranged and he is first on the list. No further concerns. CSW signing off.  Final next level of care: Skilled Nursing Facility Barriers to Discharge: Barriers Resolved   Patient Goals and CMS Choice Patient states their goals for this hospitalization and ongoing recovery are:: to go home CMS Medicare.gov Compare Post Acute Care list provided to:: Patient Choice offered to / list presented to : Patient  Discharge Placement   Existing PASRR number confirmed : 02/11/22          Patient chooses bed at: [facility]  Patient to be transferred to facility by: EMS Name of family member notified: Karren Cobble Patient and family notified of of transfer: 02/14/22  Discharge Plan and Services                                     Social Determinants of Health (SDOH) Interventions     Readmission Risk Interventions     No data to display

## 2022-02-15 DIAGNOSIS — E039 Hypothyroidism, unspecified: Secondary | ICD-10-CM | POA: Diagnosis not present

## 2022-02-15 DIAGNOSIS — G4730 Sleep apnea, unspecified: Secondary | ICD-10-CM | POA: Diagnosis not present

## 2022-02-15 DIAGNOSIS — J449 Chronic obstructive pulmonary disease, unspecified: Secondary | ICD-10-CM | POA: Diagnosis not present

## 2022-02-16 ENCOUNTER — Inpatient Hospital Stay: Payer: PPO | Admitting: Hospice and Palliative Medicine

## 2022-02-16 ENCOUNTER — Inpatient Hospital Stay: Payer: PPO | Attending: Oncology | Admitting: Oncology

## 2022-02-16 ENCOUNTER — Telehealth: Payer: Self-pay

## 2022-02-16 ENCOUNTER — Inpatient Hospital Stay (HOSPITAL_BASED_OUTPATIENT_CLINIC_OR_DEPARTMENT_OTHER): Payer: PPO | Admitting: Hospice and Palliative Medicine

## 2022-02-16 ENCOUNTER — Other Ambulatory Visit: Payer: Self-pay

## 2022-02-16 ENCOUNTER — Encounter: Payer: Self-pay | Admitting: Oncology

## 2022-02-16 ENCOUNTER — Ambulatory Visit
Admission: RE | Admit: 2022-02-16 | Discharge: 2022-02-16 | Disposition: A | Discharge status: Home / Self Care | Payer: PPO | Source: Ambulatory Visit | Attending: Radiation Oncology | Admitting: Radiation Oncology

## 2022-02-16 VITALS — BP 133/78 | HR 86 | Temp 98.0°F | Wt 303.0 lb

## 2022-02-16 DIAGNOSIS — F17210 Nicotine dependence, cigarettes, uncomplicated: Secondary | ICD-10-CM | POA: Insufficient documentation

## 2022-02-16 DIAGNOSIS — E039 Hypothyroidism, unspecified: Secondary | ICD-10-CM | POA: Diagnosis not present

## 2022-02-16 DIAGNOSIS — K219 Gastro-esophageal reflux disease without esophagitis: Secondary | ICD-10-CM | POA: Diagnosis not present

## 2022-02-16 DIAGNOSIS — I700 Atherosclerosis of aorta: Secondary | ICD-10-CM | POA: Insufficient documentation

## 2022-02-16 DIAGNOSIS — C3490 Malignant neoplasm of unspecified part of unspecified bronchus or lung: Secondary | ICD-10-CM | POA: Diagnosis not present

## 2022-02-16 DIAGNOSIS — E7800 Pure hypercholesterolemia, unspecified: Secondary | ICD-10-CM | POA: Insufficient documentation

## 2022-02-16 DIAGNOSIS — C3411 Malignant neoplasm of upper lobe, right bronchus or lung: Secondary | ICD-10-CM | POA: Diagnosis not present

## 2022-02-16 DIAGNOSIS — Z79899 Other long term (current) drug therapy: Secondary | ICD-10-CM | POA: Diagnosis not present

## 2022-02-16 DIAGNOSIS — G40909 Epilepsy, unspecified, not intractable, without status epilepticus: Secondary | ICD-10-CM | POA: Diagnosis not present

## 2022-02-16 DIAGNOSIS — Z515 Encounter for palliative care: Secondary | ICD-10-CM | POA: Diagnosis not present

## 2022-02-16 DIAGNOSIS — C3491 Malignant neoplasm of unspecified part of right bronchus or lung: Secondary | ICD-10-CM | POA: Insufficient documentation

## 2022-02-16 DIAGNOSIS — J90 Pleural effusion, not elsewhere classified: Secondary | ICD-10-CM | POA: Insufficient documentation

## 2022-02-16 DIAGNOSIS — R590 Localized enlarged lymph nodes: Secondary | ICD-10-CM | POA: Diagnosis not present

## 2022-02-16 DIAGNOSIS — J432 Centrilobular emphysema: Secondary | ICD-10-CM | POA: Diagnosis not present

## 2022-02-16 DIAGNOSIS — Z8673 Personal history of transient ischemic attack (TIA), and cerebral infarction without residual deficits: Secondary | ICD-10-CM | POA: Insufficient documentation

## 2022-02-16 DIAGNOSIS — E1142 Type 2 diabetes mellitus with diabetic polyneuropathy: Secondary | ICD-10-CM | POA: Diagnosis not present

## 2022-02-16 DIAGNOSIS — I2510 Atherosclerotic heart disease of native coronary artery without angina pectoris: Secondary | ICD-10-CM | POA: Diagnosis not present

## 2022-02-16 DIAGNOSIS — I110 Hypertensive heart disease with heart failure: Secondary | ICD-10-CM | POA: Insufficient documentation

## 2022-02-16 DIAGNOSIS — G4730 Sleep apnea, unspecified: Secondary | ICD-10-CM | POA: Insufficient documentation

## 2022-02-16 DIAGNOSIS — Z9981 Dependence on supplemental oxygen: Secondary | ICD-10-CM | POA: Diagnosis not present

## 2022-02-16 DIAGNOSIS — Z85818 Personal history of malignant neoplasm of other sites of lip, oral cavity, and pharynx: Secondary | ICD-10-CM | POA: Diagnosis not present

## 2022-02-16 LAB — BLOOD GAS, VENOUS
Acid-Base Excess: 8.7 mmol/L — ABNORMAL HIGH (ref 0.0–2.0)
Bicarbonate: 36.1 mmol/L — ABNORMAL HIGH (ref 20.0–28.0)
Patient temperature: 37
pCO2, Ven: 61 mmHg — ABNORMAL HIGH (ref 44–60)
pH, Ven: 7.38 (ref 7.25–7.43)

## 2022-02-16 MED ORDER — OXYCODONE HCL 10 MG PO TABS: 10.0000 mg | ORAL_TABLET | Freq: Four times a day (QID) | ORAL | 0 refills | Status: AC

## 2022-02-16 NOTE — Progress Notes (Signed)
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

## 2022-02-16 NOTE — Progress Notes (Signed)
[place] at [facility] [phone] [phone]   Name: Shawn Lindsey Date: 02/16/2022 MRN: [id number]  DOB: 04/05/1951  Patient Care Team: Leonel Ramsay, MD as PCP - General (Infectious Diseases) Flora Lipps, MD as Consulting Physician (Pulmonary Disease)    REASON FOR CONSULTATION: Shawn Lindsey is a 71 y.o. male with multiple medical problems including COPD on home O2, diabetes with peripheral neuropathy, seizure disorder, depression/anxiety.  Patient was hospitalized 02/05/2022 to 02/14/2022 with hypoxic respiratory failure and was treated for possible CAP.  CTA of the chest showed large right pleural effusion with occluded right upper lobe bronchus and contiguous soft tissue mass.  Patient underwent thoracentesis with cytology + adenocarcinoma.  Palliative care was consulted to address goals.  SOCIAL HISTORY:     reports that he has been smoking cigarettes. He has a 60.00 pack-year smoking history. He has never used smokeless tobacco. He reports that he does not drink alcohol and does not use drugs.  Patient is twice divorced.  He has a long-term significant other, [name], who has lived with for 18 years.  Patient has 2 biological children.  He previously worked as a Tax adviser.  ADVANCE DIRECTIVES:  Does not have  CODE STATUS: Full code  PAST MEDICAL HISTORY: Past Medical History:  Diagnosis Date   Anxiety    mild   Cancer ([place]) 2015   tonsil   COPD (chronic obstructive pulmonary disease) (HCC)    Diabetes mellitus without complication ([place])    controlled with diet   Elevated cholesterol    GERD (gastroesophageal reflux disease)    History of kidney stones    Hypertension    Hypothyroidism    Morbid obesity with BMI of 40.0-44.9, adult ([place])    Pneumonia    klebsiella pneumoniae; extended stay in hospital requiring tracheotomy and gastrotomy   Seizures ([place]) 2015   related to sepsis    Septic shock ([place])    Sleep apnea 2019   has a Cpap machime    Stroke ([place]) 2007   no peripheral vision on left side of both eyes & left arm gets numb under stress    PAST SURGICAL HISTORY:  Past Surgical History:  Procedure Laterality Date   CYSTOSCOPY W/ RETROGRADES Right 07/23/2019   Procedure: CYSTOSCOPY WITH RETROGRADE PYELOGRAM;  Surgeon: Abbie Sons, MD;  Location: [facility];  Service: Urology;  Laterality: Right;   CYSTOSCOPY WITH STENT PLACEMENT Right 07/05/2019   Procedure: CYSTOSCOPY WITH STENT PLACEMENT;  Surgeon: Abbie Sons, MD;  Location: [facility];  Service: Urology;  Laterality: Right;   CYSTOSCOPY/URETEROSCOPY/HOLMIUM LASER/STENT PLACEMENT Right 07/23/2019   Procedure: CYSTOSCOPY/URETEROSCOPY/HOLMIUM LASER/STENT Exchange;  Surgeon: Abbie Sons, MD;  Location: [facility];  Service: Urology;  Laterality: Right;   CYSTOSCOPY/URETEROSCOPY/HOLMIUM LASER/STENT PLACEMENT Right 07/30/2019   Procedure: CYSTOSCOPY/URETEROSCOPY/HOLMIUM LASER/STENT Exchange;  Surgeon: Abbie Sons, MD;  Location: [facility];  Service: Urology;  Laterality: Right;   ELECTROMAGNETIC NAVIGATION BROCHOSCOPY N/A 03/09/2016   Procedure: ELECTROMAGNETIC NAVIGATION BRONCHOSCOPY;  Surgeon: Flora Lipps, MD;  Location: [facility];  Service: Cardiopulmonary;  Laterality: N/A;   GASTROSTOMY TUBE PLACEMENT     REMOVAL OF GASTROSTOMY TUBE     TONSILLECTOMY  2015   TRACHEOSTOMY     VIDEO BRONCHOSCOPY WITH ENDOBRONCHIAL NAVIGATION N/A 06/08/2020   Procedure: VIDEO BRONCHOSCOPY WITH ENDOBRONCHIAL NAVIGATION;  Surgeon: Tyler Pita, MD;  Location: [facility];  Service: Pulmonary;  Laterality: N/A;   VIDEO BRONCHOSCOPY WITH ENDOBRONCHIAL ULTRASOUND N/A  06/08/2020   Procedure: VIDEO BRONCHOSCOPY WITH ENDOBRONCHIAL ULTRASOUND;  Surgeon: Tyler Pita, MD;  Location: [facility];  Service: Pulmonary;  Laterality: N/A;   VIDEO BRONCHOSCOPY WITH ENDOBRONCHIAL ULTRASOUND Right 08/02/2021   Procedure: BRONCHOSCOPY WITH  ENDOBRONCHIAL ULTRASOUND;  Surgeon: Tyler Pita, MD;  Location: [facility];  Service: Pulmonary;  Laterality: Right;    HEMATOLOGY/ONCOLOGY HISTORY:  Oncology History   No history exists.    ALLERGIES:  has No Known Allergies.  MEDICATIONS:  Current Outpatient Medications  Medication Sig Dispense Refill   albuterol (PROVENTIL) (2.5 MG/3ML) 0.083% nebulizer solution Take 3 mLs (2.5 mg total) by nebulization every 6 (six) hours as needed for wheezing or shortness of breath. 125 mL 12   amLODipine (NORVASC) 5 MG tablet Take 5 mg by mouth daily.     atorvastatin (LIPITOR) 80 MG tablet Take 80 mg by mouth daily at 6 PM.      azithromycin (ZITHROMAX) 250 MG tablet TAKE 1 TABLET BY MOUTH 3 TIMES A WEEK TAKE ON MONDAY  WEDNESDAY  AND FRIDAY 12 each 1   BREZTRI AEROSPHERE 160-9-4.8 MCG/ACT AERO INHALE 2 PUFFS INTO THE LUNGS ONCE EVERYMORNING AND AT BEDTIME 10.7 g 5   FLUoxetine (PROZAC) 10 MG capsule Take 10 mg by mouth every evening.     gabapentin (NEURONTIN) 600 MG tablet Take 1 tablet (600 mg total) by mouth 2 (two) times daily. 60 tablet 11   HYDROcodone-acetaminophen (NORCO/VICODIN) 5-325 MG tablet Take 1 tablet by mouth every 12 (twelve) hours as needed for moderate pain or severe pain. 30 tablet 0   levETIRAcetam (KEPPRA) 500 MG tablet Take 500 mg by mouth 2 (two) times daily.     levothyroxine (SYNTHROID) 112 MCG tablet Take 112 mcg by mouth daily.     meloxicam (MOBIC) 15 MG tablet Take 1 tablet (15 mg total) by mouth daily. Take with largest meal of the day. (Patient taking differently: Take 15 mg by mouth daily with supper. Take with largest meal of the day.) 90 tablet 3   Multiple Vitamin (MULTIVITAMIN) tablet Take 1 tablet by mouth daily.     omeprazole (PRILOSEC) 40 MG capsule Take 40 mg by mouth 2 (two) times daily.     VENTOLIN HFA 108 (90 Base) MCG/ACT inhaler Inhale 1-2 puffs into the lungs every 6 (six) hours as needed (for shortness of breath/wheezing.). 1 g 6   No  current facility-administered medications for this visit.    VITAL SIGNS: There were no vitals taken for this visit. There were no vitals filed for this visit.  Estimated body mass index is 43.48 kg/m as calculated from the following:   Height as of 02/05/22: 5' 10"  (1.778 m).   Weight as of an earlier encounter on 02/16/22: 303 lb (137.4 kg).  LABS: CBC:    Component Value Date/Time   WBC 13.8 (H) 02/12/2022 0536   HGB 14.5 02/12/2022 0536   HGB 14.9 08/28/2014 0909   HCT 45.3 02/12/2022 0536   HCT 42.3 08/28/2014 0909   PLT 197 02/12/2022 0536   PLT 141 (L) 08/28/2014 0909   MCV 89.0 02/12/2022 0536   MCV 97 08/28/2014 0909   NEUTROABS 10.5 (H) 02/05/2022 1445   NEUTROABS 5.4 08/28/2014 0909   LYMPHSABS 1.1 02/05/2022 1445   LYMPHSABS 0.8 (L) 08/28/2014 0909   MONOABS 0.9 02/05/2022 1445   MONOABS 0.6 08/28/2014 0909   EOSABS 0.5 02/05/2022 1445   EOSABS 0.1 08/28/2014 0909   BASOSABS 0.1 02/05/2022 1445   BASOSABS 0.0  08/28/2014 0909   Comprehensive Metabolic Panel:    Component Value Date/Time   NA 141 02/12/2022 0536   NA 137 08/28/2014 0909   K 3.7 02/12/2022 0536   K 3.5 08/28/2014 0909   CL 103 02/12/2022 0536   CL 101 08/28/2014 0909   CO2 32 02/12/2022 0536   CO2 28 08/28/2014 0909   BUN 31 (H) 02/12/2022 0536   BUN 21 (H) 08/28/2014 0909   CREATININE 1.12 02/12/2022 0536   CREATININE 1.30 (H) 08/28/2014 0909   GLUCOSE 105 (H) 02/12/2022 0536   GLUCOSE 121 (H) 08/28/2014 0909   CALCIUM 8.8 (L) 02/12/2022 0536   CALCIUM 9.6 08/28/2014 0909   AST 24 02/05/2022 1445   AST 18 08/28/2014 0909   ALT 18 02/05/2022 1445   ALT 17 08/28/2014 0909   ALKPHOS 85 02/05/2022 1445   ALKPHOS 80 08/28/2014 0909   BILITOT 0.7 02/05/2022 1445   BILITOT 0.9 08/28/2014 0909   PROT 6.6 02/05/2022 1445   PROT 7.1 08/28/2014 0909   ALBUMIN 3.6 02/05/2022 1445   ALBUMIN 4.0 08/28/2014 0909    RADIOGRAPHIC STUDIES: CT HEAD W & WO CONTRAST (5MM)  Result Date:  02/11/2022 CLINICAL DATA:  Pulmonary lesion concerning for adenocarcinoma concern for metastatic disease. EXAM: CT HEAD WITHOUT AND WITH CONTRAST TECHNIQUE: Contiguous axial images were obtained from the base of the skull through the vertex without and with intravenous contrast. RADIATION DOSE REDUCTION: This exam was performed according to the departmental dose-optimization program which includes automated exposure control, adjustment of the mA and/or kV according to patient size and/or use of iterative reconstruction technique. CONTRAST:  72m OMNIPAQUE IOHEXOL 300 MG/ML  SOLN COMPARISON:  CT head 07/07/2019 FINDINGS: Brain: There is no acute intracranial hemorrhage, extra-axial fluid collection, or acute infarct. There is background parenchymal volume loss with prominence of the ventricular system and extra-axial CSF spaces. There is a remote infarct in the right PCA distribution with associated ex vacuo dilatation of the right lateral ventricle, unchanged. There are remote lacunar infarcts in the right lentiform nucleus and left caudate body, unchanged. Additional patchy hypodensity in the supratentorial white matter likely reflects sequela of underlying chronic small vessel ischemic change. There is no mass lesion or abnormal enhancement. There is no mass effect or midline shift. Vascular: There is calcification of the bilateral carotid siphons. Skull: Normal. Negative for fracture or focal lesion. Sinuses/Orbits: There is mucosal thickening in the left maxillary sinus. Globes and orbits are unremarkable. Other: There is a trace right mastoid effusion. Imaged right nasopharynx is unremarkable. IMPRESSION: No evidence of intracranial metastatic disease. Electronically Signed   By: PValetta MoleM.D.   On: 02/11/2022 16:47   ECHOCARDIOGRAM COMPLETE  Result Date: 02/08/2022    ECHOCARDIOGRAM REPORT   Patient Name:   Shawn Lindsey Date of Exam: 02/08/2022 Medical Rec #:  [id number]     Height:       70.0 in  Accession #:    [id number]    Weight:       303.0 lb Date of Birth:  [birth year]     BSA:          2.490 m Patient Age:    89years       BP:           121/61 mmHg Patient Gender: M              HR:           85 bpm. Exam  Location:  [facility] Procedure: 2D Echo, Cardiac Doppler and Color Doppler Indications:     I50.31 CHF Acute Diastolic  History:         Patient has no prior history of Echocardiogram examinations.                  COPD; Risk Factors:Current Smoker, Hypertension, Dyslipidemia                  and Diabetes.  Sonographer:     Rosalia Hammers Referring Phys:  [id number] CARMEN L GONZALEZ Diagnosing Phys: Yolonda Kida MD  Sonographer Comments: Technically difficult study due to poor echo windows. Image acquisition challenging due to patient body habitus, Image acquisition challenging due to respiratory motion and Image acquisition challenging due to COPD. IMPRESSIONS  1. Left ventricular ejection fraction, by estimation, is 45 to 50%. The left ventricle has mildly decreased function. The left ventricle has no regional wall motion abnormalities. Left ventricular diastolic parameters are consistent with Grade I diastolic dysfunction (impaired relaxation).  2. Right ventricular systolic function is normal. The right ventricular size is normal.  3. The mitral valve is normal in structure. Trivial mitral valve regurgitation.  4. The aortic valve is normal in structure. Aortic valve regurgitation is not visualized. Aortic valve sclerosis is present, with no evidence of aortic valve stenosis. FINDINGS  Left Ventricle: Left ventricular ejection fraction, by estimation, is 45 to 50%. The left ventricle has mildly decreased function. The left ventricle has no regional wall motion abnormalities. The left ventricular internal cavity size was normal in size. There is no left ventricular hypertrophy. Left ventricular diastolic parameters are consistent with Grade I diastolic dysfunction (impaired relaxation). Right  Ventricle: The right ventricular size is normal. No increase in right ventricular wall thickness. Right ventricular systolic function is normal. Left Atrium: Left atrial size was normal in size. Right Atrium: Right atrial size was normal in size. Pericardium: There is no evidence of pericardial effusion. Mitral Valve: The mitral valve is normal in structure. Trivial mitral valve regurgitation. Tricuspid Valve: The tricuspid valve is normal in structure. Tricuspid valve regurgitation is trivial. Aortic Valve: The aortic valve is normal in structure. Aortic valve regurgitation is not visualized. Aortic valve sclerosis is present, with no evidence of aortic valve stenosis. Aortic valve mean gradient measures 7.0 mmHg. Aortic valve peak gradient measures 12.5 mmHg. Aortic valve area, by VTI measures 2.74 cm. Pulmonic Valve: The pulmonic valve was normal in structure. Pulmonic valve regurgitation is not visualized. Aorta: The ascending aorta was not well visualized. IAS/Shunts: No atrial level shunt detected by color flow Doppler.  LEFT VENTRICLE PLAX 2D LVIDd:         5.00 cm   Diastology LVIDs:         3.90 cm   LV e' medial:    18.90 cm/s LV PW:         1.10 cm   LV E/e' medial:  6.3 LV IVS:        1.10 cm   LV e' lateral:   10.30 cm/s LVOT diam:     2.50 cm   LV E/e' lateral: 11.7 LV SV:         91 LV SV Index:   37 LVOT Area:     4.91 cm  RIGHT VENTRICLE RV Basal diam:  4.10 cm RV Mid diam:    2.80 cm RV S prime:     13.80 cm/s TAPSE (M-mode): 1.9 cm LEFT ATRIUM  Index        RIGHT ATRIUM           Index LA diam:        3.20 cm 1.28 cm/m   RA Area:     14.90 cm LA Vol (A2C):   48.8 ml 19.60 ml/m  RA Volume:   36.40 ml  14.62 ml/m LA Vol (A4C):   65.2 ml 26.18 ml/m LA Biplane Vol: 58.2 ml 23.37 ml/m  AORTIC VALVE AV Area (Vmax):    2.86 cm AV Area (Vmean):   2.75 cm AV Area (VTI):     2.74 cm AV Vmax:           177.00 cm/s AV Vmean:          118.000 cm/s AV VTI:            0.333 m AV Peak Grad:       12.5 mmHg AV Mean Grad:      7.0 mmHg LVOT Vmax:         103.00 cm/s LVOT Vmean:        66.200 cm/s LVOT VTI:          0.186 m LVOT/AV VTI ratio: 0.56  AORTA Ao Root diam: 4.20 cm MITRAL VALVE MV Area (PHT): 3.50 cm     SHUNTS MV Decel Time: 217 msec     Systemic VTI:  0.19 m MV E velocity: 120.00 cm/s  Systemic Diam: 2.50 cm MV A velocity: 122.00 cm/s MV E/A ratio:  0.98 Dwayne D Callwood MD Electronically signed by Yolonda Kida MD Signature Date/Time: 02/08/2022/5:03:18 PM    Final    DG Chest 2 View  Result Date: 02/08/2022 CLINICAL DATA:  Pleural effusion EXAM: CHEST - 2 VIEW COMPARISON:  Chest x-ray dated February 07, 2022 FINDINGS: Cardiac and mediastinal contours are unchanged. Volume loss of the right hemithorax with persistent moderate loculated right pleural effusion and bilateral patchy airspace opacities. No evidence of pneumothorax. IMPRESSION: Unchanged moderate loculated right pleural effusion and bilateral patchy airspace opacities. Electronically Signed   By: Yetta Glassman M.D.   On: 02/08/2022 13:44   DG Chest Port 1 View  Result Date: 02/07/2022 CLINICAL DATA:  Status post right thoracentesis. EXAM: PORTABLE CHEST 1 VIEW COMPARISON:  02/05/2022 FINDINGS: Status post right thoracentesis with slight improved aeration of the residual right lung. No pneumothorax. Stable appearance of left lung. IMPRESSION: Status post right thoracentesis with slight improved aeration of the residual right lung. No pneumothorax. Electronically Signed   By: Aletta Edouard M.D.   On: 02/07/2022 12:04   US THORACENTESIS ASP PLEURAL SPACE W/IMG GUIDE  Result Date: 02/07/2022 INDICATION: 71 year old male inpatient smoker. History of COPD, diabetes, hypertension, morbid obesity, CVA. With a known right upper lobe lung mass. Admitted for acute on chronic respiratory failure found to have a right-sided pleural effusion. Request is for therapeutic and diagnostic right-sided thoracentesis EXAM:  ULTRASOUND GUIDED THERAPEUTIC AND DIAGNOSTIC RIGHT-SIDED THORACENTESIS MEDICATIONS: LIDOCAINE 1% 10 ML COMPLICATIONS: None immediate. PROCEDURE: An ultrasound guided thoracentesis was thoroughly discussed with the patient and questions answered. The benefits, risks, alternatives and complications were also discussed. The patient understands and wishes to proceed with the procedure. Written consent was obtained. Ultrasound was performed to localize and mark an adequate pocket of fluid in the right chest. The area was then prepped and draped in the normal sterile fashion. 1% Lidocaine was used for local anesthesia. Under ultrasound guidance a 6 Fr Safe-T-Centesis catheter was introduced. Thoracentesis was performed.  The catheter was removed and a dressing applied. FINDINGS: A total of approximately 1.5 L of straw-colored fluid was removed. Samples were sent to the laboratory as requested by the clinical team. IMPRESSION: Successful ultrasound guided therapeutic and diagnostic right-sided thoracentesis yielding of pleural fluid. Read by: Rushie Nyhan, NP Electronically Signed   By: Corrie Mckusick D.O.   On: 02/07/2022 12:02   CT Angio Chest PE W and/or Wo Contrast  Result Date: 02/05/2022 CLINICAL DATA:  Increased shortness of breath. Smoker. Reports right upper lung collapse for 3 weeks. EXAM: CT ANGIOGRAPHY CHEST WITH CONTRAST TECHNIQUE: Multidetector CT imaging of the chest was performed using the standard protocol during bolus administration of intravenous contrast. Multiplanar CT image reconstructions and MIPs were obtained to evaluate the vascular anatomy. RADIATION DOSE REDUCTION: This exam was performed according to the departmental dose-optimization program which includes automated exposure control, adjustment of the mA and/or kV according to patient size and/or use of iterative reconstruction technique. CONTRAST:  160m OMNIPAQUE IOHEXOL 350 MG/ML SOLN COMPARISON:  Current chest radiograph.  Chest  CT, 12/21/2021. FINDINGS: Cardiovascular: Pulmonary arteries are well opacified. Respiratory motion mildly limits assessment of the smaller segmental/subsegmental vessels, particularly to the left lower lobe. Allowing for this, there is no evidence of a pulmonary embolism. Heart is normal in size. Left and circumflex coronary artery calcifications. No pericardial effusion. Aorta normal in caliber. Aortic atherosclerosis. No dissection. Mediastinum/Nodes: Mild mediastinal adenopathy. Precarinal node measures 1.7 cm short axis. Subcarinal node measures 1.7 cm in short axis. Enlarged left hilar node, 1.4 cm in short axis. No mediastinal or hilar masses. Trachea is partly collapse, exam appears extra tori. Trachea otherwise unremarkable. Normal esophagus. Lungs/Pleura: Large moderate right pleural effusion. Right upper lobe bronchus is occluded. There is surrounding soft tissue, contiguous with the partly, which could reflect a mass, approximately 4 x 3.4 cm transversely. There is patchy airspace consolidation in the right lower lobe collapsed right upper lobe. Right middle lobe is collapsed. Additional patchy opacities noted in the right upper lobe. Patchy areas of ground-glass opacity are noted in the left lower lobe and anteromedial left upper lobe. 8 mm nodule, subpleural, left lower lobe, image 114/5. Moderate centrilobular emphysema. No left pleural effusion. No pneumothorax. Upper Abdomen: No acute findings. No visualized liver or adrenal mass. Dependent gallstone. Musculoskeletal: No fracture or acute finding. No bone lesion. No chest wall mass. Review of the MIP images confirms the above findings. IMPRESSION: 1. No evidence of a pulmonary embolism. 2. Possible central mass versus collapsed lung along the superior right hilum, where the right upper lobe bronchus is occluded. 3. Patchy areas of right upper and lower lobe opacity consistent with multifocal pneumonia. 4. Moderate right pleural effusion. 5. Patchy  ground-glass type opacities in the left upper and left lower lobe may reflect additional areas of infection, but are similar to the prior CT, and may be chronic. 6. 8 mm left lower lobe nodule. 7. Mediastinal and left hilar lymphadenopathy unchanged from the prior CT. 8. Coronary artery calcifications and aortic atherosclerosis. Aortic Atherosclerosis (ICD10-I70.0) and Emphysema (ICD10-J43.9). Electronically Signed   By: DLajean ManesM.D.   On: 02/05/2022 16:41   DG Chest Portable 1 View  Result Date: 02/05/2022 CLINICAL DATA:  Increasing shortness of breath EXAM: PORTABLE CHEST 1 VIEW COMPARISON:  Previous chest radiographs done on 08/02/2021 and CT chest done on 12/21/2021 FINDINGS: Transverse diameter of heart is increased. There is slight prominence of interstitial markings in left lung. There is decreased volume in right lung.  There is pleural density in the periphery of right lung. There is homogeneous opacity in right upper and right lower lung fields. Left lateral CP angle is clear. There is no pneumothorax. IMPRESSION: There is interval decrease in volume in right lung along with increasing amount of right pleural effusion. Part of the increased opacity in right lung may be due to underlying neoplastic process and pneumonia. Prominence of interstitial markings in left lung may suggest interstitial edema or interstitial pneumonia. Electronically Signed   By: Elmer Picker M.D.   On: 02/05/2022 14:57    PERFORMANCE STATUS (ECOG) : 3 - Symptomatic, >50% confined to bed  Review of Systems Unless otherwise noted, a complete review of systems is negative.  Physical Exam General: NAD Cardiovascular: regular rate and rhythm Pulmonary: clear ant fields Abdomen: soft, nontender, + bowel sounds GU: no suprapubic tenderness Extremities: no edema, no joint deformities Skin: no rashes Neurological: Weakness but otherwise nonfocal  IMPRESSION: Patient was seen in the hospital by the PMT.  I  met with patient today following his visit with Dr. Grayland Ormond.  Patient has newly diagnosed stage IV non-small cell lung cancer.  Plan is for concurrent chemoradiation.  Patient is currently at rehab at [facility] but has not been satisfied with the caliber of care at [facility].  He is considering leaving the facility to return home with home health.  We will consult palliative care to help coordinate and provide advocacy at the SNF.  Symptomatically, patient has pain in his back.  He has been on Norco but says that he is not regularly receiving pain medications at the facility.  Dr. Grayland Ormond has sent him back with a prescription for scheduled oxycodone.  We discussed possible option of long-acting opioids if needed.  Patient's goals are aligned with the plan for cancer treatment.  He says that he is interested in pursuing anything to "keep me alive."  He does not have ACP documents but is interested in establishing those.  We will send referral to social work/ACP clinic.  Patient would want his significant other to be his decision-maker if needed.  At this time, patient wants to remain a full code.  He says that he has previously been on a ventilator requiring trach/PEG and is not opposed to repeating that care.  We did discuss the nature of stage/terminal cancer and how that could potentially change the chances of meaningful recovery.  PLAN: -Continue current scope of treatment -Referral to social work and palliative care -Agree with scheduled oxycodone -Follow-up telephone visit 1 month  Case and plan discussed with Dr. Grayland Ormond  Patient expressed understanding and was in agreement with this plan. He also understands that He can call the clinic at any time with any questions, concerns, or complaints.     Time Total: 20 minutes  Visit consisted of counseling and education dealing with the complex and emotionally intense issues of symptom management and palliative care in the setting of  serious and potentially life-threatening illness.Greater than 50%  of this time was spent counseling and coordinating care related to the above assessment and plan.  Signed by: Altha Harm, PhD, NP-C

## 2022-02-16 NOTE — Consult Note (Signed)
NEW PATIENT EVALUATION  Name: Shawn Lindsey  MRN: [id number]  Date:   02/16/2022     DOB: 1950/09/15   This 71 y.o. male patient presents to the clinic for initial evaluation of stage IVa ([id number]).  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   DIAGNOSIS: There were no encounter diagnoses.   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  He has done well with no evidence of recurrence from that carcinoma.  He had been followed for persistent lung mass and multiple biopsies were negative.  He is currently on 5 L of oxygen.  He is wheelchair-bound.  PET scan back in September showed a large right upper lobe mass with volume loss abutting the pleural surface including the right upper lobe bronchus compatible with bronchogenic carcinoma.  There is also a right-sided pleural effusion which eventually was tapped and cytology was positive for poorly differentiated adenocarcinoma PET CT scan also demonstrated subcarinal and right paratracheal nodal enlargement suspicious for nodal involvement of his malignancy.  Recent CT scan showed and confirmed collapse of the lung along the superior right hilum.  Patient has agreed to treatment and is being seen by symptom management and palliative care.  He specifically denies hemoptysis dysphagia or cough.  PLANNED TREATMENT REGIMEN: Concurrent chemoradiation therapy  PAST MEDICAL HISTORY:  has a past medical history of Anxiety, Cancer ([place]) (2015), COPD (chronic obstructive pulmonary disease) ([place]), Diabetes mellitus without complication ([place]), Elevated cholesterol, GERD  (gastroesophageal reflux disease), History of kidney stones, Hypertension, Hypothyroidism, Morbid obesity with BMI of 40.0-44.9, adult ([place]), Pneumonia, Seizures ([place]) (2015), Septic shock ([place]), Sleep apnea (2019), and Stroke ([place]) (2007).    PAST SURGICAL HISTORY:  Past Surgical History:  Procedure Laterality Date   CYSTOSCOPY W/ RETROGRADES Right 07/23/2019   Procedure: CYSTOSCOPY WITH RETROGRADE PYELOGRAM;  Surgeon: Abbie Sons, MD;  Location: [facility];  Service: Urology;  Laterality: Right;   CYSTOSCOPY WITH STENT PLACEMENT Right 07/05/2019   Procedure: CYSTOSCOPY WITH STENT PLACEMENT;  Surgeon: Abbie Sons, MD;  Location: [facility];  Service: Urology;  Laterality: Right;   CYSTOSCOPY/URETEROSCOPY/HOLMIUM LASER/STENT PLACEMENT Right 07/23/2019   Procedure: CYSTOSCOPY/URETEROSCOPY/HOLMIUM LASER/STENT Exchange;  Surgeon: Abbie Sons, MD;  Location: [facility];  Service: Urology;  Laterality: Right;   CYSTOSCOPY/URETEROSCOPY/HOLMIUM LASER/STENT PLACEMENT Right 07/30/2019   Procedure: CYSTOSCOPY/URETEROSCOPY/HOLMIUM LASER/STENT Exchange;  Surgeon: Abbie Sons, MD;  Location: [facility];  Service: Urology;  Laterality: Right;   ELECTROMAGNETIC NAVIGATION BROCHOSCOPY N/A 03/09/2016   Procedure: ELECTROMAGNETIC NAVIGATION BRONCHOSCOPY;  Surgeon: Flora Lipps, MD;  Location: [facility];  Service: Cardiopulmonary;  Laterality: N/A;   GASTROSTOMY TUBE PLACEMENT     REMOVAL OF GASTROSTOMY TUBE     TONSILLECTOMY  2015   TRACHEOSTOMY     VIDEO BRONCHOSCOPY WITH ENDOBRONCHIAL NAVIGATION N/A 06/08/2020   Procedure: VIDEO BRONCHOSCOPY WITH ENDOBRONCHIAL NAVIGATION;  Surgeon: Tyler Pita, MD;  Location: [facility];  Service: Pulmonary;  Laterality: N/A;   VIDEO BRONCHOSCOPY WITH ENDOBRONCHIAL ULTRASOUND N/A 06/08/2020   Procedure: VIDEO BRONCHOSCOPY WITH ENDOBRONCHIAL ULTRASOUND;  Surgeon: Tyler Pita, MD;  Location: [facility];  Service: Pulmonary;  Laterality: N/A;   VIDEO BRONCHOSCOPY WITH  ENDOBRONCHIAL ULTRASOUND Right 08/02/2021  Procedure: BRONCHOSCOPY WITH ENDOBRONCHIAL ULTRASOUND;  Surgeon: Tyler Pita, MD;  Location: [facility];  Service: Pulmonary;  Laterality: Right;    FAMILY HISTORY: family history includes Dementia in his mother; Diabetes in his brother.  SOCIAL HISTORY:  reports that he has been smoking cigarettes. He has a 60.00 pack-year smoking history. He has never used smokeless tobacco. He reports that he does not drink alcohol and does not use drugs.  ALLERGIES: Patient has no known allergies.  MEDICATIONS:  Current Outpatient Medications  Medication Sig Dispense Refill   albuterol (PROVENTIL) (2.5 MG/3ML) 0.083% nebulizer solution Take 3 mLs (2.5 mg total) by nebulization every 6 (six) hours as needed for wheezing or shortness of breath. 125 mL 12   amLODipine (NORVASC) 5 MG tablet Take 5 mg by mouth daily.     atorvastatin (LIPITOR) 80 MG tablet Take 80 mg by mouth daily at 6 PM.      azithromycin (ZITHROMAX) 250 MG tablet TAKE 1 TABLET BY MOUTH 3 TIMES A WEEK TAKE ON MONDAY  WEDNESDAY  AND FRIDAY 12 each 1   BREZTRI AEROSPHERE 160-9-4.8 MCG/ACT AERO INHALE 2 PUFFS INTO THE LUNGS ONCE EVERYMORNING AND AT BEDTIME 10.7 g 5   FLUoxetine (PROZAC) 10 MG capsule Take 10 mg by mouth every evening.     gabapentin (NEURONTIN) 600 MG tablet Take 1 tablet (600 mg total) by mouth 2 (two) times daily. 60 tablet 11   HYDROcodone-acetaminophen (NORCO/VICODIN) 5-325 MG tablet Take 1 tablet by mouth every 12 (twelve) hours as needed for moderate pain or severe pain. 30 tablet 0   levETIRAcetam (KEPPRA) 500 MG tablet Take 500 mg by mouth 2 (two) times daily.     levothyroxine (SYNTHROID) 112 MCG tablet Take 112 mcg by mouth daily.     meloxicam (MOBIC) 15 MG tablet Take 1 tablet (15 mg total) by mouth daily. Take with largest meal of the day. (Patient taking differently: Take 15 mg by mouth daily with supper. Take with largest meal of the day.) 90 tablet 3   Multiple  Vitamin (MULTIVITAMIN) tablet Take 1 tablet by mouth daily.     omeprazole (PRILOSEC) 40 MG capsule Take 40 mg by mouth 2 (two) times daily.     Oxycodone HCl 10 MG TABS Take 1 tablet (10 mg total) by mouth every 6 (six) hours. Patient may refuse. 120 tablet 0   VENTOLIN HFA 108 (90 Base) MCG/ACT inhaler Inhale 1-2 puffs into the lungs every 6 (six) hours as needed (for shortness of breath/wheezing.). 1 g 6   No current facility-administered medications for this encounter.    ECOG PERFORMANCE STATUS:  2 - Symptomatic, <50% confined to bed  REVIEW OF SYSTEMS: Patient denies any weight loss, fatigue, weakness, fever, chills or night sweats. Patient denies any loss of vision, blurred vision. Patient denies any ringing  of the ears or hearing loss. No irregular heartbeat. Patient denies heart murmur or history of fainting. Patient denies any chest pain or pain radiating to her upper extremities. Patient denies any shortness of breath, difficulty breathing at night, cough or hemoptysis. Patient denies any swelling in the lower legs. Patient denies any nausea vomiting, vomiting of blood, or coffee ground material in the vomitus. Patient denies any stomach pain. Patient states has had normal bowel movements no significant constipation or diarrhea. Patient denies any dysuria, hematuria or significant nocturia. Patient denies any problems walking, swelling in the joints or loss of balance. Patient denies any skin changes, loss of hair or loss of  weight. Patient denies any excessive worrying or anxiety or significant depression. Patient denies any problems with insomnia. Patient denies excessive thirst, polyuria, polydipsia. Patient denies any swollen glands, patient denies easy bruising or easy bleeding. Patient denies any recent infections, allergies or URI. Patient "s visual fields have not changed significantly in recent time.   PHYSICAL EXAM: There were no vitals taken for this visit. Obese  wheelchair-bound male on nasal oxygen in NAD well-developed well-nourished patient in NAD. HEENT reveals PERLA, EOMI, discs not visualized.  Oral cavity is clear. No oral mucosal lesions are identified. Neck is clear without evidence of cervical or supraclavicular adenopathy. Lungs are clear to A&P. Cardiac examination is essentially unremarkable with regular rate and rhythm without murmur rub or thrill. Abdomen is benign with no organomegaly or masses noted. Motor sensory and DTR levels are equal and symmetric in the upper and lower extremities. Cranial nerves II through XII are grossly intact. Proprioception is intact. No peripheral adenopathy or edema is identified. No motor or sensory levels are noted. Crude visual fields are within normal range.  LABORATORY DATA: Cytology reviewed    RADIOLOGY RESULTS: PET CT scan and CT scans reviewed compatible with above-stated findings   IMPRESSION: Stage IVa poorly differentiated adenocarcinoma of the right lung diagnosed by pleural cytology in 71 year old male  PLAN: December to go ahead with palliative radiation therapy to his chest.  We will plan on delivering 4000 cGy over 4 weeks and evaluate for response and possible small field boost.  I would use PET/CT for tumor delineation.  We will also use concurrent chemotherapy under Dr. Gary Fleet direction.  There will be extra effort by both professional staff as well as technical staff to coordinate and manage concurrent chemoradiation and ensuing side effects during his treatments. Risks and benefits of treatment occluding possible dysphagia from radiation esophagitis skin reaction fatigue alteration of blood counts all were discussed in detail with the patient.  He seems to comprehend my treatment plan well.  I personally set up and ordered CT simulation for first thing next week.  I would like to take this opportunity to thank you for allowing me to participate in the care of your patient.Noreene Filbert, MD

## 2022-02-16 NOTE — Progress Notes (Signed)
[place]  [phone] [phone]  ID: Shawn Lindsey OB: 28-Dec-1950  MR#: [id number]  KXF#:[id number]  Patient Care Team: Leonel Ramsay, MD as PCP - General (Infectious Diseases) Flora Lipps, MD as Consulting Physician (Pulmonary Disease)  CHIEF COMPLAINT: Stage IV adenocarcinoma of the lung.  INTERVAL HISTORY: Patient last seen in clinic in June 2019 was recently admitted to the hospital with acute on chronic respiratory distress.  He has had a known lung lesion for many years, but biopsies were persistently negative for malignancy.  While in the hospital patient had thoracentesis which was positive for adenocarcinoma consistent with lung primary.  Patient continues to have persistent weakness and fatigue and shortness of breath now requiring oxygen 24 hours a day.  He has right shoulder pain.  He is he has no neurologic complaints.  He denies any recent fevers.  He does not complain of cough or hemoptysis.  He has no nausea, vomiting, constipation, or diarrhea.  He has no urinary complaints.  Patient otherwise feels well and offers no further specific complaints today.  REVIEW OF SYSTEMS:   Review of Systems  Constitutional:  Positive for malaise/fatigue. Negative for fever and weight loss.  Respiratory:  Positive for shortness of breath. Negative for cough and hemoptysis.   Cardiovascular: Negative.  Negative for chest pain and leg swelling.  Gastrointestinal:  Negative for abdominal pain.  Genitourinary: Negative.  Negative for dysuria.  Musculoskeletal:  Positive for joint pain.  Skin: Negative.  Negative for rash.  Neurological:  Positive for weakness. Negative for dizziness, focal weakness and headaches.  Psychiatric/Behavioral: Negative.  The patient is not nervous/anxious.     As per HPI. Otherwise, a complete review of systems is negative.  PAST MEDICAL HISTORY: Past Medical History:  Diagnosis Date   Anxiety    mild    Cancer ([place]) 2015   tonsil   COPD (chronic obstructive pulmonary disease) (HCC)    Diabetes mellitus without complication ([place])    controlled with diet   Elevated cholesterol    GERD (gastroesophageal reflux disease)    History of kidney stones    Hypertension    Hypothyroidism    Morbid obesity with BMI of 40.0-44.9, adult (HCC)    Pneumonia    klebsiella pneumoniae; extended stay in hospital requiring tracheotomy and gastrotomy   Seizures ([place]) 2015   related to sepsis   Septic shock ([place])    Sleep apnea 2019   has a Cpap machime    Stroke ([place]) 2007   no peripheral vision on left side of both eyes & left arm gets numb under stress    PAST SURGICAL HISTORY: Past Surgical History:  Procedure Laterality Date   CYSTOSCOPY W/ RETROGRADES Right 07/23/2019   Procedure: CYSTOSCOPY WITH RETROGRADE PYELOGRAM;  Surgeon: Abbie Sons, MD;  Location: [facility];  Service: Urology;  Laterality: Right;   CYSTOSCOPY WITH STENT PLACEMENT Right 07/05/2019   Procedure: CYSTOSCOPY WITH STENT PLACEMENT;  Surgeon: Abbie Sons, MD;  Location: [facility];  Service: Urology;  Laterality: Right;   CYSTOSCOPY/URETEROSCOPY/HOLMIUM LASER/STENT PLACEMENT Right 07/23/2019   Procedure: CYSTOSCOPY/URETEROSCOPY/HOLMIUM LASER/STENT Exchange;  Surgeon: Abbie Sons, MD;  Location: [facility];  Service: Urology;  Laterality: Right;   CYSTOSCOPY/URETEROSCOPY/HOLMIUM LASER/STENT PLACEMENT Right 07/30/2019   Procedure: CYSTOSCOPY/URETEROSCOPY/HOLMIUM LASER/STENT Exchange;  Surgeon: Abbie Sons, MD;  Location: [facility];  Service: Urology;  Laterality: Right;   ELECTROMAGNETIC NAVIGATION BROCHOSCOPY N/A 03/09/2016   Procedure: ELECTROMAGNETIC NAVIGATION BRONCHOSCOPY;  Surgeon: Flora Lipps, MD;  Location: [facility];  Service: Cardiopulmonary;  Laterality: N/A;   GASTROSTOMY TUBE PLACEMENT     REMOVAL OF GASTROSTOMY TUBE     TONSILLECTOMY  2015   TRACHEOSTOMY     VIDEO BRONCHOSCOPY WITH ENDOBRONCHIAL NAVIGATION  N/A 06/08/2020   Procedure: VIDEO BRONCHOSCOPY WITH ENDOBRONCHIAL NAVIGATION;  Surgeon: Tyler Pita, MD;  Location: [facility];  Service: Pulmonary;  Laterality: N/A;   VIDEO BRONCHOSCOPY WITH ENDOBRONCHIAL ULTRASOUND N/A 06/08/2020   Procedure: VIDEO BRONCHOSCOPY WITH ENDOBRONCHIAL ULTRASOUND;  Surgeon: Tyler Pita, MD;  Location: [facility];  Service: Pulmonary;  Laterality: N/A;   VIDEO BRONCHOSCOPY WITH ENDOBRONCHIAL ULTRASOUND Right 08/02/2021   Procedure: BRONCHOSCOPY WITH ENDOBRONCHIAL ULTRASOUND;  Surgeon: Tyler Pita, MD;  Location: [facility];  Service: Pulmonary;  Laterality: Right;    FAMILY HISTORY: Family History  Problem Relation Age of Onset   Diabetes Brother    Dementia Mother     ADVANCED DIRECTIVES (Y/N):  N  HEALTH MAINTENANCE: Social History   Tobacco Use   Smoking status: Every Day    Packs/day: 1.50    Years: 40.00    Total pack years: 60.00    Types: Cigarettes   Smokeless tobacco: Never   Tobacco comments:    1ppd - 12/23/2021  not trying to quit.  12/23/2021 hfb  Vaping Use   Vaping Use: Never used  Substance Use Topics   Alcohol use: No   Drug use: No     Colonoscopy:  PAP:  Bone density:  Lipid panel:  No Known Allergies  Current Outpatient Medications  Medication Sig Dispense Refill   albuterol (PROVENTIL) (2.5 MG/3ML) 0.083% nebulizer solution Take 3 mLs (2.5 mg total) by nebulization every 6 (six) hours as needed for wheezing or shortness of breath. 125 mL 12   amLODipine (NORVASC) 5 MG tablet Take 5 mg by mouth daily.     atorvastatin (LIPITOR) 80 MG tablet Take 80 mg by mouth daily at 6 PM.      azithromycin (ZITHROMAX) 250 MG tablet TAKE 1 TABLET BY MOUTH 3 TIMES A WEEK TAKE ON MONDAY  WEDNESDAY  AND FRIDAY 12 each 1   BREZTRI AEROSPHERE 160-9-4.8 MCG/ACT AERO INHALE 2 PUFFS INTO THE LUNGS ONCE EVERYMORNING AND AT BEDTIME 10.7 g 5   FLUoxetine (PROZAC) 10 MG capsule Take 10 mg by mouth every evening.     gabapentin  (NEURONTIN) 600 MG tablet Take 1 tablet (600 mg total) by mouth 2 (two) times daily. 60 tablet 11   HYDROcodone-acetaminophen (NORCO/VICODIN) 5-325 MG tablet Take 1 tablet by mouth every 12 (twelve) hours as needed for moderate pain or severe pain. 30 tablet 0   levETIRAcetam (KEPPRA) 500 MG tablet Take 500 mg by mouth 2 (two) times daily.     levothyroxine (SYNTHROID) 112 MCG tablet Take 112 mcg by mouth daily.     meloxicam (MOBIC) 15 MG tablet Take 1 tablet (15 mg total) by mouth daily. Take with largest meal of the day. (Patient taking differently: Take 15 mg by mouth daily with supper. Take with largest meal of the day.) 90 tablet 3   Multiple Vitamin (MULTIVITAMIN) tablet Take 1 tablet by mouth daily.     omeprazole (PRILOSEC) 40 MG capsule Take 40 mg by mouth 2 (two) times daily.     Oxycodone HCl 10 MG TABS Take 1 tablet (10 mg total) by mouth every 6 (six) hours. Patient may refuse. 120 tablet 0   VENTOLIN HFA 108 (90 Base) MCG/ACT inhaler Inhale 1-2 puffs into  the lungs every 6 (six) hours as needed (for shortness of breath/wheezing.). 1 g 6   No current facility-administered medications for this visit.    OBJECTIVE: Vitals:   02/16/22 1012  BP: 133/78  Pulse: 86  Temp: 98 F (36.7 C)     Body mass index is 43.48 kg/m.    ECOG FS:1 - Symptomatic but completely ambulatory  General: Well-developed, well-nourished, no acute distress.  Sitting in a wheelchair. Eyes: Pink conjunctiva, anicteric sclera. HEENT: Normocephalic, moist mucous membranes. Lungs: No audible wheezing or coughing. Heart: Regular rate and rhythm. Abdomen: Soft, nontender, no obvious distention. Musculoskeletal: No edema, cyanosis, or clubbing. Neuro: Alert, answering all questions appropriately. Cranial nerves grossly intact. Skin: No rashes or petechiae noted. Psych: Normal affect. Lymphatics: No cervical, calvicular, axillary or inguinal LAD.   LAB RESULTS:  Lab Results  Component Value Date   NA  141 02/12/2022   K 3.7 02/12/2022   CL 103 02/12/2022   CO2 32 02/12/2022   GLUCOSE 105 (H) 02/12/2022   BUN 31 (H) 02/12/2022   CREATININE 1.12 02/12/2022   CALCIUM 8.8 (L) 02/12/2022   PROT 6.6 02/05/2022   ALBUMIN 3.6 02/05/2022   AST 24 02/05/2022   ALT 18 02/05/2022   ALKPHOS 85 02/05/2022   BILITOT 0.7 02/05/2022   GFRNONAA >60 02/12/2022   GFRAA >60 07/11/2019    Lab Results  Component Value Date   WBC 13.8 (H) 02/12/2022   NEUTROABS 10.5 (H) 02/05/2022   HGB 14.5 02/12/2022   HCT 45.3 02/12/2022   MCV 89.0 02/12/2022   PLT 197 02/12/2022     STUDIES: CT HEAD W & WO CONTRAST (5MM)  Result Date: 02/11/2022 CLINICAL DATA:  Pulmonary lesion concerning for adenocarcinoma concern for metastatic disease. EXAM: CT HEAD WITHOUT AND WITH CONTRAST TECHNIQUE: Contiguous axial images were obtained from the base of the skull through the vertex without and with intravenous contrast. RADIATION DOSE REDUCTION: This exam was performed according to the departmental dose-optimization program which includes automated exposure control, adjustment of the mA and/or kV according to patient size and/or use of iterative reconstruction technique. CONTRAST:  30mL OMNIPAQUE IOHEXOL 300 MG/ML  SOLN COMPARISON:  CT head 07/07/2019 FINDINGS: Brain: There is no acute intracranial hemorrhage, extra-axial fluid collection, or acute infarct. There is background parenchymal volume loss with prominence of the ventricular system and extra-axial CSF spaces. There is a remote infarct in the right PCA distribution with associated ex vacuo dilatation of the right lateral ventricle, unchanged. There are remote lacunar infarcts in the right lentiform nucleus and left caudate body, unchanged. Additional patchy hypodensity in the supratentorial white matter likely reflects sequela of underlying chronic small vessel ischemic change. There is no mass lesion or abnormal enhancement. There is no mass effect or midline shift.  Vascular: There is calcification of the bilateral carotid siphons. Skull: Normal. Negative for fracture or focal lesion. Sinuses/Orbits: There is mucosal thickening in the left maxillary sinus. Globes and orbits are unremarkable. Other: There is a trace right mastoid effusion. Imaged right nasopharynx is unremarkable. IMPRESSION: No evidence of intracranial metastatic disease. Electronically Signed   By: Valetta Mole M.D.   On: 02/11/2022 16:47   ECHOCARDIOGRAM COMPLETE  Result Date: 02/08/2022    ECHOCARDIOGRAM REPORT   Patient Name:   Dowell A Hise Date of Exam: 02/08/2022 Medical Rec #:  [id number]      Height:       70.0 in Accession #:    [id number]     Weight:  303.0 lb Date of Birth:  [birth year]      BSA:          2.490 m Patient Age:    22 years       BP:           121/61 mmHg Patient Gender: M              HR:           85 bpm. Exam Location:  [facility] Procedure: 2D Echo, Cardiac Doppler and Color Doppler Indications:     I50.31 CHF Acute Diastolic  History:         Patient has no prior history of Echocardiogram examinations.                  COPD; Risk Factors:Current Smoker, Hypertension, Dyslipidemia                  and Diabetes.  Sonographer:     Rosalia Hammers Referring Phys:  [id number] CARMEN L GONZALEZ Diagnosing Phys: Yolonda Kida MD  Sonographer Comments: Technically difficult study due to poor echo windows. Image acquisition challenging due to patient body habitus, Image acquisition challenging due to respiratory motion and Image acquisition challenging due to COPD. IMPRESSIONS  1. Left ventricular ejection fraction, by estimation, is 45 to 50%. The left ventricle has mildly decreased function. The left ventricle has no regional wall motion abnormalities. Left ventricular diastolic parameters are consistent with Grade I diastolic dysfunction (impaired relaxation).  2. Right ventricular systolic function is normal. The right ventricular size is normal.  3. The mitral valve is normal in  structure. Trivial mitral valve regurgitation.  4. The aortic valve is normal in structure. Aortic valve regurgitation is not visualized. Aortic valve sclerosis is present, with no evidence of aortic valve stenosis. FINDINGS  Left Ventricle: Left ventricular ejection fraction, by estimation, is 45 to 50%. The left ventricle has mildly decreased function. The left ventricle has no regional wall motion abnormalities. The left ventricular internal cavity size was normal in size. There is no left ventricular hypertrophy. Left ventricular diastolic parameters are consistent with Grade I diastolic dysfunction (impaired relaxation). Right Ventricle: The right ventricular size is normal. No increase in right ventricular wall thickness. Right ventricular systolic function is normal. Left Atrium: Left atrial size was normal in size. Right Atrium: Right atrial size was normal in size. Pericardium: There is no evidence of pericardial effusion. Mitral Valve: The mitral valve is normal in structure. Trivial mitral valve regurgitation. Tricuspid Valve: The tricuspid valve is normal in structure. Tricuspid valve regurgitation is trivial. Aortic Valve: The aortic valve is normal in structure. Aortic valve regurgitation is not visualized. Aortic valve sclerosis is present, with no evidence of aortic valve stenosis. Aortic valve mean gradient measures 7.0 mmHg. Aortic valve peak gradient measures 12.5 mmHg. Aortic valve area, by VTI measures 2.74 cm. Pulmonic Valve: The pulmonic valve was normal in structure. Pulmonic valve regurgitation is not visualized. Aorta: The ascending aorta was not well visualized. IAS/Shunts: No atrial level shunt detected by color flow Doppler.  LEFT VENTRICLE PLAX 2D LVIDd:         5.00 cm   Diastology LVIDs:         3.90 cm   LV e' medial:    18.90 cm/s LV PW:         1.10 cm   LV E/e' medial:  6.3 LV IVS:        1.10 cm   LV e'  lateral:   10.30 cm/s LVOT diam:     2.50 cm   LV E/e' lateral: 11.7 LV SV:          91 LV SV Index:   37 LVOT Area:     4.91 cm  RIGHT VENTRICLE RV Basal diam:  4.10 cm RV Mid diam:    2.80 cm RV S prime:     13.80 cm/s TAPSE (M-mode): 1.9 cm LEFT ATRIUM             Index        RIGHT ATRIUM           Index LA diam:        3.20 cm 1.28 cm/m   RA Area:     14.90 cm LA Vol (A2C):   48.8 ml 19.60 ml/m  RA Volume:   36.40 ml  14.62 ml/m LA Vol (A4C):   65.2 ml 26.18 ml/m LA Biplane Vol: 58.2 ml 23.37 ml/m  AORTIC VALVE AV Area (Vmax):    2.86 cm AV Area (Vmean):   2.75 cm AV Area (VTI):     2.74 cm AV Vmax:           177.00 cm/s AV Vmean:          118.000 cm/s AV VTI:            0.333 m AV Peak Grad:      12.5 mmHg AV Mean Grad:      7.0 mmHg LVOT Vmax:         103.00 cm/s LVOT Vmean:        66.200 cm/s LVOT VTI:          0.186 m LVOT/AV VTI ratio: 0.56  AORTA Ao Root diam: 4.20 cm MITRAL VALVE MV Area (PHT): 3.50 cm     SHUNTS MV Decel Time: 217 msec     Systemic VTI:  0.19 m MV E velocity: 120.00 cm/s  Systemic Diam: 2.50 cm MV A velocity: 122.00 cm/s MV E/A ratio:  0.98 Dwayne D Callwood MD Electronically signed by Yolonda Kida MD Signature Date/Time: 02/08/2022/5:03:18 PM    Final    DG Chest 2 View  Result Date: 02/08/2022 CLINICAL DATA:  Pleural effusion EXAM: CHEST - 2 VIEW COMPARISON:  Chest x-ray dated February 07, 2022 FINDINGS: Cardiac and mediastinal contours are unchanged. Volume loss of the right hemithorax with persistent moderate loculated right pleural effusion and bilateral patchy airspace opacities. No evidence of pneumothorax. IMPRESSION: Unchanged moderate loculated right pleural effusion and bilateral patchy airspace opacities. Electronically Signed   By: Yetta Glassman M.D.   On: 02/08/2022 13:44   DG Chest Port 1 View  Result Date: 02/07/2022 CLINICAL DATA:  Status post right thoracentesis. EXAM: PORTABLE CHEST 1 VIEW COMPARISON:  02/05/2022 FINDINGS: Status post right thoracentesis with slight improved aeration of the residual right lung. No  pneumothorax. Stable appearance of left lung. IMPRESSION: Status post right thoracentesis with slight improved aeration of the residual right lung. No pneumothorax. Electronically Signed   By: Aletta Edouard M.D.   On: 02/07/2022 12:04   US THORACENTESIS ASP PLEURAL SPACE W/IMG GUIDE  Result Date: 02/07/2022 INDICATION: 71 year old male inpatient smoker. History of COPD, diabetes, hypertension, morbid obesity, CVA. With a known right upper lobe lung mass. Admitted for acute on chronic respiratory failure found to have a right-sided pleural effusion. Request is for therapeutic and diagnostic right-sided thoracentesis EXAM: ULTRASOUND GUIDED THERAPEUTIC AND DIAGNOSTIC RIGHT-SIDED THORACENTESIS MEDICATIONS: LIDOCAINE 1% 10 ML  COMPLICATIONS: None immediate. PROCEDURE: An ultrasound guided thoracentesis was thoroughly discussed with the patient and questions answered. The benefits, risks, alternatives and complications were also discussed. The patient understands and wishes to proceed with the procedure. Written consent was obtained. Ultrasound was performed to localize and mark an adequate pocket of fluid in the right chest. The area was then prepped and draped in the normal sterile fashion. 1% Lidocaine was used for local anesthesia. Under ultrasound guidance a 6 Fr Safe-T-Centesis catheter was introduced. Thoracentesis was performed. The catheter was removed and a dressing applied. FINDINGS: A total of approximately 1.5 L of straw-colored fluid was removed. Samples were sent to the laboratory as requested by the clinical team. IMPRESSION: Successful ultrasound guided therapeutic and diagnostic right-sided thoracentesis yielding of pleural fluid. Read by: Rushie Nyhan, NP Electronically Signed   By: Corrie Mckusick D.O.   On: 02/07/2022 12:02   CT Angio Chest PE W and/or Wo Contrast  Result Date: 02/05/2022 CLINICAL DATA:  Increased shortness of breath. Smoker. Reports right upper lung collapse for 3  weeks. EXAM: CT ANGIOGRAPHY CHEST WITH CONTRAST TECHNIQUE: Multidetector CT imaging of the chest was performed using the standard protocol during bolus administration of intravenous contrast. Multiplanar CT image reconstructions and MIPs were obtained to evaluate the vascular anatomy. RADIATION DOSE REDUCTION: This exam was performed according to the departmental dose-optimization program which includes automated exposure control, adjustment of the mA and/or kV according to patient size and/or use of iterative reconstruction technique. CONTRAST:  176mL OMNIPAQUE IOHEXOL 350 MG/ML SOLN COMPARISON:  Current chest radiograph.  Chest CT, 12/21/2021. FINDINGS: Cardiovascular: Pulmonary arteries are well opacified. Respiratory motion mildly limits assessment of the smaller segmental/subsegmental vessels, particularly to the left lower lobe. Allowing for this, there is no evidence of a pulmonary embolism. Heart is normal in size. Left and circumflex coronary artery calcifications. No pericardial effusion. Aorta normal in caliber. Aortic atherosclerosis. No dissection. Mediastinum/Nodes: Mild mediastinal adenopathy. Precarinal node measures 1.7 cm short axis. Subcarinal node measures 1.7 cm in short axis. Enlarged left hilar node, 1.4 cm in short axis. No mediastinal or hilar masses. Trachea is partly collapse, exam appears extra tori. Trachea otherwise unremarkable. Normal esophagus. Lungs/Pleura: Large moderate right pleural effusion. Right upper lobe bronchus is occluded. There is surrounding soft tissue, contiguous with the partly, which could reflect a mass, approximately 4 x 3.4 cm transversely. There is patchy airspace consolidation in the right lower lobe collapsed right upper lobe. Right middle lobe is collapsed. Additional patchy opacities noted in the right upper lobe. Patchy areas of ground-glass opacity are noted in the left lower lobe and anteromedial left upper lobe. 8 mm nodule, subpleural, left lower lobe,  image 114/5. Moderate centrilobular emphysema. No left pleural effusion. No pneumothorax. Upper Abdomen: No acute findings. No visualized liver or adrenal mass. Dependent gallstone. Musculoskeletal: No fracture or acute finding. No bone lesion. No chest wall mass. Review of the MIP images confirms the above findings. IMPRESSION: 1. No evidence of a pulmonary embolism. 2. Possible central mass versus collapsed lung along the superior right hilum, where the right upper lobe bronchus is occluded. 3. Patchy areas of right upper and lower lobe opacity consistent with multifocal pneumonia. 4. Moderate right pleural effusion. 5. Patchy ground-glass type opacities in the left upper and left lower lobe may reflect additional areas of infection, but are similar to the prior CT, and may be chronic. 6. 8 mm left lower lobe nodule. 7. Mediastinal and left hilar lymphadenopathy unchanged from the prior CT.  8. Coronary artery calcifications and aortic atherosclerosis. Aortic Atherosclerosis (ICD10-I70.0) and Emphysema (ICD10-J43.9). Electronically Signed   By: Lajean Manes M.D.   On: 02/05/2022 16:41   DG Chest Portable 1 View  Result Date: 02/05/2022 CLINICAL DATA:  Increasing shortness of breath EXAM: PORTABLE CHEST 1 VIEW COMPARISON:  Previous chest radiographs done on 08/02/2021 and CT chest done on 12/21/2021 FINDINGS: Transverse diameter of heart is increased. There is slight prominence of interstitial markings in left lung. There is decreased volume in right lung. There is pleural density in the periphery of right lung. There is homogeneous opacity in right upper and right lower lung fields. Left lateral CP angle is clear. There is no pneumothorax. IMPRESSION: There is interval decrease in volume in right lung along with increasing amount of right pleural effusion. Part of the increased opacity in right lung may be due to underlying neoplastic process and pneumonia. Prominence of interstitial markings in left lung may  suggest interstitial edema or interstitial pneumonia. Electronically Signed   By: Elmer Picker M.D.   On: 02/05/2022 14:57    ASSESSMENT: Stage IV adenocarcinoma of the lung.  PLAN:    Stage IV adenocarcinoma of the lung: Of note, mass has been present since at least 2017, but multiple biopsies interim have been negative for malignancy.  Confirmed by positive malignancy and pleural fluid.  Patient also had a PET scan on January 12, 2022 which revealed an FDG avid large right upper lobe lung mass with subcarinal and paratracheal nodal enlargement.  CT scan of the head on February 11, 2022 did not report any metastatic disease.  Although patient is stage IV, will treat as a stage IIIb with plans to give daily XRT along with weekly carboplatin and Taxol followed by maintenance immunotherapy.  Patient will require port placement.  Return to clinic in 1 to 2 weeks to initiate cycle 1 of treatment. 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 Leukocytosis: Likely reactive. Shortness of breath: Continue oxygen.  Patient currently at 3 L. Shoulder pain: Likely secondary to underlying malignancy.  Patient was given a prescription for oxycodone today.  He also had consultation with palliative care.  Patient expressed understanding and was in agreement with this plan. He also understands that He can call clinic at any time with any questions, concerns, or complaints.    Cancer Staging  Adenocarcinoma of right lung [facility]) Staging form: Lung, AJCC 8th Edition - Clinical stage from 02/16/2022: Stage IVA (cT2, cN2, cM1a) - Signed by Lloyd Huger, MD on 02/16/2022 Stage prefix: Initial diagnosis  Cancer of tonsil [facility]) Staging form: Pharynx - Oropharynx, AJCC 7th Edition - Clinical stage from 11/03/2014: Stage II (T2, N0, M0) - Signed by Lloyd Huger, MD on 11/03/2014   Lloyd Huger, MD    02/16/2022 12:59 PM

## 2022-02-16 NOTE — Telephone Encounter (Signed)
Port placement request faxed to IR for scheduling. Will notify the patient of appt once scheduled.

## 2022-02-17 ENCOUNTER — Encounter: Payer: Self-pay | Admitting: Licensed Clinical Social Worker

## 2022-02-17 ENCOUNTER — Encounter: Payer: Self-pay | Admitting: Oncology

## 2022-02-17 ENCOUNTER — Telehealth: Payer: Self-pay

## 2022-02-17 ENCOUNTER — Telehealth: Payer: Self-pay | Admitting: *Deleted

## 2022-02-17 ENCOUNTER — Other Ambulatory Visit: Payer: PPO

## 2022-02-17 ENCOUNTER — Other Ambulatory Visit: Payer: Self-pay | Admitting: *Deleted

## 2022-02-17 DIAGNOSIS — D72829 Elevated white blood cell count, unspecified: Secondary | ICD-10-CM | POA: Diagnosis not present

## 2022-02-17 DIAGNOSIS — M2560 Stiffness of unspecified joint, not elsewhere classified: Secondary | ICD-10-CM

## 2022-02-17 DIAGNOSIS — R262 Difficulty in walking, not elsewhere classified: Secondary | ICD-10-CM

## 2022-02-17 DIAGNOSIS — R2689 Other abnormalities of gait and mobility: Secondary | ICD-10-CM

## 2022-02-17 DIAGNOSIS — C3491 Malignant neoplasm of unspecified part of right bronchus or lung: Secondary | ICD-10-CM

## 2022-02-17 DIAGNOSIS — J449 Chronic obstructive pulmonary disease, unspecified: Secondary | ICD-10-CM | POA: Diagnosis not present

## 2022-02-17 DIAGNOSIS — C099 Malignant neoplasm of tonsil, unspecified: Secondary | ICD-10-CM

## 2022-02-17 DIAGNOSIS — R2681 Unsteadiness on feet: Secondary | ICD-10-CM

## 2022-02-17 DIAGNOSIS — Z9181 History of falling: Secondary | ICD-10-CM

## 2022-02-17 DIAGNOSIS — R531 Weakness: Secondary | ICD-10-CM

## 2022-02-17 DIAGNOSIS — C3490 Malignant neoplasm of unspecified part of unspecified bronchus or lung: Secondary | ICD-10-CM | POA: Diagnosis not present

## 2022-02-17 DIAGNOSIS — M6281 Muscle weakness (generalized): Secondary | ICD-10-CM

## 2022-02-17 NOTE — Telephone Encounter (Signed)
[name] called asking to speak with Shawn Lindsey regarding getting Physical Therapy and Home health Nursing at home now that he is out of rehab. Please return her call to discuss

## 2022-02-17 NOTE — Progress Notes (Signed)
[place] Work  Clinical Social Work was referred by medical provider for assessment of psychosocial needs.  Clinical Social Worker attempted to contact patient by phone  to offer support and assess for needs.  CSW left voicemail for patient with contact information and request for a return call    [place], [place]

## 2022-02-17 NOTE — Progress Notes (Signed)
Tumor Board Documentation  Shawn Lindsey was presented by Dr Grayland Ormond at our Tumor Board on 02/17/2022, which included representatives from medical oncology, radiology, pathology, surgical, pharmacy, pulmonology, radiation oncology, navigation, research, internal medicine, palliative care.  Shawn Lindsey currently presents as a new patient, for [place], for new positive pathology with history of the following treatments: active survellience.  Additionally, we reviewed previous medical and familial history, history of present illness, and recent lab results along with all available histopathologic and imaging studies. The tumor board considered available treatment options and made the following recommendations: Concurrent chemo-radiation therapy, Immunotherapy    The following procedures/referrals were also placed: No orders of the defined types were placed in this encounter.   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  Tumor board is a meeting of clinicians from various specialty areas who evaluate and discuss patients for whom a multidisciplinary approach is being considered. Final determinations in the plan of care are those of the provider(s). The responsibility for follow up of recommendations given during tumor board is that of the provider.   Today's extended care, comprehensive team conference, Nikola was not present for the discussion and was not examined.   Multidisciplinary Tumor Board is a multidisciplinary case peer review process.  Decisions discussed in the Multidisciplinary Tumor Board reflect the opinions of the specialists present at the conference without having examined the patient.  Ultimately, treatment and diagnostic decisions rest with the primary provider(s) and the patient.

## 2022-02-17 NOTE — Telephone Encounter (Signed)
[facility] referral entered. Traci Sermon, RN attempted to reach [facility] at [facility] but could not leave a vm on this phone line.  I sent Corene Cornea a community message within Epic to let him know that the referral has been entered.

## 2022-02-17 NOTE — Telephone Encounter (Signed)
Patient scheduled for Port placement on 10/27 at 1:30 patient to arrive at 12:30 pm. Patient has been informed and verbalized understanding.

## 2022-02-17 NOTE — Telephone Encounter (Signed)
Spoke with patient's significant other [name] and scheduled a Mychart Palliative Consult for 02/24/22 @ 2 PM.  Consent obtained; updated Netsmart, Team List and Epic.

## 2022-02-18 ENCOUNTER — Telehealth: Payer: Self-pay | Admitting: *Deleted

## 2022-02-18 ENCOUNTER — Other Ambulatory Visit: Payer: Self-pay | Admitting: Oncology

## 2022-02-18 DIAGNOSIS — C3491 Malignant neoplasm of unspecified part of right bronchus or lung: Secondary | ICD-10-CM

## 2022-02-18 MED ORDER — LIDOCAINE-PRILOCAINE 2.5-2.5 % EX CREA: TOPICAL_CREAM | CUTANEOUS | 3 refills | Status: AC

## 2022-02-18 MED ORDER — PROCHLORPERAZINE MALEATE 10 MG PO TABS: 10.0000 mg | ORAL_TABLET | Freq: Four times a day (QID) | ORAL | 1 refills | Status: AC | PRN

## 2022-02-18 NOTE — Telephone Encounter (Addendum)
[place] referral entered. [facility] and wanted HH. I received the following msg from [place] w/ [facility]  "Unfortunately we are not able to staff this patient at this time. I have checked with other agencies as well and they do not have staff currently for a [facility] patient currently."

## 2022-02-21 ENCOUNTER — Encounter: Payer: PPO | Admitting: Hospice and Palliative Medicine

## 2022-02-21 ENCOUNTER — Ambulatory Visit
Admission: RE | Admit: 2022-02-21 | Discharge: 2022-02-21 | Disposition: A | Discharge status: Home / Self Care | Payer: PPO | Source: Ambulatory Visit | Attending: Radiation Oncology | Admitting: Radiation Oncology

## 2022-02-21 DIAGNOSIS — R0602 Shortness of breath: Secondary | ICD-10-CM | POA: Insufficient documentation

## 2022-02-21 DIAGNOSIS — C3411 Malignant neoplasm of upper lobe, right bronchus or lung: Secondary | ICD-10-CM | POA: Insufficient documentation

## 2022-02-21 DIAGNOSIS — Z510 Encounter for antineoplastic radiation therapy: Secondary | ICD-10-CM | POA: Diagnosis not present

## 2022-02-21 DIAGNOSIS — Z85818 Personal history of malignant neoplasm of other sites of lip, oral cavity, and pharynx: Secondary | ICD-10-CM | POA: Diagnosis not present

## 2022-02-21 DIAGNOSIS — F17210 Nicotine dependence, cigarettes, uncomplicated: Secondary | ICD-10-CM | POA: Diagnosis not present

## 2022-02-22 ENCOUNTER — Telehealth: Payer: Self-pay | Admitting: Pulmonary Disease

## 2022-02-22 ENCOUNTER — Inpatient Hospital Stay: Payer: PPO | Admitting: Licensed Clinical Social Worker

## 2022-02-22 DIAGNOSIS — C3491 Malignant neoplasm of unspecified part of right bronchus or lung: Secondary | ICD-10-CM | POA: Diagnosis not present

## 2022-02-22 DIAGNOSIS — Z6841 Body Mass Index (BMI) 40.0 and over, adult: Secondary | ICD-10-CM | POA: Diagnosis not present

## 2022-02-22 DIAGNOSIS — F324 Major depressive disorder, single episode, in partial remission: Secondary | ICD-10-CM | POA: Diagnosis not present

## 2022-02-22 DIAGNOSIS — I10 Essential (primary) hypertension: Secondary | ICD-10-CM | POA: Diagnosis not present

## 2022-02-22 DIAGNOSIS — C099 Malignant neoplasm of tonsil, unspecified: Secondary | ICD-10-CM

## 2022-02-22 DIAGNOSIS — N1831 Chronic kidney disease, stage 3a: Secondary | ICD-10-CM | POA: Diagnosis not present

## 2022-02-22 DIAGNOSIS — E039 Hypothyroidism, unspecified: Secondary | ICD-10-CM | POA: Diagnosis not present

## 2022-02-22 DIAGNOSIS — E119 Type 2 diabetes mellitus without complications: Secondary | ICD-10-CM | POA: Diagnosis not present

## 2022-02-22 DIAGNOSIS — J449 Chronic obstructive pulmonary disease, unspecified: Secondary | ICD-10-CM

## 2022-02-22 DIAGNOSIS — E1142 Type 2 diabetes mellitus with diabetic polyneuropathy: Secondary | ICD-10-CM | POA: Diagnosis not present

## 2022-02-22 DIAGNOSIS — E785 Hyperlipidemia, unspecified: Secondary | ICD-10-CM | POA: Diagnosis not present

## 2022-02-22 NOTE — Progress Notes (Signed)
Spoke with patient about applying for the Gerlene Fee, he will be in on Monday the 30th to sign paperwork.

## 2022-02-22 NOTE — Progress Notes (Signed)
Shawn Lindsey Work  Initial Assessment   Shawn Lindsey is a 71 y.o. year old male contacted by phone. Clinical Social Work was referred by medical provider for assessment of psychosocial needs.   SDOH (Social Determinants of Health) assessments performed: Yes SDOH Interventions    Flowsheet Row Clinical Support from 02/22/2022 in [place] [facility] [place] Interventions   Food Insecurity Interventions Intervention Not Indicated  Housing Interventions Intervention Not Indicated  Transportation Interventions Intervention Not Indicated, Patient Resources (Friends/Family)  Utilities Interventions Intervention Not Indicated  Alcohol Usage Interventions Intervention Not Indicated (Score <7)  Financial Strain Interventions Intervention Not Indicated, Other (Comment)  [referral to Designer, jewellery and food assistance]  Physical Activity Interventions Intervention Not Indicated  Stress Interventions Intervention Not Indicated  Social Connections Interventions Intervention Not Indicated       SDOH Screenings   Food Insecurity: No Food Insecurity (02/22/2022)  Housing: Low Risk  (02/22/2022)  Transportation Needs: No Transportation Needs (02/22/2022)  Utilities: Not At Risk (02/22/2022)  Alcohol Screen: Low Risk  (02/22/2022)  Depression (PHQ2-9): Low Risk  (02/22/2022)  Financial Resource Strain: Medium Risk (02/22/2022)  Physical Activity: Inactive (02/22/2022)  Social Connections: Moderately Isolated (02/22/2022)  Stress: No Stress Concern Present (02/22/2022)  Tobacco Use: High Risk (02/16/2022)     Distress Screen completed: No    03/16/2016   10:22 AM  ONCBCN DISTRESS SCREENING  Screening Type Initial Screening  Distress experienced in past week (1-10) 0      Family/Social Information:  Housing Arrangement: patient lives with partner    Family members/support persons in your life? Family, Friends, and Geophysical data processor concerns:  no  Employment: Retired  .  Income source: Paediatric nurse concerns:  patient stated he experiencing some financial concerns Type of concern: Loss adjuster, chartered access concerns: no, patient did not identify specific needs, but would like assistance Religious or spiritual practice: Not known Services Currently in place:  [facility]  Coping/ Adjustment to diagnosis: Patient understands treatment plan and what happens next? yes Concerns about diagnosis and/or treatment:  financial concerns  Patient reported stressors: Finances and Adjusting to my illness Hopes and/or priorities: N/A Patient enjoys  N/A Current coping skills/ strengths: Ability for insight , Active sense of humor , Average or above average intelligence , Capable of independent living , Communication skills , Motivation for treatment/growth , and Supportive family/friends     SUMMARY: Current SDOH Barriers:  Financial constraints related to fixed income  Clinical Social Work Clinical Goal(s):  No clinical social work goals at this time  Interventions: Discussed common feeling and emotions when being diagnosed with cancer, and the importance of support during treatment Informed patient of the support team roles and support services at [facility] Provided [place] contact information and encouraged patient to call with any questions or concerns Referred patient to Designer, jewellery and food insecurity assistance and Provided patient with information about CSW role in patient care and other available resources.  Referral sent to RN Navigator to request information about CARE program and possible PT home health.   Follow Up Plan: Patient will contact CSW with any support or resource needs Patient verbalizes understanding of plan: Yes    Shawn Athey, LCSW

## 2022-02-22 NOTE — Telephone Encounter (Signed)
It appears that during recent admission, [facility]. Our office previously prescribed nocturnal oxygen from [facility]. I have spoken to Taylor with [facility], who confirmed that patient only receives nocturnal oxygen from [place].  Lm for patient.

## 2022-02-22 NOTE — Telephone Encounter (Signed)
Patient is aware of below message and voiced his understanding.  Order has been placed to [facility] for 3L cont.  Nothing further needed.

## 2022-02-22 NOTE — Telephone Encounter (Signed)
Spoke to patient. He stated that [facility]. He stated that he has a home concentrator but no portable oxygen. [facility] stated that [facility]. He would like our office to place an order to [facility] for portable oxygen.  Dr. Patsey Berthold, please advise if okay to order.

## 2022-02-22 NOTE — Telephone Encounter (Signed)
Per discharge summary the patient required high flow O2 due to saturations at 79% and was weaned down to 3 L/min nasal cannula O2.  We will try sending an order for that however, patient may still require coming in for ambulatory oximetry.

## 2022-02-23 ENCOUNTER — Encounter: Payer: Self-pay | Admitting: *Deleted

## 2022-02-23 ENCOUNTER — Other Ambulatory Visit: Payer: PPO | Admitting: Primary Care

## 2022-02-23 DIAGNOSIS — Z515 Encounter for palliative care: Secondary | ICD-10-CM

## 2022-02-23 DIAGNOSIS — Z85818 Personal history of malignant neoplasm of other sites of lip, oral cavity, and pharynx: Secondary | ICD-10-CM | POA: Diagnosis not present

## 2022-02-23 DIAGNOSIS — C3491 Malignant neoplasm of unspecified part of right bronchus or lung: Secondary | ICD-10-CM

## 2022-02-23 DIAGNOSIS — E66813 Obesity, class 3: Secondary | ICD-10-CM

## 2022-02-23 DIAGNOSIS — C3411 Malignant neoplasm of upper lobe, right bronchus or lung: Secondary | ICD-10-CM | POA: Diagnosis not present

## 2022-02-23 DIAGNOSIS — J449 Chronic obstructive pulmonary disease, unspecified: Secondary | ICD-10-CM | POA: Diagnosis not present

## 2022-02-23 DIAGNOSIS — F17210 Nicotine dependence, cigarettes, uncomplicated: Secondary | ICD-10-CM | POA: Diagnosis not present

## 2022-02-23 DIAGNOSIS — Z510 Encounter for antineoplastic radiation therapy: Secondary | ICD-10-CM | POA: Diagnosis not present

## 2022-02-23 NOTE — Progress Notes (Signed)
Message received that pt is interested in rehab therapy or exercise program. Phone call made to pt's significant other, Shawn Lindsey, to inform that we can refer pt to rehab screening at the [facility] and coordinate that appt with his other appts. Shawn Lindsey stated that they are expecting agency to come out to his home for further evaluation regarding rehab. Instructed to call back if desires rehab screening at the [facility] in the future. Contact info given. Introduced to navigator services and instructed to call back with any future questions or needs. Informed will follow up at his next visit on 10/30 to provider further support and resources if needed. Shawn Lindsey verbalized understanding. Nothing further needed at this time.

## 2022-02-23 NOTE — Telephone Encounter (Signed)
Per Kenney Houseman with [facility] since there are no 02 testing noted in the hospital notes they can't supply portable 02.  Patient will need to be walked to have current 02 needs Lesleigh Noe is aware of this issue

## 2022-02-23 NOTE — Progress Notes (Addendum)
[facility] Consult Note Telephone: [phone]  Fax: [phone]   Date of encounter: 02/23/22 2:59 PM PATIENT NAME: Shawn Lindsey [address] [place] [address]   [phone] (home)  DOB: [birth year] MRN: [id number] PRIMARY CARE PROVIDER:    Leonel Ramsay, MD,  [place] [id number] [phone]  REFERRING PROVIDER:   Billey Chang NP   RESPONSIBLE PARTY:    Contact Information     Name Relation Home Work Shawn Lindsey other   [phone]   Shawn Lindsey, [place] [phone]        I met face to face with patient and family in home. Palliative Care was asked to follow this patient by consultation request of  Shawn Chang NP  to address advance care planning and complex medical decision making. This is the initial visit.                                     ASSESSMENT AND PLAN / RECOMMENDATIONS:   Advance Care Planning/Goals of Care: Goals include to maximize quality of life and symptom management. Patient/health care surrogate gave his/her permission to discuss.Our advance care planning conversation included a discussion about:    The value and importance of advance care planning  Exploration of personal, cultural or spiritual beliefs that might influence medical decisions  Exploration of goals of care in the event of a sudden injury or illness  Identification of a healthcare agent - Shawn Lindsey Discussed getting POA. Has 2 sons who also discuss decisions. creation of an advance directive document.  I completed a MOST form today. The patient and family outlined their wishes for the following treatment decisions:  Cardiopulmonary Resuscitation: Attempt Resuscitation (CPR)  Medical Interventions: Full Scope of Treatment: Use intubation, advanced airway interventions, mechanical ventilation, cardioversion as indicated, medical treatment, IV fluids, etc, also  provide comfort measures. Transfer to the hospital if indicated  Antibiotics: Antibiotics if indicated  IV Fluids: IV fluids if indicated  Feeding Tube: Feeding tube long-term if indicated   CODE STATUS: FULL I spent 20 minutes providing this consultation. More than 50% of the time in this consultation was spent in counseling and care coordination. ----------------------------------------------------------------------------------------------------------------------------------------------------------------------------------------------------  Symptom Management/Plan:  Pain; Controlled on oxycodone/ acetaminophen 10-325 mg. Has capsules and reviewed meds with patient. Started yesterday which helps. Pain is relieved.   Cancer therapy; Has port placement this week, for chemo. Being Rx for lung cancer. Endorses previous ca tx.  CPAP: States machine is malfunctioning. Will try again and speak with pulmonology. We did a trial and it seems to work so may just need serviced. DOE.   Mobility: Left SNF AMA, after 4 days, Needs home health PT and OT, and RN for med management. PCP please to send orders for home health.  Follow up Palliative Care Visit: Palliative care will continue to follow for complex medical decision making, advance care planning, and clarification of goals. Return 8-12 weeks or prn. RN/SW to f/u as home NP program ending.  This visit was coded based on medical decision making (MDM).  PPS: 40%  HOSPICE ELIGIBILITY/DIAGNOSIS: TBD  Chief Complaint: SOB, fatigue, recent sepsis  HISTORY OF PRESENT ILLNESS:  Shawn Lindsey is a 71 y.o. year old male  with h/o COPD, oxygen dependence, recent pneumonia, debility, obesity.  Patient seen today to review palliative care needs to include medical decision  making and advance care planning as appropriate.  F/u 3 months, or prn.   History obtained from review of EMR, discussion with primary team, and interview with family, facility  staff/caregiver and/or Shawn Lindsey.  I reviewed available labs, medications, imaging, studies and related documents from the EMR.  Records reviewed and summarized above.   ROS  General: NAD EYES: denies vision changes ENMT: denies dysphagia Cardiovascular: denies chest pain, endorses DOE Pulmonary: denies cough, endorses  increased SOB Abdomen: endorses good appetite, occ constipation, endorses continence of bowel GU: denies dysuria, endorses continence of urine MSK:  denies increased weakness,  no falls reported Skin: denies rashes or wounds Neurological:  endorses pain, denies insomnia Psych: Endorses positive mood Heme/lymph/immuno: denies bruises, abnormal bleeding  Physical Exam: Current and past weights: 300 lbs Constitutional: NAD General: frail appearing, obese  EYES: anicteric sclera, lids intact, no discharge  ENMT: hard of hearing, oral mucous membranes moist, dentition intact CV: no E edema Pulmonary: + increased work of breathing, no cough, supplemental oxygen  Abdomen: intake 100%,  soft and non tender, no ascites GU: deferred MSK: mild sarcopenia, moves all extremities, ambulatory, uses w/c for mobility, can use walker  Skin: warm and dry, no rashes or wounds on visible skin Neuro:  + generalized weakness,  no cognitive impairment Psych: non-anxious affect, A and O x 3 Hem/lymph/immuno: no widespread bruising  CURRENT PROBLEM LIST:  Patient Active Problem List   Diagnosis Date Noted   Adenocarcinoma of right lung ([place]) 02/16/2022   Seizure disorder ([place]) 02/10/2022   Anxiety about health 02/08/2022   Pleural effusion on right 02/06/2022   Acute on chronic respiratory failure with hypoxia ([place]) 02/05/2022   History of Klebsiella pneumonia 02/05/2022   Multifocal pneumonia 02/05/2022   Back pain 02/05/2022   Obesity, Class III, BMI 40-49.9 (morbid obesity) ([place]) 02/05/2022   Type 2 diabetes mellitus with diabetic polyneuropathy, with long-term current use of  insulin (HCC)    Nephrolithiasis    Ureterolithiasis    Proteus mirabilis infection    Pneumonia of right upper lobe due to Klebsiella pneumoniae (HCC)    Acute renal failure with tubular necrosis (HCC)    Acute respiratory failure with hypoxia (HCC)    Septic shock ([place]) 07/05/2019   Mass of upper lobe of right lung 09/29/2015   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   HTN (hypertension) 08/05/2014   COPD (chronic obstructive pulmonary disease) ([place]) 08/05/2014   Hyperlipidemia 08/05/2014   PAST MEDICAL HISTORY:  Active Ambulatory Problems    Diagnosis Date Noted   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   HTN (hypertension) 08/05/2014   COPD (chronic obstructive pulmonary disease) ([place]) 08/05/2014   Hyperlipidemia 08/05/2014   Mass of upper lobe of right lung 09/29/2015   Septic shock ([place]) 07/05/2019   Acute respiratory failure with hypoxia (HCC)    Proteus mirabilis infection    Pneumonia of right upper lobe due to Klebsiella pneumoniae (HCC)    Acute renal failure with tubular necrosis (HCC)    Type 2 diabetes mellitus with diabetic polyneuropathy, with long-term current use of insulin (HCC)    Nephrolithiasis    Ureterolithiasis    Acute on chronic respiratory failure with hypoxia ([place]) 02/05/2022   History of Klebsiella pneumonia 02/05/2022   Multifocal pneumonia 02/05/2022   Back pain 02/05/2022   Obesity, Class III, BMI 40-49.9 (morbid obesity) ([place]) 02/05/2022   Pleural effusion on right 02/06/2022   Anxiety about health 02/08/2022   Seizure disorder ([place]) 02/10/2022   Adenocarcinoma  of right lung ([place]) 02/16/2022   Resolved Ambulatory Problems    Diagnosis Date Noted   No Resolved Ambulatory Problems   Past Medical History:  Diagnosis Date   Anxiety    Cancer ([place]) 2015   Diabetes mellitus without complication (HCC)    Elevated cholesterol    GERD (gastroesophageal reflux disease)    History of kidney stones    Hypertension    Hypothyroidism    Morbid obesity  with BMI of 40.0-44.9, adult ([place])    Pneumonia    Seizures ([place]) 2015   Sleep apnea 2019   Stroke [facility]) 2007   SOCIAL HX:  Social History   Tobacco Use   Smoking status: Every Day    Packs/day: 1.50    Years: 40.00    Total pack years: 60.00    Types: Cigarettes   Smokeless tobacco: Never   Tobacco comments:    1ppd - 12/23/2021  not trying to quit.  12/23/2021 hfb  Substance Use Topics   Alcohol use: No   FAMILY HX:  Family History  Problem Relation Age of Onset   Diabetes Brother    Dementia Mother       ALLERGIES: No Known Allergies   PERTINENT MEDICATIONS:  Outpatient Encounter Medications as of 02/23/2022  Medication Sig   albuterol (PROVENTIL) (2.5 MG/3ML) 0.083% nebulizer solution Take 3 mLs (2.5 mg total) by nebulization every 6 (six) hours as needed for wheezing or shortness of breath.   amLODipine (NORVASC) 5 MG tablet Take 5 mg by mouth daily.   atorvastatin (LIPITOR) 80 MG tablet Take 80 mg by mouth daily at 6 PM.    azithromycin (ZITHROMAX) 250 MG tablet TAKE 1 TABLET BY MOUTH 3 TIMES A WEEK TAKE ON MONDAY  WEDNESDAY  AND FRIDAY   BREZTRI AEROSPHERE 160-9-4.8 MCG/ACT AERO INHALE 2 PUFFS INTO THE LUNGS ONCE EVERYMORNING AND AT BEDTIME   FLUoxetine (PROZAC) 10 MG capsule Take 10 mg by mouth every evening.   gabapentin (NEURONTIN) 600 MG tablet Take 1 tablet (600 mg total) by mouth 2 (two) times daily.   HYDROcodone-acetaminophen (NORCO/VICODIN) 5-325 MG tablet Take 1 tablet by mouth every 12 (twelve) hours as needed for moderate pain or severe pain.   levETIRAcetam (KEPPRA) 500 MG tablet Take 500 mg by mouth 2 (two) times daily.   levothyroxine (SYNTHROID) 112 MCG tablet Take 112 mcg by mouth daily.   lidocaine-prilocaine (EMLA) cream Apply to affected area once   meloxicam (MOBIC) 15 MG tablet Take 1 tablet (15 mg total) by mouth daily. Take with largest meal of the day. (Patient taking differently: Take 15 mg by mouth daily with supper. Take with largest meal of  the day.)   Multiple Vitamin (MULTIVITAMIN) tablet Take 1 tablet by mouth daily.   omeprazole (PRILOSEC) 40 MG capsule Take 40 mg by mouth 2 (two) times daily.   Oxycodone HCl 10 MG TABS Take 1 tablet (10 mg total) by mouth every 6 (six) hours. Patient may refuse.   prochlorperazine (COMPAZINE) 10 MG tablet Take 1 tablet (10 mg total) by mouth every 6 (six) hours as needed for nausea or vomiting.   VENTOLIN HFA 108 (90 Base) MCG/ACT inhaler Inhale 1-2 puffs into the lungs every 6 (six) hours as needed (for shortness of breath/wheezing.).   No facility-administered encounter medications on file as of 02/23/2022.   Thank you for the opportunity to participate in the care of Shawn Lindsey.  The palliative care team will continue to follow. Please call our office  at [phone] if we can be of additional assistance.   Jason Coop, NP , DNP, AGPCNP-BC  COVID-19 PATIENT SCREENING TOOL Asked and negative response unless otherwise noted:  Have you had symptoms of covid, tested positive or been in contact with someone with symptoms/positive test in the past 5-10 days?

## 2022-02-24 ENCOUNTER — Telehealth: Payer: PPO | Admitting: Primary Care

## 2022-02-24 ENCOUNTER — Other Ambulatory Visit: Payer: Self-pay | Admitting: Radiology

## 2022-02-24 NOTE — Progress Notes (Signed)
Patient for IR Port Placement on Fri 02/25/22, I called and spoke with the patient on the phone and gave pre-procedure instructions. Pt was  made aware to be here at 12:30p at the new entrance, NPO after MN prior to procedure as well as driver post procedure/recovery/discharge. Pt stated understanding. Called 02/24/22

## 2022-02-25 ENCOUNTER — Encounter: Payer: Self-pay | Admitting: Anesthesiology

## 2022-02-25 ENCOUNTER — Encounter: Payer: Self-pay | Admitting: Radiology

## 2022-02-25 ENCOUNTER — Other Ambulatory Visit: Payer: Self-pay

## 2022-02-25 ENCOUNTER — Ambulatory Visit
Admission: RE | Admit: 2022-02-25 | Discharge: 2022-02-25 | Disposition: A | Discharge status: Home / Self Care | Payer: PPO | Source: Ambulatory Visit | Attending: Oncology | Admitting: Oncology

## 2022-02-25 DIAGNOSIS — Z9981 Dependence on supplemental oxygen: Secondary | ICD-10-CM | POA: Diagnosis not present

## 2022-02-25 DIAGNOSIS — Z8673 Personal history of transient ischemic attack (TIA), and cerebral infarction without residual deficits: Secondary | ICD-10-CM | POA: Insufficient documentation

## 2022-02-25 DIAGNOSIS — Z8589 Personal history of malignant neoplasm of other organs and systems: Secondary | ICD-10-CM | POA: Insufficient documentation

## 2022-02-25 DIAGNOSIS — J449 Chronic obstructive pulmonary disease, unspecified: Secondary | ICD-10-CM | POA: Insufficient documentation

## 2022-02-25 DIAGNOSIS — I10 Essential (primary) hypertension: Secondary | ICD-10-CM | POA: Diagnosis not present

## 2022-02-25 DIAGNOSIS — E119 Type 2 diabetes mellitus without complications: Secondary | ICD-10-CM | POA: Diagnosis not present

## 2022-02-25 DIAGNOSIS — F17210 Nicotine dependence, cigarettes, uncomplicated: Secondary | ICD-10-CM | POA: Diagnosis not present

## 2022-02-25 DIAGNOSIS — Z452 Encounter for adjustment and management of vascular access device: Secondary | ICD-10-CM | POA: Diagnosis not present

## 2022-02-25 DIAGNOSIS — C3411 Malignant neoplasm of upper lobe, right bronchus or lung: Secondary | ICD-10-CM | POA: Diagnosis not present

## 2022-02-25 DIAGNOSIS — C3490 Malignant neoplasm of unspecified part of unspecified bronchus or lung: Secondary | ICD-10-CM | POA: Diagnosis not present

## 2022-02-25 HISTORY — DX: Other specified postprocedural states: Z98.890

## 2022-02-25 HISTORY — PX: IR IMAGING GUIDED PORT INSERTION: IMG5740

## 2022-02-25 LAB — GLUCOSE, CAPILLARY: Glucose-Capillary: 110 mg/dL — ABNORMAL HIGH (ref 70–99)

## 2022-02-25 MED ORDER — LIDOCAINE-EPINEPHRINE 1 %-1:100000 IJ SOLN
INTRAMUSCULAR | Status: AC
  Administered 2022-02-25: 10 mL
  Filled 2022-02-25: qty 1

## 2022-02-25 MED ORDER — FENTANYL CITRATE (PF) 100 MCG/2ML IJ SOLN
INTRAMUSCULAR | Status: AC | PRN
  Administered 2022-02-25: 50 ug via INTRAVENOUS

## 2022-02-25 MED ORDER — MIDAZOLAM HCL 2 MG/2ML IJ SOLN
INTRAMUSCULAR | Status: AC
  Filled 2022-02-25: qty 2

## 2022-02-25 MED ORDER — HEPARIN SOD (PORK) LOCK FLUSH 100 UNIT/ML IV SOLN
INTRAVENOUS | Status: AC
  Administered 2022-02-25: 500 [IU]
  Filled 2022-02-25: qty 5

## 2022-02-25 MED ORDER — SODIUM CHLORIDE 0.9 % IV SOLN: INTRAVENOUS | Status: DC

## 2022-02-25 MED ORDER — MIDAZOLAM HCL 2 MG/2ML IJ SOLN
INTRAMUSCULAR | Status: AC | PRN
  Administered 2022-02-25: 1 mg via INTRAVENOUS

## 2022-02-25 MED ORDER — FENTANYL CITRATE (PF) 100 MCG/2ML IJ SOLN
INTRAMUSCULAR | Status: AC
  Filled 2022-02-25: qty 2

## 2022-02-25 MED FILL — Dexamethasone Sodium Phosphate Inj 100 MG/10ML: INTRAMUSCULAR | Qty: 1 | Status: AC

## 2022-02-25 NOTE — Procedures (Signed)
Interventional Radiology Procedure Note  Date of Procedure: 02/25/2022  Procedure: Chest port placement   Findings:  1. Left chest port placement via Left IJ    Complications: No immediate complications noted.   Estimated Blood Loss: minimal  Follow-up and Recommendations: 1. Port ready for use    Albin Felling, MD  Vascular & Interventional Radiology  02/25/2022 3:07 PM

## 2022-02-25 NOTE — Telephone Encounter (Signed)
Called and spoke to patient and relayed below message.  He stated that PCP ordered oxygen. He now has portable tanks. Nothing further needed at this time.

## 2022-02-25 NOTE — H&P (Signed)
Chief Complaint: Patient was seen in consultation today for image-guided port placement  Referring Physician(s): Finnegan,Timothy J  Supervising Physician: Juliet Rude  Patient Status: [facility] - Out-pt  History of Present Illness: Shawn Lindsey is a 71 y.o. 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 The patient is being seen today for image-guided port placement.  Past Medical History:  Diagnosis Date   Anxiety    mild   Cancer ([place]) 2015   tonsil   COPD (chronic obstructive pulmonary disease) (HCC)    Diabetes mellitus without complication ([place])    controlled with diet   Elevated cholesterol    GERD (gastroesophageal reflux disease)    History of kidney stones    History of thoracentesis    right side   Hypertension    Hypothyroidism    Morbid obesity with BMI of 40.0-44.9, adult ([place])    Pneumonia    klebsiella pneumoniae; extended stay in hospital requiring tracheotomy and gastrotomy   Seizures ([place]) 2015   related to sepsis   Septic shock ([place])    Sleep apnea 2019   has a Cpap machime    Stroke ([place]) 2007   no peripheral vision on left side of both eyes & left arm gets numb under stress    Past Surgical History:  Procedure Laterality Date   CYSTOSCOPY W/ RETROGRADES Right 07/23/2019   Procedure: CYSTOSCOPY WITH RETROGRADE PYELOGRAM;  Surgeon: Abbie Sons, MD;  Location: [facility] ORS;  Service: Urology;  Laterality: Right;   CYSTOSCOPY WITH STENT PLACEMENT Right 07/05/2019   Procedure: CYSTOSCOPY WITH STENT PLACEMENT;  Surgeon: Abbie Sons, MD;  Location: [facility] ORS;  Service: Urology;  Laterality: Right;   CYSTOSCOPY/URETEROSCOPY/HOLMIUM LASER/STENT PLACEMENT Right 07/23/2019   Procedure: CYSTOSCOPY/URETEROSCOPY/HOLMIUM LASER/STENT Exchange;  Surgeon: Abbie Sons, MD;  Location: [facility] ORS;  Service: Urology;  Laterality: Right;   CYSTOSCOPY/URETEROSCOPY/HOLMIUM LASER/STENT PLACEMENT Right 07/30/2019   Procedure: CYSTOSCOPY/URETEROSCOPY/HOLMIUM LASER/STENT Exchange;  Surgeon: Abbie Sons, MD;  Location: [facility] ORS;  Service: Urology;  Laterality: Right;   ELECTROMAGNETIC NAVIGATION BROCHOSCOPY N/A 03/09/2016   Procedure: ELECTROMAGNETIC NAVIGATION BRONCHOSCOPY;  Surgeon: Flora Lipps, MD;  Location: [facility] ORS;  Service: Cardiopulmonary;  Laterality: N/A;   GASTROSTOMY TUBE PLACEMENT     IR IMAGING GUIDED PORT INSERTION  02/25/2022   REMOVAL OF GASTROSTOMY TUBE     TONSILLECTOMY  2015   TRACHEOSTOMY     VIDEO BRONCHOSCOPY WITH ENDOBRONCHIAL NAVIGATION N/A 06/08/2020   Procedure: VIDEO BRONCHOSCOPY WITH ENDOBRONCHIAL NAVIGATION;  Surgeon: Tyler Pita, MD;  Location: [facility] OR[facility];  Service: Pulmonary;  Laterality: N/A;   VIDEO BRONCHOSCOPY WITH ENDOBRONCHIAL ULTRASOUND N/A 06/08/2020   Procedure: VIDEO BRONCHOSCOPY WITH ENDOBRONCHIAL ULTRASOUND;  Surgeon: Tyler Pita, MD;  Location: [facility] ORS;  Service: Pulmonary;  Laterality: N/A;   VIDEO BRONCHOSCOPY WITH ENDOBRONCHIAL ULTRASOUND Right 08/02/2021   Procedure: BRONCHOSCOPY WITH ENDOBRONCHIAL ULTRASOUND;  Surgeon: Tyler Pita, MD;  Location: [facility] ORS;  Service: Pulmonary;  Laterality: Right;    Allergies: Patient has no known allergies.  Medications: Prior to Admission medications   Medication Sig Start Date End Date Taking? Authorizing Provider  albuterol (PROVENTIL) (2.5 MG/3ML) 0.083% nebulizer solution Take 3 mLs (2.5 mg total) by nebulization every 6 (six) hours as needed for wheezing or shortness of breath. 01/25/22  Yes Tyler Pita, MD  amLODipine (NORVASC) 5 MG tablet Take 5 mg by mouth daily.   Yes [provider]  atorvastatin (LIPITOR) 80 MG tablet Take 80 mg by mouth daily at 6 PM.    Yes [provider]  azithromycin (ZITHROMAX) 250 MG tablet TAKE 1  TABLET BY MOUTH 3 TIMES A WEEK TAKE ON MONDAY  [facility]  AND FRIDAY 02/02/22  Yes Tyler Pita, MD  BREZTRI AEROSPHERE 160-9-4.8 MCG/ACT AERO INHALE 2 PUFFS INTO THE LUNGS ONCE [place] AND AT BEDTIME 01/20/22  Yes Tyler Pita, MD  FLUoxetine (PROZAC) 10 MG capsule Take 10 mg by mouth every evening.   Yes [provider]  gabapentin (NEURONTIN) 600 MG tablet Take 1 tablet (600 mg total) by mouth 2 (two) times daily. 01/25/22 01/25/23 Yes Tyler Pita, MD  HYDROcodone-acetaminophen (NORCO/VICODIN) 5-325 MG tablet Take 1 tablet by mouth every 12 (twelve) hours as needed for moderate pain or severe pain. 02/14/22  Yes Wouk, Ailene Rud, MD  ibuprofen (ADVIL) 200 MG tablet Take 800 mg by mouth every 6 (six) hours as needed for mild pain or moderate pain.   Yes [provider]  levETIRAcetam (KEPPRA) 500 MG tablet Take 500 mg by mouth 2 (two) times daily.   Yes [provider]  levothyroxine (SYNTHROID) 112 MCG tablet Take 112 mcg by mouth daily.   Yes [provider]  meloxicam (MOBIC) 15 MG tablet Take 1 tablet (15 mg total) by mouth daily. Take with largest meal of the day. Patient taking differently: Take 15 mg by mouth daily with supper. Take with largest meal of the day. 09/24/21 09/24/22 Yes Tyler Pita, MD  Multiple Vitamin (MULTIVITAMIN) tablet Take 1 tablet by mouth daily.   Yes [provider]  omeprazole (PRILOSEC) 40 MG capsule Take 40 mg by mouth 2 (two) times daily.   Yes [provider]  Oxycodone HCl 10 MG TABS Take 1 tablet (10 mg total) by mouth every 6 (six) hours. Patient may refuse. 02/16/22  Yes Lloyd Huger, MD  VENTOLIN HFA 108 (90 Base) MCG/ACT inhaler Inhale 1-2 puffs into the lungs every 6 (six) hours as needed (for shortness of breath/wheezing.). 12/14/18  Yes Kasa, Maretta Bees, MD  lidocaine-prilocaine (EMLA) cream Apply to affected area once 02/18/22   Lloyd Huger, MD  prochlorperazine  (COMPAZINE) 10 MG tablet Take 1 tablet (10 mg total) by mouth every 6 (six) hours as needed for nausea or vomiting. 02/18/22   Lloyd Huger, MD     Family History  Problem Relation Age of Onset   Diabetes Brother    Dementia Mother     Social History   Socioeconomic History   Marital status: Divorced    Spouse name: Not on file   Number of children: 2   Years of education: Not on file   Highest education level: Not on file  Occupational History   Not on file  Tobacco Use   Smoking status: Every Day    Packs/day: 1.50    Years: 40.00    Total pack years: 60.00    Types: Cigarettes   Smokeless tobacco: Never   Tobacco comments:    1ppd - 12/23/2021  not trying to quit.  12/23/2021 hfb  Vaping Use   Vaping Use: Never used  Substance and Sexual Activity   Alcohol use: No   Drug use: No   Sexual activity: Not on file  Other Topics Concern   Not on file  Social History Narrative  Lives with long time girlfriend   Social Determinants of Health   Financial Resource Strain: Medium Risk (02/22/2022)   Overall Financial Resource Strain (CARDIA)    Difficulty of Paying Living Expenses: Somewhat hard  Food Insecurity: No Food Insecurity (02/22/2022)   Hunger Vital Sign    Worried About Running Out of Food in the Last Year: Never true    Luna in the Last Year: Never true  Transportation Needs: No Transportation Needs (02/22/2022)   PRAPARE - Hydrologist (Medical): No    Lack of Transportation (Non-Medical): No  Physical Activity: Inactive (02/22/2022)   Exercise Vital Sign    Days of Exercise per Week: 0 days    Minutes of Exercise per Session: 0 min  Stress: No Stress Concern Present (02/22/2022)   [place]    Feeling of Stress : Only a little  Social Connections: Moderately Isolated (02/22/2022)   Social Connection and Isolation Panel [NHANES]    Frequency  of Communication with Friends and Family: More than three times a week    Frequency of Social Gatherings with Friends and Family: More than three times a week    Attends Religious Services: Never    Marine scientist or Organizations: No    Attends Archivist Meetings: Never    Marital Status: Living with partner    Review of Systems: A 12 point ROS discussed and pertinent positives are indicated in the HPI above.  All other systems are negative.  Review of Systems  Constitutional:  Negative for chills and fever.  Respiratory:  Positive for shortness of breath. Negative for chest tightness.   Cardiovascular:  Negative for chest pain and leg swelling.  Gastrointestinal:  Negative for diarrhea, nausea and vomiting.  Neurological:  Negative for dizziness and headaches.  Psychiatric/Behavioral:  Negative for confusion.     Vital Signs: BP (!) 140/69   Pulse 92   Temp 98.6 F (37 C) (Oral)   Resp 14   Ht 5\' 10"  (1.778 m)   Wt 290 lb (131.5 kg)   SpO2 91%   BMI 41.61 kg/m    Physical Exam Vitals reviewed.  Constitutional:      General: He is not in acute distress.    Appearance: He is obese.  HENT:     Mouth/Throat:     Mouth: Mucous membranes are moist.  Cardiovascular:     Rate and Rhythm: Normal rate and regular rhythm.     Pulses: Normal pulses.     Heart sounds: Normal heart sounds.  Pulmonary:     Effort: Pulmonary effort is normal.     Comments: Patient on 4L O2 via [place]. Decreased breath sounds on right upper and right mid lung fields Neurological:     Mental Status: He is alert and oriented to person, place, and time.  Psychiatric:        Mood and Affect: Mood normal.        Behavior: Behavior normal.     Imaging: IR IMAGING GUIDED PORT INSERTION  Result Date: 02/25/2022 INDICATION: Lung cancer EXAM: Chest port placement using ultrasound and fluoroscopic guidance MEDICATIONS: As documented in the EMR ANESTHESIA/SEDATION: Moderate (conscious)  sedation was employed during this procedure. A total of Versed 1 mg and Fentanyl 50 mcg was administered intravenously. Moderate Sedation Time: 42 minutes. The patient's level of consciousness and vital signs were monitored continuously by radiology nursing throughout the  procedure under my direct supervision. FLUOROSCOPY TIME:  Fluoroscopy Time: 2.8 minutes (66 mGy) COMPLICATIONS: None immediate. PROCEDURE: Informed written consent was obtained from the patient after a thorough discussion of the procedural risks, benefits and alternatives. All questions were addressed. Maximal Sterile Barrier Technique was utilized including caps, mask, sterile gowns, sterile gloves, sterile drape, hand hygiene and skin antiseptic. A timeout was performed prior to the initiation of the procedure. The patient was placed supine on the exam table. The left neck and chest was prepped and draped in the standard sterile fashion. A preliminary ultrasound of the left neck was performed and demonstrates a patent left internal jugular vein. A permanent ultrasound image was stored in the electronic medical record. The overlying skin was anesthetized with 1% Lidocaine. Using ultrasound guidance, access was obtained into the left internal jugular vein using a 21 gauge micropuncture set. A wire was advanced into the SVC, a short incision was made at the puncture site, and serial dilatation performed. Next, in an ipsilateral infraclavicular location, an incision was made at the site of the subcutaneous reservoir. Blunt dissection was used to open a pocket to contain the reservoir. A subcutaneous tunnel was then created from the port site to the puncture site. A(n) 8 Fr single lumen catheter was advanced through the tunnel. The catheter was attached to the port and this was placed in the subcutaneous pocket. Under fluoroscopic guidance, a peel away sheath was placed, and the catheter was trimmed to the appropriate length and was advanced into the  central veins. The catheter length is 30 cm. The tip of the catheter lies near the superior cavoatrial junction. The port flushes and aspirates appropriately. The port was flushed and locked with heparinized saline. The port pocket was closed in 2 layers using 3-0 and 4-0 Vicryl/absorbable suture. Dermabond was also applied to both incisions. The patient tolerated the procedure well and was transferred to recovery in stable condition. IMPRESSION: Successful placement of a left-sided chest port via the left internal jugular vein. The port is ready for immediate use. Electronically Signed   By: Albin Felling M.D.   On: 02/25/2022 15:47   CT HEAD W & WO CONTRAST (5MM)  Result Date: 02/11/2022 CLINICAL DATA:  Pulmonary lesion concerning for adenocarcinoma concern for metastatic disease. EXAM: CT HEAD WITHOUT AND WITH CONTRAST TECHNIQUE: Contiguous axial images were obtained from the base of the skull through the vertex without and with intravenous contrast. RADIATION DOSE REDUCTION: This exam was performed according to the departmental dose-optimization program which includes automated exposure control, adjustment of the mA and/or kV according to patient size and/or use of iterative reconstruction technique. CONTRAST:  82mL OMNIPAQUE IOHEXOL 300 MG/ML  SOLN COMPARISON:  CT head 07/07/2019 FINDINGS: Brain: There is no acute intracranial hemorrhage, extra-axial fluid collection, or acute infarct. There is background parenchymal volume loss with prominence of the ventricular system and extra-axial CSF spaces. There is a remote infarct in the right PCA distribution with associated ex vacuo dilatation of the right lateral ventricle, unchanged. There are remote lacunar infarcts in the right lentiform nucleus and left caudate body, unchanged. Additional patchy hypodensity in the supratentorial white matter likely reflects sequela of underlying chronic small vessel ischemic change. There is no mass lesion or abnormal  enhancement. There is no mass effect or midline shift. Vascular: There is calcification of the bilateral carotid siphons. Skull: Normal. Negative for fracture or focal lesion. Sinuses/Orbits: There is mucosal thickening in the left maxillary sinus. Globes and orbits are unremarkable. Other:  There is a trace right mastoid effusion. Imaged right nasopharynx is unremarkable. IMPRESSION: No evidence of intracranial metastatic disease. Electronically Signed   By: Valetta Mole M.D.   On: 02/11/2022 16:47   ECHOCARDIOGRAM COMPLETE  Result Date: 02/08/2022    ECHOCARDIOGRAM REPORT   Patient Name:   Shawn Lindsey Date of Exam: 02/08/2022 Medical Rec #:  [id number]      Height:       70.0 in Accession #:    [id number]     Weight:       303.0 lb Date of Birth:  03-May-1950      BSA:          2.490 m Patient Age:    27 years       BP:           121/61 mmHg Patient Gender: M              HR:           85 bpm. Exam Location:  [facility] Procedure: 2D Echo, Cardiac Doppler and Color Doppler Indications:     I50.31 CHF Acute Diastolic  History:         Patient has no prior history of Echocardiogram examinations.                  COPD; Risk Factors:Current Smoker, Hypertension, Dyslipidemia                  and Diabetes.  Sonographer:     Rosalia Hammers Referring Phys:  [id number] CARMEN L GONZALEZ Diagnosing Phys: Yolonda Kida MD  Sonographer Comments: Technically difficult study due to poor echo windows. Image acquisition challenging due to patient body habitus, Image acquisition challenging due to respiratory motion and Image acquisition challenging due to COPD. IMPRESSIONS  1. Left ventricular ejection fraction, by estimation, is 45 to 50%. The left ventricle has mildly decreased function. The left ventricle has no regional wall motion abnormalities. Left ventricular diastolic parameters are consistent with Grade I diastolic dysfunction (impaired relaxation).  2. Right ventricular systolic function is normal. The right ventricular  size is normal.  3. The mitral valve is normal in structure. Trivial mitral valve regurgitation.  4. The aortic valve is normal in structure. Aortic valve regurgitation is not visualized. Aortic valve sclerosis is present, with no evidence of aortic valve stenosis. FINDINGS  Left Ventricle: Left ventricular ejection fraction, by estimation, is 45 to 50%. The left ventricle has mildly decreased function. The left ventricle has no regional wall motion abnormalities. The left ventricular internal cavity size was normal in size. There is no left ventricular hypertrophy. Left ventricular diastolic parameters are consistent with Grade I diastolic dysfunction (impaired relaxation). Right Ventricle: The right ventricular size is normal. No increase in right ventricular wall thickness. Right ventricular systolic function is normal. Left Atrium: Left atrial size was normal in size. Right Atrium: Right atrial size was normal in size. Pericardium: There is no evidence of pericardial effusion. Mitral Valve: The mitral valve is normal in structure. Trivial mitral valve regurgitation. Tricuspid Valve: The tricuspid valve is normal in structure. Tricuspid valve regurgitation is trivial. Aortic Valve: The aortic valve is normal in structure. Aortic valve regurgitation is not visualized. Aortic valve sclerosis is present, with no evidence of aortic valve stenosis. Aortic valve mean gradient measures 7.0 mmHg. Aortic valve peak gradient measures 12.5 mmHg. Aortic valve area, by VTI measures 2.74 cm. Pulmonic Valve: The pulmonic valve was normal in structure. Pulmonic valve regurgitation  is not visualized. Aorta: The ascending aorta was not well visualized. IAS/Shunts: No atrial level shunt detected by color flow Doppler.  LEFT VENTRICLE PLAX 2D LVIDd:         5.00 cm   Diastology LVIDs:         3.90 cm   LV e' medial:    18.90 cm/s LV PW:         1.10 cm   LV E/e' medial:  6.3 LV IVS:        1.10 cm   LV e' lateral:   10.30 cm/s LVOT  diam:     2.50 cm   LV E/e' lateral: 11.7 LV SV:         91 LV SV Index:   37 LVOT Area:     4.91 cm  RIGHT VENTRICLE RV Basal diam:  4.10 cm RV Mid diam:    2.80 cm RV S prime:     13.80 cm/s TAPSE (M-mode): 1.9 cm LEFT ATRIUM             Index        RIGHT ATRIUM           Index LA diam:        3.20 cm 1.28 cm/m   RA Area:     14.90 cm LA Vol (A2C):   48.8 ml 19.60 ml/m  RA Volume:   36.40 ml  14.62 ml/m LA Vol (A4C):   65.2 ml 26.18 ml/m LA Biplane Vol: 58.2 ml 23.37 ml/m  AORTIC VALVE AV Area (Vmax):    2.86 cm AV Area (Vmean):   2.75 cm AV Area (VTI):     2.74 cm AV Vmax:           177.00 cm/s AV Vmean:          118.000 cm/s AV VTI:            0.333 m AV Peak Grad:      12.5 mmHg AV Mean Grad:      7.0 mmHg LVOT Vmax:         103.00 cm/s LVOT Vmean:        66.200 cm/s LVOT VTI:          0.186 m LVOT/AV VTI ratio: 0.56  AORTA Ao Root diam: 4.20 cm MITRAL VALVE MV Area (PHT): 3.50 cm     SHUNTS MV Decel Time: 217 msec     Systemic VTI:  0.19 m MV E velocity: 120.00 cm/s  Systemic Diam: 2.50 cm MV A velocity: 122.00 cm/s MV E/A ratio:  0.98 Dwayne D Callwood MD Electronically signed by Yolonda Kida MD Signature Date/Time: 02/08/2022/5:03:18 PM    Final    DG Chest 2 View  Result Date: 02/08/2022 CLINICAL DATA:  Pleural effusion EXAM: CHEST - 2 VIEW COMPARISON:  Chest x-ray dated February 07, 2022 FINDINGS: Cardiac and mediastinal contours are unchanged. Volume loss of the right hemithorax with persistent moderate loculated right pleural effusion and bilateral patchy airspace opacities. No evidence of pneumothorax. IMPRESSION: Unchanged moderate loculated right pleural effusion and bilateral patchy airspace opacities. Electronically Signed   By: Yetta Glassman M.D.   On: 02/08/2022 13:44   DG Chest Port 1 View  Result Date: 02/07/2022 CLINICAL DATA:  Status post right thoracentesis. EXAM: PORTABLE CHEST 1 VIEW COMPARISON:  02/05/2022 FINDINGS: Status post right thoracentesis with slight  improved aeration of the residual right lung. No pneumothorax. Stable appearance of left lung. IMPRESSION: Status post right  thoracentesis with slight improved aeration of the residual right lung. No pneumothorax. Electronically Signed   By: Aletta Edouard M.D.   On: 02/07/2022 12:04   US THORACENTESIS ASP PLEURAL SPACE W/IMG GUIDE  Result Date: 02/07/2022 INDICATION: 71 year old male inpatient smoker. History of COPD, diabetes, hypertension, morbid obesity, CVA. With a known right upper lobe lung mass. Admitted for acute on chronic respiratory failure found to have a right-sided pleural effusion. Request is for therapeutic and diagnostic right-sided thoracentesis EXAM: ULTRASOUND GUIDED THERAPEUTIC AND DIAGNOSTIC RIGHT-SIDED THORACENTESIS MEDICATIONS: LIDOCAINE 1% 10 ML COMPLICATIONS: None immediate. PROCEDURE: An ultrasound guided thoracentesis was thoroughly discussed with the patient and questions answered. The benefits, risks, alternatives and complications were also discussed. The patient understands and wishes to proceed with the procedure. Written consent was obtained. Ultrasound was performed to localize and mark an adequate pocket of fluid in the right chest. The area was then prepped and draped in the normal sterile fashion. 1% Lidocaine was used for local anesthesia. Under ultrasound guidance a 6 Fr Safe-T-Centesis catheter was introduced. Thoracentesis was performed. The catheter was removed and a dressing applied. FINDINGS: A total of approximately 1.5 L of straw-colored fluid was removed. Samples were sent to the laboratory as requested by the clinical team. IMPRESSION: Successful ultrasound guided therapeutic and diagnostic right-sided thoracentesis yielding of pleural fluid. Read by: Rushie Nyhan, NP Electronically Signed   By: Corrie Mckusick D.O.   On: 02/07/2022 12:02   CT Angio Chest PE W and/or Wo Contrast  Result Date: 02/05/2022 CLINICAL DATA:  Increased shortness of breath.  Smoker. Reports right upper lung collapse for 3 weeks. EXAM: CT ANGIOGRAPHY CHEST WITH CONTRAST TECHNIQUE: Multidetector CT imaging of the chest was performed using the standard protocol during bolus administration of intravenous contrast. Multiplanar CT image reconstructions and MIPs were obtained to evaluate the vascular anatomy. RADIATION DOSE REDUCTION: This exam was performed according to the departmental dose-optimization program which includes automated exposure control, adjustment of the mA and/or kV according to patient size and/or use of iterative reconstruction technique. CONTRAST:  156mL OMNIPAQUE IOHEXOL 350 MG/ML SOLN COMPARISON:  Current chest radiograph.  Chest CT, 12/21/2021. FINDINGS: Cardiovascular: Pulmonary arteries are well opacified. Respiratory motion mildly limits assessment of the smaller segmental/subsegmental vessels, particularly to the left lower lobe. Allowing for this, there is no evidence of a pulmonary embolism. Heart is normal in size. Left and circumflex coronary artery calcifications. No pericardial effusion. Aorta normal in caliber. Aortic atherosclerosis. No dissection. Mediastinum/Nodes: Mild mediastinal adenopathy. Precarinal node measures 1.7 cm short axis. Subcarinal node measures 1.7 cm in short axis. Enlarged left hilar node, 1.4 cm in short axis. No mediastinal or hilar masses. Trachea is partly collapse, exam appears extra tori. Trachea otherwise unremarkable. Normal esophagus. Lungs/Pleura: Large moderate right pleural effusion. Right upper lobe bronchus is occluded. There is surrounding soft tissue, contiguous with the partly, which could reflect a mass, approximately 4 x 3.4 cm transversely. There is patchy airspace consolidation in the right lower lobe collapsed right upper lobe. Right middle lobe is collapsed. Additional patchy opacities noted in the right upper lobe. Patchy areas of ground-glass opacity are noted in the left lower lobe and anteromedial left upper  lobe. 8 mm nodule, subpleural, left lower lobe, image 114/5. Moderate centrilobular emphysema. No left pleural effusion. No pneumothorax. Upper Abdomen: No acute findings. No visualized liver or adrenal mass. Dependent gallstone. Musculoskeletal: No fracture or acute finding. No bone lesion. No chest wall mass. Review of the MIP images confirms the above findings.  IMPRESSION: 1. No evidence of a pulmonary embolism. 2. Possible central mass versus collapsed lung along the superior right hilum, where the right upper lobe bronchus is occluded. 3. Patchy areas of right upper and lower lobe opacity consistent with multifocal pneumonia. 4. Moderate right pleural effusion. 5. Patchy ground-glass type opacities in the left upper and left lower lobe may reflect additional areas of infection, but are similar to the prior CT, and may be chronic. 6. 8 mm left lower lobe nodule. 7. Mediastinal and left hilar lymphadenopathy unchanged from the prior CT. 8. Coronary artery calcifications and aortic atherosclerosis. Aortic Atherosclerosis (ICD10-I70.0) and Emphysema (ICD10-J43.9). Electronically Signed   By: Lajean Manes M.D.   On: 02/05/2022 16:41   DG Chest Portable 1 View  Result Date: 02/05/2022 CLINICAL DATA:  Increasing shortness of breath EXAM: PORTABLE CHEST 1 VIEW COMPARISON:  Previous chest radiographs done on 08/02/2021 and CT chest done on 12/21/2021 FINDINGS: Transverse diameter of heart is increased. There is slight prominence of interstitial markings in left lung. There is decreased volume in right lung. There is pleural density in the periphery of right lung. There is homogeneous opacity in right upper and right lower lung fields. Left lateral CP angle is clear. There is no pneumothorax. IMPRESSION: There is interval decrease in volume in right lung along with increasing amount of right pleural effusion. Part of the increased opacity in right lung may be due to underlying neoplastic process and pneumonia.  Prominence of interstitial markings in left lung may suggest interstitial edema or interstitial pneumonia. Electronically Signed   By: Elmer Picker M.D.   On: 02/05/2022 14:57    Labs:  CBC: Recent Labs    02/08/22 0456 02/10/22 0505 02/11/22 0638 02/12/22 0536  WBC 16.8* 14.3* 13.7* 13.8*  HGB 13.6 14.8 16.6 14.5  HCT 44.0 46.2 52.7* 45.3  PLT 223 206 200 197    COAGS: No results for input(s): "INR", "APTT" in the last 8760 hours.  BMP: Recent Labs    02/08/22 0456 02/10/22 0505 02/11/22 0638 02/12/22 0536  NA 140 140 144 141  K 5.0 4.4 5.0 3.7  CL 102 102 100 103  CO2 28 29 34* 32  GLUCOSE 130* 165* 116* 105*  BUN 25* 29* 32* 31*  CALCIUM 9.1 9.0 9.9 8.8*  CREATININE 1.14 0.92 1.19 1.12  GFRNONAA >60 >60 >60 >60    LIVER FUNCTION TESTS: Recent Labs    02/05/22 1445  BILITOT 0.7  AST 24  ALT 18  ALKPHOS 85  PROT 6.6  ALBUMIN 3.6    TUMOR MARKERS: No results for input(s): "AFPTM", "CEA", "CA199", "CHROMGRNA" in the last 8760 hours.  Assessment and Plan:  Shawn Lindsey is a 71 yo male being seen today for left-sided image-guided port placement. The patient was referred to IR following imaging suggestive of lung cancer. The case has been reviewed and is set to proceed with Dr Denna Haggard on 02/25/22.  Risks and benefits of image guided port-a-catheter placement was discussed with the patient including, but not limited to bleeding, infection, pneumothorax, or fibrin sheath development and need for additional procedures.  All of the patient's questions were answered, patient is agreeable to proceed. Consent signed and in chart.   Thank you for this interesting consult.  I greatly enjoyed meeting Shawn Lindsey and look forward to participating in their care.  A copy of this report was sent to the requesting provider on this date.  Electronically Signed: Lura Em, [place] 02/25/2022, 4:45 PM  I spent a total of  30 Minutes   in face to face in  clinical consultation, greater than 50% of which was counseling/coordinating care for image-guided port placement.

## 2022-02-26 ENCOUNTER — Telehealth: Payer: Self-pay | Admitting: Oncology

## 2022-02-26 DIAGNOSIS — T23201A Burn of second degree of right hand, unspecified site, initial encounter: Secondary | ICD-10-CM | POA: Diagnosis not present

## 2022-02-26 DIAGNOSIS — T2132XA Burn of third degree of abdominal wall, initial encounter: Secondary | ICD-10-CM | POA: Diagnosis not present

## 2022-02-26 DIAGNOSIS — T2122XA Burn of second degree of abdominal wall, initial encounter: Secondary | ICD-10-CM | POA: Diagnosis not present
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
## 2022-02-28 ENCOUNTER — Other Ambulatory Visit: Payer: Self-pay | Admitting: Oncology

## 2022-02-28 ENCOUNTER — Ambulatory Visit
Admission: RE | Admit: 2022-02-28 | Discharge: 2022-02-28 | Disposition: A | Discharge status: Home / Self Care | Payer: PPO | Source: Ambulatory Visit | Attending: Radiation Oncology | Admitting: Radiation Oncology

## 2022-02-28 ENCOUNTER — Other Ambulatory Visit: Payer: Self-pay | Admitting: *Deleted

## 2022-02-28 ENCOUNTER — Encounter: Payer: Self-pay | Admitting: Oncology

## 2022-02-28 ENCOUNTER — Inpatient Hospital Stay (HOSPITAL_BASED_OUTPATIENT_CLINIC_OR_DEPARTMENT_OTHER): Payer: PPO | Admitting: Oncology

## 2022-02-28 ENCOUNTER — Other Ambulatory Visit: Payer: Self-pay

## 2022-02-28 ENCOUNTER — Ambulatory Visit
Admission: RE | Admit: 2022-02-28 | Discharge: 2022-02-28 | Disposition: A | Discharge status: Home / Self Care | Payer: PPO | Source: Ambulatory Visit | Attending: Oncology | Admitting: Oncology

## 2022-02-28 ENCOUNTER — Emergency Department
Admission: EM | Admit: 2022-02-28 | Discharge: 2022-02-28 | Discharge status: Against Medical Advice | Payer: PPO | Attending: Family Medicine | Admitting: Family Medicine

## 2022-02-28 ENCOUNTER — Inpatient Hospital Stay: Payer: PPO

## 2022-02-28 ENCOUNTER — Emergency Department: Payer: PPO

## 2022-02-28 ENCOUNTER — Inpatient Hospital Stay (HOSPITAL_BASED_OUTPATIENT_CLINIC_OR_DEPARTMENT_OTHER): Payer: PPO | Admitting: Medical Oncology

## 2022-02-28 ENCOUNTER — Encounter: Payer: Self-pay | Admitting: *Deleted

## 2022-02-28 VITALS — BP 85/57 | HR 82

## 2022-02-28 VITALS — BP 111/79 | HR 88 | Temp 97.5°F | Resp 16 | Ht 70.0 in | Wt 303.0 lb

## 2022-02-28 DIAGNOSIS — C3491 Malignant neoplasm of unspecified part of right bronchus or lung: Secondary | ICD-10-CM

## 2022-02-28 DIAGNOSIS — E860 Dehydration: Secondary | ICD-10-CM

## 2022-02-28 DIAGNOSIS — Z5321 Procedure and treatment not carried out due to patient leaving prior to being seen by health care provider: Secondary | ICD-10-CM | POA: Insufficient documentation

## 2022-02-28 DIAGNOSIS — Z95828 Presence of other vascular implants and grafts: Secondary | ICD-10-CM

## 2022-02-28 DIAGNOSIS — R062 Wheezing: Secondary | ICD-10-CM | POA: Diagnosis not present

## 2022-02-28 DIAGNOSIS — Z85118 Personal history of other malignant neoplasm of bronchus and lung: Secondary | ICD-10-CM | POA: Insufficient documentation

## 2022-02-28 DIAGNOSIS — F17210 Nicotine dependence, cigarettes, uncomplicated: Secondary | ICD-10-CM | POA: Diagnosis not present

## 2022-02-28 DIAGNOSIS — C3411 Malignant neoplasm of upper lobe, right bronchus or lung: Secondary | ICD-10-CM | POA: Diagnosis not present

## 2022-02-28 DIAGNOSIS — R0602 Shortness of breath: Secondary | ICD-10-CM | POA: Diagnosis not present

## 2022-02-28 DIAGNOSIS — Z510 Encounter for antineoplastic radiation therapy: Secondary | ICD-10-CM | POA: Diagnosis not present

## 2022-02-28 DIAGNOSIS — Z85818 Personal history of malignant neoplasm of other sites of lip, oral cavity, and pharynx: Secondary | ICD-10-CM | POA: Diagnosis not present

## 2022-02-28 DIAGNOSIS — J449 Chronic obstructive pulmonary disease, unspecified: Secondary | ICD-10-CM | POA: Diagnosis not present

## 2022-02-28 DIAGNOSIS — M6281 Muscle weakness (generalized): Secondary | ICD-10-CM

## 2022-02-28 LAB — COMPREHENSIVE METABOLIC PANEL
ALT: 53 U/L — ABNORMAL HIGH (ref 0–44)
ALT: 55 U/L — ABNORMAL HIGH (ref 0–44)
AST: 43 U/L — ABNORMAL HIGH (ref 15–41)
AST: 47 U/L — ABNORMAL HIGH (ref 15–41)
Albumin: 3.4 g/dL — ABNORMAL LOW (ref 3.5–5.0)
Albumin: 3.4 g/dL — ABNORMAL LOW (ref 3.5–5.0)
Alkaline Phosphatase: 93 U/L (ref 38–126)
Alkaline Phosphatase: 90 U/L (ref 38–126)
Anion gap: 5 (ref 5–15)
Anion gap: 10 (ref 5–15)
BUN: 22 mg/dL (ref 8–23)
BUN: 22 mg/dL (ref 8–23)
CO2: 31 mmol/L (ref 22–32)
CO2: 30 mmol/L (ref 22–32)
Calcium: 8.8 mg/dL — ABNORMAL LOW (ref 8.9–10.3)
Calcium: 9.1 mg/dL (ref 8.9–10.3)
Chloride: 100 mmol/L (ref 98–111)
Chloride: 100 mmol/L (ref 98–111)
Creatinine, Ser: 1.68 mg/dL — ABNORMAL HIGH (ref 0.61–1.24)
Creatinine, Ser: 1.76 mg/dL — ABNORMAL HIGH (ref 0.61–1.24)
GFR, Estimated: 43 mL/min — ABNORMAL LOW (ref >60)
GFR, Estimated: 41 mL/min — ABNORMAL LOW (ref >60)
Glucose, Bld: 129 mg/dL — ABNORMAL HIGH (ref 70–99)
Glucose, Bld: 115 mg/dL — ABNORMAL HIGH (ref 70–99)
Potassium: 4.9 mmol/L (ref 3.5–5.1)
Potassium: 5 mmol/L (ref 3.5–5.1)
Sodium: 136 mmol/L (ref 135–145)
Sodium: 140 mmol/L (ref 135–145)
Total Bilirubin: 0.4 mg/dL (ref 0.3–1.2)
Total Bilirubin: 0.4 mg/dL (ref 0.3–1.2)
Total Protein: 6.9 g/dL (ref 6.5–8.1)
Total Protein: 6.9 g/dL (ref 6.5–8.1)

## 2022-02-28 LAB — CBC WITH DIFFERENTIAL/PLATELET
Abs Immature Granulocytes: 0.05 10*3/uL (ref 0.00–0.07)
Abs Immature Granulocytes: 0.07 10*3/uL (ref 0.00–0.07)
Basophils Absolute: 0 10*3/uL (ref 0.0–0.1)
Basophils Absolute: 0 10*3/uL (ref 0.0–0.1)
Basophils Relative: 0 %
Basophils Relative: 0 %
Eosinophils Absolute: 0.3 10*3/uL (ref 0.0–0.5)
Eosinophils Absolute: 0.3 10*3/uL (ref 0.0–0.5)
Eosinophils Relative: 3 %
Eosinophils Relative: 2 %
HCT: 44.5 % (ref 39.0–52.0)
HCT: 44.5 % (ref 39.0–52.0)
Hemoglobin: 13.7 g/dL (ref 13.0–17.0)
Hemoglobin: 13.8 g/dL (ref 13.0–17.0)
Immature Granulocytes: 0 %
Immature Granulocytes: 1 %
Lymphocytes Relative: 6 %
Lymphocytes Relative: 7 %
Lymphs Abs: 0.8 10*3/uL (ref 0.7–4.0)
Lymphs Abs: 0.8 10*3/uL (ref 0.7–4.0)
MCH: 28.9 pg (ref 26.0–34.0)
MCH: 28.6 pg (ref 26.0–34.0)
MCHC: 30.8 g/dL (ref 30.0–36.0)
MCHC: 31 g/dL (ref 30.0–36.0)
MCV: 93.9 fL (ref 80.0–100.0)
MCV: 92.3 fL (ref 80.0–100.0)
Monocytes Absolute: 0.9 10*3/uL (ref 0.1–1.0)
Monocytes Absolute: 0.6 10*3/uL (ref 0.1–1.0)
Monocytes Relative: 7 %
Monocytes Relative: 5 %
Neutro Abs: 10.8 10*3/uL — ABNORMAL HIGH (ref 1.7–7.7)
Neutro Abs: 10.2 10*3/uL — ABNORMAL HIGH (ref 1.7–7.7)
Neutrophils Relative %: 84 %
Neutrophils Relative %: 85 %
Platelets: 205 10*3/uL (ref 150–400)
Platelets: 226 10*3/uL (ref 150–400)
RBC: 4.74 MIL/uL (ref 4.22–5.81)
RBC: 4.82 MIL/uL (ref 4.22–5.81)
RDW: 16.3 % — ABNORMAL HIGH (ref 11.5–15.5)
RDW: 16.2 % — ABNORMAL HIGH (ref 11.5–15.5)
WBC: 12.9 10*3/uL — ABNORMAL HIGH (ref 4.0–10.5)
WBC: 12 10*3/uL — ABNORMAL HIGH (ref 4.0–10.5)
nRBC: 0 % (ref 0.0–0.2)
nRBC: 0 % (ref 0.0–0.2)

## 2022-02-28 MED ORDER — PALONOSETRON HCL INJECTION 0.25 MG/5ML: 0.2500 mg | Freq: Once | INTRAVENOUS | Status: DC

## 2022-02-28 MED ORDER — FAMOTIDINE IN NACL 20-0.9 MG/50ML-% IV SOLN: 20.0000 mg | Freq: Once | INTRAVENOUS | Status: DC

## 2022-02-28 MED ORDER — SODIUM CHLORIDE 0.9 % IV SOLN
Freq: Once | INTRAVENOUS | Status: AC
  Filled 2022-02-28: qty 250

## 2022-02-28 MED ORDER — BENZONATATE 100 MG PO CAPS: 100.0000 mg | ORAL_CAPSULE | Freq: Three times a day (TID) | ORAL | 0 refills | Status: AC | PRN

## 2022-02-28 MED ORDER — SODIUM CHLORIDE 0.9 % IV SOLN
45.0000 mg/m2 | Freq: Once | INTRAVENOUS | Status: DC
  Filled 2022-02-28: qty 20

## 2022-02-28 MED ORDER — SODIUM CHLORIDE 0.9 % IV SOLN
206.8000 mg | Freq: Once | INTRAVENOUS | Status: DC
  Filled 2022-02-28: qty 21

## 2022-02-28 MED ORDER — DIPHENHYDRAMINE HCL 50 MG/ML IJ SOLN: 25.0000 mg | Freq: Once | INTRAMUSCULAR | Status: DC

## 2022-02-28 MED ORDER — SODIUM CHLORIDE 0.9 % IV SOLN
10.0000 mg | Freq: Once | INTRAVENOUS | Status: AC
  Administered 2022-02-28: 10 mg via INTRAVENOUS
  Filled 2022-02-28: qty 10

## 2022-02-28 MED ORDER — ALTEPLASE 2 MG IJ SOLR
2.0000 mg | Freq: Once | INTRAMUSCULAR | Status: AC | PRN
  Administered 2022-02-28: 2 mg
  Filled 2022-02-28: qty 2

## 2022-02-28 NOTE — Progress Notes (Signed)
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 chest wall mass. Review of the MIP images confirms the above findings. IMPRESSION: 1. No evidence of a pulmonary embolism. 2. Possible central mass versus collapsed lung along the superior right hilum, where the right upper lobe bronchus is occluded. 3. Patchy areas of right upper and lower lobe opacity consistent with multifocal pneumonia. 4. Moderate right pleural effusion. 5. Patchy ground-glass type opacities in the left upper and left lower lobe may reflect additional areas of infection, but are similar to the prior CT, and may be chronic. 6. 8 mm left lower lobe nodule. 7. Mediastinal and left hilar lymphadenopathy unchanged from the prior CT. 8. Coronary artery calcifications and aortic atherosclerosis. Aortic Atherosclerosis (ICD10-I70.0) and Emphysema (ICD10-J43.9). Electronically Signed   By: Lajean Manes M.D.   On: 02/05/2022 16:41   DG Chest Portable 1 View  Result Date: 02/05/2022 CLINICAL DATA:  Increasing shortness of breath EXAM: PORTABLE CHEST 1 VIEW COMPARISON:  Previous chest radiographs done on 08/02/2021 and CT chest done on 12/21/2021 FINDINGS: Transverse diameter of heart is increased. There is slight prominence of interstitial markings in  left lung. There is decreased volume in right lung. There is pleural density in the periphery of right lung. There is homogeneous opacity in right upper and right lower lung fields. Left lateral CP angle is clear. There is no pneumothorax. IMPRESSION: There is interval decrease in volume in right lung along with increasing amount of right pleural effusion. Part of the increased opacity in right lung may be due to underlying neoplastic process and pneumonia. Prominence of interstitial markings in left lung may suggest interstitial edema or interstitial pneumonia. Electronically Signed   By: Elmer Picker M.D.   On: 02/05/2022 14:57    PERFORMANCE STATUS (ECOG) : 2 - Symptomatic, <50% confined to bed  Review of Systems Unless otherwise noted, a complete review of systems is negative.  Physical Exam General: NAD. Obese. Patient does not appear to be in distress. He is talking in a normal speech pattern but does have audible wheezing when he takes a deep breath.  Cardiovascular: regular rate and rhythm Pulmonary: Moderate wheezing present throughout  Abdomen: soft, nontender, + bowel sounds GU: no suprapubic tenderness Extremities: no edema, no joint deformities Skin: no rashes Neurological: Weakness but otherwise nonfocal  Assessment and Plan- Patient is a 71 y.o. male    Encounter Diagnosis  Name Primary?   Wheezing Yes   New following IV fluids. Oxygen 97 on 3L when not talking. Dropping to low 90s when talking. Concerning for possible pulmonary edema/flash pulmonary edema. Repeat BP reveals hypotension- not a candidate for IV lasix in this setting given this and his elevated creatinine. We do not have an inhaler for him to trial. IV steroids could be considered however given the timing of his SOB and his medical history I believe that he will need further monitoring and imaging that we do not have access to here. He is agreeable to transportation to the ER. Wheelchair preferred per  patient as he does not have symptomatic hypotension, symptoms are not worsening and he has no chest pain.     Patient  expressed understanding and was in agreement with this plan. He also understands that He can call clinic at any time with any questions, concerns, or complaints.   Thank you for allowing me to participate in the care of this very pleasant patient.   Time Total: 15  Visit consisted of counseling and education dealing with the complex and emotionally intense issues of symptom management in the setting of serious illness.Greater than 50%  of this time was spent counseling and coordinating care related to the above assessment and plan.  Signed by: Nelwyn Salisbury, PA-C

## 2022-02-28 NOTE — Progress Notes (Signed)
Patient came in to the [facility] today for his 1st treatment with Taxol and Carboplatin. Patient had new port a cath placed last week. Patient's port was accessed with no blood return noted. Per MD, RN instilled cathflo into port line. Patient's labs were drawn peripherally. Patient saw MD and was brought to infusion via wheelchair for his treatment. Port was checked and no blood return noted. Peripheral IV started in the left [facility]. Patient's creatinine was noted to be elevated, so per MD orders, RN gave 1 liter of NS over 1 hour, prior to starting treatment. After IV fluids, RN started dexamethasone and noticed that patient was audibly wheezing. O2 sats remained in the 90's on 3L O2. Patient stated that he noticed more wheezing than usual, but did not complain of shortness of breath. APP was called to evaluate patient. BP rechecked and was 85/57. APP also noted wheezing. Per APP, Patient to be transported to ED for further evaluation of fluid overload/wheezing and hypotension. No chemotherapy was given today. Port was left accessed for dye study to be performed at 2:30 today in vascular intervention. IV was saline locked. Patient transported to ED via wheelchair by 2 RN's and report given to RN in ED.

## 2022-02-28 NOTE — ED Triage Notes (Signed)
[facility], was told that creatinine is elevate and was given 1L fluid, was told that fluid went to his lung. Pt reports SOB but no more than usual h/o COPD and lung CA on 3LNC all day. Port placed on Friday. Denies chest pain, no dizziness. Pt talking in full sentences during triage.
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
## 2022-02-28 NOTE — ED Provider Triage Note (Signed)
Emergency Medicine Provider Triage Evaluation Note  Shawn Lindsey , a 71 y.o. male  was evaluated in triage.  Pt was sent to the ER from the cancer center due to increase in wheezing. He was noted to be dehydrated and received a liter of fluid.  Physical Exam  BP (!) 120/57 (BP Location: Right Arm)   Pulse 90   Temp 97.9 F (36.6 C) (Oral)   Resp 20   SpO2 93%  Gen:   Awake, no distress   Resp:  Normal effort  MSK:   Moves extremities without difficulty  Other:    Medical Decision Making  Medically screening exam initiated at 12:53 PM.  Appropriate orders placed.  Shawn Lindsey was informed that the remainder of the evaluation will be completed by another provider, this initial triage assessment does not replace that evaluation, and the importance of remaining in the ED until their evaluation is complete.  Currently on 3l via [place] as per home O2. Shortness of breath no worse than usual per patient report.   Victorino Dike, FNP 02/28/22 1300

## 2022-03-01 ENCOUNTER — Telehealth: Payer: Self-pay | Admitting: *Deleted

## 2022-03-01 ENCOUNTER — Encounter: Payer: Self-pay | Admitting: Oncology

## 2022-03-01 ENCOUNTER — Other Ambulatory Visit: Payer: Self-pay

## 2022-03-01 ENCOUNTER — Other Ambulatory Visit: Payer: Self-pay | Admitting: Radiology

## 2022-03-01 ENCOUNTER — Inpatient Hospital Stay: Payer: PPO

## 2022-03-01 ENCOUNTER — Ambulatory Visit
Admission: RE | Admit: 2022-03-01 | Discharge: 2022-03-01 | Disposition: A | Discharge status: Home / Self Care | Payer: PPO | Source: Ambulatory Visit | Attending: Radiation Oncology | Admitting: Radiation Oncology

## 2022-03-01 ENCOUNTER — Ambulatory Visit
Admission: RE | Admit: 2022-03-01 | Discharge: 2022-03-01 | Disposition: A | Discharge status: Home / Self Care | Payer: PPO | Source: Ambulatory Visit | Attending: Radiology | Admitting: Radiology

## 2022-03-01 ENCOUNTER — Other Ambulatory Visit: Payer: Self-pay | Admitting: Pulmonary Disease

## 2022-03-01 ENCOUNTER — Ambulatory Visit
Admission: RE | Admit: 2022-03-01 | Discharge: 2022-03-01 | Disposition: A | Discharge status: Home / Self Care | Payer: PPO | Source: Ambulatory Visit | Attending: Hospice and Palliative Medicine | Admitting: Hospice and Palliative Medicine

## 2022-03-01 ENCOUNTER — Inpatient Hospital Stay (HOSPITAL_BASED_OUTPATIENT_CLINIC_OR_DEPARTMENT_OTHER): Payer: PPO | Admitting: Hospice and Palliative Medicine

## 2022-03-01 VITALS — BP 129/85 | HR 106 | Temp 96.8°F | Resp 24 | Ht 70.0 in | Wt 303.0 lb

## 2022-03-01 DIAGNOSIS — C3411 Malignant neoplasm of upper lobe, right bronchus or lung: Secondary | ICD-10-CM | POA: Diagnosis not present

## 2022-03-01 DIAGNOSIS — C3491 Malignant neoplasm of unspecified part of right bronchus or lung: Secondary | ICD-10-CM

## 2022-03-01 DIAGNOSIS — R0602 Shortness of breath: Secondary | ICD-10-CM

## 2022-03-01 DIAGNOSIS — T07XXXA Unspecified multiple injuries, initial encounter: Secondary | ICD-10-CM | POA: Diagnosis not present

## 2022-03-01 DIAGNOSIS — F17210 Nicotine dependence, cigarettes, uncomplicated: Secondary | ICD-10-CM | POA: Diagnosis not present

## 2022-03-01 DIAGNOSIS — J90 Pleural effusion, not elsewhere classified: Secondary | ICD-10-CM | POA: Insufficient documentation

## 2022-03-01 DIAGNOSIS — Z510 Encounter for antineoplastic radiation therapy: Secondary | ICD-10-CM | POA: Diagnosis not present

## 2022-03-01 DIAGNOSIS — Z9889 Other specified postprocedural states: Secondary | ICD-10-CM | POA: Insufficient documentation

## 2022-03-01 DIAGNOSIS — Z85818 Personal history of malignant neoplasm of other sites of lip, oral cavity, and pharynx: Secondary | ICD-10-CM | POA: Diagnosis not present

## 2022-03-01 LAB — COMPREHENSIVE METABOLIC PANEL
ALT: 56 U/L — ABNORMAL HIGH (ref 0–44)
AST: 44 U/L — ABNORMAL HIGH (ref 15–41)
Albumin: 3.7 g/dL (ref 3.5–5.0)
Alkaline Phosphatase: 94 U/L (ref 38–126)
Anion gap: 11 (ref 5–15)
BUN: 24 mg/dL — ABNORMAL HIGH (ref 8–23)
CO2: 28 mmol/L (ref 22–32)
Calcium: 9.4 mg/dL (ref 8.9–10.3)
Chloride: 99 mmol/L (ref 98–111)
Creatinine, Ser: 1.44 mg/dL — ABNORMAL HIGH (ref 0.61–1.24)
GFR, Estimated: 52 mL/min — ABNORMAL LOW (ref >60)
Glucose, Bld: 107 mg/dL — ABNORMAL HIGH (ref 70–99)
Potassium: 5.1 mmol/L (ref 3.5–5.1)
Sodium: 138 mmol/L (ref 135–145)
Total Bilirubin: 0.6 mg/dL (ref 0.3–1.2)
Total Protein: 7.3 g/dL (ref 6.5–8.1)

## 2022-03-01 LAB — CBC WITH DIFFERENTIAL/PLATELET
Abs Immature Granulocytes: 0.09 10*3/uL — ABNORMAL HIGH (ref 0.00–0.07)
Basophils Absolute: 0 10*3/uL (ref 0.0–0.1)
Basophils Relative: 0 %
Eosinophils Absolute: 0 10*3/uL (ref 0.0–0.5)
Eosinophils Relative: 0 %
HCT: 43.8 % (ref 39.0–52.0)
Hemoglobin: 13.8 g/dL (ref 13.0–17.0)
Immature Granulocytes: 1 %
Lymphocytes Relative: 4 %
Lymphs Abs: 0.8 10*3/uL (ref 0.7–4.0)
MCH: 29.5 pg (ref 26.0–34.0)
MCHC: 31.5 g/dL (ref 30.0–36.0)
MCV: 93.6 fL (ref 80.0–100.0)
Monocytes Absolute: 1.2 10*3/uL — ABNORMAL HIGH (ref 0.1–1.0)
Monocytes Relative: 7 %
Neutro Abs: 15.4 10*3/uL — ABNORMAL HIGH (ref 1.7–7.7)
Neutrophils Relative %: 88 %
Platelets: 244 10*3/uL (ref 150–400)
RBC: 4.68 MIL/uL (ref 4.22–5.81)
RDW: 16.3 % — ABNORMAL HIGH (ref 11.5–15.5)
WBC: 17.5 10*3/uL — ABNORMAL HIGH (ref 4.0–10.5)
nRBC: 0 % (ref 0.0–0.2)

## 2022-03-01 LAB — BRAIN NATRIURETIC PEPTIDE: B Natriuretic Peptide: 225.9 pg/mL — ABNORMAL HIGH (ref 0.0–100.0)

## 2022-03-01 LAB — RAD ONC ARIA SESSION SUMMARY
Course Elapsed Days: 0
Plan Fractions Treated to Date: 1
Plan Prescribed Dose Per Fraction: 2 Gy
Plan Total Fractions Prescribed: 20
Plan Total Prescribed Dose: 40 Gy
Reference Point Dosage Given to Date: 2 Gy
Reference Point Session Dosage Given: 2 Gy
Session Number: 1

## 2022-03-01 MED ORDER — LIDOCAINE-EPINEPHRINE 1 %-1:100000 IJ SOLN: 10.0000 mL | Freq: Once | INTRAMUSCULAR | Status: DC

## 2022-03-01 MED ORDER — LIDOCAINE HCL (PF) 1 % IJ SOLN
10.0000 mL | Freq: Once | INTRAMUSCULAR | Status: AC
  Administered 2022-03-01: 10 mL via SUBCUTANEOUS

## 2022-03-01 NOTE — Procedures (Signed)
Ultrasound-guided therapeutic right sided thoracentesis performed yielding 2.1 liters of straw colored fluid. No immediate complications. Follow-up chest x-ray pending. EBL is < 2 ml.

## 2022-03-01 NOTE — Progress Notes (Signed)
Met with patient during follow up visit with Dr. Grayland Ormond prior to starting chemotherapy treatments today. All questions answered during visit. Assisted pt with dressing change to abdominal wound. Old dressing was removed for MD to assess wound. New dressing applied which is clean, dry, and intact. Encouraged pt to keep up with dressing changes to ensure wound is clean and dry to help prevent infection while receiving chemotherapy. Contact info given to patient and instructed to call with any questions or needs. Pt verbalized understanding.
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
## 2022-03-01 NOTE — Telephone Encounter (Signed)
Call from patient son Marcelina Morel who lives out of town requesting a return call for more info on patient diagnosed and prognosis so that he can help patient and his girlfriend with decision making for his care whether it is hospice or treatment. He is not on patient HIPPA form.
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
## 2022-03-02 ENCOUNTER — Other Ambulatory Visit: Payer: Self-pay

## 2022-03-02 ENCOUNTER — Ambulatory Visit
Admission: RE | Admit: 2022-03-02 | Discharge: 2022-03-02 | Disposition: A | Discharge status: Home / Self Care | Payer: PPO | Source: Ambulatory Visit | Attending: Radiation Oncology | Admitting: Radiation Oncology

## 2022-03-02 ENCOUNTER — Telehealth: Payer: Self-pay | Admitting: *Deleted

## 2022-03-02 DIAGNOSIS — Z510 Encounter for antineoplastic radiation therapy: Secondary | ICD-10-CM | POA: Diagnosis not present

## 2022-03-02 DIAGNOSIS — R0602 Shortness of breath: Secondary | ICD-10-CM | POA: Insufficient documentation

## 2022-03-02 DIAGNOSIS — C3411 Malignant neoplasm of upper lobe, right bronchus or lung: Secondary | ICD-10-CM | POA: Insufficient documentation

## 2022-03-02 DIAGNOSIS — Z85818 Personal history of malignant neoplasm of other sites of lip, oral cavity, and pharynx: Secondary | ICD-10-CM | POA: Diagnosis not present

## 2022-03-02 DIAGNOSIS — F17210 Nicotine dependence, cigarettes, uncomplicated: Secondary | ICD-10-CM | POA: Diagnosis not present

## 2022-03-02 LAB — RAD ONC ARIA SESSION SUMMARY
Course Elapsed Days: 1
Plan Fractions Treated to Date: 2
Plan Prescribed Dose Per Fraction: 2 Gy
Plan Total Fractions Prescribed: 20
Plan Total Prescribed Dose: 40 Gy
Reference Point Dosage Given to Date: 4 Gy
Reference Point Session Dosage Given: 2 Gy
Session Number: 2

## 2022-03-02 NOTE — Telephone Encounter (Addendum)
Shawn Lindsey in radiology asked me to reach out to patient to see if he is willing to do his dye study on the port at 2pm tomorrow to IR schedules.  I spoke with patient and his significant other, Shawn Lindsey. Pt agreeable to [facility] by 2pm for the dye study after his radiation therapy apt. Tomorrow.

## 2022-03-03 ENCOUNTER — Other Ambulatory Visit: Payer: PPO | Admitting: Primary Care

## 2022-03-03 ENCOUNTER — Ambulatory Visit: Payer: PPO | Admitting: Oncology

## 2022-03-03 ENCOUNTER — Telehealth: Payer: Self-pay | Admitting: *Deleted

## 2022-03-03 ENCOUNTER — Other Ambulatory Visit: Payer: PPO

## 2022-03-03 ENCOUNTER — Telehealth: Payer: Self-pay | Admitting: Hospice and Palliative Medicine

## 2022-03-03 ENCOUNTER — Ambulatory Visit: Payer: PPO

## 2022-03-03 ENCOUNTER — Ambulatory Visit
Admission: RE | Admit: 2022-03-03 | Discharge: 2022-03-03 | Disposition: A | Discharge status: Home / Self Care | Payer: PPO | Source: Ambulatory Visit | Attending: Oncology | Admitting: Oncology

## 2022-03-03 DIAGNOSIS — Z515 Encounter for palliative care: Secondary | ICD-10-CM

## 2022-03-03 DIAGNOSIS — C3491 Malignant neoplasm of unspecified part of right bronchus or lung: Secondary | ICD-10-CM | POA: Diagnosis not present

## 2022-03-03 NOTE — Telephone Encounter (Signed)
Called patient to assess his condition he states that he "feel like he has a virus". Writer offered to try and get him an appointment with symptom management but he declined. Writer encouraged patient to be seen at urgent care if his condition worsens.Patient verbalized understanding.

## 2022-03-03 NOTE — Telephone Encounter (Signed)
I personally reached out to [facility], patient's significant other, to discuss patient's symptoms. Patient also available at residence at the time of this phone call. Patient has had multiple loose stools and he has not taken any OTC medications (imodium) for this. I have encouraged pt to take imodium AD now 4 milligrams (mg) (2 capsules) after the first loose bowel movement, then 2 mg (1 capsule) after each loose bowel movement after the first dose has been taken if diarrhea does not resolve. [place] apts were offered today in the clinic, patient adequately refusing any apts today. Also wants to cnl his dye study today as well. IR dept messaged re: pt's request to cnl the dye study.

## 2022-03-03 NOTE — Progress Notes (Signed)
[facility] Consult Note Telephone: [phone]  Fax: [phone]    Date of encounter: 03/03/22 PATIENT NAME: Shawn Lindsey [address] [place] [address]   [phone] (home)  DOB: [birth year] MRN: [id number] PRIMARY CARE PROVIDER:    Leonel Ramsay, MD,  [place] [address] [phone]  REFERRING PROVIDER:   Leonel Ramsay, MD [place],  [place] [address] [phone]  RESPONSIBLE PARTY:    Contact Information     Name Relation Home Work Mobile   Shawn Lindsey Significant other   [phone]   Shawn Lindsey, Shawn Lindsey [phone]    Shawn Lindsey, Shawn Lindsey   [phone]         Due to the COVID-19 crisis, this visit was done via telemedicine from my office and it was initiated and consent by this patient and or family.  I connected with  Shawn Lindsey OR PROXY on 03/03/22 by an audio enabled telemedicine application and verified that I am speaking with the correct person using two identifiers.   I discussed the limitations of evaluation and management by telemedicine. The patient expressed understanding and agreed to proceed. Palliative Care was asked to follow this patient by consultation request of  Borders, Kirt Boys, NP  to address advance care planning.                                   ASSESSMENT AND PLAN / RECOMMENDATIONS:   Advance Care Planning/Goals of Care: Goals include to maximize quality of life and symptom management. Patient/health care surrogate gave his/her permission to discuss.Our advance care planning conversation included a discussion about:    The value and importance of advance care planning Exploration of personal, cultural or spiritual beliefs that might influence medical decisions  Exploration of goals of care in the event of a sudden injury or illness  Identification of a healthcare agent - self  Spoke with SO and son today  who called for information about palliative vs hospice. Patient has had continuing decline and today is more SOB. Had thoracentesis 2 days ago. CODE STATUS: FULL Spoke with son and SO re recent decline in function. Patient has felt badly and declined to go to several recent appointments. SO feels he's dehydrated and he is more SOB. Extensive discussion RE hospice and services, vs palliative which would involve presenting to ED if he is ill. Recently went to ED and left due to wait. We discussed appropriate level of care in the face of physical decline. Son will discuss with patient if he'd like a hospice education visit today. We are able to send information visit today if desired. Follow up Palliative Care Visit: Palliative care will continue to follow for complex medical decision making, advance care planning, and clarification of goals. Return <1 weeks or prn.  I spent 30 minutes providing this consultation. More than 50% of the time in this consultation was spent in counseling and care coordination.  Thank you for the opportunity to participate in the care of Mr. Barris.  The palliative care team will continue to follow. Please call our office at [phone] if we can be of additional assistance.   Jason Coop, NP ,   COVID-19 PATIENT SCREENING TOOL Asked and negative response unless otherwise noted:   Have you had symptoms of covid, tested positive or been in contact with someone with symptoms/positive test  in the past 5-10 days?

## 2022-03-03 NOTE — Telephone Encounter (Signed)
I spoke with patient's son, Marcelina Morel.  He lives in [place] but is in frequent communication with both patient and patient's significant other.  He spoke with patient this morning who felt like he was not going to be able to come in today for treatment.  Son feels like patient is declining and asked if it was time to consider palliative care versus hospice.  He was interested in palliative care specifically through [facility] and I agreed to send that referral.  We talked about the possibility that patient could be nearing end-of-life.  We also discussed the importance of considering end-of-life decision-making such as DNR/DNI.  Son plans to speak with his father about decision-making.

## 2022-03-03 NOTE — Progress Notes (Deleted)
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 exposure control, adjustment of the mA and/or kV according to patient size and/or use of iterative reconstruction technique. CONTRAST:  15mL OMNIPAQUE IOHEXOL 350 MG/ML SOLN COMPARISON:  Current chest radiograph.  Chest CT, 12/21/2021. FINDINGS: Cardiovascular: Pulmonary arteries are well opacified. Respiratory motion mildly limits assessment of the smaller segmental/subsegmental vessels, particularly to the left lower lobe. Allowing for this, there is no evidence of a pulmonary embolism. Heart is normal in size. Left and circumflex coronary artery calcifications. No pericardial effusion. Aorta normal in caliber. Aortic atherosclerosis. No dissection. Mediastinum/Nodes: Mild mediastinal adenopathy. Precarinal node measures 1.7 cm short axis. Subcarinal node measures 1.7 cm in short axis. Enlarged left hilar node, 1.4 cm in short axis. No mediastinal or hilar masses. Trachea is partly collapse, exam appears extra tori. Trachea otherwise unremarkable. Normal esophagus. Lungs/Pleura: Large moderate right pleural effusion. Right upper lobe bronchus is occluded. There is surrounding  soft tissue, contiguous with the partly, which could reflect a mass, approximately 4 x 3.4 cm transversely. There is patchy airspace consolidation in the right lower lobe collapsed right upper lobe. Right middle lobe is collapsed. Additional patchy opacities noted in the right upper lobe. Patchy areas of ground-glass opacity are noted in the left lower lobe and anteromedial left upper lobe. 8 mm nodule, subpleural, left lower lobe, image 114/5. Moderate centrilobular emphysema. No left pleural effusion. No pneumothorax. Upper Abdomen: No acute findings. No visualized liver or adrenal mass. Dependent gallstone. Musculoskeletal: No fracture or acute finding. No bone lesion. No chest wall mass. Review of the MIP images confirms the above findings. IMPRESSION: 1. No evidence of a pulmonary embolism. 2. Possible central mass versus collapsed lung along the superior right hilum, where the right upper lobe bronchus is occluded. 3. Patchy areas of right upper and lower lobe opacity consistent with multifocal pneumonia. 4. Moderate right pleural effusion. 5. Patchy ground-glass type opacities in the left upper and left lower lobe may reflect additional areas of infection, but are similar to the prior CT, and may be chronic. 6. 8 mm left lower lobe nodule. 7. Mediastinal and left hilar lymphadenopathy unchanged from the prior CT. 8. Coronary artery calcifications and aortic atherosclerosis. Aortic Atherosclerosis (ICD10-I70.0) and Emphysema (ICD10-J43.9). Electronically Signed   By: Lajean Manes M.D.   On: 02/05/2022 16:41   DG Chest Portable 1 View  Result Date: 02/05/2022 CLINICAL DATA:  Increasing shortness of breath EXAM: PORTABLE CHEST 1 VIEW COMPARISON:  Previous chest radiographs done on 08/02/2021 and CT chest done on 12/21/2021 FINDINGS: Transverse diameter of heart is increased. There is slight prominence of interstitial markings in left lung. There is decreased volume in right lung. There is pleural density in  the periphery of right lung. There is homogeneous opacity in right upper and right lower lung fields. Left lateral CP angle is clear. There is no pneumothorax. IMPRESSION: There is interval decrease in volume in right lung along with increasing amount of right pleural effusion. Part of the increased opacity in right lung may be due to underlying neoplastic process and pneumonia. Prominence of interstitial markings in left lung may suggest interstitial edema or interstitial pneumonia. Electronically Signed   By: Elmer Picker M.D.   On: 02/05/2022 14:57    ASSESSMENT: Stage IV adenocarcinoma of the lung.  PLAN:    Stage IV adenocarcinoma of the lung: Of note, mass has been present since at least 2017, but multiple biopsies interim have been negative for malignancy.  Confirmed by positive malignancy and pleural fluid.  Patient also  had a PET scan on January 12, 2022 which revealed an FDG avid large right upper lobe lung mass with subcarinal and paratracheal nodal enlargement.  CT scan of the head on February 11, 2022 did not report any metastatic disease.  Although patient is stage IV, will treat as a stage IIIb with plans to give daily XRT along with weekly carboplatin and Taxol followed by maintenance immunotherapy.  Patient has now had port placement.  Patient ultimately did not receive cycle 1 of treatment today secondary to increased wheezing and was transferred to the emergency room.  Day 1 has been rescheduled for Thursday.  Daily XRT starts on March 01, 2022. Leukocytosis: Likely reactive. Shortness of breath: Chronic and unchanged.  Continue oxygen 24 hours/day.   Shoulder pain: Likely secondary to underlying malignancy.  Continue oxycodone as prescribed. Cough.  Patient was given a prescription for [employer] Burns: Monitor closely while receiving chemotherapy.  Patient has been instructed not to smoke while using his oxygen. Increased wheezing: Likely secondary to COPD.  Patient  was transferred to the ED for evaluation and treatment.   Patient expressed understanding and was in agreement with this plan. He also understands that He can call clinic at any time with any questions, concerns, or complaints.    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

## 2022-03-03 NOTE — Telephone Encounter (Signed)
Son left message that patient is not doing well and does not think he will be able to come to radiation therapy today due to pain and "losing control of his bowels. Please advise

## 2022-03-04 ENCOUNTER — Ambulatory Visit: Payer: PPO

## 2022-03-04 ENCOUNTER — Inpatient Hospital Stay: Payer: PPO | Admitting: Oncology

## 2022-03-04 ENCOUNTER — Inpatient Hospital Stay: Payer: PPO

## 2022-03-07 ENCOUNTER — Other Ambulatory Visit: Payer: PPO

## 2022-03-07 ENCOUNTER — Ambulatory Visit: Payer: PPO

## 2022-03-07 ENCOUNTER — Ambulatory Visit: Payer: PPO | Admitting: Oncology

## 2022-03-07 ENCOUNTER — Telehealth: Payer: Self-pay | Admitting: *Deleted

## 2022-03-07 NOTE — Telephone Encounter (Signed)
Per Cecille Rubin in radiation, patient has elected for hospice services and has requested all apts to be cnl in cancer center.

## 2022-03-08 ENCOUNTER — Ambulatory Visit: Payer: PPO

## 2022-03-09 ENCOUNTER — Ambulatory Visit: Payer: PPO

## 2022-03-09 ENCOUNTER — Telehealth: Payer: Self-pay | Admitting: *Deleted

## 2022-03-09 ENCOUNTER — Telehealth: Payer: PPO | Admitting: Hospice and Palliative Medicine

## 2022-03-09 ENCOUNTER — Other Ambulatory Visit: Payer: Self-pay | Admitting: Hospice and Palliative Medicine

## 2022-03-09 LAB — FUNGUS CULTURE WITH STAIN

## 2022-03-09 LAB — FUNGUS CULTURE RESULT

## 2022-03-09 LAB — FUNGAL ORGANISM REFLEX

## 2022-03-09 MED ORDER — MORPHINE SULFATE (CONCENTRATE) 10 MG /0.5 ML PO SOLN: 5.0000 mg | ORAL | 0 refills | Status: AC | PRN

## 2022-03-09 NOTE — Progress Notes (Signed)
I spoke with hospice nurse, Davanea.She requested orders for comfort meds morphine and lorazepam. She will call in verbal rx for lorazepam. Rx for morphine sent to pharmacy.

## 2022-03-09 NOTE — Telephone Encounter (Signed)
Outcome with covermymeds: "We received a Quantity Limit request for lorazepam 0.5 mg oral tablet. When used for a medically accepted indication (approved medical condition), the Plan covers the requested drug at 90 tablets per 30 days. Therefore, the request has been dismiss"

## 2022-03-09 NOTE — Telephone Encounter (Signed)
Shawn Lindsey Shawn Lindsey Signs - PA Case ID: [id number] - Rx #: [id number] Received notification from covermymeds that pt needed pa for ativan. Per Merrily Pew, NP- hospice requested a verbal order for the ativan last evening for patient. Script PA submitted for covermymeds. Pending insurance approval.

## 2022-03-10 ENCOUNTER — Ambulatory Visit: Payer: PPO | Admitting: Oncology

## 2022-03-10 ENCOUNTER — Ambulatory Visit: Payer: PPO

## 2022-03-10 ENCOUNTER — Encounter: Payer: Self-pay | Admitting: *Deleted

## 2022-03-10 ENCOUNTER — Other Ambulatory Visit: Payer: PPO

## 2022-03-11 ENCOUNTER — Ambulatory Visit: Payer: PPO

## 2022-03-11 ENCOUNTER — Telehealth: Payer: Self-pay | Admitting: *Deleted

## 2022-03-14 ENCOUNTER — Ambulatory Visit: Payer: PPO | Admitting: Oncology

## 2022-03-14 ENCOUNTER — Ambulatory Visit: Payer: PPO

## 2022-03-14 ENCOUNTER — Other Ambulatory Visit: Payer: PPO

## 2022-03-15 ENCOUNTER — Ambulatory Visit: Payer: PPO

## 2022-03-16 ENCOUNTER — Ambulatory Visit: Payer: PPO

## 2022-03-17 ENCOUNTER — Ambulatory Visit: Payer: PPO

## 2022-03-17 ENCOUNTER — Ambulatory Visit: Payer: PPO | Admitting: Physician Assistant

## 2022-03-18 ENCOUNTER — Ambulatory Visit: Payer: PPO

## 2022-03-18 ENCOUNTER — Ambulatory Visit: Payer: PPO | Admitting: Oncology

## 2022-03-18 ENCOUNTER — Other Ambulatory Visit: Payer: PPO

## 2022-03-21 ENCOUNTER — Ambulatory Visit: Payer: PPO

## 2022-03-21 ENCOUNTER — Ambulatory Visit: Payer: PPO | Admitting: Pulmonary Disease

## 2022-03-22 ENCOUNTER — Ambulatory Visit: Payer: PPO

## 2022-03-23 ENCOUNTER — Ambulatory Visit: Payer: PPO

## 2022-03-24 LAB — ACID FAST CULTURE WITH REFLEXED SENSITIVITIES (MYCOBACTERIA): Acid Fast Culture: NEGATIVE

## 2022-03-28 ENCOUNTER — Ambulatory Visit: Payer: PPO

## 2022-03-29 ENCOUNTER — Ambulatory Visit: Payer: PPO

## 2022-03-30 ENCOUNTER — Ambulatory Visit: Payer: PPO

## 2022-03-31 ENCOUNTER — Ambulatory Visit: Payer: PPO

## 2022-04-01 ENCOUNTER — Ambulatory Visit: Payer: PPO

## 2022-04-01 NOTE — Telephone Encounter (Signed)
Call from [place] me that patient expired this morning around 7 AM

## 2022-04-01 NOTE — Telephone Encounter (Signed)
Per Seth Bake with Hospice, patient passed at 8:13 AM this morning

## 2022-04-01 DEATH — deceased

## 2022-04-04 ENCOUNTER — Ambulatory Visit: Payer: PPO | Admitting: Oncology

## 2022-04-04 ENCOUNTER — Ambulatory Visit: Payer: PPO

## 2022-04-04 ENCOUNTER — Other Ambulatory Visit: Payer: PPO
